# Patient Record
Sex: Female | Born: 1935 | Race: White | Hispanic: No | Smoking: Former smoker
Health system: Southern US, Community
[De-identification: ages and names within clinical notes are randomized; demographics above are authoritative.]

## PROBLEM LIST (undated history)

## (undated) DIAGNOSIS — T7840XA Allergy, unspecified, initial encounter: Secondary | ICD-10-CM

## (undated) DIAGNOSIS — M1990 Unspecified osteoarthritis, unspecified site: Secondary | ICD-10-CM

## (undated) DIAGNOSIS — H3530 Unspecified macular degeneration: Secondary | ICD-10-CM

## (undated) DIAGNOSIS — Z87440 Personal history of urinary (tract) infections: Secondary | ICD-10-CM

## (undated) DIAGNOSIS — I10 Essential (primary) hypertension: Secondary | ICD-10-CM

## (undated) DIAGNOSIS — Z8619 Personal history of other infectious and parasitic diseases: Secondary | ICD-10-CM

## (undated) DIAGNOSIS — E079 Disorder of thyroid, unspecified: Secondary | ICD-10-CM

## (undated) DIAGNOSIS — H409 Unspecified glaucoma: Secondary | ICD-10-CM

## (undated) DIAGNOSIS — M4800 Spinal stenosis, site unspecified: Secondary | ICD-10-CM

## (undated) HISTORY — DX: Unspecified osteoarthritis, unspecified site: M19.90

## (undated) HISTORY — DX: Personal history of other infectious and parasitic diseases: Z86.19

## (undated) HISTORY — DX: Personal history of urinary (tract) infections: Z87.440

## (undated) HISTORY — DX: Unspecified glaucoma: H40.9

## (undated) HISTORY — DX: Unspecified macular degeneration: H35.30

## (undated) HISTORY — DX: Disorder of thyroid, unspecified: E07.9

## (undated) HISTORY — DX: Allergy, unspecified, initial encounter: T78.40XA

## (undated) HISTORY — PX: BILATERAL CARPAL TUNNEL RELEASE: SHX6508

---

## 2011-01-12 HISTORY — PX: CATARACT EXTRACTION, BILATERAL: SHX1313

## 2014-06-24 ENCOUNTER — Encounter: Payer: Self-pay | Admitting: Podiatry

## 2014-06-24 ENCOUNTER — Ambulatory Visit (INDEPENDENT_AMBULATORY_CARE_PROVIDER_SITE_OTHER): Payer: PPO | Admitting: Podiatry

## 2014-06-24 ENCOUNTER — Ambulatory Visit (INDEPENDENT_AMBULATORY_CARE_PROVIDER_SITE_OTHER): Payer: PPO

## 2014-06-24 VITALS — BP 137/72 | HR 63 | Resp 16 | Ht 60.0 in | Wt 105.0 lb

## 2014-06-24 DIAGNOSIS — M79672 Pain in left foot: Secondary | ICD-10-CM | POA: Diagnosis not present

## 2014-06-24 DIAGNOSIS — G629 Polyneuropathy, unspecified: Secondary | ICD-10-CM | POA: Diagnosis not present

## 2014-06-24 NOTE — Progress Notes (Signed)
   Subjective:    Patient ID: Gloria Carr, female    DOB: [birth year], 81 y.o.   MRN: [id number] Pt is having discomfort that is on the bottom of her feet; pt said she feel like she is "walking in mud at times" she has insoles and they are not comfortable to wear- they came from her doc., back home.   , she has been seen by a podiatries before she moved , she is just getting settled in here , [place].    HPI    Review of Systems  HENT: Positive for sneezing and trouble swallowing.   Eyes: Positive for itching and visual disturbance.  Endocrine: Positive for cold intolerance and polyuria.  Genitourinary: Positive for urgency and frequency.  Allergic/Immunologic: Positive for environmental allergies.  Neurological: Positive for numbness.  Hematological: Bruises/bleeds easily.  All other systems reviewed and are negative.      Objective:   Physical Exam: I have reviewed her past medical history medications allergies surgery social history and review of systems. Pulses are palpable bilateral. Neurologic sensorium is slightly decreased per Semmes-Weinstein monofilament. Deep tendon reflexes and muscle strength are equal bilateral symmetrical. Orthopedic evaluation of strength on the systems of ankle range of motion without crepitation.        Assessment & Plan:  Assessment: Idiopathic neuropathy bilateral.  Plan: Discussed etiology and pathology conservative versus surgical therapies. Discussed the use of medical vitamins such as NeurRx. We'll follow-up with her as needed.

## 2014-06-25 DIAGNOSIS — M79673 Pain in unspecified foot: Secondary | ICD-10-CM

## 2014-07-16 ENCOUNTER — Encounter: Payer: Self-pay | Admitting: Internal Medicine

## 2014-07-16 ENCOUNTER — Ambulatory Visit (INDEPENDENT_AMBULATORY_CARE_PROVIDER_SITE_OTHER): Payer: PPO | Admitting: Internal Medicine

## 2014-07-16 VITALS — BP 128/61 | HR 59 | Temp 98.3°F | Ht 58.75 in | Wt 107.0 lb

## 2014-07-16 DIAGNOSIS — E034 Atrophy of thyroid (acquired): Secondary | ICD-10-CM | POA: Diagnosis not present

## 2014-07-16 DIAGNOSIS — E038 Other specified hypothyroidism: Secondary | ICD-10-CM | POA: Diagnosis not present

## 2014-07-16 DIAGNOSIS — E785 Hyperlipidemia, unspecified: Secondary | ICD-10-CM

## 2014-07-16 DIAGNOSIS — G609 Hereditary and idiopathic neuropathy, unspecified: Secondary | ICD-10-CM | POA: Insufficient documentation

## 2014-07-16 DIAGNOSIS — E039 Hypothyroidism, unspecified: Secondary | ICD-10-CM | POA: Insufficient documentation

## 2014-07-16 NOTE — Patient Instructions (Addendum)
We are checking your thyroid, B12 etc today and wil refilll your Synthroid and pravastatin once I see the results   I  recommend getting the majority of your calcium and Vitamin D  through diet rather than supplements given the recent association of calcium supplements with increased coronary artery calcium scores  Try the almond and cashew milks that most grocery stores  now carry  in the dairy  Section.    They are lactose free:  Silk brand Almond Light,  Original formula.  Delicious,  Low carb, Low cal,  Cholesterol free   I recommend trying the following protein drinks to supplement your diet :  Muscle Milk Premier Protein  Orvan Seen

## 2014-07-16 NOTE — Progress Notes (Signed)
Pre visit review using our clinic review tool, if applicable. No additional management support is needed unless otherwise documented below in the visit note. 

## 2014-07-16 NOTE — Progress Notes (Signed)
Subjective:  Patient ID: Gloria Carr, female    DOB: 1927/10/10  Age: 81 y.o. MRN: [id number]  CC: The primary encounter diagnosis was Hypothyroidism due to acquired atrophy of thyroid. Diagnoses of Hereditary and idiopathic peripheral neuropathy and Hyperlipidemia were also pertinent to this visit.  HPI Gloria Carr presents for new patient care  Cc:  insomnia now improved,   One nightttime void,  Some urgency associated with voiding but no incontinence.   She exercises regularly with line dancing, has been doing it for years,   Saw [facility] for idiopthic neuropathy which started August 2014.  Marland Kitchen  Feels like walking in mud.  Prior podiatry tried gabapentin with no improvement,   Hyatt prescirbed NeurRX vitmain    PMH:   History of several  spontaneous abortions.   Carried several to full time afterward   Hypothyroid for years  Cataract surgery with lenses bilaterally , followed by blurred vision left eye requiring laser surgery . Still blurry, now wearing prescription lenses    CTS release bilaterally, remotely  Right thumb surgery for painful arthritis , remotely.    Not sure which pneumonia shot was given,  Had zostavAX CAVVINE    hi  Relocated from [place].  2 months ago to [place],  [place] in [place]  Allergic rhinitis since relocation ,  Using clarinex and flonase  Has had eye exam with [place] at [facility]  Early macular degeneration bilaterally, glaucoma in left eye   History [name] has a past medical history of Arthritis; History of chickenpox; Glaucoma; Allergy; Thyroid disease; and History of recurrent UTIs.   She has past surgical history that includes Cataract extraction, bilateral (2013) and Bilateral carpal tunnel release (1999-2000).   Her family history is not on file.She reports that she has quit smoking. She has never used smokeless tobacco. She reports that she drinks about 3.0 oz of alcohol per week. Her drug history is not on  file.  Outpatient Prescriptions Prior to Visit  Medication Sig Dispense Refill  . Calcium Citrate (CITRACAL PO) Take by mouth.    . levothyroxine (SYNTHROID, LEVOTHROID) 75 MCG tablet Take 75 mcg by mouth daily before breakfast.    . Multiple Vitamins-Minerals (CENTRUM SILVER PO) Take by mouth.    . Multiple Vitamins-Minerals (PRESERVISION AREDS 2 PO) Take by mouth.    Vladimir Faster Glycol-Propyl Glycol (SYSTANE OP) Apply to eye.    . pravastatin (PRAVACHOL) 20 MG tablet Take 20 mg by mouth daily.     No facility-administered medications prior to visit.    Review of Systems:  Patient denies headache, fevers, malaise, unintentional weight loss, skin rash, eye pain, sinus congestion and sinus pain, sore throat, dysphagia,  hemoptysis , cough, dyspnea, wheezing, chest pain, palpitations, orthopnea, edema, abdominal pain, nausea, melena, diarrhea, constipation, flank pain, dysuria, hematuria, urinary  Frequency, nocturia, numbness, tingling, seizures,  Focal weakness, Loss of consciousness,  Tremor, insomnia, depression, anxiety, and suicidal ideation.     Objective:  BP 128/61 mmHg  Pulse 59  Temp(Src) 98.3 F (36.8 C) (Oral)  Ht 4' 10.75" (1.492 m)  Wt 107 lb (48.535 kg)  BMI 21.80 kg/m2  SpO2 95%  LMP  (LMP Unknown)  Physical Exam:  General appearance: alert, cooperative and appears stated age Ears: normal TM's and external ear canals both ears Throat: lips, mucosa, and tongue normal; teeth and gums normal Neck: no adenopathy, no carotid bruit, supple, symmetrical, trachea midline and thyroid not enlarged, symmetric, no tenderness/mass/nodules Back: symmetric, no curvature. ROM  normal. No CVA tenderness. Lungs: clear to auscultation bilaterally Heart: regular rate and rhythm, S1, S2 normal, no murmur, click, rub or gallop Abdomen: soft, non-tender; bowel sounds normal; no masses,  no organomegaly Pulses: 2+ and symmetric Skin: Skin color, texture, turgor normal. No rashes or  lesions Lymph nodes: Cervical, supraclavicular, and axillary nodes normal.  Assessment & Plan:   Problem List Items Addressed This Visit      Unprioritized   Hypothyroidism - Primary    Thyroid function is WNL on current dose.  No current changes needed.       Relevant Orders   TSH (Completed)   Hereditary and idiopathic peripheral neuropathy    No reversible causes identified by currnent labs. Continue current  management       Relevant Orders   Comprehensive metabolic panel (Completed)   Vitamin B12 (Completed)   RPR (Completed)   Hemoglobin A1c (Completed)   Hyperlipidemia    Managed with pravastatin.  Liver tests are normal.         A total of 45 minutes was spent with patient more than half of which was spent in counseling patient on the above mentioned issues , reviewing and explaining recent labs and imaging studies done, and coordination of care. I am having Ms. Nudd maintain her levothyroxine, pravastatin, Multiple Vitamins-Minerals (PRESERVISION AREDS 2 PO), Polyethyl Glycol-Propyl Glycol (SYSTANE OP), Multiple Vitamins-Minerals (CENTRUM SILVER PO), Calcium Citrate (CITRACAL PO), Misc Natural Products (OSTEO BI-FLEX JOINT SHIELD PO), loratadine, fluticasone, MISC NATURAL PRODUCTS PO, and latanoprost.  Meds ordered this encounter  Medications  . Misc Natural Products (OSTEO BI-FLEX JOINT SHIELD PO)    Sig: Take by mouth.  . loratadine (CLARITIN) 10 MG tablet    Sig: Take 10 mg by mouth daily.  . fluticasone (FLONASE) 50 MCG/ACT nasal spray    Sig: Place into both nostrils daily.  Marland Kitchen MISC NATURAL PRODUCTS PO    Sig: Take by mouth. Peripheral nerve health  . latanoprost (XALATAN) 0.005 % ophthalmic solution    Sig: INSTILL 1 DROP IN THE LEFT EYE AT NIGHT    Refill:  5    There are no discontinued medications.  Follow-up: No Follow-up on file.   Crecencio Mc, MD

## 2014-07-17 LAB — COMPREHENSIVE METABOLIC PANEL
ALT: 17 U/L (ref 0–35)
AST: 23 U/L (ref 0–37)
Albumin: 3.8 g/dL (ref 3.5–5.2)
Alkaline Phosphatase: 65 U/L (ref 39–117)
BUN: 14 mg/dL (ref 6–23)
CALCIUM: 9.5 mg/dL (ref 8.4–10.5)
CO2: 29 mEq/L (ref 19–32)
Chloride: 103 mEq/L (ref 96–112)
Creatinine, Ser: 0.8 mg/dL (ref 0.40–1.20)
GFR: 72.41 mL/min (ref >60.00)
Glucose, Bld: 82 mg/dL (ref 70–99)
Potassium: 4 mEq/L (ref 3.5–5.1)
SODIUM: 139 meq/L (ref 135–145)
Total Bilirubin: 0.5 mg/dL (ref 0.2–1.2)
Total Protein: 7.1 g/dL (ref 6.0–8.3)

## 2014-07-17 LAB — VITAMIN B12: Vitamin B-12: >1500 pg/mL — ABNORMAL HIGH (ref 211–911)

## 2014-07-17 LAB — TSH: TSH: 0.69 u[IU]/mL (ref 0.35–4.50)

## 2014-07-17 LAB — RPR

## 2014-07-17 LAB — HEMOGLOBIN A1C: Hgb A1c MFr Bld: 5.7 % (ref 4.6–6.5)

## 2014-07-18 ENCOUNTER — Encounter: Payer: Self-pay | Admitting: Internal Medicine

## 2014-07-18 NOTE — Assessment & Plan Note (Signed)
Thyroid function is WNL on current dose.  No current changes needed.  

## 2014-07-18 NOTE — Assessment & Plan Note (Signed)
No reversible causes identified by currnent labs. Continue current  management

## 2014-07-18 NOTE — Assessment & Plan Note (Signed)
Managed with pravastatin.  Liver tests are normal.

## 2014-07-19 ENCOUNTER — Telehealth: Payer: Self-pay | Admitting: *Deleted

## 2014-07-19 MED ORDER — LEVOTHYROXINE SODIUM 75 MCG PO TABS: 75.0000 ug | ORAL_TABLET | Freq: Every day | ORAL | Status: DC

## 2014-07-19 MED ORDER — PRAVASTATIN SODIUM 20 MG PO TABS: 20.0000 mg | ORAL_TABLET | Freq: Every day | ORAL | Status: DC

## 2014-07-19 NOTE — Telephone Encounter (Signed)
I spoke with the pt, she was did not know what medications were to be refilled.  She just thought she was told that two were to be sent to her pharmacy.

## 2014-07-19 NOTE — Telephone Encounter (Signed)
Pt called states she was to believe she was to have 2 Rxs sent to [facility] after her appoint on 7.5.16.  Review of chart I dont see any Rxs sent.  Please advise

## 2014-07-19 NOTE — Telephone Encounter (Signed)
In the future,  It would save me a lot of time if you would ask the patient which medications she was expecting.  I have refilled the levothyroxine and the pravastatin .  You will need to confirm that those were the ones she was expecting.   Thanks

## 2014-08-02 NOTE — Telephone Encounter (Signed)
Maid unread results to patient

## 2014-11-27 ENCOUNTER — Telehealth: Payer: Self-pay | Admitting: Internal Medicine

## 2014-11-27 NOTE — Telephone Encounter (Signed)
Left msg for pt to call office to schedule AWV.msn °

## 2014-12-10 ENCOUNTER — Ambulatory Visit (INDEPENDENT_AMBULATORY_CARE_PROVIDER_SITE_OTHER): Payer: PPO

## 2014-12-10 VITALS — BP 128/68 | HR 68 | Temp 98.0°F | Resp 14 | Ht 58.5 in | Wt 110.2 lb

## 2014-12-10 DIAGNOSIS — Z0000 Encounter for general adult medical examination without abnormal findings: Secondary | ICD-10-CM

## 2014-12-10 NOTE — Progress Notes (Signed)
  I have reviewed the above information and agree with above.   Strummer Canipe, MD 

## 2014-12-10 NOTE — Patient Instructions (Addendum)
Gloria Carr,  Thank you for taking time to come for your Medicare Wellness Visit.  I appreciate your ongoing commitment to your health goals. Please review the following plan we discussed and let me know if I can assist you in the future.  Bring a copy of Advanced Directives  Return in January for 6 month follow up Fall Prevention in the [facility] can cause injuries. They can happen to people of all ages. There are many things you can do to make your home safe and to help prevent falls.  WHAT CAN I DO ON THE OUTSIDE OF MY HOME?  Regularly fix the edges of walkways and driveways and fix any cracks.  Remove anything that might make you trip as you walk through a door, such as a raised step or threshold.  Trim any bushes or trees on the path to your home.  Use bright outdoor lighting.  Clear any walking paths of anything that might make someone trip, such as rocks or tools.  Regularly check to see if handrails are loose or broken. Make sure that both sides of any steps have handrails.  Any raised decks and porches should have guardrails on the edges.  Have any leaves, snow, or ice cleared regularly.  Use sand or salt on walking paths during winter.  Clean up any spills in your garage right away. This includes oil or grease spills. WHAT CAN I DO IN THE BATHROOM?   Use night lights.  Install grab bars by the toilet and in the tub and shower. Do not use towel bars as grab bars.  Use non-skid mats or decals in the tub or shower.  If you need to sit down in the shower, use a plastic, non-slip stool.  Keep the floor dry. Clean up any water that spills on the floor as soon as it happens.  Remove soap buildup in the tub or shower regularly.  Attach bath mats securely with double-sided non-slip rug tape.  Do not have throw rugs and other things on the floor that can make you trip. WHAT CAN I DO IN THE BEDROOM?  Use night lights.  Make sure that you have a light by your bed that  is easy to reach.  Do not use any sheets or blankets that are too big for your bed. They should not hang down onto the floor.  Have a firm chair that has side arms. You can use this for support while you get dressed.  Do not have throw rugs and other things on the floor that can make you trip. WHAT CAN I DO IN THE KITCHEN?  Clean up any spills right away.  Avoid walking on wet floors.  Keep items that you use a lot in easy-to-reach places.  If you need to reach something above you, use a strong step stool that has a grab bar.  Keep electrical cords out of the way.  Do not use floor polish or wax that makes floors slippery. If you must use wax, use non-skid floor wax.  Do not have throw rugs and other things on the floor that can make you trip. WHAT CAN I DO WITH MY STAIRS?  Do not leave any items on the stairs.  Make sure that there are handrails on both sides of the stairs and use them. Fix handrails that are broken or loose. Make sure that handrails are as long as the stairways.  Check any carpeting to make sure that it is firmly  attached to the stairs. Fix any carpet that is loose or worn.  Avoid having throw rugs at the top or bottom of the stairs. If you do have throw rugs, attach them to the floor with carpet tape.  Make sure that you have a light switch at the top of the stairs and the bottom of the stairs. If you do not have them, ask someone to add them for you. WHAT ELSE CAN I DO TO HELP PREVENT FALLS?  Wear shoes that:  Do not have high heels.  Have rubber bottoms.  Are comfortable and fit you well.  Are closed at the toe. Do not wear sandals.  If you use a stepladder:  Make sure that it is fully opened. Do not climb a closed stepladder.  Make sure that both sides of the stepladder are locked into place.  Ask someone to hold it for you, if possible.  Clearly mark and make sure that you can see:  Any grab bars or handrails.  First and last  steps.  Where the edge of each step is.  Use tools that help you move around (mobility aids) if they are needed. These include:  Canes.  Walkers.  Scooters.  Crutches.  Turn on the lights when you go into a dark area. Replace any light bulbs as soon as they burn out.  Set up your furniture so you have a clear path. Avoid moving your furniture around.  If any of your floors are uneven, fix them.  If there are any pets around you, be aware of where they are.  Review your medicines with your doctor. Some medicines can make you feel dizzy. This can increase your chance of falling. Ask your doctor what other things that you can do to help prevent falls.   This information is not intended to replace advice given to you by your health care provider. Make sure you discuss any questions you have with your health care provider.   Document Released: 10/24/2008 Document Revised: 05/14/2014 Document Reviewed: 02/01/2014 Elsevier Interactive Patient Education 2016 Elsevier Inc.  Glaucoma Glaucoma happens when the fluid pressure in the eyeball is too high. If the pressure stays high for too long, the eye may become damaged. This can cause a loss of vision. The most common type of glaucoma causes pressure in the eye to go up slowly. There may be no symptoms at first. Testing for this condition can help to find the condition before damage occurs. Early treatment can often stop vision loss. HOME CARE  Take medicines only as told by your doctor.  Use your eye drops exactly as told. You will probably need to use these for the rest of your life.  Exercise often. Talk with your doctor about which types of exercise are safe for you. Avoid standing on your head.  Keep all follow-up visits as told by your doctor. This is important. GET HELP IF:  Your symptoms get worse. GET HELP RIGHT AWAY IF:  You have bad pain in your eye.  You have vision problems.  You have a bad headache in the area  around your eye.  You feel sick to your stomach (nauseous) or you throw up (vomit).  You start to have problems with your other eye.   This information is not intended to replace advice given to you by your health care provider. Make sure you discuss any questions you have with your health care provider.   Document Released: 10/07/2007 Document Revised: 01/18/2014 Document  Reviewed: 10/09/2013 Elsevier Interactive Patient Education [employer].

## 2014-12-10 NOTE — Progress Notes (Signed)
Subjective:   Gloria Carr is a 81 y.o. female who presents for an Initial Medicare Annual Wellness Visit.  Review of Systems    No ROS.  Medicare Wellness Visit.   Cardiac Risk Factors include: advanced age (>22men, >43 women)     Objective:    Today's Vitals   12/10/14 1332  BP: 128/68  Pulse: 68  Temp: 98 F (36.7 C)  TempSrc: Oral  Resp: 14  Height: 4' 10.5" (1.486 m)  Weight: 110 lb 3.2 oz (49.986 kg)  SpO2: 97%    Current Medications (verified) Outpatient Encounter Prescriptions as of 12/10/2014  Medication Sig  . latanoprost (XALATAN) 0.005 % ophthalmic solution INSTILL 1 DROP IN THE LEFT EYE AT NIGHT  . levothyroxine (SYNTHROID, LEVOTHROID) 75 MCG tablet Take 1 tablet (75 mcg total) by mouth daily before breakfast.  . Misc Natural Products (OSTEO BI-FLEX JOINT SHIELD PO) Take by mouth.  . Multiple Vitamins-Minerals (CENTRUM SILVER PO) Take by mouth.  . Multiple Vitamins-Minerals (PRESERVISION AREDS 2 PO) Take by mouth.  Gloria Carr Glycol-Propyl Glycol (SYSTANE OP) Apply to eye.  . pravastatin (PRAVACHOL) 20 MG tablet Take 1 tablet (20 mg total) by mouth daily.  . Calcium Citrate (CITRACAL PO) Take by mouth.  . fluticasone (FLONASE) 50 MCG/ACT nasal spray Place into both nostrils daily.  Marland Kitchen loratadine (CLARITIN) 10 MG tablet Take 10 mg by mouth daily.  Marland Kitchen MISC NATURAL PRODUCTS PO Take by mouth. Peripheral nerve health   No facility-administered encounter medications on file as of 12/10/2014.    Allergies (verified) Sulfa antibiotics   History: Past Medical History  Diagnosis Date  . Arthritis   . History of chickenpox   . Glaucoma   . Allergy   . Thyroid disease   . History of recurrent UTIs    Past Surgical History  Procedure Laterality Date  . Cataract extraction, bilateral  2013  . Bilateral carpal tunnel release  1999-2000   Family History  Problem Relation Age of Onset  . Stroke Mother   . Cancer Mother     colon  . Cancer Father    bone  . Cancer Son     Jaw   Social History   Occupational History  . Not on file.   Social History Main Topics  . Smoking status: Former Research scientist (life sciences)  . Smokeless tobacco: Never Used  . Alcohol Use: 3.0 oz/week    5 Standard drinks or equivalent per week     Comment: with dinner  . Drug Use: No  . Sexual Activity: No    Tobacco Counseling Counseling given: Not Answered   Activities of Daily Living In your present state of health, do you have any difficulty performing the following activities: 12/10/2014  Hearing? N  Vision? N  Difficulty concentrating or making decisions? N  Walking or climbing stairs? N  Dressing or bathing? N  Doing errands, shopping? N  Preparing Food and eating ? N  Using the Toilet? N  In the past six months, have you accidently leaked urine? N  Do you have problems with loss of bowel control? N  Managing your Medications? N  Managing your Finances? N  Housekeeping or managing your Housekeeping? N    Immunizations and Health Maintenance Immunization History  Administered Date(s) Administered  . Influenza-Unspecified 09/12/2014  . Zoster 07/15/2012   There are no preventive care reminders to display for this patient.  Patient Care Team: Crecencio Mc, MD as PCP - General (Internal Medicine)  Indicate any  recent Medical Services you may have received from other than [facility] providers in the past year (date may be approximate).     Assessment:   This is a routine wellness examination for Gloria Carr.  The goal of the wellness visit is to assist the patient how to close the gaps in care and create a preventative care plan for the patient.   Bone Density/Risk for Osteoporosis discussed/taking Calcium as appropriate.  Taking meds without issues; no barriers identified.   Safety issues reviewed; smoke detectors in the home. No firearms in the home. Wears seatbelts when driving or riding with others. No violence in the home.  The patient was  oriented x 3; appropriate in dress and manner and no objective failures at ADL's or IADL's.   DEXA Scan and Pneumococcal vaccine,  TDAP vaccine postponed until follow up, per patient request.  Patient Concerns:  Frequent bowel movements, onset 2 months ago; soft and formed with mucus at times.  Bilateral foot issues; denies pain.  She states, "It feels like balled up socks under my toes and ball of feet."  Hx of peripheral neuropathy.  Deferred to PCP for follow up.  Appointment scheduled.  Hearing/Vision screen Hearing Screening Comments: Passes the whisper test Vision Screening Comments: Followed by Dr. Mordecai Rasmussen  Last OV 07/2014 Macular Degeneration Left eye glaucoma Bilateral cataracts removed Wears glasses  Dietary issues and exercise activities discussed: Current Exercise Habits:: Home exercise routine (Line dancing. Chair exercises. ), Time (Minutes): 60, Frequency (Times/Week): 3, Weekly Exercise (Minutes/Week): 180, Intensity: Moderate  Goals    . Increase water intake     Currently drinks sips of water.  Patient centered goal is start drinking 2 cups =1 bottle water daily.      Depression Screen PHQ 2/9 Scores 12/10/2014  PHQ - 2 Score 0    Fall Risk Fall Risk  12/10/2014  Falls in the past year? Yes  Injury with Fall? No    Cognitive Function: MMSE - Mini Mental State Exam 12/10/2014  Orientation to time 5  Orientation to Place 5  Registration 3  Attention/ Calculation 5  Recall 3  Language- name 2 objects 2  Language- repeat 1  Language- follow 3 step command 3  Language- read & follow direction 1  Write a sentence 1  Copy design 1  Total score 30    Screening Tests Health Maintenance  Topic Date Due  . DEXA SCAN  01/17/2015 (Originally 04/13/1994)  . TETANUS/TDAP  01/17/2015 (Originally 04/12/1946)  . PNA vac Low Risk Adult (1 of 2 - PCV13) 01/17/2015 (Originally 04/13/1994)  . INFLUENZA VACCINE  08/12/2015  . ZOSTAVAX  Completed      Plan:    End  of life planning; Advance aging; Advanced directives discussed. Copy requested of current Living Will.  Return in January for scheduled 6 month follow up.  During the course of the visit, Gloria Carr was educated and counseled about the following appropriate screening and preventive services:   Vaccines to include Pneumoccal, Influenza, Hepatitis B, Td, Zostavax, HCV  Electrocardiogram  Cardiovascular disease screening  Colorectal cancer screening  Bone density screening  Diabetes screening  Glaucoma screening  Mammography/PAP  Nutrition counseling  Smoking cessation counseling  Patient Instructions (the written plan) were given to the patient.    Varney Biles, LPN   [phone]

## 2015-01-17 ENCOUNTER — Ambulatory Visit (INDEPENDENT_AMBULATORY_CARE_PROVIDER_SITE_OTHER): Payer: PPO | Admitting: Internal Medicine

## 2015-01-17 ENCOUNTER — Encounter: Payer: Self-pay | Admitting: Internal Medicine

## 2015-01-17 VITALS — BP 144/78 | HR 60 | Temp 97.6°F | Resp 12 | Ht 59.0 in | Wt 108.2 lb

## 2015-01-17 DIAGNOSIS — R5383 Other fatigue: Secondary | ICD-10-CM

## 2015-01-17 DIAGNOSIS — F959 Tic disorder, unspecified: Secondary | ICD-10-CM

## 2015-01-17 DIAGNOSIS — E559 Vitamin D deficiency, unspecified: Secondary | ICD-10-CM | POA: Diagnosis not present

## 2015-01-17 DIAGNOSIS — E785 Hyperlipidemia, unspecified: Secondary | ICD-10-CM | POA: Diagnosis not present

## 2015-01-17 DIAGNOSIS — Z1159 Encounter for screening for other viral diseases: Secondary | ICD-10-CM | POA: Diagnosis not present

## 2015-01-17 DIAGNOSIS — E038 Other specified hypothyroidism: Secondary | ICD-10-CM

## 2015-01-17 DIAGNOSIS — Z23 Encounter for immunization: Secondary | ICD-10-CM | POA: Diagnosis not present

## 2015-01-17 DIAGNOSIS — Z0000 Encounter for general adult medical examination without abnormal findings: Secondary | ICD-10-CM

## 2015-01-17 DIAGNOSIS — E034 Atrophy of thyroid (acquired): Secondary | ICD-10-CM

## 2015-01-17 DIAGNOSIS — Z1239 Encounter for other screening for malignant neoplasm of breast: Secondary | ICD-10-CM

## 2015-01-17 LAB — COMPREHENSIVE METABOLIC PANEL
ALT: 14 U/L (ref 0–35)
AST: 21 U/L (ref 0–37)
Albumin: 4 g/dL (ref 3.5–5.2)
Alkaline Phosphatase: 74 U/L (ref 39–117)
BUN: 12 mg/dL (ref 6–23)
CALCIUM: 10 mg/dL (ref 8.4–10.5)
CO2: 33 mEq/L — ABNORMAL HIGH (ref 19–32)
Chloride: 101 mEq/L (ref 96–112)
Creatinine, Ser: 0.73 mg/dL (ref 0.40–1.20)
GFR: 80.39 mL/min (ref >60.00)
GLUCOSE: 89 mg/dL (ref 70–99)
POTASSIUM: 4 meq/L (ref 3.5–5.1)
Sodium: 140 mEq/L (ref 135–145)
TOTAL PROTEIN: 7 g/dL (ref 6.0–8.3)
Total Bilirubin: 0.7 mg/dL (ref 0.2–1.2)

## 2015-01-17 LAB — CBC WITH DIFFERENTIAL/PLATELET
Basophils Absolute: 0.1 10*3/uL (ref 0.0–0.1)
Basophils Relative: 0.6 % (ref 0.0–3.0)
EOS PCT: 1.1 % (ref 0.0–5.0)
Eosinophils Absolute: 0.1 10*3/uL (ref 0.0–0.7)
HCT: 45.9 % (ref 36.0–46.0)
HEMOGLOBIN: 15.1 g/dL — AB (ref 12.0–15.0)
Lymphocytes Relative: 32.4 % (ref 12.0–46.0)
Lymphs Abs: 2.9 10*3/uL (ref 0.7–4.0)
MCHC: 32.9 g/dL (ref 30.0–36.0)
MCV: 90.3 fl (ref 78.0–100.0)
MONOS PCT: 8.1 % (ref 3.0–12.0)
Monocytes Absolute: 0.7 10*3/uL (ref 0.1–1.0)
NEUTROS ABS: 5.1 10*3/uL (ref 1.4–7.7)
NEUTROS PCT: 57.8 % (ref 43.0–77.0)
PLATELETS: 275 10*3/uL (ref 150.0–400.0)
RBC: 5.08 Mil/uL (ref 3.87–5.11)
RDW: 12.8 % (ref 11.5–15.5)
WBC: 8.8 10*3/uL (ref 4.0–10.5)

## 2015-01-17 LAB — LIPID PANEL
Cholesterol: 193 mg/dL (ref 0–200)
HDL: 81.4 mg/dL (ref >39.00)
LDL Cholesterol: 93 mg/dL (ref 0–99)
NonHDL: 111.1
TRIGLYCERIDES: 89 mg/dL (ref 0.0–149.0)
Total CHOL/HDL Ratio: 2
VLDL: 17.8 mg/dL (ref 0.0–40.0)

## 2015-01-17 LAB — VITAMIN D 25 HYDROXY (VIT D DEFICIENCY, FRACTURES): VITD: 32.17 ng/mL (ref 30.00–100.00)

## 2015-01-17 LAB — TSH: TSH: 1.06 u[IU]/mL (ref 0.35–4.50)

## 2015-01-17 NOTE — Progress Notes (Signed)
Pre-visit discussion using our clinic review tool. No additional management support is needed unless otherwise documented below in the visit note.  

## 2015-01-17 NOTE — Patient Instructions (Signed)
I am referring you for a mammogram  I will request records from your previous doctor (again)   recommend getting the majority of your calcium and Vitamin D  through diet rather than supplements given the recent association of calcium supplements with increased coronary artery calcium scores  Try the almond and cashew milks that most grocery stores  now carry  in the dairy  Section>   They are lactose free:  Silk brand Almond Light,  Original formula.  Delicious,  Low carb,  Low cal,  Cholesterol free   Menopause is a normal process in which your reproductive ability comes to an end. This process happens gradually over a span of months to years, usually between the ages of 62 and 87. Menopause is complete when you have missed 12 consecutive menstrual periods. It is important to talk with your health care provider about some of the most common conditions that affect postmenopausal women, such as heart disease, cancer, and bone loss (osteoporosis). Adopting a healthy lifestyle and getting preventive care can help to promote your health and wellness. Those actions can also lower your chances of developing some of these common conditions. WHAT SHOULD I KNOW ABOUT MENOPAUSE? During menopause, you may experience a number of symptoms, such as:  Moderate-to-severe hot flashes.  Night sweats.  Decrease in sex drive.  Mood swings.  Headaches.  Tiredness.  Irritability.  Memory problems.  Insomnia. Choosing to treat or not to treat menopausal changes is an individual decision that you make with your health care provider. WHAT SHOULD I KNOW ABOUT HORMONE REPLACEMENT THERAPY AND SUPPLEMENTS? Hormone therapy products are effective for treating symptoms that are associated with menopause, such as hot flashes and night sweats. Hormone replacement carries certain risks, especially as you become older. If you are thinking about using estrogen or estrogen with progestin treatments, discuss the benefits  and risks with your health care provider. WHAT SHOULD I KNOW ABOUT HEART DISEASE AND STROKE? Heart disease, heart attack, and stroke become more likely as you age. This may be due, in part, to the hormonal changes that your body experiences during menopause. These can affect how your body processes dietary fats, triglycerides, and cholesterol. Heart attack and stroke are both medical emergencies. There are many things that you can do to help prevent heart disease and stroke:  Have your blood pressure checked at least every 1-2 years. High blood pressure causes heart disease and increases the risk of stroke.  If you are 78-4 years old, ask your health care provider if you should take aspirin to prevent a heart attack or a stroke.  Do not use any tobacco products, including cigarettes, chewing tobacco, or electronic cigarettes. If you need help quitting, ask your health care provider.  It is important to eat a healthy diet and maintain a healthy weight.  Be sure to include plenty of vegetables, fruits, low-fat dairy products, and lean protein.  Avoid eating foods that are high in solid fats, added sugars, or salt (sodium).  Get regular exercise. This is one of the most important things that you can do for your health.  Try to exercise for at least 150 minutes each week. The type of exercise that you do should increase your heart rate and make you sweat. This is known as moderate-intensity exercise.  Try to do strengthening exercises at least twice each week. Do these in addition to the moderate-intensity exercise.  Know your numbers.Ask your health care provider to check your cholesterol and your blood  glucose. Continue to have your blood tested as directed by your health care provider. WHAT SHOULD I KNOW ABOUT CANCER SCREENING? There are several types of cancer. Take the following steps to reduce your risk and to catch any cancer development as early as possible. Breast Cancer  Practice  breast self-awareness.  This means understanding how your breasts normally appear and feel.  It also means doing regular breast self-exams. Let your health care provider know about any changes, no matter how small.  If you are 41 or older, have a clinician do a breast exam (clinical breast exam or CBE) every year. Depending on your age, family history, and medical history, it may be recommended that you also have a yearly breast X-ray (mammogram).  If you have a family history of breast cancer, talk with your health care provider about genetic screening.  If you are at high risk for breast cancer, talk with your health care provider about having an MRI and a mammogram every year.  Breast cancer (BRCA) gene test is recommended for women who have family members with BRCA-related cancers. Results of the assessment will determine the need for genetic counseling and BRCA1 and for BRCA2 testing. BRCA-related cancers include these types:  Breast. This occurs in males or females.  Ovarian.  Tubal. This may also be called fallopian tube cancer.  Cancer of the abdominal or pelvic lining (peritoneal cancer).  Prostate.  Pancreatic. Cervical, Uterine, and Ovarian Cancer Your health care provider may recommend that you be screened regularly for cancer of the pelvic organs. These include your ovaries, uterus, and vagina. This screening involves a pelvic exam, which includes checking for microscopic changes to the surface of your cervix (Pap test).  For women ages 21-65, health care providers may recommend a pelvic exam and a Pap test every three years. For women ages 76-65, they may recommend the Pap test and pelvic exam, combined with testing for human papilloma virus (HPV), every five years. Some types of HPV increase your risk of cervical cancer. Testing for HPV may also be done on women of any age who have unclear Pap test results.  Other health care providers may not recommend any screening for  nonpregnant women who are considered low risk for pelvic cancer and have no symptoms. Ask your health care provider if a screening pelvic exam is right for you.  If you have had past treatment for cervical cancer or a condition that could lead to cancer, you need Pap tests and screening for cancer for at least 20 years after your treatment. If Pap tests have been discontinued for you, your risk factors (such as having a new sexual partner) need to be reassessed to determine if you should start having screenings again. Some women have medical problems that increase the chance of getting cervical cancer. In these cases, your health care provider may recommend that you have screening and Pap tests more often.  If you have a family history of uterine cancer or ovarian cancer, talk with your health care provider about genetic screening.  If you have vaginal bleeding after reaching menopause, tell your health care provider.  There are currently no reliable tests available to screen for ovarian cancer. Lung Cancer Lung cancer screening is recommended for adults 72-57 years old who are at high risk for lung cancer because of a history of smoking. A yearly low-dose CT scan of the lungs is recommended if you:  Currently smoke.  Have a history of at least 30 pack-years  of smoking and you currently smoke or have quit within the past 15 years. A pack-year is smoking an average of one pack of cigarettes per day for one year. Yearly screening should:  Continue until it has been 15 years since you quit.  Stop if you develop a health problem that would prevent you from having lung cancer treatment. Colorectal Cancer  This type of cancer can be detected and can often be prevented.  Routine colorectal cancer screening usually begins at age 50 and continues through age 86.  If you have risk factors for colon cancer, your health care provider may recommend that you be screened at an earlier age.  If you have  a family history of colorectal cancer, talk with your health care provider about genetic screening.  Your health care provider may also recommend using home test kits to check for hidden blood in your stool.  A small camera at the end of a tube can be used to examine your colon directly (sigmoidoscopy or colonoscopy). This is done to check for the earliest forms of colorectal cancer.  Direct examination of the colon should be repeated every 5-10 years until age 20. However, if early forms of precancerous polyps or small growths are found or if you have a family history or genetic risk for colorectal cancer, you may need to be screened more often. Skin Cancer  Check your skin from head to toe regularly.  Monitor any moles. Be sure to tell your health care provider:  About any new moles or changes in moles, especially if there is a change in a mole's shape or color.  If you have a mole that is larger than the size of a pencil eraser.  If any of your family members has a history of skin cancer, especially at a young age, talk with your health care provider about genetic screening.  Always use sunscreen. Apply sunscreen liberally and repeatedly throughout the day.  Whenever you are outside, protect yourself by wearing long sleeves, pants, a wide-brimmed hat, and sunglasses. WHAT SHOULD I KNOW ABOUT OSTEOPOROSIS? Osteoporosis is a condition in which bone destruction happens more quickly than new bone creation. After menopause, you may be at an increased risk for osteoporosis. To help prevent osteoporosis or the bone fractures that can happen because of osteoporosis, the following is recommended:  If you are 42-8 years old, get at least 1,000 mg of calcium and at least 600 mg of vitamin D per day.  If you are older than age 27 but younger than age 22, get at least 1,200 mg of calcium and at least 600 mg of vitamin D per day.  If you are older than age 42, get at least 1,200 mg of calcium and  at least 800 mg of vitamin D per day. Smoking and excessive alcohol intake increase the risk of osteoporosis. Eat foods that are rich in calcium and vitamin D, and do weight-bearing exercises several times each week as directed by your health care provider. WHAT SHOULD I KNOW ABOUT HOW MENOPAUSE AFFECTS [place]? Depression may occur at any age, but it is more common as you become older. Common symptoms of depression include:  Low or sad mood.  Changes in sleep patterns.  Changes in appetite or eating patterns.  Feeling an overall lack of motivation or enjoyment of activities that you previously enjoyed.  Frequent crying spells. Talk with your health care provider if you think that you are experiencing depression. WHAT SHOULD I  KNOW ABOUT IMMUNIZATIONS? It is important that you get and maintain your immunizations. These include:  Tetanus, diphtheria, and pertussis (Tdap) booster vaccine.  Influenza every year before the flu season begins.  Pneumonia vaccine.  Shingles vaccine. Your health care provider may also recommend other immunizations.   This information is not intended to replace advice given to you by your health care provider. Make sure you discuss any questions you have with your health care provider.   Document Released: 02/19/2005 Document Revised: 01/18/2014 Document Reviewed: 08/30/2013 Elsevier Interactive Patient Education [employer].

## 2015-01-17 NOTE — Progress Notes (Signed)
Patient ID: Gloria Carr, female    DOB: 08-30-1927  Age: 82 y.o. MRN: [id number]  The patient is here for annual Medicare wellness examination and management of other chronic and acute problems.   The risk factors are reflected in the social history.  The roster of all physicians providing medical care to patient - is listed in the Snapshot section of the chart.  Activities of daily living:  The patient is 100% independent in all ADLs: dressing, toileting, feeding as well as independent mobility  Home safety : The patient has smoke detectors in the home. They wear seatbelts.  There are no firearms at home. There is no violence in the home.   There is no risks for hepatitis, STDs or HIV. There is no   history of blood transfusion. They have no travel history to infectious disease endemic areas of the world.  The patient has seen their dentist in the last six month. They have seen their eye doctor in the last year. They admit to slight hearing difficulty with regard to whispered voices and some television programs.  They have deferred audiologic testing in the last year.  They do not  have excessive sun exposure. Discussed the need for sun protection: hats, long sleeves and use of sunscreen if there is significant sun exposure.   Diet: the importance of a healthy diet is discussed. They do have a healthy diet.  The benefits of regular aerobic exercise were discussed. She walks 4 times per week ,  20 minutes.   Depression screen: there are no signs or vegative symptoms of depression- irritability, change in appetite, anhedonia, sadness/tearfullness.  Cognitive assessment: the patient manages all their financial and personal affairs and is actively engaged. They could relate day,date,year and events; recalled 2/3 objects at 3 minutes; performed clock-face test normally.  The following portions of the patient's history were reviewed and updated as appropriate: allergies, current medications, past  family history, past medical history,  past surgical history, past social history  and problem list.  Visual acuity was not assessed per patient preference since she has regular follow up with her ophthalmologist. Hearing and body mass index were assessed and reviewed.   During the course of the visit the patient was educated and counseled about appropriate screening and preventive services including : fall prevention , diabetes screening, nutrition counseling, colorectal cancer screening, and recommended immunizations.    CC: The primary encounter diagnosis was Breast cancer screening. Diagnoses of Need for hepatitis C screening test, Vitamin D deficiency, Hyperlipidemia, Other fatigue, Hypothyroidism due to acquired atrophy of thyroid, Need for prophylactic vaccination against Streptococcus pneumoniae (pneumococcus), Visit for preventive health examination, and Facial tic were also pertinent to this visit.   Having a recurrent involuntary lip maneuver that is making her lips sore . described as a puttering where she uses lips and blows out a bubble of air. Had a bridge on both sides ,  now wearing a partial on the bottom . Top is capped. .  Needs referral to dentist  Bowel moving normally One nocturnal void per night    Uses Biotin for dry mouth   History [place] has a past medical history of Arthritis; History of chickenpox; Glaucoma; Allergy; Thyroid disease; and History of recurrent UTIs.   She has past surgical history that includes Cataract extraction, bilateral (2013) and Bilateral carpal tunnel release (1999-2000).   Her family history includes Cancer in her father, mother, and son; Stroke in her mother.She reports that she has quit  smoking. She has never used smokeless tobacco. She reports that she drinks about 3.0 oz of alcohol per week. She reports that she does not use illicit drugs.  Outpatient Prescriptions Prior to Visit  Medication Sig Dispense Refill  . latanoprost  (XALATAN) 0.005 % ophthalmic solution INSTILL 1 DROP IN THE LEFT EYE AT NIGHT  5  . levothyroxine (SYNTHROID, LEVOTHROID) 75 MCG tablet Take 1 tablet (75 mcg total) by mouth daily before breakfast. 90 tablet 1  . Misc Natural Products (OSTEO BI-FLEX JOINT SHIELD PO) Take by mouth.    Marland Kitchen MISC NATURAL PRODUCTS PO Take by mouth. Peripheral nerve health    . Multiple Vitamins-Minerals (CENTRUM SILVER PO) Take by mouth.    . Multiple Vitamins-Minerals (PRESERVISION AREDS 2 PO) Take by mouth.    Vladimir Faster Glycol-Propyl Glycol (SYSTANE OP) Apply to eye.    . pravastatin (PRAVACHOL) 20 MG tablet Take 1 tablet (20 mg total) by mouth daily. 90 tablet 1  . Calcium Citrate (CITRACAL PO) Take by mouth. Reported on 01/17/2015    . fluticasone (FLONASE) 50 MCG/ACT nasal spray Place into both nostrils daily. Reported on 01/17/2015    . loratadine (CLARITIN) 10 MG tablet Take 10 mg by mouth daily. Reported on 01/17/2015     No facility-administered medications prior to visit.    Review of Systems   Patient denies headache, fevers, malaise, unintentional weight loss, skin rash, eye pain, sinus congestion and sinus pain, sore throat, dysphagia,  hemoptysis , cough, dyspnea, wheezing, chest pain, palpitations, orthopnea, edema, abdominal pain, nausea, melena, diarrhea, constipation, flank pain, dysuria, hematuria, urinary  Frequency, nocturia, numbness, tingling, seizures,  Focal weakness, Loss of consciousness,  Tremor, insomnia, depression, anxiety, and suicidal ideation.     Objective:  BP 144/78 mmHg  Pulse 60  Temp(Src) 97.6 F (36.4 C) (Oral)  Resp 12  Ht 4\' 11"  (1.499 m)  Wt 108 lb 4 oz (49.102 kg)  BMI 21.85 kg/m2  SpO2 97%  LMP  (LMP Unknown)  Physical Exam   General appearance: alert, cooperative and appears stated age Ears: normal TM's and external ear canals both ears Throat: lips, mucosa, and tongue normal; teeth and gums normal Neck: no adenopathy, no carotid bruit, supple, symmetrical,  trachea midline and thyroid not enlarged, symmetric, no tenderness/mass/nodules Back: symmetric, no curvature. ROM normal. No CVA tenderness. Lungs: clear to auscultation bilaterally Heart: regular rate and rhythm, S1, S2 normal, no murmur, click, rub or gallop Abdomen: soft, non-tender; bowel sounds normal; no masses,  no organomegaly Pulses: 2+ and symmetric Skin: Skin color, texture, turgor normal. No rashes or lesions Lymph nodes: Cervical, supraclavicular, and axillary nodes normal.   Assessment & Plan:   Problem List Items Addressed This Visit    Hypothyroidism    TSH is therapeutic,  Continue current levothyoxine dose,  Recheck in 6 montths   Lab Results  Component Value Date   TSH 1.06 01/17/2015         Relevant Orders   TSH (Completed)   Hyperlipidemia    LDL and triglycerides are at goal on current medications. She has no side effects and liver enzymes are normal. No changes today  Lab Results  Component Value Date   CHOL 193 01/17/2015   HDL 81.40 01/17/2015   LDLCALC 93 01/17/2015   TRIG 89.0 01/17/2015   CHOLHDL 2 01/17/2015   Lab Results  Component Value Date   ALT 14 01/17/2015   AST 21 01/17/2015   ALKPHOS 74 01/17/2015   BILITOT 0.7  01/17/2015           Relevant Orders   Lipid panel (Completed)   Visit for preventive health examination    Annual comprehensive preventive exam was done as well as an evaluation and management of chronic conditions .  During the course of the visit the patient was educated and counseled about appropriate screening and preventive services including :  diabetes screening, lipid analysis with projected  10 year  risk for CAD , nutrition counseling, colorectal cancer screening, and recommended immunizations.  Printed recommendations for health maintenance screenings was given.       Facial tic    Other Visit Diagnoses    Breast cancer screening    -  Primary    Relevant Orders    MM DIGITAL SCREENING BILATERAL     Need for hepatitis C screening test        Relevant Orders    Hepatitis C antibody (Completed)    Vitamin D deficiency        Relevant Orders    VITAMIN D 25 Hydroxy (Vit-D Deficiency, Fractures) (Completed)    Other fatigue        Relevant Orders    Comprehensive metabolic panel (Completed)    CBC with Differential/Platelet (Completed)    Need for prophylactic vaccination against Streptococcus pneumoniae (pneumococcus)           I am having Ms. Neer maintain her Multiple Vitamins-Minerals (PRESERVISION AREDS 2 PO), Polyethyl Glycol-Propyl Glycol (SYSTANE OP), Multiple Vitamins-Minerals (CENTRUM SILVER PO), Calcium Citrate (CITRACAL PO), Misc Natural Products (OSTEO BI-FLEX JOINT SHIELD PO), loratadine, fluticasone, MISC NATURAL PRODUCTS PO, latanoprost, levothyroxine, and pravastatin.  No orders of the defined types were placed in this encounter.    There are no discontinued medications.  Follow-up: No Follow-up on file.   Crecencio Mc, MD

## 2015-01-18 LAB — HEPATITIS C ANTIBODY: HCV Ab: NEGATIVE

## 2015-01-19 ENCOUNTER — Encounter: Payer: Self-pay | Admitting: Internal Medicine

## 2015-01-19 DIAGNOSIS — Z0000 Encounter for general adult medical examination without abnormal findings: Secondary | ICD-10-CM | POA: Insufficient documentation

## 2015-01-19 DIAGNOSIS — F959 Tic disorder, unspecified: Secondary | ICD-10-CM | POA: Insufficient documentation

## 2015-01-19 NOTE — Assessment & Plan Note (Signed)
Annual comprehensive preventive exam was done as well as an evaluation and management of chronic conditions .  During the course of the visit the patient was educated and counseled about appropriate screening and preventive services including :  diabetes screening, lipid analysis with projected  10 year  risk for CAD , nutrition counseling, colorectal cancer screening, and recommended immunizations.  Printed recommendations for health maintenance screenings was given.   

## 2015-01-19 NOTE — Assessment & Plan Note (Signed)
LDL and triglycerides are at goal on current medications. She has no side effects and liver enzymes are normal. No changes today  Lab Results  Component Value Date   CHOL 193 01/17/2015   HDL 81.40 01/17/2015   LDLCALC 93 01/17/2015   TRIG 89.0 01/17/2015   CHOLHDL 2 01/17/2015   Lab Results  Component Value Date   ALT 14 01/17/2015   AST 21 01/17/2015   ALKPHOS 74 01/17/2015   BILITOT 0.7 01/17/2015

## 2015-01-19 NOTE — Assessment & Plan Note (Signed)
TSH is therapeutic,  Continue current levothyoxine dose,  Recheck in 6 montths   Lab Results  Component Value Date   TSH 1.06 01/17/2015

## 2015-02-14 ENCOUNTER — Other Ambulatory Visit: Payer: Self-pay | Admitting: Internal Medicine

## 2015-02-17 DIAGNOSIS — H401121 Primary open-angle glaucoma, left eye, mild stage: Secondary | ICD-10-CM | POA: Diagnosis not present

## 2015-02-25 ENCOUNTER — Other Ambulatory Visit: Payer: Self-pay

## 2015-02-25 ENCOUNTER — Telehealth: Payer: Self-pay | Admitting: Internal Medicine

## 2015-02-25 MED ORDER — LEVOTHYROXINE SODIUM 75 MCG PO TABS: 75.0000 ug | ORAL_TABLET | Freq: Every day | ORAL | Status: DC

## 2015-02-25 NOTE — Telephone Encounter (Signed)
Synthroid 

## 2015-02-25 NOTE — Telephone Encounter (Signed)
Notified the pt that her referral was placed back in January. Do you set this up for her? Please advise, thanks

## 2015-02-25 NOTE — Telephone Encounter (Signed)
Pt called about needing a referral for a mammo. Call pt @ [phone]. Thank you!

## 2015-02-25 NOTE — Telephone Encounter (Signed)
Pt called needing a refill for levothyroxine (SYNTHROID, LEVOTHROID) 75 MCG tablet. Pharmacy is [id number] - [place], [place]. Call pt @ [phone]. Thank you!

## 2015-02-28 ENCOUNTER — Other Ambulatory Visit: Payer: Self-pay | Admitting: Internal Medicine

## 2015-02-28 ENCOUNTER — Ambulatory Visit
Admission: RE | Admit: 2015-02-28 | Discharge: 2015-02-28 | Disposition: A | Discharge status: Home / Self Care | Payer: PPO | Source: Ambulatory Visit | Attending: Internal Medicine | Admitting: Internal Medicine

## 2015-02-28 DIAGNOSIS — Z1231 Encounter for screening mammogram for malignant neoplasm of breast: Secondary | ICD-10-CM | POA: Diagnosis not present

## 2015-02-28 DIAGNOSIS — Z1239 Encounter for other screening for malignant neoplasm of breast: Secondary | ICD-10-CM

## 2015-03-12 ENCOUNTER — Encounter: Payer: Self-pay | Admitting: Internal Medicine

## 2015-03-26 NOTE — Telephone Encounter (Signed)
Mailed unread message to patient.  

## 2015-06-13 DIAGNOSIS — D485 Neoplasm of uncertain behavior of skin: Secondary | ICD-10-CM | POA: Diagnosis not present

## 2015-06-13 DIAGNOSIS — D2262 Melanocytic nevi of left upper limb, including shoulder: Secondary | ICD-10-CM | POA: Diagnosis not present

## 2015-06-13 DIAGNOSIS — D2272 Melanocytic nevi of left lower limb, including hip: Secondary | ICD-10-CM | POA: Diagnosis not present

## 2015-06-13 DIAGNOSIS — D225 Melanocytic nevi of trunk: Secondary | ICD-10-CM | POA: Diagnosis not present

## 2015-06-13 DIAGNOSIS — L821 Other seborrheic keratosis: Secondary | ICD-10-CM | POA: Diagnosis not present

## 2015-08-18 DIAGNOSIS — H401121 Primary open-angle glaucoma, left eye, mild stage: Secondary | ICD-10-CM | POA: Diagnosis not present

## 2015-08-22 DIAGNOSIS — M170 Bilateral primary osteoarthritis of knee: Secondary | ICD-10-CM | POA: Diagnosis not present

## 2015-08-22 DIAGNOSIS — S86811A Strain of other muscle(s) and tendon(s) at lower leg level, right leg, initial encounter: Secondary | ICD-10-CM | POA: Diagnosis not present

## 2015-08-25 DIAGNOSIS — H353132 Nonexudative age-related macular degeneration, bilateral, intermediate dry stage: Secondary | ICD-10-CM | POA: Diagnosis not present

## 2015-09-08 ENCOUNTER — Telehealth: Payer: Self-pay | Admitting: Internal Medicine

## 2015-09-08 NOTE — Telephone Encounter (Signed)
Ms. Balster returned Rasheedah's call and would like a return call.  Pt's ph# [phone] Thank you.

## 2015-09-08 NOTE — Telephone Encounter (Signed)
Ok. I spoke with pt. Thank you!

## 2015-10-16 ENCOUNTER — Encounter: Payer: Self-pay | Admitting: Internal Medicine

## 2015-10-16 ENCOUNTER — Ambulatory Visit (INDEPENDENT_AMBULATORY_CARE_PROVIDER_SITE_OTHER): Payer: PPO | Admitting: Internal Medicine

## 2015-10-16 VITALS — BP 130/70 | HR 64 | Temp 98.0°F | Resp 10 | Ht 59.0 in | Wt 106.5 lb

## 2015-10-16 DIAGNOSIS — Z23 Encounter for immunization: Secondary | ICD-10-CM

## 2015-10-16 DIAGNOSIS — K591 Functional diarrhea: Secondary | ICD-10-CM | POA: Diagnosis not present

## 2015-10-16 DIAGNOSIS — E782 Mixed hyperlipidemia: Secondary | ICD-10-CM | POA: Diagnosis not present

## 2015-10-16 DIAGNOSIS — E034 Atrophy of thyroid (acquired): Secondary | ICD-10-CM

## 2015-10-16 DIAGNOSIS — Z79899 Other long term (current) drug therapy: Secondary | ICD-10-CM

## 2015-10-16 DIAGNOSIS — R609 Edema, unspecified: Secondary | ICD-10-CM | POA: Diagnosis not present

## 2015-10-16 DIAGNOSIS — K117 Disturbances of salivary secretion: Secondary | ICD-10-CM

## 2015-10-16 LAB — LIPID PANEL
CHOLESTEROL: 136 mg/dL (ref 0–200)
HDL: 66.2 mg/dL (ref >39.00)
LDL CALC: 55 mg/dL (ref 0–99)
NonHDL: 69.46
Total CHOL/HDL Ratio: 2
Triglycerides: 71 mg/dL (ref 0.0–149.0)
VLDL: 14.2 mg/dL (ref 0.0–40.0)

## 2015-10-16 LAB — MICROALBUMIN / CREATININE URINE RATIO
Creatinine,U: 114.9 mg/dL
MICROALB UR: 1.7 mg/dL (ref 0.0–1.9)
Microalb Creat Ratio: 1.5 mg/g (ref 0.0–30.0)

## 2015-10-16 LAB — COMPREHENSIVE METABOLIC PANEL
ALBUMIN: 3.6 g/dL (ref 3.5–5.2)
ALT: 12 U/L (ref 0–35)
AST: 15 U/L (ref 0–37)
Alkaline Phosphatase: 81 U/L (ref 39–117)
BUN: 14 mg/dL (ref 6–23)
CHLORIDE: 101 meq/L (ref 96–112)
CO2: 32 mEq/L (ref 19–32)
Calcium: 9 mg/dL (ref 8.4–10.5)
Creatinine, Ser: 0.72 mg/dL (ref 0.40–1.20)
GFR: 81.53 mL/min (ref >60.00)
GLUCOSE: 91 mg/dL (ref 70–99)
POTASSIUM: 3.9 meq/L (ref 3.5–5.1)
SODIUM: 139 meq/L (ref 135–145)
Total Bilirubin: 0.6 mg/dL (ref 0.2–1.2)
Total Protein: 7 g/dL (ref 6.0–8.3)

## 2015-10-16 LAB — MAGNESIUM: MAGNESIUM: 1.8 mg/dL (ref 1.5–2.5)

## 2015-10-16 LAB — TSH: TSH: 0.47 u[IU]/mL (ref 0.35–4.50)

## 2015-10-16 NOTE — Progress Notes (Signed)
Subjective:  Patient ID: Gloria Carr, female    DOB: July 14, 1927  Age: 82 y.o. MRN: [id number]  CC: The primary encounter diagnosis was Edema, unspecified type. Diagnoses of Mixed hyperlipidemia, Long-term use of high-risk medication, Hypothyroidism due to acquired atrophy of thyroid, Functional diarrhea, Encounter for immunization, and Hypersalivation were also pertinent to this visit.  HPI [name] presents for follow up on multiple issues including hypothyroid and hyperlipidemia  1) Has been having bilateral ankle edema  SINCE STRAINING HER RIGHT LEG  IN EARLY AUGUST.  Saw Ortho at [facility] and has been taking Mobic daily since then.  Worried about developing Type 2 DM  Since she has neuropathy, mild. interferes with her love for line dancing .  Dances one or two days per week at [facility]. Very active with play reading.  No orthopenea or weight gaib,, .  Has a 9 am exercise class every morning . Eats breakfast early in the am before the workout class: cereal with mild or cream of wheat   2) EXCESSIVE SALIVA , finds herself nearly drooling,  Involuntarily blowing of bubbles   USING BIOTENE spray and biotene thoothpaste  3) nose runs a lot every morning,  Clear .  Stops by lunch time. .  Not a lot of sneezing  4) chronic problems with frequent BMs in the morning.  Start off solid, and get progressively softe but not liquid .  Has fecal urgency every morning around 6:16 am    No blood in stools.  This has been occurring for years   Fasting today   Outpatient Medications Prior to Visit  Medication Sig Dispense Refill  . Calcium Citrate (CITRACAL PO) Take by mouth. Reported on 01/17/2015    . latanoprost (XALATAN) 0.005 % ophthalmic solution INSTILL 1 DROP IN THE LEFT EYE AT NIGHT  5  . levothyroxine (SYNTHROID, LEVOTHROID) 75 MCG tablet TAKE 1 TABLET(75 MCG) BY MOUTH DAILY BEFORE BREAKFAST 90 tablet 0  . Misc Natural Products (OSTEO BI-FLEX JOINT SHIELD PO) Take by mouth.      Marland Kitchen MISC NATURAL PRODUCTS PO Take by mouth. Peripheral nerve health    . Multiple Vitamins-Minerals (CENTRUM SILVER PO) Take by mouth.    . Multiple Vitamins-Minerals (PRESERVISION AREDS 2 PO) Take by mouth.    Vladimir Faster Glycol-Propyl Glycol (SYSTANE OP) Apply to eye.    . pravastatin (PRAVACHOL) 20 MG tablet TAKE 1 TABLET(20 MG) BY MOUTH DAILY 90 tablet 3  . fluticasone (FLONASE) 50 MCG/ACT nasal spray Place into both nostrils daily. Reported on 01/17/2015    . loratadine (CLARITIN) 10 MG tablet Take 10 mg by mouth daily. Reported on 01/17/2015    . levothyroxine (SYNTHROID, LEVOTHROID) 75 MCG tablet Take 1 tablet (75 mcg total) by mouth daily before breakfast. 90 tablet 3   No facility-administered medications prior to visit.     Review of Systems;  Patient denies headache, fevers, malaise, unintentional weight loss, skin rash, eye pain, sinus congestion and sinus pain, sore throat, dysphagia,  hemoptysis , cough, dyspnea, wheezing, chest pain, palpitations, orthopnea, edema, abdominal pain, nausea, melena, diarrhea, constipation, flank pain, dysuria, hematuria, urinary  Frequency, nocturia, numbness, tingling, seizures,  Focal weakness, Loss of consciousness,  Tremor, insomnia, depression, anxiety, and suicidal ideation.      Objective:  BP 130/70   Pulse 64   Temp 98 F (36.7 C) (Oral)   Resp 10   Ht 4\' 11"  (1.499 m)   Wt 106 lb 8 oz (48.3 kg)  LMP  (LMP Unknown)   SpO2 96%   BMI 21.51 kg/m   BP Readings from Last 3 Encounters:  10/16/15 130/70  01/17/15 (!) 144/78  12/10/14 128/68    Wt Readings from Last 3 Encounters:  10/16/15 106 lb 8 oz (48.3 kg)  01/17/15 108 lb 4 oz (49.1 kg)  12/10/14 110 lb 3.2 oz (50 kg)    General appearance: alert, cooperative and appears stated age Ears: normal TM's and external ear canals both ears Throat: lips, mucosa, and tongue normal; teeth and gums normal Neck: no adenopathy, no carotid bruit, supple, symmetrical, trachea midline  and thyroid not enlarged, symmetric, no tenderness/mass/nodules Back: symmetric, no curvature. ROM normal. No CVA tenderness. Lungs: clear to auscultation bilaterally Heart: regular rate and rhythm, S1, S2 normal, no murmur, click, rub or gallop Abdomen: soft, non-tender; bowel sounds normal; no masses,  no organomegaly Pulses: 2+ and symmetric Skin: Skin color, texture, turgor normal. No rashes or lesions Lymph nodes: Cervical, supraclavicular, and axillary nodes normal.  Lab Results  Component Value Date   HGBA1C 5.7 07/16/2014    Lab Results  Component Value Date   CREATININE 0.72 10/16/2015   CREATININE 0.73 01/17/2015   CREATININE 0.80 07/16/2014    Lab Results  Component Value Date   WBC 8.8 01/17/2015   HGB 15.1 (H) 01/17/2015   HCT 45.9 01/17/2015   PLT 275.0 01/17/2015   GLUCOSE 91 10/16/2015   CHOL 136 10/16/2015   TRIG 71.0 10/16/2015   HDL 66.20 10/16/2015   LDLCALC 55 10/16/2015   ALT 12 10/16/2015   AST 15 10/16/2015   NA 139 10/16/2015   K 3.9 10/16/2015   CL 101 10/16/2015   CREATININE 0.72 10/16/2015   BUN 14 10/16/2015   CO2 32 10/16/2015   TSH 0.47 10/16/2015   HGBA1C 5.7 07/16/2014   MICROALBUR 1.7 10/16/2015    Mm Digital Screening Bilateral  Result Date: 03/11/2015 CLINICAL DATA:  Screening. EXAM: DIGITAL SCREENING BILATERAL MAMMOGRAM WITH CAD COMPARISON:  Previous exam(s). ACR Breast Density Category b: There are scattered areas of fibroglandular density. FINDINGS: There are no findings suspicious for malignancy. Images were processed with CAD. IMPRESSION: No mammographic evidence of malignancy. A result letter of this screening mammogram will be mailed directly to the patient. RECOMMENDATION: Screening mammogram in one year. (Code:SM-B-01Y) BI-RADS CATEGORY  1: Negative. Electronically Signed   By: Altamese Cabal M.D.   On: 03/11/2015 16:40    Assessment & Plan:   Problem List Items Addressed This Visit    Hypothyroidism    Thyroid  function is very active on current dose.  No current changes needed.   Lab Results  Component Value Date   TSH 0.47 10/16/2015         Relevant Orders   TSH (Completed)   Edema - Primary    Advised to suspend meloxicam as  A trial . Labs normal  Lab Results  Component Value Date   TSH 0.47 10/16/2015   Lab Results  Component Value Date   CREATININE 0.72 10/16/2015   Lab Results  Component Value Date   NA 139 10/16/2015   K 3.9 10/16/2015   CL 101 10/16/2015   CO2 32 10/16/2015         Relevant Orders   Microalbumin / creatinine urine ratio (Completed)   Hypersalivation    Reported by patient. Not noticed during 30 minute visit.  Advised to suspend the Biotene as she may be using excessive amounts.  Hyperlipidemia   Relevant Orders   Lipid panel (Completed)    Other Visit Diagnoses    Long-term use of high-risk medication       Relevant Orders   Comprehensive metabolic panel (Completed)   Functional diarrhea       Relevant Orders   Magnesium (Completed)   Encounter for immunization       Relevant Orders   Flu vaccine HIGH DOSE PF (Completed)      A total of 25 minutes of face to face time was spent with patient more than half of which was spent in counselling about the above mentioned conditions  and coordination of care     I am having Ms. Michetti maintain her Multiple Vitamins-Minerals (PRESERVISION AREDS 2 PO), Polyethyl Glycol-Propyl Glycol (SYSTANE OP), Multiple Vitamins-Minerals (CENTRUM SILVER PO), Calcium Citrate (CITRACAL PO), Misc Natural Products (OSTEO BI-FLEX JOINT SHIELD PO), loratadine, fluticasone, MISC NATURAL PRODUCTS PO, latanoprost, pravastatin, levothyroxine, and meloxicam.  Meds ordered this encounter  Medications  . meloxicam (MOBIC) 15 MG tablet    Sig: TK 1 T PO QD WC    Refill:  2    Medications Discontinued During This Encounter  Medication Reason  . levothyroxine (SYNTHROID, LEVOTHROID) 75 MCG tablet Duplicate     Follow-up: Return in about 6 months (around 04/15/2016) for Nov 29th wellness.   Crecencio Mc, MD

## 2015-10-16 NOTE — Patient Instructions (Addendum)
Stop the meloxicam .  It may be causing the swelling in your legs.  You can use Advil or Aleve if needed for pain  Stop using the Biotene toothpaste  for a while; it may be causing too much saliva and may be affecting your BM's .

## 2015-10-16 NOTE — Progress Notes (Signed)
Pre-visit discussion using our clinic review tool. No additional management support is needed unless otherwise documented below in the visit note.  

## 2015-10-18 ENCOUNTER — Encounter: Payer: Self-pay | Admitting: Internal Medicine

## 2015-10-18 DIAGNOSIS — R609 Edema, unspecified: Secondary | ICD-10-CM | POA: Insufficient documentation

## 2015-10-18 DIAGNOSIS — K117 Disturbances of salivary secretion: Secondary | ICD-10-CM | POA: Insufficient documentation

## 2015-10-18 NOTE — Assessment & Plan Note (Signed)
Advised to suspend meloxicam as  A trial . Labs normal  Lab Results  Component Value Date   TSH 0.47 10/16/2015   Lab Results  Component Value Date   CREATININE 0.72 10/16/2015   Lab Results  Component Value Date   NA 139 10/16/2015   K 3.9 10/16/2015   CL 101 10/16/2015   CO2 32 10/16/2015

## 2015-10-18 NOTE — Assessment & Plan Note (Signed)
Thyroid function is very active on current dose.  No current changes needed.   Lab Results  Component Value Date   TSH 0.47 10/16/2015

## 2015-10-18 NOTE — Assessment & Plan Note (Signed)
Reported by patient. Not noticed during 30 minute visit.  Advised to suspend the Biotene as she may be using excessive amounts.

## 2015-10-20 ENCOUNTER — Telehealth: Payer: Self-pay | Admitting: Internal Medicine

## 2015-10-20 DIAGNOSIS — Z780 Asymptomatic menopausal state: Secondary | ICD-10-CM

## 2015-10-20 MED ORDER — TETANUS-DIPHTH-ACELL PERTUSSIS 5-2.5-18.5 LF-MCG/0.5 IM SUSP
0.5000 mL | Freq: Once | INTRAMUSCULAR | 0 refills | Status: DC
Start: 2015-10-20 — End: 2015-11-11

## 2015-10-20 NOTE — Telephone Encounter (Signed)
Patient needs script for TDAP and order for DEXA scan last seen 10/16/15

## 2015-10-20 NOTE — Telephone Encounter (Signed)
DEXA scan ordered.  Tdap printed

## 2015-10-20 NOTE — Telephone Encounter (Signed)
Pt received a Mychart message stating she needs to get  Tetanus/Tdap  Dexa Scan   Is pt due? Pt is also going to check with her insurance if the tetanus/tdap is covered. Please advise? Thank you!  Call pt @ [phone].

## 2015-10-21 NOTE — Telephone Encounter (Signed)
Pt wanted to know if she can get the Tdap done here at the office?

## 2015-10-21 NOTE — Telephone Encounter (Signed)
Cost is much cheaper at pharmacy medicare will not pay in office.

## 2015-10-21 NOTE — Telephone Encounter (Signed)
Disregard previous called patient and I am mailing script gfor TDAP>

## 2015-10-21 NOTE — Telephone Encounter (Signed)
Ok. I called pt and left vm.

## 2015-11-11 ENCOUNTER — Telehealth: Payer: Self-pay | Admitting: *Deleted

## 2015-11-11 MED ORDER — TETANUS-DIPHTH-ACELL PERTUSSIS 5-2.5-18.5 LF-MCG/0.5 IM SUSP: 0.5000 mL | Freq: Once | INTRAMUSCULAR | 0 refills | Status: AC

## 2015-11-11 NOTE — Telephone Encounter (Signed)
Placed script in MD quick sign to resend to Patient.

## 2015-11-11 NOTE — Telephone Encounter (Signed)
Pt stated that she did not receive the script through mail to receive her TDAP, so that she could have this done at the pharmacy  Pt contact [phone]

## 2015-11-11 NOTE — Telephone Encounter (Signed)
tdap script was mailed to pt.

## 2015-11-12 NOTE — Telephone Encounter (Signed)
Patient bone density scheduled for 12/22/15@ 9.40 Am ,  And patient aware.

## 2015-11-12 NOTE — Telephone Encounter (Signed)
Mailed to patient

## 2015-11-14 DIAGNOSIS — L820 Inflamed seborrheic keratosis: Secondary | ICD-10-CM | POA: Diagnosis not present

## 2015-11-14 DIAGNOSIS — L538 Other specified erythematous conditions: Secondary | ICD-10-CM | POA: Diagnosis not present

## 2015-11-14 DIAGNOSIS — L821 Other seborrheic keratosis: Secondary | ICD-10-CM | POA: Diagnosis not present

## 2015-11-14 DIAGNOSIS — D2272 Melanocytic nevi of left lower limb, including hip: Secondary | ICD-10-CM | POA: Diagnosis not present

## 2015-11-14 DIAGNOSIS — D2261 Melanocytic nevi of right upper limb, including shoulder: Secondary | ICD-10-CM | POA: Diagnosis not present

## 2015-11-14 DIAGNOSIS — D225 Melanocytic nevi of trunk: Secondary | ICD-10-CM | POA: Diagnosis not present

## 2015-12-10 ENCOUNTER — Ambulatory Visit (INDEPENDENT_AMBULATORY_CARE_PROVIDER_SITE_OTHER): Payer: PPO

## 2015-12-10 VITALS — BP 138/62 | HR 61 | Temp 97.8°F | Resp 12 | Ht 59.0 in | Wt 107.8 lb

## 2015-12-10 DIAGNOSIS — Z0000 Encounter for general adult medical examination without abnormal findings: Secondary | ICD-10-CM

## 2015-12-10 NOTE — Patient Instructions (Addendum)
  Gloria Carr , Thank you for taking time to come for your Medicare Wellness Visit. I appreciate your ongoing commitment to your health goals. Please review the following plan we discussed and let me know if I can assist you in the future.   These are the goals we discussed: Goals    . Increase water intake          Stay hydrated and drink plenty of fluids/water.   Finish entire cup of water after taking medications.  Start drinking 2 cups =1 bottle water daily.       This is a list of the screening recommended for you and due dates:  Health Maintenance  Topic Date Due  . Tetanus Vaccine  04/12/1948  . DEXA scan (bone density measurement)  04/13/1994  . Flu Shot  Completed  . Shingles Vaccine  Completed  . Pneumonia vaccines  Completed   Bone Densitometry Introduction Bone densitometry is an imaging test that uses a special X-ray to measure the amount of calcium and other minerals in your bones (bone density). This test is also known as a bone mineral density test or dual-energy X-ray absorptiometry (DXA). The test can measure bone density at your hip and your spine. It is similar to having a regular X-ray. You may have this test to:  Diagnose a condition that causes weak or thin bones (osteoporosis).  Predict your risk of a broken bone (fracture).  Determine how well osteoporosis treatment is working. Tell a health care provider about:  Any allergies you have.  All medicines you are taking, including vitamins, herbs, eye drops, creams, and over-the-counter medicines.  Any problems you or family members have had with anesthetic medicines.  Any blood disorders you have.  Any surgeries you have had.  Any medical conditions you have.  Possibility of pregnancy.  Any other medical test you had within the previous 14 days that used contrast material. What are the risks? Generally, this is a safe procedure. However, problems can occur and may include the following:  This  test exposes you to a very small amount of radiation.  The risks of radiation exposure may be greater to unborn children. What happens before the procedure?  Do not take any calcium supplements for 24 hours before having the test. You can otherwise eat and drink what you usually do.  Take off all metal jewelry, eyeglasses, dental appliances, and any other metal objects. What happens during the procedure?  You may lie on an exam table. There will be an X-ray generator below you and an imaging device above you.  Other devices, such as boxes or braces, may be used to position your body properly for the scan.  You will need to lie still while the machine slowly scans your body.  The images will show up on a computer monitor. What happens after the procedure? You may need more testing at a later time. This information is not intended to replace advice given to you by your health care provider. Make sure you discuss any questions you have with your health care provider. Document Released: 01/20/2004 Document Revised: 06/05/2015 Document Reviewed: 06/07/2013  2017 Elsevier

## 2015-12-10 NOTE — Progress Notes (Signed)
Subjective:   Gloria Carr is a 82 y.o. female who presents for Medicare Annual (Subsequent) preventive examination.  Review of Systems:  No ROS.  Medicare Wellness Visit.  Cardiac Risk Factors include: advanced age (>77men, >87 women)     Objective:     Vitals: BP 138/62 (BP Location: Left Arm, Patient Position: Sitting, Cuff Size: Normal)   Pulse 61   Temp 97.8 F (36.6 C) (Oral)   Resp 12   Ht 4\' 11"  (1.499 m)   Wt 107 lb 12.8 oz (48.9 kg)   LMP  (LMP Unknown)   SpO2 95%   BMI 21.77 kg/m   Body mass index is 21.77 kg/m.   Tobacco History  Smoking Status  . Former Smoker  Smokeless Tobacco  . Never Used     Counseling given: Not Answered   Past Medical History:  Diagnosis Date  . Allergy   . Arthritis   . Glaucoma   . History of chickenpox   . History of recurrent UTIs   . Thyroid disease    Past Surgical History:  Procedure Laterality Date  . BILATERAL CARPAL TUNNEL RELEASE  1999-2000  . CATARACT EXTRACTION, BILATERAL  2013   Family History  Problem Relation Age of Onset  . Stroke Mother   . Cancer Mother     colon  . Cancer Father     bone  . Cancer Son     Jaw   History  Sexual Activity  . Sexual activity: No    Outpatient Encounter Prescriptions as of 12/10/2015  Medication Sig  . Calcium Citrate (CITRACAL PO) Take by mouth. Reported on 01/17/2015  . latanoprost (XALATAN) 0.005 % ophthalmic solution INSTILL 1 DROP IN THE LEFT EYE AT NIGHT  . levothyroxine (SYNTHROID, LEVOTHROID) 75 MCG tablet TAKE 1 TABLET(75 MCG) BY MOUTH DAILY BEFORE BREAKFAST  . loratadine (CLARITIN) 10 MG tablet Take 10 mg by mouth daily. Reported on 01/17/2015  . Misc Natural Products (OSTEO BI-FLEX JOINT SHIELD PO) Take by mouth.  Marland Kitchen MISC NATURAL PRODUCTS PO Take by mouth. Peripheral nerve health  . Multiple Vitamins-Minerals (CENTRUM SILVER PO) Take by mouth.  . Multiple Vitamins-Minerals (PRESERVISION AREDS 2 PO) Take by mouth.  Vladimir Faster Glycol-Propyl Glycol  (SYSTANE OP) Apply to eye.  . pravastatin (PRAVACHOL) 20 MG tablet TAKE 1 TABLET(20 MG) BY MOUTH DAILY  . fluticasone (FLONASE) 50 MCG/ACT nasal spray Place into both nostrils daily. Reported on 01/17/2015  . meloxicam (MOBIC) 15 MG tablet TK 1 T PO QD WC   No facility-administered encounter medications on file as of 12/10/2015.     Activities of Daily Living In your present state of health, do you have any difficulty performing the following activities: 12/10/2015 12/10/2014  Hearing? N N  Vision? N N  Difficulty concentrating or making decisions? Y N  Walking or climbing stairs? N N  Dressing or bathing? N N  Doing errands, shopping? N N  Preparing Food and eating ? N N  Using the Toilet? N N  In the past six months, have you accidently leaked urine? Y N  Do you have problems with loss of bowel control? N N  Managing your Medications? N N  Managing your Finances? N N  Housekeeping or managing your Housekeeping? N N  Some recent data might be hidden    Patient Care Team: Crecencio Mc, MD as PCP - General (Internal Medicine)    Assessment:    This is a routine wellness examination for [name].  The goal of the wellness visit is to assist the patient how to close the gaps in care and create a preventative care plan for the patient.   Osteoporosis reviewed.  Bone Density ordered, follow as directed.  Medications reviewed; taking without issues or barriers.  Safety issues reviewed; life alert necklace and smoke detectors in the home. No firearms in the home. Wears seatbelts when driving or riding with others. No violence in the home.  No identified risk were noted; The patient was oriented x 3; appropriate in dress and manner and no objective failures at ADL's or IADL's.   BMI; normal.  Discussed the importance of a healthy diet, water intake and exercise. Educational material provided.  Patient Concerns: None at this time. Follow up with PCP as needed.  Exercise  Activities and Dietary recommendations Current Exercise Habits: Structured exercise class (TaiChi, chair exercises), Time (Minutes): 45, Frequency (Times/Week): 5, Weekly Exercise (Minutes/Week): 225, Intensity: Moderate  Goals    . Increase water intake          Stay hydrated and drink plenty of fluids/water.   Finish entire cup of water after taking medications.  Start drinking 2 cups =1 bottle water daily.      Fall Risk Fall Risk  12/10/2015 12/10/2014  Falls in the past year? No Yes  Injury with Fall? - No   Depression Screen PHQ 2/9 Scores 12/10/2015 12/10/2014  PHQ - 2 Score 0 0     Cognitive Function MMSE - Mini Mental State Exam 12/10/2014  Orientation to time 5  Orientation to Place 5  Registration 3  Attention/ Calculation 5  Recall 3  Language- name 2 objects 2  Language- repeat 1  Language- follow 3 step command 3  Language- read & follow direction 1  Write a sentence 1  Copy design 1  Total score 30     6CIT Screen 12/10/2015  What Year? 0 points  What month? 0 points  What time? 0 points  Count back from 20 0 points  Months in reverse 0 points  Repeat phrase 2 points  Total Score 2    Immunization History  Administered Date(s) Administered  . Influenza, High Dose Seasonal PF 10/16/2015  . Influenza-Unspecified 09/12/2014  . Pneumococcal Conjugate-13 01/17/2015  . Pneumococcal Polysaccharide-23 01/17/2011  . Zoster 07/15/2012   Screening Tests Health Maintenance  Topic Date Due  . TETANUS/TDAP  04/12/1948  . DEXA SCAN  04/13/1994  . INFLUENZA VACCINE  Completed  . ZOSTAVAX  Completed  . PNA vac Low Risk Adult  Completed      Plan:    End of life planning; Advance aging; Advanced directives discussed. Copy of current HCPOA/Living Will requested.  TDAP vaccine Rx received; follow up with PCP when completed.  Medicare Attestation I have personally reviewed: The patient's medical and social history Their use of alcohol, tobacco or  illicit drugs Their current medications and supplements The patient's functional ability including ADLs,fall risks, home safety risks, cognitive, and hearing and visual impairment Diet and physical activities Evidence for depression   The patient's weight, height, BMI, and visual acuity have been recorded in the chart.  I have made referrals and provided education to the patient based on review of the above and I have provided the patient with a written personalized care plan for preventive services.    During the course of the visit the patient was educated and counseled about the following appropriate screening and preventive services:   Vaccines to include Pneumoccal,  Influenza, Hepatitis B, Td, Zostavax, HCV  Electrocardiogram  Cardiovascular Disease  Colorectal cancer screening  Bone density screening  Diabetes screening  Glaucoma screening  Mammography/PAP  Nutrition counseling   Patient Instructions (the written plan) was given to the patient.   Varney Biles, LPN  [id number]

## 2015-12-16 NOTE — Progress Notes (Signed)
  I have reviewed the above information and agree with above.   Kimberley Dastrup, MD 

## 2015-12-16 NOTE — Progress Notes (Signed)
  I have reviewed the above information and agree with above.   Teresa Tullo, MD 

## 2015-12-22 ENCOUNTER — Ambulatory Visit
Admission: RE | Admit: 2015-12-22 | Discharge: 2015-12-22 | Disposition: A | Discharge status: Home / Self Care | Payer: PPO | Source: Ambulatory Visit | Attending: Internal Medicine | Admitting: Internal Medicine

## 2015-12-22 DIAGNOSIS — M4186 Other forms of scoliosis, lumbar region: Secondary | ICD-10-CM | POA: Diagnosis not present

## 2015-12-22 DIAGNOSIS — M810 Age-related osteoporosis without current pathological fracture: Secondary | ICD-10-CM | POA: Diagnosis not present

## 2015-12-22 DIAGNOSIS — Z780 Asymptomatic menopausal state: Secondary | ICD-10-CM | POA: Diagnosis not present

## 2015-12-22 DIAGNOSIS — Z87891 Personal history of nicotine dependence: Secondary | ICD-10-CM | POA: Insufficient documentation

## 2015-12-25 ENCOUNTER — Encounter: Payer: Self-pay | Admitting: Internal Medicine

## 2015-12-25 DIAGNOSIS — M810 Age-related osteoporosis without current pathological fracture: Secondary | ICD-10-CM | POA: Insufficient documentation

## 2015-12-25 NOTE — Assessment & Plan Note (Signed)
OV needed to discuss therapy

## 2016-02-25 ENCOUNTER — Other Ambulatory Visit: Payer: Self-pay | Admitting: Internal Medicine

## 2016-02-27 DIAGNOSIS — H401131 Primary open-angle glaucoma, bilateral, mild stage: Secondary | ICD-10-CM | POA: Diagnosis not present

## 2016-04-01 ENCOUNTER — Other Ambulatory Visit: Payer: Self-pay | Admitting: Internal Medicine

## 2016-04-01 DIAGNOSIS — Z1231 Encounter for screening mammogram for malignant neoplasm of breast: Secondary | ICD-10-CM

## 2016-04-15 ENCOUNTER — Ambulatory Visit (INDEPENDENT_AMBULATORY_CARE_PROVIDER_SITE_OTHER): Payer: PPO | Admitting: Internal Medicine

## 2016-04-15 VITALS — BP 122/70 | HR 56 | Temp 98.0°F | Resp 15 | Ht 59.0 in | Wt 102.6 lb

## 2016-04-15 DIAGNOSIS — E039 Hypothyroidism, unspecified: Secondary | ICD-10-CM

## 2016-04-15 DIAGNOSIS — E7800 Pure hypercholesterolemia, unspecified: Secondary | ICD-10-CM

## 2016-04-15 DIAGNOSIS — E559 Vitamin D deficiency, unspecified: Secondary | ICD-10-CM

## 2016-04-15 DIAGNOSIS — M810 Age-related osteoporosis without current pathological fracture: Secondary | ICD-10-CM | POA: Diagnosis not present

## 2016-04-15 DIAGNOSIS — R5383 Other fatigue: Secondary | ICD-10-CM

## 2016-04-15 DIAGNOSIS — G609 Hereditary and idiopathic neuropathy, unspecified: Secondary | ICD-10-CM | POA: Diagnosis not present

## 2016-04-15 LAB — COMPREHENSIVE METABOLIC PANEL
ALT: 13 U/L (ref 0–35)
AST: 17 U/L (ref 0–37)
Albumin: 4 g/dL (ref 3.5–5.2)
Alkaline Phosphatase: 74 U/L (ref 39–117)
BUN: 13 mg/dL (ref 6–23)
CALCIUM: 9.4 mg/dL (ref 8.4–10.5)
CHLORIDE: 104 meq/L (ref 96–112)
CO2: 33 meq/L — AB (ref 19–32)
Creatinine, Ser: 0.78 mg/dL (ref 0.40–1.20)
GFR: 74.25 mL/min (ref >60.00)
Glucose, Bld: 92 mg/dL (ref 70–99)
POTASSIUM: 4.3 meq/L (ref 3.5–5.1)
Sodium: 141 mEq/L (ref 135–145)
Total Bilirubin: 0.7 mg/dL (ref 0.2–1.2)
Total Protein: 6.5 g/dL (ref 6.0–8.3)

## 2016-04-15 LAB — CBC WITH DIFFERENTIAL/PLATELET
BASOS ABS: 0 10*3/uL (ref 0.0–0.1)
Basophils Relative: 0.5 % (ref 0.0–3.0)
EOS PCT: 0.9 % (ref 0.0–5.0)
Eosinophils Absolute: 0.1 10*3/uL (ref 0.0–0.7)
HCT: 45 % (ref 36.0–46.0)
HEMOGLOBIN: 14.9 g/dL (ref 12.0–15.0)
LYMPHS PCT: 24.1 % (ref 12.0–46.0)
Lymphs Abs: 2 10*3/uL (ref 0.7–4.0)
MCHC: 33.1 g/dL (ref 30.0–36.0)
MCV: 91.5 fl (ref 78.0–100.0)
MONOS PCT: 8.9 % (ref 3.0–12.0)
Monocytes Absolute: 0.7 10*3/uL (ref 0.1–1.0)
Neutro Abs: 5.5 10*3/uL (ref 1.4–7.7)
Neutrophils Relative %: 65.6 % (ref 43.0–77.0)
Platelets: 260 10*3/uL (ref 150.0–400.0)
RBC: 4.92 Mil/uL (ref 3.87–5.11)
RDW: 13.1 % (ref 11.5–15.5)
WBC: 8.3 10*3/uL (ref 4.0–10.5)

## 2016-04-15 LAB — VITAMIN D 25 HYDROXY (VIT D DEFICIENCY, FRACTURES): VITD: 30.55 ng/mL (ref 30.00–100.00)

## 2016-04-15 LAB — LIPID PANEL
Cholesterol: 161 mg/dL (ref 0–200)
HDL: 79.5 mg/dL (ref >39.00)
LDL CALC: 71 mg/dL (ref 0–99)
NONHDL: 81.92
Total CHOL/HDL Ratio: 2
Triglycerides: 56 mg/dL (ref 0.0–149.0)
VLDL: 11.2 mg/dL (ref 0.0–40.0)

## 2016-04-15 LAB — TSH: TSH: 0.48 u[IU]/mL (ref 0.35–4.50)

## 2016-04-15 MED ORDER — PRAVASTATIN SODIUM 20 MG PO TABS: ORAL_TABLET | ORAL | 1 refills | Status: DC

## 2016-04-15 MED ORDER — DULOXETINE HCL 20 MG PO CPEP: 20.0000 mg | ORAL_CAPSULE | Freq: Every day | ORAL | 5 refills | Status: DC

## 2016-04-15 NOTE — Patient Instructions (Signed)
I recommend that we start yo on medication for osteoporosis  Because of your age,  The only safe medication is the twice a year injection called Prolia.  We will obtain prior authorization from your insurance and let you know what  the out of pocket cost willb e so you can decide if you want to have it    I am prescribing  cymbalta. For your neuropathy.  Take it daily at bedtime.

## 2016-04-15 NOTE — Progress Notes (Signed)
Pre visit review using our clinic review tool, if applicable. No additional management support is needed unless otherwise documented below in the visit note. 

## 2016-04-15 NOTE — Progress Notes (Signed)
Subjective:  Patient ID: Aramis Zobel, female    DOB: April 22, 1927  Age: 83 y.o. MRN: [id number]  CC: The primary encounter diagnosis was Acquired hypothyroidism. Diagnoses of Pure hypercholesterolemia, Vitamin D deficiency, Other fatigue, Age-related osteoporosis without current pathological fracture, and Hereditary and idiopathic peripheral neuropathy were also pertinent to this visit.  HPI Eily Louvier presents for 6 month follow upon hypothyroidism, hyperlipidemia  She has been losing weight and feeling fatigued.  Appetite is  Ok,  "I'm not a big eater" diet reviewed and is fairly indulgent. Eats chocolate and ice cream daily,  3 square meals daily.  5 lb weight loss  noted over the past month  Stays busy all the time volunteering and socializing  lives at [facility] [place] but eats meals  on her own   Still worried about her bowels.  Has 2 or 3  Formed BMs every morning, but they get progressively softer as the day progresses.   Denies blood in stools, ges occasional nausea   Bilateral neuropathy, "feels like she is walking on a  balled up sock underneath her toes . Interferes with line dancing, worried about falling.  Very active physically, does  tai chi and chair aerobics 5 days per week.    No cough, night sweats or fever  Osteoporosis:  diagnosed by DEXA.  Discussed treatment options; given her age.  Prolia recommended,    Had boostrix at [facility] nov 2017 (TDaP)   "I urinate constantly"   One nighttime void   .    Outpatient Medications Prior to Visit  Medication Sig Dispense Refill  . Calcium Citrate (CITRACAL PO) Take by mouth. Reported on 01/17/2015    . fluticasone (FLONASE) 50 MCG/ACT nasal spray Place into both nostrils daily. Reported on 01/17/2015    . latanoprost (XALATAN) 0.005 % ophthalmic solution INSTILL 1 DROP IN THE LEFT EYE AT NIGHT  5  . levothyroxine (SYNTHROID, LEVOTHROID) 75 MCG tablet TAKE 1 TABLET(75 MCG) BY MOUTH DAILY BEFORE BREAKFAST 90 tablet 0  .  Misc Natural Products (OSTEO BI-FLEX JOINT SHIELD PO) Take by mouth.    Marland Kitchen MISC NATURAL PRODUCTS PO Take by mouth. Peripheral nerve health    . Multiple Vitamins-Minerals (CENTRUM SILVER PO) Take by mouth.    . Multiple Vitamins-Minerals (PRESERVISION AREDS 2 PO) Take by mouth.    Vladimir Faster Glycol-Propyl Glycol (SYSTANE OP) Apply to eye.    . pravastatin (PRAVACHOL) 20 MG tablet TAKE 1 TABLET(20 MG) BY MOUTH DAILY 90 tablet 3  . loratadine (CLARITIN) 10 MG tablet Take 10 mg by mouth daily. Reported on 01/17/2015    . levothyroxine (SYNTHROID, LEVOTHROID) 75 MCG tablet TAKE 1 TABLET(75 MCG) BY MOUTH DAILY BEFORE BREAKFAST (Patient not taking: Reported on 04/15/2016) 90 tablet 0  . meloxicam (MOBIC) 15 MG tablet TK 1 T PO QD WC  2  . pravastatin (PRAVACHOL) 20 MG tablet TAKE 1 TABLET(20 MG) BY MOUTH DAILY (Patient not taking: Reported on 04/15/2016) 90 tablet 0   No facility-administered medications prior to visit.     Review of Systems;  Patient denies headache, fevers, malaise, , skin rash, eye pain, sinus congestion and sinus pain, sore throat, dysphagia,  hemoptysis , cough, dyspnea, wheezing, chest pain, palpitations, orthopnea, edema, abdominal pain, , melena, diarrhea, constipation, flank pain, dysuria, hematuria, urinary  Frequency, nocturia, numbness, tingling, seizures,  Focal weakness, Loss of consciousness,  Tremor, insomnia, depression, anxiety, and suicidal ideation.      Objective:  BP 122/70   Pulse Marland Kitchen)  56   Temp 98 F (36.7 C) (Oral)   Resp 15   Ht '4\' 11"'$  (1.499 m)   Wt 102 lb 9.6 oz (46.5 kg)   LMP  (LMP Unknown)   SpO2 98%   BMI 20.72 kg/m   BP Readings from Last 3 Encounters:  04/15/16 122/70  12/10/15 138/62  10/16/15 130/70    Wt Readings from Last 3 Encounters:  04/15/16 102 lb 9.6 oz (46.5 kg)  12/10/15 107 lb 12.8 oz (48.9 kg)  10/16/15 106 lb 8 oz (48.3 kg)    General appearance: alert, cooperative and appears stated age Ears: normal TM's and external  ear canals both ears Throat: lips, mucosa, and tongue normal; teeth and gums normal Neck: no adenopathy, no carotid bruit, supple, symmetrical, trachea midline and thyroid not enlarged, symmetric, no tenderness/mass/nodules Back: symmetric, no curvature. ROM normal. No CVA tenderness. Lungs: clear to auscultation bilaterally Heart: regular rate and rhythm, S1, S2 normal, no murmur, click, rub or gallop Abdomen: soft, non-tender; bowel sounds normal; no masses,  no organomegaly Pulses: 2+ and symmetric Skin: Skin color, texture, turgor normal. No rashes or lesions Lymph nodes: Cervical, supraclavicular, and axillary nodes normal.  Lab Results  Component Value Date   HGBA1C 5.7 07/16/2014    Lab Results  Component Value Date   CREATININE 0.78 04/15/2016   CREATININE 0.72 10/16/2015   CREATININE 0.73 01/17/2015    Lab Results  Component Value Date   WBC 8.3 04/15/2016   HGB 14.9 04/15/2016   HCT 45.0 04/15/2016   PLT 260.0 04/15/2016   GLUCOSE 92 04/15/2016   CHOL 161 04/15/2016   TRIG 56.0 04/15/2016   HDL 79.50 04/15/2016   LDLCALC 71 04/15/2016   ALT 13 04/15/2016   AST 17 04/15/2016   NA 141 04/15/2016   K 4.3 04/15/2016   CL 104 04/15/2016   CREATININE 0.78 04/15/2016   BUN 13 04/15/2016   CO2 33 (H) 04/15/2016   TSH 0.48 04/15/2016   HGBA1C 5.7 07/16/2014   MICROALBUR 1.7 10/16/2015    Dg Bone Density  Result Date: 12/22/2015 EXAM: DUAL X-RAY ABSORPTIOMETRY (DXA) FOR BONE MINERAL DENSITY IMPRESSION: Dear Dr. Derrel Nip, Your patient Jalisha Enneking completed a BMD test on 12/22/2015 using the [employer] (analysis version: 14.10) manufactured by [employer]. The following summarizes the results of our evaluation. PATIENT BIOGRAPHICAL: Name: Korrie, Hofbauer Patient ID: [id number] Birth Date: 06/09/1927 Height: 59.0 in. Gender: Female Exam Date: 12/22/2015 Weight: 105.8 lbs. Indications: Advanced Age, Height Loss, Hx of tobacco use, Postmenopausal Fractures:  Treatments: CALCIUM VIT D, claritin, Flonase, LEVOTHYROXINE, Multi-Vitamin with calcium, pravastatin ASSESSMENT: The BMD measured at Forearm Radius 33% is 0.567 g/cm2 with a T-score of -3.5. This patient is considered osteoporotic according to [facility]) criteria. Lumbar spine was not utilized due to advanced degenerative changes/scoliosis. Site Region Measured Measured WHO Young Adult BMD Date       Age      Classification T-score DualFemur Total Right 12/22/2015 86.6 Osteoporosis -2.8 0.656 g/cm2 Left Forearm Radius 33% 12/22/2015 86.6 Osteoporosis -3.5 0.567 g/cm2 World Health Organization [facility]) criteria for post-menopausal, Caucasian Women: Normal:       T-score at or above -1 SD Osteopenia:   T-score between -1 and -2.5 SD Osteoporosis: T-score at or below -2.5 SD RECOMMENDATIONS: [place] recommends that FDA-approved medical therapies be considered in postmenopausal women and men age 49 or older with a: 1. Hip or vertebral (clinical or morphometric) fracture. 2. T-score of < -  2.5 at the spine or hip. 3. Ten-year fracture probability by FRAX of 3% or greater for hip fracture or 20% or greater for major osteoporotic fracture. All treatment decisions require clinical judgment and consideration of individual patient factors, including patient preferences, co-morbidities, previous drug use, risk factors not captured in the FRAX model (e.g. falls, vitamin D deficiency, increased bone turnover, interval significant decline in bone density) and possible under - or over-estimation of fracture risk by FRAX. All patients should ensure an adequate intake of dietary calcium (1200 mg/d) and vitamin D (800 IU daily) unless contraindicated. FOLLOW-UP: People with diagnosed cases of osteoporosis or at high risk for fracture should have regular bone mineral density tests. For patients eligible for Medicare, routine testing is allowed once every 2 years. The testing frequency can be  increased to one year for patients who have rapidly progressing disease, those who are receiving or discontinuing medical therapy to restore bone mass, or have additional risk factors. I have reviewed this report, and agree with the above findings. [facility] Electronically Signed   By: Rolm Baptise M.D.   On: 12/22/2015 10:27    Assessment & Plan:   Problem List Items Addressed This Visit    Acquired hypothyroidism - Primary    Thyroid is overactive for age,  Which ay be contributing to her weight loss .Marland Kitchen  Reduce dose of levothyroxine to 50 mcg daily   Lab Results  Component Value Date   TSH 0.48 04/15/2016         Relevant Orders   TSH (Completed)   Hereditary and idiopathic peripheral neuropathy    No reversible causes identified by currnent labs. Trial of cymbalta       Relevant Medications   DULoxetine (CYMBALTA) 20 MG capsule   Osteoporosis    Diagnosis explained  Risks and benefits of various treatments discussed;  prolia recommended given her age       Pure hypercholesterolemia    LDL and triglycerides are at goal on current medications. She has no side effects and liver enzymes are normal. No changes today  Lab Results  Component Value Date   CHOL 161 04/15/2016   HDL 79.50 04/15/2016   LDLCALC 71 04/15/2016   TRIG 56.0 04/15/2016   CHOLHDL 2 04/15/2016   Lab Results  Component Value Date   ALT 13 04/15/2016   AST 17 04/15/2016   ALKPHOS 74 04/15/2016   BILITOT 0.7 04/15/2016           Relevant Medications   pravastatin (PRAVACHOL) 20 MG tablet   Other Relevant Orders   Lipid panel (Completed)   Vitamin D deficiency   Relevant Orders   VITAMIN D 25 Hydroxy (Vit-D Deficiency, Fractures) (Completed)    Other Visit Diagnoses    Other fatigue       Relevant Orders   Comprehensive metabolic panel (Completed)   CBC with Differential/Platelet (Completed)      I have discontinued Ms. Prochazka's meloxicam. I am also having her start on  DULoxetine. Additionally, I am having her maintain her Multiple Vitamins-Minerals (PRESERVISION AREDS 2 PO), Polyethyl Glycol-Propyl Glycol (SYSTANE OP), Multiple Vitamins-Minerals (CENTRUM SILVER PO), Calcium Citrate (CITRACAL PO), Misc Natural Products (OSTEO BI-FLEX JOINT SHIELD PO), loratadine, fluticasone, MISC NATURAL PRODUCTS PO, latanoprost, pravastatin, levothyroxine, and pravastatin.  Meds ordered this encounter  Medications  . pravastatin (PRAVACHOL) 20 MG tablet    Sig: TAKE 1 TABLET(20 MG) BY MOUTH DAILY    Dispense:  90 tablet    Refill:  1  . DULoxetine (CYMBALTA) 20 MG capsule    Sig: Take 1 capsule (20 mg total) by mouth daily.    Dispense:  30 capsule    Refill:  5    Medications Discontinued During This Encounter  Medication Reason  . levothyroxine (SYNTHROID, LEVOTHROID) 75 MCG tablet Duplicate  . meloxicam (MOBIC) 15 MG tablet Patient has not taken in last 30 days  . pravastatin (PRAVACHOL) 20 MG tablet Duplicate    Follow-up: No Follow-up on file.   Crecencio Mc, MD

## 2016-04-16 NOTE — Progress Notes (Signed)
Submitted Authorization form for Prolia, thanks

## 2016-04-17 NOTE — Assessment & Plan Note (Signed)
Thyroid is overactive for age,  Which ay be contributing to her weight loss .Marland Kitchen  Reduce dose of levothyroxine to 50 mcg daily   Lab Results  Component Value Date   TSH 0.48 04/15/2016

## 2016-04-17 NOTE — Assessment & Plan Note (Signed)
Diagnosis explained  Risks and benefits of various treatments discussed;  prolia recommended given her age

## 2016-04-17 NOTE — Assessment & Plan Note (Signed)
No reversible causes identified by currnent labs. Trial of cymbalta

## 2016-04-17 NOTE — Assessment & Plan Note (Signed)
LDL and triglycerides are at goal on current medications. She has no side effects and liver enzymes are normal. No changes today  Lab Results  Component Value Date   CHOL 161 04/15/2016   HDL 79.50 04/15/2016   LDLCALC 71 04/15/2016   TRIG 56.0 04/15/2016   CHOLHDL 2 04/15/2016   Lab Results  Component Value Date   ALT 13 04/15/2016   AST 17 04/15/2016   ALKPHOS 74 04/15/2016   BILITOT 0.7 04/15/2016

## 2016-04-18 ENCOUNTER — Encounter: Payer: Self-pay | Admitting: Internal Medicine

## 2016-04-18 ENCOUNTER — Other Ambulatory Visit: Payer: Self-pay | Admitting: Internal Medicine

## 2016-04-18 DIAGNOSIS — E039 Hypothyroidism, unspecified: Secondary | ICD-10-CM

## 2016-04-18 MED ORDER — LEVOTHYROXINE SODIUM 50 MCG PO TABS: 50.0000 ug | ORAL_TABLET | Freq: Every day | ORAL | 3 refills | Status: DC

## 2016-04-26 ENCOUNTER — Ambulatory Visit
Admission: RE | Admit: 2016-04-26 | Discharge: 2016-04-26 | Disposition: A | Discharge status: Home / Self Care | Payer: PPO | Source: Ambulatory Visit | Attending: Internal Medicine | Admitting: Internal Medicine

## 2016-04-26 DIAGNOSIS — Z1231 Encounter for screening mammogram for malignant neoplasm of breast: Secondary | ICD-10-CM | POA: Insufficient documentation

## 2016-05-28 ENCOUNTER — Other Ambulatory Visit: Payer: Self-pay | Admitting: Internal Medicine

## 2016-07-22 ENCOUNTER — Telehealth: Payer: Self-pay

## 2016-07-22 NOTE — Telephone Encounter (Signed)
Left VM to discuss prolia.

## 2016-07-27 DIAGNOSIS — H04123 Dry eye syndrome of bilateral lacrimal glands: Secondary | ICD-10-CM | POA: Diagnosis not present

## 2016-07-27 DIAGNOSIS — H401131 Primary open-angle glaucoma, bilateral, mild stage: Secondary | ICD-10-CM | POA: Diagnosis not present

## 2016-08-24 ENCOUNTER — Telehealth: Payer: Self-pay | Admitting: Radiology

## 2016-08-24 ENCOUNTER — Ambulatory Visit (INDEPENDENT_AMBULATORY_CARE_PROVIDER_SITE_OTHER): Payer: PPO

## 2016-08-24 ENCOUNTER — Encounter: Payer: Self-pay | Admitting: Family Medicine

## 2016-08-24 ENCOUNTER — Ambulatory Visit (INDEPENDENT_AMBULATORY_CARE_PROVIDER_SITE_OTHER): Payer: PPO | Admitting: Family Medicine

## 2016-08-24 VITALS — BP 160/58 | HR 60 | Temp 98.4°F | Wt 108.0 lb

## 2016-08-24 DIAGNOSIS — M791 Myalgia: Secondary | ICD-10-CM | POA: Diagnosis not present

## 2016-08-24 DIAGNOSIS — M47816 Spondylosis without myelopathy or radiculopathy, lumbar region: Secondary | ICD-10-CM | POA: Diagnosis not present

## 2016-08-24 DIAGNOSIS — M7918 Myalgia, other site: Secondary | ICD-10-CM

## 2016-08-24 DIAGNOSIS — S32512D Fracture of superior rim of left pubis, subsequent encounter for fracture with routine healing: Secondary | ICD-10-CM

## 2016-08-24 HISTORY — DX: Fracture of superior rim of left pubis, subsequent encounter for fracture with routine healing: S32.512D

## 2016-08-24 NOTE — Telephone Encounter (Signed)
Pt stated she could come in tomorrow morning around 9 for additional views of left hip

## 2016-08-24 NOTE — Progress Notes (Signed)
Subjective:  Patient ID: Gloria Carr, female    DOB: [birth year]  Age: 83 y.o. MRN: [id number]  CC: Buttock/groin pain  HPI:  83 year old female presents with the above complaints.   Patient reports that she fell while line dancing in July. Patient is a poor historian. She is unsure of the date. She is unsure of exactly how it happened and how she landed. She states that afterwards she got up and continued dancing. Later on she developed buttock and groin pain on the left side. She reports that her pain improves with activity and is worse after prolonged sitting or resting. No reports of numbness or tingling. Pain is currently moderate in severity. She has used some BenGay without significant improvement. No other associated symptoms. No other complaints or concerns at this time.  Social Hx   Social History   Social History  . Marital status: Widowed    Spouse name: N/A  . Number of children: N/A  . Years of education: N/A   Social History Main Topics  . Smoking status: Former Research scientist (life sciences)  . Smokeless tobacco: Never Used  . Alcohol use 3.0 oz/week    5 Standard drinks or equivalent per week     Comment: with dinner  . Drug use: No  . Sexual activity: No   Other Topics Concern  . None   Social History Narrative  . None    Review of Systems  Constitutional: Negative.   Musculoskeletal:       Buttock and groin pain.   Objective:  BP (!) 160/58 (BP Location: Right Arm, Patient Position: Sitting, Cuff Size: Normal)   Pulse 60   Temp 98.4 F (36.9 C) (Oral)   Wt 108 lb (49 kg)   LMP  (LMP Unknown)   SpO2 96%   BMI 21.81 kg/m   BP/Weight 08/24/2016 04/15/2016 84/13/2440  Systolic BP 102 725 366  Diastolic BP 58 70 62  Wt. (Lbs) 108 102.6 107.8  BMI 21.81 20.72 21.77    Physical Exam  Constitutional: She appears well-developed. No distress.  Cardiovascular: Normal rate and regular rhythm.   Pulmonary/Chest: Effort normal. She has no wheezes. She has no rales.    Musculoskeletal:  Low back - nontender to palpation. Negative straight leg raise. Patient does have tenderness in the middle of the buttock at the site of the piriformis muscle.  Left hip - patient has very good range of motion given her advanced age. Negative FADIR. ? Positive FABER. Mild trochanter tenderness.  Neurological: She is alert.  Psychiatric: She has a normal mood and affect.  Vitals reviewed.   Lab Results  Component Value Date   WBC 8.3 04/15/2016   HGB 14.9 04/15/2016   HCT 45.0 04/15/2016   PLT 260.0 04/15/2016   GLUCOSE 92 04/15/2016   CHOL 161 04/15/2016   TRIG 56.0 04/15/2016   HDL 79.50 04/15/2016   LDLCALC 71 04/15/2016   ALT 13 04/15/2016   AST 17 04/15/2016   NA 141 04/15/2016   K 4.3 04/15/2016   CL 104 04/15/2016   CREATININE 0.78 04/15/2016   BUN 13 04/15/2016   CO2 33 (H) 04/15/2016   TSH 0.48 04/15/2016   HGBA1C 5.7 07/16/2014   MICROALBUR 1.7 10/16/2015    Assessment & Plan:   Problem List Items Addressed This Visit    Buttock pain - Primary    Name problem. Started after a fall. Patient had a groin pain and buttock pain. I suspect that this is either  muscular in origin or is coming from the lumbar spine. She has very good range of motion of her hip. X-ray today for further evaluation. Supportive care with over-the-counter Tylenol as needed. May benefit from physical therapy pending x-ray findings.      Relevant Orders   DG Lumbar Spine Complete      Follow-up: PRN  [place]

## 2016-08-24 NOTE — Telephone Encounter (Signed)
See prev note

## 2016-08-24 NOTE — Telephone Encounter (Signed)
Called and left message for patient to return call regarding xray and to return for additional images for further evaluation. Please place xray orders. Thank you.

## 2016-08-24 NOTE — Telephone Encounter (Signed)
Entered in error  See other note

## 2016-08-24 NOTE — Patient Instructions (Signed)
This is likely muscular in origin.  We will call with the xray results.  Tylenol as needed.  You may benefit from physical therapy.  Take care  Dr. Lacinda Axon

## 2016-08-24 NOTE — Assessment & Plan Note (Signed)
Name problem. Started after a fall. Patient had a groin pain and buttock pain. I suspect that this is either muscular in origin or is coming from the lumbar spine. She has very good range of motion of her hip. X-ray today for further evaluation. Supportive care with over-the-counter Tylenol as needed. May benefit from physical therapy pending x-ray findings.

## 2016-08-25 ENCOUNTER — Other Ambulatory Visit: Payer: Self-pay | Admitting: Family Medicine

## 2016-08-25 ENCOUNTER — Ambulatory Visit (INDEPENDENT_AMBULATORY_CARE_PROVIDER_SITE_OTHER): Payer: PPO

## 2016-08-25 DIAGNOSIS — M791 Myalgia: Secondary | ICD-10-CM | POA: Diagnosis not present

## 2016-08-25 DIAGNOSIS — M7918 Myalgia, other site: Secondary | ICD-10-CM

## 2016-08-25 DIAGNOSIS — S32512A Fracture of superior rim of left pubis, initial encounter for closed fracture: Secondary | ICD-10-CM

## 2016-08-30 ENCOUNTER — Telehealth: Payer: Self-pay | Admitting: *Deleted

## 2016-08-30 DIAGNOSIS — S32592A Other specified fracture of left pubis, initial encounter for closed fracture: Secondary | ICD-10-CM | POA: Diagnosis not present

## 2016-08-30 NOTE — Telephone Encounter (Signed)
Yes, will make copy and put in folder up front.

## 2016-08-30 NOTE — Telephone Encounter (Signed)
Patient requested to have a copy of her Xray sent over to her referring appt.with the orthopedic  Pt contact [phone]

## 2016-08-30 NOTE — Telephone Encounter (Signed)
Can her xray bee put on a disc?

## 2016-08-30 NOTE — Telephone Encounter (Signed)
Left patient a vm with details that a xray disc can be made and it will be available upfront for pick up, thanks

## 2016-09-03 DIAGNOSIS — H01003 Unspecified blepharitis right eye, unspecified eyelid: Secondary | ICD-10-CM | POA: Diagnosis not present

## 2016-09-30 DIAGNOSIS — H01003 Unspecified blepharitis right eye, unspecified eyelid: Secondary | ICD-10-CM | POA: Diagnosis not present

## 2016-10-01 DIAGNOSIS — S32592A Other specified fracture of left pubis, initial encounter for closed fracture: Secondary | ICD-10-CM | POA: Diagnosis not present

## 2016-10-09 ENCOUNTER — Other Ambulatory Visit: Payer: Self-pay | Admitting: Internal Medicine

## 2016-10-12 DIAGNOSIS — L821 Other seborrheic keratosis: Secondary | ICD-10-CM | POA: Diagnosis not present

## 2016-10-12 DIAGNOSIS — D485 Neoplasm of uncertain behavior of skin: Secondary | ICD-10-CM | POA: Diagnosis not present

## 2016-10-12 DIAGNOSIS — D225 Melanocytic nevi of trunk: Secondary | ICD-10-CM | POA: Diagnosis not present

## 2016-10-12 DIAGNOSIS — L905 Scar conditions and fibrosis of skin: Secondary | ICD-10-CM | POA: Diagnosis not present

## 2016-11-05 ENCOUNTER — Telehealth: Payer: Self-pay | Admitting: Internal Medicine

## 2016-11-05 NOTE — Telephone Encounter (Signed)
LMTCB

## 2016-11-05 NOTE — Telephone Encounter (Signed)
Pt called about the medication of DULoxetine (CYMBALTA) 20 MG capsule she states it makes her sleepy and she's not sure what it's for. Please advise?  Call pt @ [phone].

## 2016-11-08 NOTE — Telephone Encounter (Signed)
I can not prescribe until I see her.  please give her 4:30 or an 11:30

## 2016-11-08 NOTE — Telephone Encounter (Signed)
Spoke with pt and she wondering if she still needs to be taking the cymbalta. I explained to the pat that it was prescribed to her for neuropathy and she stated that she is still having the pain in her buttocks that runs down her leg. Pt state that she doesn't think the medication is helping any. Does she need to continue taking or is there something else that she can try. Pt has an appt scheduled for 12/14/2016.

## 2016-11-08 NOTE — Telephone Encounter (Signed)
LMTCB

## 2016-11-09 ENCOUNTER — Other Ambulatory Visit: Payer: Self-pay | Admitting: Internal Medicine

## 2016-11-15 DIAGNOSIS — L720 Epidermal cyst: Secondary | ICD-10-CM | POA: Diagnosis not present

## 2016-11-15 DIAGNOSIS — L821 Other seborrheic keratosis: Secondary | ICD-10-CM | POA: Diagnosis not present

## 2016-11-15 DIAGNOSIS — D225 Melanocytic nevi of trunk: Secondary | ICD-10-CM | POA: Diagnosis not present

## 2016-11-15 DIAGNOSIS — D2262 Melanocytic nevi of left upper limb, including shoulder: Secondary | ICD-10-CM | POA: Diagnosis not present

## 2016-11-19 NOTE — Telephone Encounter (Signed)
Can you please schedule pt for 11:30am with Dr. Derrel Nip on 11/25/2016. Please and thank you. Its for follow up buttocks pain.

## 2016-11-22 NOTE — Telephone Encounter (Signed)
Patient scheduled.

## 2016-11-25 ENCOUNTER — Ambulatory Visit: Payer: PPO | Admitting: Internal Medicine

## 2016-11-25 ENCOUNTER — Encounter: Payer: Self-pay | Admitting: Internal Medicine

## 2016-11-25 VITALS — BP 156/78 | HR 64 | Temp 97.6°F | Resp 16 | Ht 59.0 in | Wt 107.4 lb

## 2016-11-25 DIAGNOSIS — M5416 Radiculopathy, lumbar region: Secondary | ICD-10-CM | POA: Diagnosis not present

## 2016-11-25 DIAGNOSIS — M8000XS Age-related osteoporosis with current pathological fracture, unspecified site, sequela: Secondary | ICD-10-CM | POA: Diagnosis not present

## 2016-11-25 DIAGNOSIS — E559 Vitamin D deficiency, unspecified: Secondary | ICD-10-CM

## 2016-11-25 DIAGNOSIS — E039 Hypothyroidism, unspecified: Secondary | ICD-10-CM | POA: Diagnosis not present

## 2016-11-25 DIAGNOSIS — G609 Hereditary and idiopathic neuropathy, unspecified: Secondary | ICD-10-CM

## 2016-11-25 DIAGNOSIS — Z79899 Other long term (current) drug therapy: Secondary | ICD-10-CM | POA: Diagnosis not present

## 2016-11-25 DIAGNOSIS — R030 Elevated blood-pressure reading, without diagnosis of hypertension: Secondary | ICD-10-CM

## 2016-11-25 DIAGNOSIS — S32512D Fracture of superior rim of left pubis, subsequent encounter for fracture with routine healing: Secondary | ICD-10-CM

## 2016-11-25 LAB — COMPREHENSIVE METABOLIC PANEL
ALK PHOS: 74 U/L (ref 39–117)
ALT: 15 U/L (ref 0–35)
AST: 21 U/L (ref 0–37)
Albumin: 3.9 g/dL (ref 3.5–5.2)
BILIRUBIN TOTAL: 0.6 mg/dL (ref 0.2–1.2)
BUN: 14 mg/dL (ref 6–23)
CO2: 33 mEq/L — ABNORMAL HIGH (ref 19–32)
CREATININE: 0.74 mg/dL (ref 0.40–1.20)
Calcium: 9.7 mg/dL (ref 8.4–10.5)
Chloride: 102 mEq/L (ref 96–112)
GFR: 78.79 mL/min (ref >60.00)
GLUCOSE: 82 mg/dL (ref 70–99)
Potassium: 4.3 mEq/L (ref 3.5–5.1)
SODIUM: 141 meq/L (ref 135–145)
TOTAL PROTEIN: 6.7 g/dL (ref 6.0–8.3)

## 2016-11-25 LAB — MICROALBUMIN / CREATININE URINE RATIO
CREATININE, U: 28.7 mg/dL
Microalb Creat Ratio: 2.4 mg/g (ref 0.0–30.0)

## 2016-11-25 MED ORDER — TRAMADOL HCL 50 MG PO TABS: 50.0000 mg | ORAL_TABLET | Freq: Two times a day (BID) | ORAL | 2 refills | Status: DC

## 2016-11-25 MED ORDER — DENOSUMAB 60 MG/ML ~~LOC~~ SOLN: 60.0000 mg | SUBCUTANEOUS | 0 refills | Status: DC

## 2016-11-25 NOTE — Progress Notes (Signed)
Subjective:  Patient ID: Gloria Carr, female    DOB: 1927/03/23  Age: 83 y.o. MRN: [id number]  CC: The primary encounter diagnosis was Acquired hypothyroidism. Diagnoses of Long-term use of high-risk medication, Elevated blood-pressure reading without diagnosis of hypertension, Vitamin D deficiency, Lumbar radiculopathy, Closed fracture of left superior pubic ramus, with routine healing, subsequent encounter, Age-related osteoporosis with current pathological fracture, sequela, and Hereditary and idiopathic peripheral neuropathy were also pertinent to this visit.  HPI Gloria Carr presents for follow up on chronic  Issues.  Last seen in August by Dr Lacinda Axon for several week history of  buttock pain after falling while participating in a  line dancing.  X rays were ordered and  A Superior pubic ramus pelvic fracture was noted . Patient was referred to [facility] , was treated conservatively.   Returned to Ortho in Sept  Reporting  Persistent right buttock pain that was treated as compensatory and prn follow up was planned.  Patient  states that she is still having right  Buttock pain which she describes as sciatic pain because it starts in the lower back, concentrates in the right buttock and radiates to the knee . It is aggravated by rest and  Inactivity and improves with walking , but is never completely resolved.  She has not  resumed line dancing, aerobics or tai chi .  She is participating in a program  called "better balance" which does cause the pain to get worse.  Stopped taking tramadol after she was told the pelvic fracture had healed, and using just tylenol with incomplete/inadequate relief.   Also c/o left thumb pain at the mcp joint,  fingers getting stiff as well  Chronic neuropathy affecting both feet .  Has not noted any improvement with cymbalta.  She denies pain in her feet, states that they just feel numb.    Outpatient Medications Prior to Visit  Medication Sig Dispense Refill  .  fluticasone (FLONASE) 50 MCG/ACT nasal spray Place into both nostrils daily. Reported on 01/17/2015    . latanoprost (XALATAN) 0.005 % ophthalmic solution INSTILL 1 DROP IN THE LEFT EYE AT NIGHT  5  . levothyroxine (SYNTHROID, LEVOTHROID) 50 MCG tablet Take 1 tablet (50 mcg total) by mouth daily. 90 tablet 3  . loratadine (CLARITIN) 10 MG tablet Take 10 mg by mouth daily. Reported on 01/17/2015    . Misc Natural Products (OSTEO BI-FLEX JOINT SHIELD PO) Take by mouth.    Marland Kitchen MISC NATURAL PRODUCTS PO Take by mouth. Peripheral nerve health    . Multiple Vitamins-Minerals (CENTRUM SILVER PO) Take by mouth.    . Multiple Vitamins-Minerals (PRESERVISION AREDS 2 PO) Take by mouth.    Vladimir Faster Glycol-Propyl Glycol (SYSTANE OP) Apply to eye.    . pravastatin (PRAVACHOL) 20 MG tablet TAKE 1 TABLET(20 MG) BY MOUTH DAILY 90 tablet 0  . DULoxetine (CYMBALTA) 20 MG capsule TAKE 1 CAPSULE(20 MG) BY MOUTH DAILY 30 capsule 0  . pravastatin (PRAVACHOL) 20 MG tablet TAKE 1 TABLET(20 MG) BY MOUTH DAILY 90 tablet 3   No facility-administered medications prior to visit.     Review of Systems;  Patient denies headache, fevers, malaise, unintentional weight loss, skin rash, eye pain, sinus congestion and sinus pain, sore throat, dysphagia,  hemoptysis , cough, dyspnea, wheezing, chest pain, palpitations, orthopnea, edema, abdominal pain, nausea, melena, diarrhea, constipation, flank pain, dysuria, hematuria, urinary  Frequency, nocturia, numbness, tingling, seizures,  Focal weakness, Loss of consciousness,  Tremor, insomnia, depression, anxiety, and  suicidal ideation.      Objective:  BP (!) 156/78 (BP Location: Left Arm, Patient Position: Sitting, Cuff Size: Normal)   Pulse 64   Temp 97.6 F (36.4 C) (Oral)   Resp 16   Ht _0  (1.499 m)   Wt 107 lb 6.4 oz (48.7 kg)   LMP  (LMP Unknown)   SpO2 96%   BMI 21.69 kg/m   BP Readings from Last 3 Encounters:  11/25/16 (!) 156/78  08/24/16 (!) 160/58  04/15/16  122/70    Wt Readings from Last 3 Encounters:  11/25/16 107 lb 6.4 oz (48.7 kg)  08/24/16 108 lb (49 kg)  04/15/16 102 lb 9.6 oz (46.5 kg)    General appearance: alert, cooperative and appears stated age Ears: normal TM's and external ear canals both ears Throat: lips, mucosa, and tongue normal; teeth and gums normal Neck: no adenopathy, no carotid bruit, supple, symmetrical, trachea midline and thyroid not enlarged, symmetric, no tenderness/mass/nodules Back: symmetric, no curvature. ROM normal. No CVA tenderness. Lungs: clear to auscultation bilaterally Heart: regular rate and rhythm, S1, S2 normal, no murmur, click, rub or gallop Abdomen: soft, non-tender; bowel sounds normal; no masses,  no organomegaly Pulses: 2+ and symmetric Skin: Skin color, texture, turgor normal. No rashes or lesions Lymph nodes: Cervical, supraclavicular, and axillary nodes normal. Neuro: CNs 2-12 intact. DTRs 2+/4 in biceps, brachioradialis, 1+ in patellars and achilles. Muscle strength 5/5 in upper and lower exremities. Positive straight leg lift on the right. Fine resting tremor bilaterally both hands cerebellar function normal. Romberg negative.  No pronator drift.   Gait normal.   Lab Results  Component Value Date   HGBA1C 5.7 07/16/2014    Lab Results  Component Value Date   CREATININE 0.74 11/25/2016   CREATININE 0.78 04/15/2016   CREATININE 0.72 10/16/2015    Lab Results  Component Value Date   WBC 8.3 04/15/2016   HGB 14.9 04/15/2016   HCT 45.0 04/15/2016   PLT 260.0 04/15/2016   GLUCOSE 82 11/25/2016   CHOL 161 04/15/2016   TRIG 56.0 04/15/2016   HDL 79.50 04/15/2016   LDLCALC 71 04/15/2016   ALT 15 11/25/2016   AST 21 11/25/2016   NA 141 11/25/2016   K 4.3 11/25/2016   CL 102 11/25/2016   CREATININE 0.74 11/25/2016   BUN 14 11/25/2016   CO2 33 (H) 11/25/2016   TSH 2.10 11/25/2016   HGBA1C 5.7 07/16/2014   MICROALBUR <0.7 11/25/2016    Mm Digital Screening  Bilateral  Result Date: 04/26/2016 CLINICAL DATA:  Screening. EXAM: DIGITAL SCREENING BILATERAL MAMMOGRAM WITH CAD COMPARISON:  Previous exam(s). ACR Breast Density Category b: There are scattered areas of fibroglandular density. FINDINGS: There are no findings suspicious for malignancy. Images were processed with CAD. IMPRESSION: No mammographic evidence of malignancy. A result letter of this screening mammogram will be mailed directly to the patient. RECOMMENDATION: Screening mammogram in one year. (Code:SM-B-01Y) BI-RADS CATEGORY  1: Negative. Electronically Signed   By: Fidela Salisbury M.D.   On: 04/26/2016 13:24    Assessment & Plan:   Problem List Items Addressed This Visit    Acquired hypothyroidism - Primary   Relevant Orders   TSH (Completed)   Closed fracture of left superior pubic ramus, with routine healing, subsequent encounter    Occurred in August after falling during a line dance activity.  Her fracture has healed per Orthopedics follow up but she continues to have daily persistent pain that radiates down right leg.  MRI ordered .  Refill tramadol to use prn       Elevated blood-pressure reading without diagnosis of hypertension    She has no history of hypertension but has had several elevated readings.  She has been asked to check her pressures at home and submit readings for evaluation. Renal function and UA are normal today  Lab Results  Component Value Date   CREATININE 0.74 11/25/2016   Lab Results  Component Value Date   MICROALBUR <0.7 11/25/2016         Relevant Orders   Microalbumin / creatinine urine ratio (Completed)   Hereditary and idiopathic peripheral neuropathy    She has had no appreciable improvement in symptoms with cymbalta  Denies pain.  cymbalta wean outlined.       Osteoporosis    Diagnosis reviewed again.  Risks and benefits of various treatments discussed;  prolia recommended given her age and recent superior pubic ramus fracture.       Relevant Medications   denosumab (PROLIA) 60 MG/ML SOLN injection   Vitamin D deficiency   Relevant Orders   VITAMIN D 25 Hydroxy (Vit-D Deficiency, Fractures) (Completed)    Other Visit Diagnoses    Long-term use of high-risk medication       Relevant Orders   Comprehensive metabolic panel (Completed)   Lumbar radiculopathy       Relevant Orders   MR Lumbar Spine Wo Contrast      I have discontinued Keilyn Buchner's DULoxetine. I am also having her start on traMADol and denosumab. Additionally, I am having her maintain her Multiple Vitamins-Minerals (PRESERVISION AREDS 2 PO), Polyethyl Glycol-Propyl Glycol (SYSTANE OP), Multiple Vitamins-Minerals (CENTRUM SILVER PO), Misc Natural Products (OSTEO BI-FLEX JOINT SHIELD PO), loratadine, fluticasone, MISC NATURAL PRODUCTS PO, latanoprost, levothyroxine, and pravastatin.  Meds ordered this encounter  Medications  . traMADol (ULTRAM) 50 MG tablet    Sig: Take 1 tablet (50 mg total) 2 (two) times daily by mouth. As needed for moderate pain due to sciatica    Dispense:  60 tablet    Refill:  2  . denosumab (PROLIA) 60 MG/ML SOLN injection    Sig: Inject 60 mg every 6 (six) months into the skin. Administer in upper arm, thigh, or abdomen    Dispense:  1 mL    Refill:  0   A total of 25 minutes of face to face time was spent with patient more than half of which was spent in counselling about the above mentioned conditions  and coordination of care   Medications Discontinued During This Encounter  Medication Reason  . DULoxetine (CYMBALTA) 20 MG capsule   . pravastatin (PRAVACHOL) 20 MG tablet     Follow-up: Return in about 6 months (around 05/25/2017).   Crecencio Mc, MD

## 2016-11-25 NOTE — Patient Instructions (Addendum)
Since you don't think the cymbalta (duloxetine ) has helped your nerve problem,  We are stopping the duloxetine after  a gradual taper:    Take one duloxetine  every other day for one week,  Then stop   We are resuming the tramadol for your pain , you can use it up to twice daily for your sciatica You can take aleve once daily and tylenol up to 4 daily as well. (these are OTC)   I am ordering an MRI of your lumbar spine to make sure you do not have a pinched nerve.   You have osteoporosis!  I am going  To try get your insurance to pay for Prolia as treatment  Please get your blood pressure  Checked at your pharmacy 3 times over the next 2 weeks and send me the readings

## 2016-11-26 LAB — TSH: TSH: 2.1 u[IU]/mL (ref 0.35–4.50)

## 2016-11-26 LAB — VITAMIN D 25 HYDROXY (VIT D DEFICIENCY, FRACTURES): VITD: 28.16 ng/mL — ABNORMAL LOW (ref 30.00–100.00)

## 2016-11-27 ENCOUNTER — Encounter: Payer: Self-pay | Admitting: Internal Medicine

## 2016-11-27 DIAGNOSIS — I10 Essential (primary) hypertension: Secondary | ICD-10-CM | POA: Insufficient documentation

## 2016-11-27 NOTE — Assessment & Plan Note (Signed)
She has no history of hypertension but has had several elevated readings.  She has been asked to check her pressures at home and submit readings for evaluation. Renal function and UA are normal today  Lab Results  Component Value Date   CREATININE 0.74 11/25/2016   Lab Results  Component Value Date   MICROALBUR <0.7 11/25/2016

## 2016-11-27 NOTE — Assessment & Plan Note (Signed)
She has had no appreciable improvement in symptoms with cymbalta  Denies pain.  cymbalta wean outlined.

## 2016-11-27 NOTE — Assessment & Plan Note (Addendum)
Occurred in August after falling during a line dance activity.  Her fracture has healed per Orthopedics follow up but she continues to have daily persistent pain that radiates down right leg.  MRI ordered .  Refill tramadol to use prn

## 2016-11-27 NOTE — Assessment & Plan Note (Signed)
Diagnosis reviewed again.  Risks and benefits of various treatments discussed;  prolia recommended given her age and recent superior pubic ramus fracture.

## 2016-12-06 ENCOUNTER — Encounter: Payer: Self-pay | Admitting: Internal Medicine

## 2016-12-06 ENCOUNTER — Ambulatory Visit
Admission: RE | Admit: 2016-12-06 | Discharge: 2016-12-06 | Disposition: A | Discharge status: Home / Self Care | Payer: PPO | Source: Ambulatory Visit | Attending: Internal Medicine | Admitting: Internal Medicine

## 2016-12-06 DIAGNOSIS — M5416 Radiculopathy, lumbar region: Secondary | ICD-10-CM

## 2016-12-06 DIAGNOSIS — M5116 Intervertebral disc disorders with radiculopathy, lumbar region: Secondary | ICD-10-CM | POA: Insufficient documentation

## 2016-12-06 DIAGNOSIS — M48061 Spinal stenosis, lumbar region without neurogenic claudication: Secondary | ICD-10-CM | POA: Diagnosis not present

## 2016-12-09 ENCOUNTER — Ambulatory Visit (INDEPENDENT_AMBULATORY_CARE_PROVIDER_SITE_OTHER): Payer: PPO

## 2016-12-09 VITALS — BP 152/70 | HR 65 | Temp 98.1°F | Resp 16 | Ht 58.75 in | Wt 107.0 lb

## 2016-12-09 DIAGNOSIS — Z0000 Encounter for general adult medical examination without abnormal findings: Secondary | ICD-10-CM

## 2016-12-09 DIAGNOSIS — Z1331 Encounter for screening for depression: Secondary | ICD-10-CM

## 2016-12-09 NOTE — Patient Instructions (Addendum)
  Gloria Carr , Thank you for taking time to come for your Medicare Wellness Visit. I appreciate your ongoing commitment to your health goals. Please review the following plan we discussed and let me know if I can assist you in the future.   Follow up with Dr. Derrel Nip if you would like a referral to Neurosurgery for consultation regarding options for treating pain due to degenerative changes.  Bring a copy of your [place] and/or Living Will to be scanned into chart.  Have a great day!  These are the goals we discussed: Goals    . Increase physical activity     Tai-Chi Dance for exercise as tolerated       This is a list of the screening recommended for you and due dates:  Health Maintenance  Topic Date Due  . Tetanus Vaccine  12/10/2025  . Flu Shot  Completed  . DEXA scan (bone density measurement)  Completed  . Pneumonia vaccines  Completed

## 2016-12-09 NOTE — Progress Notes (Signed)
Subjective:   Danashia Landers is a 83 y.o. female who presents for Medicare Annual (Subsequent) preventive examination.  Review of Systems:  No ROS.  Medicare Wellness Visit. Additional risk factors are reflected in the social history.  Cardiac Risk Factors include: advanced age (>68mn, >>19women)     Objective:     Vitals: BP (!) 152/70 (BP Location: Left Arm, Patient Position: Sitting, Cuff Size: Normal)   Pulse 65   Temp 98.1 F (36.7 C)   Resp 16   Ht 4' 10.75" (1.492 m)   Wt 107 lb (48.5 kg)   LMP  (LMP Unknown)   SpO2 96%   BMI 21.80 kg/m   Body mass index is 21.8 kg/m.   Tobacco Social History   Tobacco Use  Smoking Status Former Smoker  Smokeless Tobacco Never Used     Counseling given: Not Answered   Past Medical History:  Diagnosis Date  . Allergy   . Arthritis   . Glaucoma   . History of chickenpox   . History of recurrent UTIs   . Thyroid disease    Past Surgical History:  Procedure Laterality Date  . BILATERAL CARPAL TUNNEL RELEASE  1999-2000  . CATARACT EXTRACTION, BILATERAL  2013   Family History  Problem Relation Age of Onset  . Stroke Mother   . Cancer Mother        colon  . Cancer Father        bone  . Cancer Son        Jaw   Social History   Substance and Sexual Activity  Sexual Activity No    Outpatient Encounter Medications as of 12/09/2016  Medication Sig  . fluticasone (FLONASE) 50 MCG/ACT nasal spray Place into both nostrils daily. Reported on 01/17/2015  . latanoprost (XALATAN) 0.005 % ophthalmic solution INSTILL 1 DROP IN THE LEFT EYE AT NIGHT  . levothyroxine (SYNTHROID, LEVOTHROID) 50 MCG tablet Take 1 tablet (50 mcg total) by mouth daily.  . Misc Natural Products (OSTEO BI-FLEX JOINT SHIELD PO) Take by mouth.  .Marland KitchenMISC NATURAL PRODUCTS PO Take by mouth. Peripheral nerve health  . Multiple Vitamins-Minerals (CENTRUM SILVER PO) Take by mouth.  . Multiple Vitamins-Minerals (PRESERVISION AREDS 2 PO) Take by mouth.  .Vladimir FasterGlycol-Propyl Glycol (SYSTANE OP) Apply to eye.  . pravastatin (PRAVACHOL) 20 MG tablet TAKE 1 TABLET(20 MG) BY MOUTH DAILY  . denosumab (PROLIA) 60 MG/ML SOLN injection Inject 60 mg every 6 (six) months into the skin. Administer in upper arm, thigh, or abdomen (Patient not taking: Reported on 12/09/2016)  . loratadine (CLARITIN) 10 MG tablet Take 10 mg by mouth daily. Reported on 01/17/2015  . traMADol (ULTRAM) 50 MG tablet Take 1 tablet (50 mg total) 2 (two) times daily by mouth. As needed for moderate pain due to sciatica (Patient not taking: Reported on 12/09/2016)   No facility-administered encounter medications on file as of 12/09/2016.     Activities of Daily Living In your present state of health, do you have any difficulty performing the following activities: 12/09/2016 12/10/2015  Hearing? N N  Vision? Y N  Comment C/O glasses not working as well and plans to see her ophthalmologist. -  Difficulty concentrating or making decisions? Y Y  Comment Memory delay Age appropriate memory changes; memory delay  Walking or climbing stairs? Y N  Comment Unsteady gait.  Pace self. -  Dressing or bathing? N N  Doing errands, shopping? N N  Preparing Food and eating ?  N N  Using the Toilet? N N  In the past six months, have you accidently leaked urine? Y Y  Comment Managed with a daily pad Wears a pad for leaks as needed  Do you have problems with loss of bowel control? N N  Managing your Medications? N N  Managing your Finances? N N  Housekeeping or managing your Housekeeping? N N  Some recent data might be hidden    Patient Care Team: Crecencio Mc, MD as PCP - General (Internal Medicine)    Assessment:    This is a routine wellness examination for [place]. The goal of the wellness visit is to assist the patient how to close the gaps in care and create a preventative care plan for the patient.   The roster of all physicians providing medical care to patient is listed in  the Snapshot section of the chart.  Osteoporosis reviewed.  Awaiting feedback regarding insurance coverage for Prolia injections.  Safety issues reviewed; Smoke and carbon monoxide detectors in the home. No firearms in the home.  Wears seatbelts when driving or riding with others. Patient does wear sunscreen or protective clothing when in direct sunlight. No violence in the home.  Depression- PHQ 2 &9 complete.  No signs/symptoms or verbal communication regarding little pleasure in doing things, feeling down, depressed or hopeless. No changes in sleeping, energy, eating, concentrating.  No thoughts of self harm or harm towards others.  Time spent on this topic is 10 minutes.   Patient is alert, normal appearance, oriented to person/place/and time.  Correctly identified the president of the [place], recall of 1/3 words, and performing simple calculations. Displays appropriate judgement and can read correct time from watch face.   No new identified risk were noted.  No failures at ADL's or IADL's.    BMI- discussed the importance of a healthy diet, water intake and the benefits of aerobic exercise. Educational material provided.   24 hour diet recall: Breakfast: cereal, fruit Lunch: deli sandwich on rye bread, cottage cheese Dinner: egg plant, spaghetti  Snack: apples Daily fluid intake: 1 cups of caffeine, 2 cups of water, encouraged increase of water intake  Dental- every 6 months.  Eye- Visual acuity not assessed per patient preference since they have regular follow up with the ophthalmologist.  Glaucoma; drops in use. Wears corrective lenses.  Sleep patterns- Sleeps 8 hours at night.  Wakes feeling rested.  Health maintenance gaps- closed.  Patient Concerns: None at this time. Follow up with PCP as needed.  Exercise Activities and Dietary recommendations Current Exercise Habits: The patient does not participate in regular exercise at present  Goals    . Increase physical  activity     Tai-Chi Dance for exercise as tolerated      Fall Risk Fall Risk  12/09/2016 12/10/2015 12/10/2014  Falls in the past year? Yes No Yes  Comment - - Tripped  Number falls in past yr: 1 - -  Injury with Fall? Yes - No  Follow up Falls prevention discussed;Education provided - -   Depression Screen PHQ 2/9 Scores 12/09/2016 12/10/2015 12/10/2014  PHQ - 2 Score 0 0 0     Cognitive Function MMSE - Mini Mental State Exam 12/09/2016 12/10/2014  Orientation to time 5 5  Orientation to Place 5 5  Registration 3 3  Attention/ Calculation 5 5  Recall 2 3  Language- name 2 objects 2 2  Language- repeat 1 1  Language- follow 3 step command 3  3  Language- read & follow direction 1 1  Write a sentence 1 1  Copy design 1 1  Total score 29 30     6CIT Screen 12/10/2015  What Year? 0 points  What month? 0 points  What time? 0 points  Count back from 20 0 points  Months in reverse 0 points  Repeat phrase 2 points  Total Score 2    Immunization History  Administered Date(s) Administered  . Influenza, High Dose Seasonal PF 10/16/2015  . Influenza-Unspecified 09/12/2014, 09/27/2016  . Pneumococcal Conjugate-13 01/17/2015  . Pneumococcal Polysaccharide-23 01/17/2011  . Td 12/11/2015  . Tdap 12/11/2015, 12/11/2015  . Zoster 07/15/2012   Screening Tests Health Maintenance  Topic Date Due  . TETANUS/TDAP  12/10/2025  . INFLUENZA VACCINE  Completed  . DEXA SCAN  Completed  . PNA vac Low Risk Adult  Completed      Plan:    End of life planning; Advance aging; Advanced directives discussed. Copy of current HCPOA/Living Will requested.    Follow up with Dr. Derrel Nip if you would like a referral to Neurosurgery for consultation regarding options for treating pain due to degenerative changes.  I have personally reviewed and noted the following in the patient's chart:   . Medical and social history . Use of alcohol, tobacco or illicit drugs  . Current medications  and supplements . Functional ability and status . Nutritional status . Physical activity . Advanced directives . List of other physicians . Hospitalizations, surgeries, and ER visits in previous 12 months . Vitals . Screenings to include cognitive, depression, and falls . Referrals and appointments  In addition, I have reviewed and discussed with patient certain preventive protocols, quality metrics, and best practice recommendations. A written personalized care plan for preventive services as well as general preventive health recommendations were provided to patient.     Varney Biles, LPN  [id number]

## 2016-12-14 ENCOUNTER — Encounter: Payer: Self-pay | Admitting: Internal Medicine

## 2016-12-14 ENCOUNTER — Ambulatory Visit (INDEPENDENT_AMBULATORY_CARE_PROVIDER_SITE_OTHER): Payer: PPO | Admitting: Internal Medicine

## 2016-12-14 DIAGNOSIS — M48061 Spinal stenosis, lumbar region without neurogenic claudication: Secondary | ICD-10-CM | POA: Diagnosis not present

## 2016-12-14 DIAGNOSIS — R030 Elevated blood-pressure reading, without diagnosis of hypertension: Secondary | ICD-10-CM

## 2016-12-14 DIAGNOSIS — S32512D Fracture of superior rim of left pubis, subsequent encounter for fracture with routine healing: Secondary | ICD-10-CM

## 2016-12-14 NOTE — Progress Notes (Signed)
Subjective:  Patient ID: Gloria Carr, female    DOB: 11/04/1927  Age: 83 y.o. MRN: [id number]  CC: Diagnoses of Spinal stenosis of lumbar region without neurogenic claudication, Closed fracture of left superior pubic ramus, with routine healing, subsequent encounter, and Elevated blood-pressure reading without diagnosis of hypertension were pertinent to this visit.  HPI Gloria Carr presents for follow up on recent MRI lumbar spine ordered at last visit for persistent buttock pain following a fall and fracture  Multilevel degenerative changes resulting in Severe spinal stenosis was noted at the 4-5 level  And mild stenosis at other levels was noted.  She states today that her pain has improved .  She is no longer taking tramadol .  Took Aleve 2 nights ago.  Gets "knocked out" by all pain meds, including aleve and tylenol. Pain improves with walking .   Her chief complaint today is that she is experiencing  a "clunking " sound/feeling  in her head.  Cannot describe it any other way,  The sound/feeling occurs intermittently thoughout the day. .  NOt associated with pain, vision changes, dizziness, shortness of breath, or neck pain. . Does not occur while asleep. Described as "annoying"'  But not associated with visual changes,  No dizziness n neck pain    Participates in a balance class 2 /week .  No longer doing Tai Chi . Family wants her to stop driving but she has not had any accidents.    Mild cognitive impairment limited to finding the doctor's office and remembering names of new additions to [place].  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      Outpatient Medications Prior to Visit  Medication Sig Dispense Refill  . latanoprost (XALATAN) 0.005 % ophthalmic solution INSTILL 1 DROP IN THE LEFT EYE AT NIGHT  5  . levothyroxine (SYNTHROID, LEVOTHROID) 50 MCG tablet Take 1 tablet (50 mcg  total) by mouth daily. 90 tablet 3  . Misc Natural Products (OSTEO BI-FLEX JOINT SHIELD PO) Take by mouth.    Marland Kitchen MISC NATURAL PRODUCTS PO Take by mouth. Peripheral nerve health    . Multiple Vitamins-Minerals (CENTRUM SILVER PO) Take by mouth.    . Multiple Vitamins-Minerals (PRESERVISION AREDS 2 PO) Take by mouth.    Vladimir Faster Glycol-Propyl Glycol (SYSTANE OP) Apply to eye.    . pravastatin (PRAVACHOL) 20 MG tablet TAKE 1 TABLET(20 MG) BY MOUTH DAILY 90 tablet 0  . traMADol (ULTRAM) 50 MG tablet Take 1 tablet (50 mg total) 2 (two) times daily by mouth. As needed for moderate pain due to sciatica 60 tablet 2  . denosumab (PROLIA) 60 MG/ML SOLN injection Inject 60 mg every 6 (six) months into the skin. Administer in upper arm, thigh, or abdomen (Patient not taking: Reported on 12/09/2016) 1 mL 0  . fluticasone (FLONASE) 50 MCG/ACT nasal spray Place into both nostrils daily. Reported on 01/17/2015    . loratadine (CLARITIN) 10 MG tablet Take 10 mg by mouth daily. Reported on 01/17/2015     No facility-administered medications prior to visit.     Review of Systems;  Patient denies headache, fevers, malaise, unintentional weight loss, skin rash, eye pain, sinus congestion and sinus pain, sore throat, dysphagia,  hemoptysis , cough, dyspnea, wheezing, chest pain, palpitations, orthopnea, edema, abdominal pain, nausea, melena, diarrhea, constipation, flank pain, dysuria, hematuria, urinary  Frequency,  nocturia, numbness, tingling, seizures,  Focal weakness, Loss of consciousness,  Tremor, insomnia, depression, anxiety, and suicidal ideation.      Objective:  BP 130/78 (BP Location: Left Arm, Patient Position: Sitting, Cuff Size: Normal)   Pulse 67   Temp 98 F (36.7 C) (Oral)   Resp 15   Ht 4' 10.75" (1.492 m)   Wt 107 lb 9.6 oz (48.8 kg)   LMP  (LMP Unknown)   SpO2 96%   BMI 21.92 kg/m   BP Readings from Last 3 Encounters:  12/14/16 130/78  12/09/16 (!) 152/70  11/25/16 (!) 156/78     Wt Readings from Last 3 Encounters:  12/14/16 107 lb 9.6 oz (48.8 kg)  12/09/16 107 lb (48.5 kg)  11/25/16 107 lb 6.4 oz (48.7 kg)    General appearance: alert, cooperative and appears stated age Ears: normal TM's and external ear canals both ears Throat: lips, mucosa, and tongue normal; teeth and gums normal Neck: no adenopathy, no carotid bruit, supple, symmetrical, trachea midline and thyroid not enlarged, symmetric, no tenderness/mass/nodules Back: symmetric, no curvature. ROM normal. No CVA tenderness. Lungs: clear to auscultation bilaterally Heart: regular rate and rhythm, S1, S2 normal, no murmur, click, rub or gallop Abdomen: soft, non-tender; bowel sounds normal; no masses,  no organomegaly Pulses: 2+ and symmetric Skin: Skin color, texture, turgor normal. No rashes or lesions Lymph nodes: Cervical, supraclavicular, and axillary nodes normal. Neuro: CNs 2-12 intact. DTRs 2+/4 in biceps, brachioradialis, patellars and achilles. Muscle strength 5/5 in upper and lower exremities. Fine resting tremor bilaterally both hands cerebellar function normal. Romberg negative.  No pronator drift.   Gait normal.   Lab Results  Component Value Date   HGBA1C 5.7 07/16/2014    Lab Results  Component Value Date   CREATININE 0.74 11/25/2016   CREATININE 0.78 04/15/2016   CREATININE 0.72 10/16/2015    Lab Results  Component Value Date   WBC 8.3 04/15/2016   HGB 14.9 04/15/2016   HCT 45.0 04/15/2016   PLT 260.0 04/15/2016   GLUCOSE 82 11/25/2016   CHOL 161 04/15/2016   TRIG 56.0 04/15/2016   HDL 79.50 04/15/2016   LDLCALC 71 04/15/2016   ALT 15 11/25/2016   AST 21 11/25/2016   NA 141 11/25/2016   K 4.3 11/25/2016   CL 102 11/25/2016   CREATININE 0.74 11/25/2016   BUN 14 11/25/2016   CO2 33 (H) 11/25/2016   TSH 2.10 11/25/2016   HGBA1C 5.7 07/16/2014   MICROALBUR <0.7 11/25/2016    Mr Lumbar Spine Wo Contrast  Result Date: 12/06/2016 CLINICAL DATA:  Lumbar  radiculopathy right leg pain EXAM: MRI LUMBAR SPINE WITHOUT CONTRAST TECHNIQUE: Multiplanar, multisequence MR imaging of the lumbar spine was performed. No intravenous contrast was administered. COMPARISON:  Lumbar spine radiographs 08/24/2016 FINDINGS: Segmentation:  Partially developed disc space at S1-2. Alignment: Dextroscoliosis. 6 mm anterolisthesis L4-5. Remaining alignment normal Vertebrae:  Negative for fracture or mass Conus medullaris and cauda equina: Conus extends to the L2-3 level. Conus and cauda equina appear normal. Paraspinal and other soft tissues: Negative Disc levels: L1-2: Moderate disc and facet degeneration and spurring. Mild spinal stenosis. Moderate foraminal narrowing bilaterally L2-3: Moderate disc and facet degeneration with spurring. Mild spinal stenosis. Mild foraminal narrowing bilaterally L3-4: Moderate disc degeneration and facet degeneration. Mild spinal stenosis. Left foraminal narrowing due to spurring. Bilateral facet degeneration. L4-5: 6 mm anterolisthesis. Severe facet degeneration. Severe spinal stenosis with severe subarticular stenosis bilaterally. Neural foramina patent L5-S1: Advanced disc degeneration with  disc space narrowing and diffuse endplate spurring. Subarticular stenosis bilaterally. IMPRESSION: Disc and facet degeneration throughout the lumbar spine. Mild spinal stenosis L1-2, L2-3, L3-4 Severe spinal stenosis L4-5 with severe subarticular stenosis bilaterally Subarticular stenosis bilaterally L5-S1 due to spurring. Electronically Signed   By: Franchot Gallo M.D.   On: 12/06/2016 13:05    Assessment & Plan:   Problem List Items Addressed This Visit    Closed fracture of left superior pubic ramus, with routine healing, subsequent encounter    Her pain has resolved. She has resumed her normal activities       Elevated blood-pressure reading without diagnosis of hypertension    Repeat reading today is normal       Lumbar spinal stenosis    Secondary  to degenerative changes.  Patient states that her pain has resolved . No further workup at this time.         A total of 25 minutes of face to face time was spent with patient more than half of which was spent in counselling about the above mentioned conditions  and coordination of care  I am having Malee Luhmann maintain her Multiple Vitamins-Minerals (PRESERVISION AREDS 2 PO), Polyethyl Glycol-Propyl Glycol (SYSTANE OP), Multiple Vitamins-Minerals (CENTRUM SILVER PO), Misc Natural Products (OSTEO BI-FLEX JOINT SHIELD PO), loratadine, fluticasone, MISC NATURAL PRODUCTS PO, latanoprost, levothyroxine, pravastatin, traMADol, and denosumab.  No orders of the defined types were placed in this encounter.   There are no discontinued medications.  Follow-up: Return in about 6 months (around 06/14/2017).   Crecencio Mc, MD

## 2016-12-14 NOTE — Patient Instructions (Addendum)
Your MRI showed that you have lumbar spinal stenosis,  But you do not need to see  A specialist like Dr Loistine Chance unless your back pain becomes persistent or your leg becomes weak. .    I do not think that  the "clunking ' feeling/sound in your head is due to a brain tumor.   However, if you start having headaches,  Dizzy spells or vision changes,  Please let me know and we will obtain an MRI of your brain    Spinal Stenosis Spinal stenosis happens when the open space (spinal canal) between the bones of your spine (vertebrae) gets smaller. It is caused by bone pushing into the open spaces of your backbone (spine). This puts pressure on your backbone and the nerves in your backbone. Treatment often focuses on managing any pain and symptoms. In some cases, surgery may be needed. Follow these instructions at home: Managing pain, stiffness, and swelling  Do all exercises and stretches as told by your doctor.  Stand and sit up straight (use good posture). If you were given a brace or a corset, wear it as told by your doctor.  Do not do any activities that cause pain. Ask your doctor what activities are safe for you.  Do not lift anything that is heavier than 10 lb (4.5 kg) or heavier than your doctor tells you.  Try to stay at a healthy weight. Talk with your doctor if you need help losing weight.  If directed, put heat on the affected area as often as told by your doctor. Use the heat source that your doctor recommends, such as a moist heat pack or a heating pad. ? Put a towel between your skin and the heat source. ? Leave the heat on for 20-30 minutes. ? Remove the heat if your skin turns bright red. This is especially important if you are not able to feel pain, heat, or cold. You may have a greater risk of getting burned. General instructions  Take over-the-counter and prescription medicines only as told by your doctor.  Do not use any products that contain nicotine or tobacco, such as  cigarettes and e-cigarettes. If you need help quitting, ask your doctor.  Eat a healthy diet. This includes plenty of fruits and vegetables, whole grains, and low-fat (lean) protein.  Keep all follow-up visits as told by your doctor. This is important. Contact a doctor if:  Your symptoms do not get better.  Your symptoms get worse.  You have a fever. Get help right away if:  You have new or worse pain in your neck or upper back.  You have very bad pain that medicine does not control.  You are dizzy.  You have vision problems, blurred vision, or double vision.  You have a very bad headache that is worse when you stand.  You feel sick to your stomach (nauseous).  You throw up (vomit).  You have new or worse numbness or tingling in your back or legs.  You have pain, redness, swelling, or warmth in your arm or leg. Summary  Spinal stenosis happens when the open space (spinal canal) between the bones of your spine gets smaller (narrow).  Contact a doctor if your symptoms get worse.  In some cases, surgery may be needed. This information is not intended to replace advice given to you by your health care provider. Make sure you discuss any questions you have with your health care provider. Document Released: 04/23/2010 Document Revised: 12/03/2015 Document Reviewed:  12/03/2015 Elsevier Interactive Patient Education  2017 [employer].

## 2016-12-15 DIAGNOSIS — M48061 Spinal stenosis, lumbar region without neurogenic claudication: Secondary | ICD-10-CM | POA: Insufficient documentation

## 2016-12-15 NOTE — Assessment & Plan Note (Signed)
Repeat reading today is normal

## 2016-12-15 NOTE — Assessment & Plan Note (Signed)
Her pain has resolved. She has resumed her normal activities

## 2016-12-15 NOTE — Assessment & Plan Note (Signed)
Secondary to degenerative changes.  Patient states that her pain has resolved . No further workup at this time.

## 2017-01-14 NOTE — Progress Notes (Signed)
[employer] Pharmacist, community for [employer] today through [place] portal.

## 2017-01-27 ENCOUNTER — Ambulatory Visit (INDEPENDENT_AMBULATORY_CARE_PROVIDER_SITE_OTHER): Payer: PPO | Admitting: Family Medicine

## 2017-01-27 ENCOUNTER — Encounter: Payer: Self-pay | Admitting: Family Medicine

## 2017-01-27 ENCOUNTER — Telehealth: Payer: Self-pay | Admitting: Internal Medicine

## 2017-01-27 ENCOUNTER — Ambulatory Visit: Payer: Self-pay | Admitting: *Deleted

## 2017-01-27 VITALS — BP 128/72 | HR 63 | Temp 98.4°F | Ht <= 58 in | Wt 108.0 lb

## 2017-01-27 DIAGNOSIS — G44209 Tension-type headache, unspecified, not intractable: Secondary | ICD-10-CM | POA: Insufficient documentation

## 2017-01-27 NOTE — Progress Notes (Signed)
Gloria Carr - 84 y.o. female MRN [id number]  Date of birth: 1927-08-01  SUBJECTIVE:  Including CC & ROS.  Chief Complaint  Patient presents with  . Headache    Gloria Carr is a 84 y.o. female that is presenting with headache. This has been ongoing intermittently for three weeks. Headache is primarily located near the back of her head. Denies light sensitivity. Denies head injuries. She has taken aleve with some improvement. Denies certain movements that trigger her headache. She has been sewing with her head bent forward. Denies any changes in vision.      Review of Systems  Constitutional: Negative for fever.  Respiratory: Negative for cough.   Cardiovascular: Negative for chest pain.  Gastrointestinal: Negative for abdominal pain.  Musculoskeletal: Positive for neck pain. Negative for back pain.  Skin: Negative for color change.  Neurological: Positive for headaches.  Hematological: Negative for adenopathy.  Psychiatric/Behavioral: Negative for agitation.    HISTORY: Past Medical, Surgical, Social, and Family History Reviewed & Updated per EMR.   Pertinent Historical Findings include:  Past Medical History:  Diagnosis Date  . Allergy   . Arthritis   . Glaucoma   . History of chickenpox   . History of recurrent UTIs   . Thyroid disease     Past Surgical History:  Procedure Laterality Date  . BILATERAL CARPAL TUNNEL RELEASE  1999-2000  . CATARACT EXTRACTION, BILATERAL  2013    Allergies  Allergen Reactions  . Sulfa Antibiotics Other (See Comments)    Childhood reaction     Family History  Problem Relation Age of Onset  . Stroke Mother   . Cancer Mother        colon  . Cancer Father        bone  . Cancer Son        Jaw     Social History   Socioeconomic History  . Marital status: Widowed    Spouse name: Not on file  . Number of children: Not on file  . Years of education: Not on file  . Highest education level: Not on file  Social Needs  .  Financial resource strain: Not on file  . Food insecurity - worry: Not on file  . Food insecurity - inability: Not on file  . Transportation needs - medical: No  . Transportation needs - non-medical: No  Occupational History  . Not on file  Tobacco Use  . Smoking status: Former Research scientist (life sciences)  . Smokeless tobacco: Never Used  Substance and Sexual Activity  . Alcohol use: Yes    Alcohol/week: 3.0 oz    Types: 5 Standard drinks or equivalent per week    Comment: with dinner  . Drug use: No  . Sexual activity: No  Other Topics Concern  . Not on file  Social History Narrative  . Not on file     PHYSICAL EXAM:  VS: BP 128/72 (BP Location: Left Arm, Patient Position: Sitting, Cuff Size: Normal)   Pulse 63   Temp 98.4 F (36.9 C) (Oral)   Ht 4\' 10"  (1.473 m)   Wt 108 lb (49 kg)   LMP  (LMP Unknown)   SpO2 97%   BMI 22.57 kg/m  Physical Exam Gen: NAD, alert, cooperative with exam, well-appearing ENT: normal lips, normal nasal mucosa,  Eye: normal EOM, normal conjunctiva and lids CV:  no edema, +2 pedal pulses   Resp: no accessory muscle use, non-labored,  Skin: no rashes, no areas of induration  Neuro:  normal tone, normal sensation to touch, cranial nerves II through XII intact Psych:  normal insight, alert and oriented MSK:  Neck: No tenderness to palpation over the cervical midline spine. No tenderness to palpation over the thoracic midline spine. No significant tenderness to the paraspinal cervical muscles. No tenderness palpation of the trapezius muscles. Limited lateral rotation with right worse than left. Limited side bending with right worse than left. Normal structure strength resistance. Normal shoulder range of motion. No tenderness to palpation over the temporal regions. Normal grip strength. Normal strength in extremities bilaterally. Neurovascularly intact     ASSESSMENT & PLAN:   Acute non intractable tension-type headache Appears to be related to her  head being bent forward when she is sewing. Tension type in nature. She looks well on exam with on pain today.  - counseled on HEP  - Can try Aspercreme or capsaicin cream - if no improvement could try PT

## 2017-01-27 NOTE — Assessment & Plan Note (Addendum)
Appears to be related to her head being bent forward when she is sewing. Tension type in nature. She looks well on exam with on pain today.  - counseled on HEP  - Can try Aspercreme or capsaicin cream - if no improvement could try PT

## 2017-01-27 NOTE — Telephone Encounter (Signed)
Left message for patient to return call back.  With pain being that elevated she would need an appointment. Dr. Derrel Nip schedule does not have anything availble at the moment, but she can see other providers.  [name] may obtain further information and give information.

## 2017-01-27 NOTE — Telephone Encounter (Signed)
Pt called complaining of headaches; "Patient is letting doctor know she is having headaches (pain is 7/10) at night. 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, and she also feels  knots in back of head."  see CRM # [id number]; she says that she had a MRI on 12/06/16 and was diagnosed with spinal stenosis but she and Dr Derrel Nip decided not to treat this; the pt further states that Dr Derrel Nip instructed her to call if she started having headaches; the pt also states that she takes aleve but not everyday; per nurse triage recommendation made for pt to see physician within 3 days; pt states that she would like to see Dr Derrel Nip but no appointments available in [facility] within the pt's time constraints and she states that she can not drive to another [facility] location because she has glaucoma and maculardegeneration; conference call initiated with pt's son Tawnie Ehresman and he agreed to bring pt to another location; pt is offered appointment with Clearance Coots at [facility] at 1600 today; they pt and her son verbalize understanding.  Reason for Disposition . [1] MILD-MODERATE headache AND [2] present > 72 hours  Answer Assessment - Initial Assessment Questions 1. LOCATION: "Where does it hurt?"      The left side back of her head 2. ONSET: "When did the headache start?" (Minutes, hours or days)     Around 12/06/16 3. PATTERN: "Does the pain come and go, or has it been constant since it started?"     Comes and goes; mostly in evening wen doing needle work 4. SEVERITY: "How bad is the pain?" and "What does it keep you from doing?"  (e.g., Scale 1-10; mild, moderate, or severe)   - MILD (1-3): doesn't interfere with normal activities    - MODERATE (4-7): interferes with normal activities or awakens from sleep    - SEVERE (8-10): excruciating pain, unable to do any normal activities        moderate 5. RECURRENT SYMPTOM: "Have you ever had headaches before?" If so, ask: "When was the last time?" and "What  happened that time?"      no 6. CAUSE: "What do you think is causing the headache?"     unsure 7. MIGRAINE: "Have you been diagnosed with migraine headaches?" If so, ask: "Is this headache similar?"      no 8. HEAD INJURY: "Has there been any recent injury to the head?"      no 9. OTHER SYMPTOMS: "Do you have any other symptoms?" (fever, stiff neck, eye pain, sore throat, cold symptoms)     Knots in the head and hearing "clunking sound" 10. PREGNANCY: "Is there any chance you are pregnant?" "When was your last menstrual period?"       no  Protocols used: HEADACHE-A-AH

## 2017-01-27 NOTE — Telephone Encounter (Signed)
Copied from [place]. Topic: Quick Communication - See Telephone Encounter >> Jan 27, 2017 10:03 AM Boyd Kerbs wrote: CRM for notification. See Telephone encounter for:   Patient is letting doctor know she is having headaches (pain is 7/10) at night. 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   She is asking if doctor would like to see her?   Please call patient and let her know.   01/27/17.

## 2017-01-27 NOTE — Patient Instructions (Signed)
  Please try Aspercreme with lidocaine Take tylenol 650 mg three times a day is the best evidence based medicine we have for arthritis.   Glucosamine sulfate 750mg  twice a day is a supplement that has been shown to help moderate to severe arthritis.  Vitamin D 2000 IU daily  Fish oil 2 grams daily.   Tumeric 500mg  twice daily.   Capsaicin topically up to four times a day may also help with pain.  It's important that you continue to stay active.

## 2017-01-28 DIAGNOSIS — H353132 Nonexudative age-related macular degeneration, bilateral, intermediate dry stage: Secondary | ICD-10-CM | POA: Diagnosis not present

## 2017-02-04 ENCOUNTER — Telehealth: Payer: Self-pay

## 2017-02-04 NOTE — Telephone Encounter (Signed)
Copied from [place]. Topic: General - Call Back - No Documentation >> Feb 04, 2017 10:13 AM Gloria Carr wrote: Reason for CRM: Patient missed a call possibly about Prolia injection and would like to know if her insurance would cover? Please advise.

## 2017-02-05 ENCOUNTER — Other Ambulatory Visit: Payer: Self-pay | Admitting: Internal Medicine

## 2017-02-11 NOTE — Telephone Encounter (Signed)
Tried to call patient to set up Prolia injection no answer left message to call office need nurse visit please notify if appointment made.

## 2017-02-16 ENCOUNTER — Ambulatory Visit (INDEPENDENT_AMBULATORY_CARE_PROVIDER_SITE_OTHER): Payer: PPO | Admitting: *Deleted

## 2017-02-16 DIAGNOSIS — M810 Age-related osteoporosis without current pathological fracture: Secondary | ICD-10-CM | POA: Diagnosis not present

## 2017-02-16 MED ORDER — DENOSUMAB 60 MG/ML ~~LOC~~ SOLN
60.0000 mg | Freq: Once | SUBCUTANEOUS | Status: AC
  Administered 2017-02-16: 60 mg via SUBCUTANEOUS

## 2017-02-16 NOTE — Progress Notes (Signed)
Patient presented for Prolia injection to left arm , patient voiced no concerns or complaints during or after injection. 

## 2017-02-17 NOTE — Telephone Encounter (Signed)
appointment made

## 2017-02-27 NOTE — Progress Notes (Signed)
  I have reviewed the above information and agree with above.   Saisha Hogue, MD 

## 2017-04-05 ENCOUNTER — Other Ambulatory Visit: Payer: Self-pay | Admitting: Internal Medicine

## 2017-04-05 DIAGNOSIS — Z1231 Encounter for screening mammogram for malignant neoplasm of breast: Secondary | ICD-10-CM

## 2017-04-09 ENCOUNTER — Other Ambulatory Visit: Payer: Self-pay | Admitting: Internal Medicine

## 2017-04-13 ENCOUNTER — Encounter: Payer: Self-pay | Admitting: Internal Medicine

## 2017-04-13 ENCOUNTER — Ambulatory Visit (INDEPENDENT_AMBULATORY_CARE_PROVIDER_SITE_OTHER): Payer: PPO

## 2017-04-13 ENCOUNTER — Ambulatory Visit (INDEPENDENT_AMBULATORY_CARE_PROVIDER_SITE_OTHER): Payer: PPO | Admitting: Internal Medicine

## 2017-04-13 VITALS — BP 160/82 | HR 68 | Temp 97.9°F | Resp 18 | Wt 106.0 lb

## 2017-04-13 DIAGNOSIS — R350 Frequency of micturition: Secondary | ICD-10-CM | POA: Diagnosis not present

## 2017-04-13 DIAGNOSIS — M7918 Myalgia, other site: Secondary | ICD-10-CM

## 2017-04-13 DIAGNOSIS — R197 Diarrhea, unspecified: Secondary | ICD-10-CM

## 2017-04-13 DIAGNOSIS — I10 Essential (primary) hypertension: Secondary | ICD-10-CM | POA: Diagnosis not present

## 2017-04-13 DIAGNOSIS — S79911A Unspecified injury of right hip, initial encounter: Secondary | ICD-10-CM | POA: Diagnosis not present

## 2017-04-13 DIAGNOSIS — M25551 Pain in right hip: Secondary | ICD-10-CM

## 2017-04-13 DIAGNOSIS — E039 Hypothyroidism, unspecified: Secondary | ICD-10-CM | POA: Diagnosis not present

## 2017-04-13 DIAGNOSIS — E559 Vitamin D deficiency, unspecified: Secondary | ICD-10-CM | POA: Diagnosis not present

## 2017-04-13 DIAGNOSIS — E7800 Pure hypercholesterolemia, unspecified: Secondary | ICD-10-CM | POA: Diagnosis not present

## 2017-04-13 LAB — POCT URINALYSIS DIPSTICK
BILIRUBIN UA: NEGATIVE
GLUCOSE UA: NEGATIVE
Ketones, UA: NEGATIVE
Nitrite, UA: NEGATIVE
PH UA: 6 (ref 5.0–8.0)
Protein, UA: NEGATIVE
RBC UA: NEGATIVE
Spec Grav, UA: 1.025 (ref 1.010–1.025)
UROBILINOGEN UA: 0.2 U/dL

## 2017-04-13 LAB — CBC WITH DIFFERENTIAL/PLATELET
BASOS ABS: 0 10*3/uL (ref 0.0–0.1)
Basophils Relative: 0.7 % (ref 0.0–3.0)
EOS ABS: 0.1 10*3/uL (ref 0.0–0.7)
Eosinophils Relative: 1.4 % (ref 0.0–5.0)
HEMATOCRIT: 45.1 % (ref 36.0–46.0)
HEMOGLOBIN: 15 g/dL (ref 12.0–15.0)
Lymphocytes Relative: 30.7 % (ref 12.0–46.0)
Lymphs Abs: 1.9 10*3/uL (ref 0.7–4.0)
MCHC: 33.3 g/dL (ref 30.0–36.0)
MCV: 91.9 fl (ref 78.0–100.0)
MONOS PCT: 9.9 % (ref 3.0–12.0)
Monocytes Absolute: 0.6 10*3/uL (ref 0.1–1.0)
NEUTROS ABS: 3.6 10*3/uL (ref 1.4–7.7)
Neutrophils Relative %: 57.3 % (ref 43.0–77.0)
Platelets: 269 10*3/uL (ref 150.0–400.0)
RBC: 4.9 Mil/uL (ref 3.87–5.11)
RDW: 13 % (ref 11.5–15.5)
WBC: 6.3 10*3/uL (ref 4.0–10.5)

## 2017-04-13 LAB — SEDIMENTATION RATE: Sed Rate: 17 mm/hr (ref 0–30)

## 2017-04-13 LAB — VITAMIN D 25 HYDROXY (VIT D DEFICIENCY, FRACTURES): VITD: 24.05 ng/mL — AB (ref 30.00–100.00)

## 2017-04-13 MED ORDER — AMLODIPINE BESYLATE 2.5 MG PO TABS: 2.5000 mg | ORAL_TABLET | Freq: Every day | ORAL | 3 refills | Status: DC

## 2017-04-13 NOTE — Progress Notes (Signed)
poct

## 2017-04-13 NOTE — Progress Notes (Signed)
Subjective:  Patient ID: Gloria Carr, female    DOB: 1927-02-22  Age: 84 y.o. MRN: [id number]  CC: The primary encounter diagnosis was Frequent urination. Diagnoses of Right buttock pain, Diarrhea, unspecified type, Vitamin D deficiency, Diarrhea in adult patient, Essential hypertension, Pure hypercholesterolemia, Acquired hypothyroidism, and Pain of right hip joint pain were also pertinent to this visit.  HPI Gloria Carr presents for multiple complaints  Loose stools  Several daily  For the past several  Weeks . Thinks it may have started  around the time she started drinking whole cow's milk to gain weight.  Her first stool of the day is formed and normal. She has several stools that follow throughout the morning that become progressively softer and are accompanied by urgency and mild cramping.    Has improved since she switched from whole mile to 2% but not resolved..   She had another fall 3 days ago,  Occurred while in kitchen;   she tripped on her shoe and fell onto her bottom. Taking balance class twice per week,  Chair aerobics  And line dancing.  (previous fall was in June and she had a pubic ramus fractre  Bottom is sore and she feels pain in left iliac with walking .  Had 2 sessions with PT after last fall , was told by [facility] to walk frequently .  Osteo[porosisi : first Prolia injection was given in February 2019.   Chronic Urinary frequency,.    Previously noted Sciatica has resolved.   bp elevated .Marland Kitchen Last dose of motirn or aleve was 3 days ago   Outpatient Medications Prior to Visit  Medication Sig Dispense Refill  . fluticasone (FLONASE) 50 MCG/ACT nasal spray Place into both nostrils daily. Reported on 01/17/2015    . latanoprost (XALATAN) 0.005 % ophthalmic solution INSTILL 1 DROP IN THE LEFT EYE AT NIGHT  5  . levothyroxine (SYNTHROID, LEVOTHROID) 50 MCG tablet TAKE 1 TABLET BY MOUTH DAILY 90 tablet 2  . Misc Natural Products (OSTEO BI-FLEX JOINT SHIELD PO) Take by  mouth.    Marland Kitchen MISC NATURAL PRODUCTS PO Take by mouth. Peripheral nerve health    . Multiple Vitamins-Minerals (CENTRUM SILVER PO) Take by mouth.    . Multiple Vitamins-Minerals (PRESERVISION AREDS 2 PO) Take by mouth.    Vladimir Faster Glycol-Propyl Glycol (SYSTANE OP) Apply to eye.    . pravastatin (PRAVACHOL) 20 MG tablet TAKE 1 TABLET(20 MG) BY MOUTH DAILY 90 tablet 1  . loratadine (CLARITIN) 10 MG tablet Take 10 mg by mouth daily. Reported on 01/17/2015    . traMADol (ULTRAM) 50 MG tablet Take 1 tablet (50 mg total) 2 (two) times daily by mouth. As needed for moderate pain due to sciatica (Patient not taking: Reported on 04/13/2017) 60 tablet 2   No facility-administered medications prior to visit.     Review of Systems;  Patient denies headache, fevers, malaise, unintentional weight loss, skin rash, eye pain, sinus congestion and sinus pain, sore throat, dysphagia,  hemoptysis , cough, dyspnea, wheezing, chest pain, palpitations, orthopnea, edema, abdominal pain, nausea, melena, diarrhea, constipation, flank pain, dysuria, hematuria, urinary  Frequency, nocturia, numbness, tingling, seizures,  Focal weakness, Loss of consciousness,  Tremor, insomnia, depression, anxiety, and suicidal ideation.      Objective:  BP (!) 160/82 (BP Location: Left Arm, Patient Position: Sitting, Cuff Size: Normal)   Pulse 68   Temp 97.9 F (36.6 C) (Oral)   Resp 18   Wt 106 lb (48.1 kg)  LMP  (LMP Unknown)   SpO2 97%   BMI 22.15 kg/m   BP Readings from Last 3 Encounters:  04/13/17 (!) 160/82  01/27/17 128/72  12/14/16 130/78    Wt Readings from Last 3 Encounters:  04/13/17 106 lb (48.1 kg)  01/27/17 108 lb (49 kg)  12/14/16 107 lb 9.6 oz (48.8 kg)    General appearance: alert, cooperative and appears stated age Ears: normal TM's and external ear canals both ears Throat: lips, mucosa, and tongue normal; teeth and gums normal Neck: no adenopathy, no carotid bruit, supple, symmetrical, trachea  midline and thyroid not enlarged, symmetric, no tenderness/mass/nodules Back: symmetric, no curvature. ROM normal. No CVA tenderness. Lungs: clear to auscultation bilaterally Heart: regular rate and rhythm, S1, S2 normal, no murmur, click, rub or gallop Abdomen: soft, non-tender; bowel sounds normal; no masses,  no organomegaly Pulses: 2+ and symmetric Skin: Skin color, texture, turgor normal. No rashes or lesions Lymph nodes: Cervical, supraclavicular, and axillary nodes normal.  Lab Results  Component Value Date   HGBA1C 5.7 07/16/2014    Lab Results  Component Value Date   CREATININE 0.74 11/25/2016   CREATININE 0.78 04/15/2016   CREATININE 0.72 10/16/2015    Lab Results  Component Value Date   WBC 6.3 04/13/2017   HGB 15.0 04/13/2017   HCT 45.1 04/13/2017   PLT 269.0 04/13/2017   GLUCOSE 82 11/25/2016   CHOL 161 04/15/2016   TRIG 56.0 04/15/2016   HDL 79.50 04/15/2016   LDLCALC 71 04/15/2016   ALT 15 11/25/2016   AST 21 11/25/2016   NA 141 11/25/2016   K 4.3 11/25/2016   CL 102 11/25/2016   CREATININE 0.74 11/25/2016   BUN 14 11/25/2016   CO2 33 (H) 11/25/2016   TSH 2.10 11/25/2016   HGBA1C 5.7 07/16/2014   MICROALBUR <0.7 11/25/2016    Mr Lumbar Spine Wo Contrast  Result Date: 12/06/2016 CLINICAL DATA:  Lumbar radiculopathy right leg pain EXAM: MRI LUMBAR SPINE WITHOUT CONTRAST TECHNIQUE: Multiplanar, multisequence MR imaging of the lumbar spine was performed. No intravenous contrast was administered. COMPARISON:  Lumbar spine radiographs 08/24/2016 FINDINGS: Segmentation:  Partially developed disc space at S1-2. Alignment: Dextroscoliosis. 6 mm anterolisthesis L4-5. Remaining alignment normal Vertebrae:  Negative for fracture or mass Conus medullaris and cauda equina: Conus extends to the L2-3 level. Conus and cauda equina appear normal. Paraspinal and other soft tissues: Negative Disc levels: L1-2: Moderate disc and facet degeneration and spurring. Mild spinal  stenosis. Moderate foraminal narrowing bilaterally L2-3: Moderate disc and facet degeneration with spurring. Mild spinal stenosis. Mild foraminal narrowing bilaterally L3-4: Moderate disc degeneration and facet degeneration. Mild spinal stenosis. Left foraminal narrowing due to spurring. Bilateral facet degeneration. L4-5: 6 mm anterolisthesis. Severe facet degeneration. Severe spinal stenosis with severe subarticular stenosis bilaterally. Neural foramina patent L5-S1: Advanced disc degeneration with disc space narrowing and diffuse endplate spurring. Subarticular stenosis bilaterally. IMPRESSION: Disc and facet degeneration throughout the lumbar spine. Mild spinal stenosis L1-2, L2-3, L3-4 Severe spinal stenosis L4-5 with severe subarticular stenosis bilaterally Subarticular stenosis bilaterally L5-S1 due to spurring. Electronically Signed   By: Franchot Gallo M.D.   On: 12/06/2016 13:05    Assessment & Plan:   Problem List Items Addressed This Visit    Vitamin D deficiency   Relevant Orders   VITAMIN D 25 Hydroxy (Vit-D Deficiency, Fractures) (Completed)   Pure hypercholesterolemia    LDL and triglycerides are at goal on current medications. She has no side effects and liver enzymes are  normal. No changes today  Lab Results  Component Value Date   CHOL 161 04/15/2016   HDL 79.50 04/15/2016   LDLCALC 71 04/15/2016   TRIG 56.0 04/15/2016   CHOLHDL 2 04/15/2016   Lab Results  Component Value Date   ALT 15 11/25/2016   AST 21 11/25/2016   ALKPHOS 74 11/25/2016   BILITOT 0.6 11/25/2016           Relevant Medications   amLODipine (NORVASC) 2.5 MG tablet   Pain of right hip joint pain    Secondary to fall.  Plain films  Done today show old fractures of pubic ramus       Essential hypertension    Advised to start amlodipine       Relevant Medications   amLODipine (NORVASC) 2.5 MG tablet   Diarrhea in adult patient    Noninfectious.  Appears to be related to food intolerance .   Trial of lactase  If no improvement,  Refer to GI for colonoscopy      Acquired hypothyroidism    Thyroid function is WNL on current dose.  No current changes needed.   Lab Results  Component Value Date   TSH 2.10 11/25/2016          Other Visit Diagnoses    Frequent urination    -  Primary   Relevant Orders   POCT Urinalysis Dipstick (Completed)   Urine Culture (Completed)   Right buttock pain       Relevant Orders   DG HIP UNILAT WITH PELVIS 2-3 VIEWS RIGHT (Completed)   Diarrhea, unspecified type       Relevant Orders   CBC with Differential/Platelet (Completed)   Sedimentation rate (Completed)   Fecal occult blood, imunochemical     A total of 25 minutes of face to face time was spent with patient more than half of which was spent in counselling about the above mentioned conditions  and coordination of care   I am having Gloria Carr start on amLODipine. I am also having her maintain her Multiple Vitamins-Minerals (PRESERVISION AREDS 2 PO), Polyethyl Glycol-Propyl Glycol (SYSTANE OP), Multiple Vitamins-Minerals (CENTRUM SILVER PO), Misc Natural Products (OSTEO BI-FLEX JOINT SHIELD PO), loratadine, fluticasone, MISC NATURAL PRODUCTS PO, latanoprost, traMADol, pravastatin, and levothyroxine.  Meds ordered this encounter  Medications  . amLODipine (NORVASC) 2.5 MG tablet    Sig: Take 1 tablet (2.5 mg total) by mouth daily. At night for blood pressure    Dispense:  90 tablet    Refill:  3    There are no discontinued medications.  Follow-up: Return in about 6 months (around 10/13/2017) for hypertension .   Crecencio Mc, MD

## 2017-04-13 NOTE — Patient Instructions (Addendum)
Regarding your loose stools:  I recommend that you try using Lactase before any meal containing dairy.   Your Lactaid milk does not require use of Lactase,  But you should try it  before cheese,  Ice cream,  And cottage cheese/yogurt .  If this does not help,  I will refer you to a GI doctor     your blood pressure  Is elevated  Again today .  I am recommending that you start a mild blood pressure medication called amlodipine  At bedtime.    Please make a nurse visit in 1-2 weeks for a repeat bp check.  You can bring your home monitor for comparison so that we can use your home readings to guide therapy   I have ordered  x rays today to rule out a fracture on your right side

## 2017-04-14 DIAGNOSIS — M25551 Pain in right hip: Secondary | ICD-10-CM | POA: Insufficient documentation

## 2017-04-14 DIAGNOSIS — R197 Diarrhea, unspecified: Secondary | ICD-10-CM | POA: Insufficient documentation

## 2017-04-14 DIAGNOSIS — M545 Low back pain, unspecified: Secondary | ICD-10-CM | POA: Insufficient documentation

## 2017-04-14 LAB — URINE CULTURE
MICRO NUMBER:: 90412136
SPECIMEN QUALITY: ADEQUATE

## 2017-04-14 NOTE — Assessment & Plan Note (Addendum)
Secondary to fall.  Plain films  Done today show old fractures of pubic ramus

## 2017-04-14 NOTE — Assessment & Plan Note (Signed)
LDL and triglycerides are at goal on current medications. She has no side effects and liver enzymes are normal. No changes today  Lab Results  Component Value Date   CHOL 161 04/15/2016   HDL 79.50 04/15/2016   LDLCALC 71 04/15/2016   TRIG 56.0 04/15/2016   CHOLHDL 2 04/15/2016   Lab Results  Component Value Date   ALT 15 11/25/2016   AST 21 11/25/2016   ALKPHOS 74 11/25/2016   BILITOT 0.6 11/25/2016

## 2017-04-14 NOTE — Assessment & Plan Note (Signed)
Thyroid function is WNL on current dose.  No current changes needed.   Lab Results  Component Value Date   TSH 2.10 11/25/2016

## 2017-04-14 NOTE — Assessment & Plan Note (Signed)
Advised to start amlodipine

## 2017-04-14 NOTE — Assessment & Plan Note (Signed)
Noninfectious.  Appears to be related to food intolerance .  Trial of lactase  If no improvement,  Refer to GI for colonoscopy

## 2017-04-27 ENCOUNTER — Ambulatory Visit
Admission: RE | Admit: 2017-04-27 | Discharge: 2017-04-27 | Disposition: A | Discharge status: Home / Self Care | Payer: PPO | Source: Ambulatory Visit | Attending: Internal Medicine | Admitting: Internal Medicine

## 2017-04-27 DIAGNOSIS — Z1231 Encounter for screening mammogram for malignant neoplasm of breast: Secondary | ICD-10-CM | POA: Insufficient documentation

## 2017-05-10 ENCOUNTER — Telehealth: Payer: Self-pay | Admitting: Internal Medicine

## 2017-05-10 NOTE — Telephone Encounter (Signed)
Please advise 

## 2017-05-10 NOTE — Telephone Encounter (Signed)
Copied from [place]. Topic: Quick Communication - See Telephone Encounter >> May 10, 2017  1:25 PM Robina Ade, Helene Kelp D wrote: CRM for notification. See Telephone encounter for: 05/10/17. Patient called and would like to talk to Dr. Derrel Nip or her CMA about getting a bursitis injection for pain. Please call patient back, thanks.

## 2017-05-11 NOTE — Telephone Encounter (Signed)
She will need a referral,  And I will need an office  note from the chiropractor before I can make the referral

## 2017-05-11 NOTE — Telephone Encounter (Signed)
Pt stated that she fell recently and she was xrayed. 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 She was advised that she would benefit from a cortisone injection. Should we schedule pt with you or does she need a referral?

## 2017-05-13 ENCOUNTER — Telehealth: Payer: Self-pay

## 2017-05-13 DIAGNOSIS — M4716 Other spondylosis with myelopathy, lumbar region: Secondary | ICD-10-CM | POA: Diagnosis not present

## 2017-05-13 NOTE — Telephone Encounter (Signed)
Spoke with pt and she stated that she is just going to [facility] as a walk in.

## 2017-05-13 NOTE — Telephone Encounter (Signed)
Copied from [place]. Topic: Quick Communication - See Telephone Encounter >> May 10, 2017  1:25 PM Robina Ade, Helene Kelp D wrote: CRM for notification. See Telephone encounter for: 05/10/17. Patient called and would like to talk to Dr. Derrel Nip or her CMA about getting a bursitis injection for pain. Please call patient back, thanks. >> May 13, 2017 10:20 AM Neva Seat wrote: Pt called back and still waiting on her call back from 05-10-17. Pt is in pain and still needing an injection for her pain. Please give pt a call back asap.

## 2017-05-13 NOTE — Telephone Encounter (Signed)
See previous message

## 2017-06-01 DIAGNOSIS — M545 Low back pain: Secondary | ICD-10-CM | POA: Diagnosis not present

## 2017-06-01 DIAGNOSIS — M4716 Other spondylosis with myelopathy, lumbar region: Secondary | ICD-10-CM | POA: Diagnosis not present

## 2017-06-01 DIAGNOSIS — M6281 Muscle weakness (generalized): Secondary | ICD-10-CM | POA: Diagnosis not present

## 2017-06-03 ENCOUNTER — Encounter: Payer: Self-pay | Admitting: Internal Medicine

## 2017-06-03 ENCOUNTER — Ambulatory Visit (INDEPENDENT_AMBULATORY_CARE_PROVIDER_SITE_OTHER): Payer: PPO | Admitting: Internal Medicine

## 2017-06-03 ENCOUNTER — Ambulatory Visit: Payer: PPO | Admitting: Internal Medicine

## 2017-06-03 VITALS — BP 146/58 | HR 78 | Temp 97.8°F | Resp 16 | Ht <= 58 in | Wt 105.8 lb

## 2017-06-03 DIAGNOSIS — E559 Vitamin D deficiency, unspecified: Secondary | ICD-10-CM | POA: Diagnosis not present

## 2017-06-03 DIAGNOSIS — I10 Essential (primary) hypertension: Secondary | ICD-10-CM | POA: Diagnosis not present

## 2017-06-03 DIAGNOSIS — E039 Hypothyroidism, unspecified: Secondary | ICD-10-CM | POA: Diagnosis not present

## 2017-06-03 DIAGNOSIS — M48061 Spinal stenosis, lumbar region without neurogenic claudication: Secondary | ICD-10-CM

## 2017-06-03 DIAGNOSIS — M25551 Pain in right hip: Secondary | ICD-10-CM

## 2017-06-03 DIAGNOSIS — Z79899 Other long term (current) drug therapy: Secondary | ICD-10-CM

## 2017-06-03 DIAGNOSIS — R197 Diarrhea, unspecified: Secondary | ICD-10-CM | POA: Diagnosis not present

## 2017-06-03 LAB — COMPREHENSIVE METABOLIC PANEL
ALBUMIN: 3.8 g/dL (ref 3.5–5.2)
ALT: 16 U/L (ref 0–35)
AST: 17 U/L (ref 0–37)
Alkaline Phosphatase: 43 U/L (ref 39–117)
BILIRUBIN TOTAL: 0.7 mg/dL (ref 0.2–1.2)
BUN: 14 mg/dL (ref 6–23)
CO2: 30 mEq/L (ref 19–32)
CREATININE: 0.74 mg/dL (ref 0.40–1.20)
Calcium: 9.4 mg/dL (ref 8.4–10.5)
Chloride: 104 mEq/L (ref 96–112)
GFR: 78.7 mL/min (ref >60.00)
Glucose, Bld: 115 mg/dL — ABNORMAL HIGH (ref 70–99)
Potassium: 4.2 mEq/L (ref 3.5–5.1)
SODIUM: 140 meq/L (ref 135–145)
TOTAL PROTEIN: 6.4 g/dL (ref 6.0–8.3)

## 2017-06-03 LAB — VITAMIN D 25 HYDROXY (VIT D DEFICIENCY, FRACTURES): VITD: 27.07 ng/mL — AB (ref 30.00–100.00)

## 2017-06-03 LAB — TSH: TSH: 1.41 u[IU]/mL (ref 0.35–4.50)

## 2017-06-03 MED ORDER — TRAMADOL HCL 50 MG PO TABS: 50.0000 mg | ORAL_TABLET | Freq: Two times a day (BID) | ORAL | 5 refills | Status: DC | PRN

## 2017-06-03 NOTE — Patient Instructions (Addendum)
Your home blood pressure readings are fine.   Continue your current medications  I recommend a trial of Miralax or citrucel  powder in 8 ounces of water daily  To see if we can correct your frequent stools   Try using a lanolin or aloe type flushable wipe instead of the paper toilet paper.     I will refill the tramadol to use if you have moderate to severe pain .  Your orthopedist may be correct : since you have had pelvic fractures in the past ,  You may be dealing with pain from arthritis

## 2017-06-03 NOTE — Progress Notes (Signed)
Subjective:  Patient ID: Gloria Carr, female    DOB: May 15, 1927  Age: 84 y.o. MRN: [id number]  CC: The primary encounter diagnosis was Vitamin D deficiency. Diagnoses of Acquired hypothyroidism, Long-term use of high-risk medication, Diarrhea in adult patient, Essential hypertension, Spinal stenosis of lumbar region without neurogenic claudication, and Pain of right hip joint pain were also pertinent to this visit.  HPI Gloria Carr presents for follow up on multiple issues  1) loose stools:  Has been following a lactose free diet.  No significant change and the dit is more expensive. Her stool pattern is unchanged: the first stool daily is hard, small caliber stools, followed by several more stools that become progressively more loose. She develops perirectal irritation from wiping multiple  times     2) sciatic pain: secondary to spondylosis,  referred by chiropractor to [facility] .  X rays showed severe arthritic changes.  Received prednisone taper,.  Did not change.   is now getting PT  First session was yesterday.   Still having pain in the right buttock  And radiates to right knee. Not bothering her at night unless she wakes up to void. aggravated by sitting for long periods of time.  Tylenol did not help so she has resumed prn use of tramadol.  She has osteoporosis: prior fractures seen in the pubic rami   3) Hypertension:  Patient is taking her medications as prescribed and notes no adverse effects.  Home BP readings have been done about once per week and are  generally < 130/80 .  She is avoiding added salt in her diet and walking regularly about 3 times per week for exercise  .  Outpatient Medications Prior to Visit  Medication Sig Dispense Refill  . amLODipine (NORVASC) 2.5 MG tablet Take 1 tablet (2.5 mg total) by mouth daily. At night for blood pressure 90 tablet 3  . fluticasone (FLONASE) 50 MCG/ACT nasal spray Place into both nostrils daily. Reported on 01/17/2015    .  latanoprost (XALATAN) 0.005 % ophthalmic solution INSTILL 1 DROP IN THE LEFT EYE AT NIGHT  5  . levothyroxine (SYNTHROID, LEVOTHROID) 50 MCG tablet TAKE 1 TABLET BY MOUTH DAILY 90 tablet 2  . Misc Natural Products (OSTEO BI-FLEX JOINT SHIELD PO) Take by mouth.    . Multiple Vitamins-Minerals (CENTRUM SILVER PO) Take by mouth.    . Multiple Vitamins-Minerals (PRESERVISION AREDS 2 PO) Take by mouth.    Vladimir Faster Glycol-Propyl Glycol (SYSTANE OP) Apply to eye.    . pravastatin (PRAVACHOL) 20 MG tablet TAKE 1 TABLET(20 MG) BY MOUTH DAILY 90 tablet 1  . traMADol (ULTRAM) 50 MG tablet Take 1 tablet (50 mg total) 2 (two) times daily by mouth. As needed for moderate pain due to sciatica 60 tablet 2  . loratadine (CLARITIN) 10 MG tablet Take 10 mg by mouth daily. Reported on 01/17/2015    . MISC NATURAL PRODUCTS PO Take by mouth. Peripheral nerve health     No facility-administered medications prior to visit.     Review of Systems;  Patient denies headache, fevers, malaise, unintentional weight loss, skin rash, eye pain, sinus congestion and sinus pain, sore throat, dysphagia,  hemoptysis , cough, dyspnea, wheezing, chest pain, palpitations, orthopnea, edema, abdominal pain, nausea, melena, diarrhea, constipation, flank pain, dysuria, hematuria, urinary  Frequency, nocturia, numbness, tingling, seizures,  Focal weakness, Loss of consciousness,  Tremor, insomnia, depression, anxiety, and suicidal ideation.      Objective:  BP (!) 146/58 (BP Location:  Left Arm, Patient Position: Sitting, Cuff Size: Normal)   Pulse 78   Temp 97.8 F (36.6 C) (Oral)   Resp 16   Ht 4\' 10"  (1.473 m)   Wt 105 lb 12.8 oz (48 kg)   LMP  (LMP Unknown)   SpO2 96%   BMI 22.11 kg/m   BP Readings from Last 3 Encounters:  06/03/17 (!) 146/58  04/13/17 (!) 160/82  01/27/17 128/72    Wt Readings from Last 3 Encounters:  06/03/17 105 lb 12.8 oz (48 kg)  04/13/17 106 lb (48.1 kg)  01/27/17 108 lb (49 kg)    General  appearance: alert, cooperative and appears stated age Ears: normal TM's and external ear canals both ears Throat: lips, mucosa, and tongue normal; teeth and gums normal Neck: no adenopathy, no carotid bruit, supple, symmetrical, trachea midline and thyroid not enlarged, symmetric, no tenderness/mass/nodules Back: symmetric, no curvature. ROM normal. No CVA tenderness. Lungs: clear to auscultation bilaterally Heart: regular rate and rhythm, S1, S2 normal, no murmur, click, rub or gallop Abdomen: soft, non-tender; bowel sounds normal; no masses,  no organomegaly Pulses: 2+ and symmetric Skin: Skin color, texture, turgor normal. No rashes or lesions Lymph nodes: Cervical, supraclavicular, and axillary nodes normal.  Lab Results  Component Value Date   HGBA1C 5.7 07/16/2014    Lab Results  Component Value Date   CREATININE 0.74 06/03/2017   CREATININE 0.74 11/25/2016   CREATININE 0.78 04/15/2016    Lab Results  Component Value Date   WBC 6.3 04/13/2017   HGB 15.0 04/13/2017   HCT 45.1 04/13/2017   PLT 269.0 04/13/2017   GLUCOSE 115 (H) 06/03/2017   CHOL 161 04/15/2016   TRIG 56.0 04/15/2016   HDL 79.50 04/15/2016   LDLCALC 71 04/15/2016   ALT 16 06/03/2017   AST 17 06/03/2017   NA 140 06/03/2017   K 4.2 06/03/2017   CL 104 06/03/2017   CREATININE 0.74 06/03/2017   BUN 14 06/03/2017   CO2 30 06/03/2017   TSH 1.41 06/03/2017   HGBA1C 5.7 07/16/2014   MICROALBUR <0.7 11/25/2016    Mm Digital Screening Bilateral  Result Date: 04/27/2017 CLINICAL DATA:  Screening. EXAM: DIGITAL SCREENING BILATERAL MAMMOGRAM WITH CAD COMPARISON:  Previous exam(s). ACR Breast Density Category c: The breast tissue is heterogeneously dense, which may obscure small masses. FINDINGS: There are no findings suspicious for malignancy. Images were processed with CAD. IMPRESSION: No mammographic evidence of malignancy. A result letter of this screening mammogram will be mailed directly to the patient.  RECOMMENDATION: Screening mammogram in one year. (Code:SM-B-01Y) BI-RADS CATEGORY  1: Negative. Electronically Signed   By: Ammie Ferrier M.D.   On: 04/27/2017 12:44    Assessment & Plan:   Problem List Items Addressed This Visit    Vitamin D deficiency - Primary   Relevant Orders   VITAMIN D 25 Hydroxy (Vit-D Deficiency, Fractures) (Completed)   Acquired hypothyroidism   Relevant Orders   TSH (Completed)   Pain of right hip joint pain    With prior Pelvic ramus fractures , likely cuasing pain due osteoarthritis changes       Lumbar spinal stenosis    Secondary to degenerative changes.  Patient states that her pain has rpersisted and is interfering with he social activities . She as started PT      Essential hypertension    Home readings are well controlled.  No changes today       Diarrhea in adult patient    Mixed  with constipation,  Trial of miralaX DAILY        Other Visit Diagnoses    Long-term use of high-risk medication       Relevant Orders   Comprehensive metabolic panel (Completed)    A total of 25 minutes of face to face time was spent with patient more than half of which was spent in counselling about the above mentioned conditions  and coordination of care   I have discontinued Ilean Jago's loratadine and MISC NATURAL PRODUCTS PO. I have also changed her traMADol. Additionally, I am having her maintain her Multiple Vitamins-Minerals (PRESERVISION AREDS 2 PO), Polyethyl Glycol-Propyl Glycol (SYSTANE OP), Multiple Vitamins-Minerals (CENTRUM SILVER PO), Misc Natural Products (OSTEO BI-FLEX JOINT SHIELD PO), fluticasone, latanoprost, pravastatin, levothyroxine, and amLODipine.  Meds ordered this encounter  Medications  . traMADol (ULTRAM) 50 MG tablet    Sig: Take 1 tablet (50 mg total) by mouth every 12 (twelve) hours as needed. As needed for moderate pain due to sciatica and osteoporosis    Dispense:  60 tablet    Refill:  5    Medications Discontinued  During This Encounter  Medication Reason  . loratadine (CLARITIN) 10 MG tablet Patient has not taken in last 30 days  . MISC NATURAL PRODUCTS PO Patient has not taken in last 30 days  . traMADol (ULTRAM) 50 MG tablet Reorder    Follow-up: Return in about 6 months (around 12/04/2017).   Crecencio Mc, MD

## 2017-06-06 NOTE — Assessment & Plan Note (Signed)
Mixed with constipation,  Trial of miralaX DAILY

## 2017-06-06 NOTE — Assessment & Plan Note (Signed)
Home readings are well controlled.  No changes today

## 2017-06-06 NOTE — Assessment & Plan Note (Signed)
With prior Pelvic ramus fractures , likely cuasing pain due osteoarthritis changes

## 2017-06-06 NOTE — Assessment & Plan Note (Signed)
Secondary to degenerative changes.  Patient states that her pain has rpersisted and is interfering with he social activities . She as started PT

## 2017-06-15 DIAGNOSIS — M545 Low back pain: Secondary | ICD-10-CM | POA: Diagnosis not present

## 2017-06-15 DIAGNOSIS — M4716 Other spondylosis with myelopathy, lumbar region: Secondary | ICD-10-CM | POA: Diagnosis not present

## 2017-06-15 DIAGNOSIS — M6281 Muscle weakness (generalized): Secondary | ICD-10-CM | POA: Diagnosis not present

## 2017-06-23 ENCOUNTER — Ambulatory Visit: Payer: Self-pay | Admitting: *Deleted

## 2017-06-23 ENCOUNTER — Telehealth: Payer: Self-pay | Admitting: Internal Medicine

## 2017-06-23 DIAGNOSIS — G44209 Tension-type headache, unspecified, not intractable: Secondary | ICD-10-CM | POA: Diagnosis not present

## 2017-06-23 NOTE — Telephone Encounter (Signed)
Please see triage note. Patient was advised to go to urgent care. She would like someone to give her a call in reference to getting and MRI

## 2017-06-23 NOTE — Telephone Encounter (Signed)
Patient triaged by [facility] nurse and is going to Urgent care

## 2017-06-23 NOTE — Telephone Encounter (Signed)
Pt called with a c/o pain on the left side of her head that goes to the top of her head. She denies fever, headache (she states this is not a headache), nausea or vomiting or any vision problems.  Per protocol, advised to go to urgent care (unable to get an appointment for today or tomorrow at her pcp). We were disconnected during the phone call. Call patient back and she is in agreement to go to [facility].  Flow (Patina) at [facility] at [facility]. Will call back if symptoms increase.  Reason for Disposition . Nursing judgment or information in reference  Answer Assessment - Initial Assessment Questions 1. REASON FOR CALL: "What is your main concern right now?"     Pain on the left side of head 2. ONSET: "When did the ___ start?"     A couple of days ago 3. SEVERITY: "How bad is the ___?"     Pain # 7 or 8 when it comes and now #6 and there all the time 4. FEVER: "Do you have a fever?"     no 5. OTHER SYMPTOMS: "Do you have any other new symptoms?"     none 6. INTERVENTIONS AND RESPONSE: "What have you done so far to try to make this better? What medications have you used?"   Took Tylenol yesterday morning and took Aleve last night and neither one helped.  Protocols used: NO GUIDELINE AVAILABLE-A-AH

## 2017-06-23 NOTE — Telephone Encounter (Addendum)
Left voicemail to call on cell [phone], please transfer to office. THanks

## 2017-06-23 NOTE — Telephone Encounter (Signed)
FYI patient was triaged advised to go to urgent care for evaluation for head pain. Spoke to patient  she states she was having left sided head pain radiating to top head.   She was evaluated at the urgent care they stated that the pain was coming from neck due to bending over from crafting advised her to take Aleve OTC .

## 2017-06-23 NOTE — Telephone Encounter (Signed)
Pt was triaged and advised to go to urgent care. She thinks she needs a MRI. Pt called back and said she had spoken someone at the urgent care and they told her that it could not be done there. Pt was advised to go to urgent care to be evaluated for her problem.  No one can tell her that she needs a MRI until she has been examined by a provider.  She would like a call back from her provider to discuss her situation.  Her cell # is [phone].  But she did state that she would go to urgent care today.

## 2017-07-21 ENCOUNTER — Encounter: Payer: Self-pay | Admitting: Family Medicine

## 2017-07-21 ENCOUNTER — Ambulatory Visit (INDEPENDENT_AMBULATORY_CARE_PROVIDER_SITE_OTHER): Payer: PPO | Admitting: Family Medicine

## 2017-07-21 VITALS — BP 118/62 | HR 64 | Temp 98.6°F | Wt 105.1 lb

## 2017-07-21 DIAGNOSIS — G44209 Tension-type headache, unspecified, not intractable: Secondary | ICD-10-CM | POA: Diagnosis not present

## 2017-07-21 NOTE — Patient Instructions (Signed)
Please use either acetaminophen for discomfort. You can try ibuprofen 400 mg with food every 6 hours as a trial for discomfort.  You can consider physical therapy and if symptoms do not improve, imaging of your head can be considered.    Tension Headache A tension headache is pain, pressure, or aching that is felt over the front and sides of your head. These headaches can last from 30 minutes to several days. Follow these instructions at home: Managing pain  Take over-the-counter and prescription medicines only as told by your doctor.  Lie down in a dark, quiet room when you have a headache.  If directed, apply ice to your head and neck area: ? Put ice in a plastic bag. ? Place a towel between your skin and the bag. ? Leave the ice on for 20 minutes, 2-3 times per day.  Use a heating pad or a hot shower to apply heat to your head and neck area as told by your doctor. Eating and drinking  Eat meals on a regular schedule.  Do not drink a lot of alcohol.  Do not use a lot of caffeine, or stop using caffeine. General instructions  Keep all follow-up visits as told by your doctor. This is important.  Keep a journal to find out if certain things bring on headaches. For example, write down: ? What you eat and drink. ? How much sleep you get. ? Any change to your diet or medicines.  Try getting a massage, or doing other things that help you to relax.  Lessen stress.  Sit up straight. Do not tighten (tense) your muscles.  Do not use tobacco products. This includes cigarettes, chewing tobacco, or e-cigarettes. If you need help quitting, ask your doctor.  Exercise regularly as told by your doctor.  Get enough sleep. This may mean 7-9 hours of sleep. Contact a doctor if:  Your symptoms are not helped by medicine.  You have a headache that feels different from your usual headache.  You feel sick to your stomach (nauseous) or you throw up (vomit).  You have a fever. Get help  right away if:  Your headache becomes very bad.  You keep throwing up.  You have a stiff neck.  You have trouble seeing.  You have trouble speaking.  You have pain in your eye or ear.  Your muscles are weak or you lose muscle control.  You lose your balance or you have trouble walking.  You feel like you will pass out (faint) or you pass out.  You have confusion. This information is not intended to replace advice given to you by your health care provider. Make sure you discuss any questions you have with your health care provider. Document Released: 03/24/2009 Document Revised: 08/28/2015 Document Reviewed: 04/22/2014 Elsevier Interactive Patient Education  [employer].

## 2017-07-21 NOTE — Progress Notes (Signed)
Patient ID: Gloria Carr, female   DOB: 08-Sep-1927, 84 y.o.   MRN: [id number]  PCP: Crecencio Mc, MD  Subjective:  Gloria Carr is a 84 y.o. year old very pleasant female patient who presents with a Headache  -started: 3 weeks ago, symptoms are noted daily but not worsening  -Pain: throbbing when present   Site: near the back of her head/left side  Radiation: No -Duration: for 3 weeks  -Trigger: with crafting for extended periods of time -Trauma: No -Stressors: No  -Environmental Factors: No -Relation to Food/Alcohol: No -Nausea/Vomiting: No -Fever: No -Weight Loss: No -History of CA/immunosuppresion: No -Visual Changes: No Dizziness: No Neck Pain: No -Seizures: No Denies light sensitivity  -previous treatments: One dose of acetaminophen. She has not tried further treatment as she reports that she does not want to be "addicted" to medication -sick contacts/travel/risks: No recent sick contact exposure or travel -Family Hx of headache: No   She was evaluated 06/23/17 for a headache in urgent care where she was noted to have a tension headache. She was advised to take acetaminophen and complete stretching as symptoms may have been related to bending her head down as she crafts on a regular basis. She has had one dose of acetaminophen that provided benefit for patient. On 01/27/17 she was evaluated for similar symptoms and noted to be experiencing a tension type headache. She was advised to try aspercreme or capsaicin cream and offered PT. She has not tried any of these therapies.     ROS-denies fever, chills, thunderclap HA, sinus pressure/pain, ear pain,   Pertinent Past Medical History- HTN  Medications- reviewed  Current Outpatient Medications  Medication Sig Dispense Refill  . acetaminophen (TYLENOL) 500 MG tablet Take 500 mg by mouth every 6 (six) hours as needed.    Marland Kitchen amLODipine (NORVASC) 2.5 MG tablet Take 1 tablet (2.5 mg total) by mouth daily. At night for blood  pressure 90 tablet 3  . fluticasone (FLONASE) 50 MCG/ACT nasal spray Place into both nostrils daily. Reported on 01/17/2015    . latanoprost (XALATAN) 0.005 % ophthalmic solution INSTILL 1 DROP IN THE LEFT EYE AT NIGHT  5  . levothyroxine (SYNTHROID, LEVOTHROID) 50 MCG tablet TAKE 1 TABLET BY MOUTH DAILY 90 tablet 2  . Misc Natural Products (OSTEO BI-FLEX JOINT SHIELD PO) Take by mouth.    . Multiple Vitamins-Minerals (CENTRUM SILVER PO) Take by mouth.    . Multiple Vitamins-Minerals (PRESERVISION AREDS 2 PO) Take by mouth.    Vladimir Faster Glycol-Propyl Glycol (SYSTANE OP) Apply to eye.    . pravastatin (PRAVACHOL) 20 MG tablet TAKE 1 TABLET(20 MG) BY MOUTH DAILY 90 tablet 1   No current facility-administered medications for this visit.     Objective: BP 118/62 (BP Location: Left Arm, Patient Position: Sitting, Cuff Size: Normal)   Pulse 64   Temp 98.6 F (37 C) (Oral)   Wt 105 lb 2 oz (47.7 kg)   LMP  (LMP Unknown)   SpO2 97%   BMI 21.97 kg/m  Gen: NAD, resting comfortably HEENT: Oropharynx clear and moist CV: RRR no murmurs rubs or gallops Lungs: CTAB no crackles, wheeze, rhonchi Abdomen: soft/nontender/nondistended/normal bowel sounds. No rebound or guarding.  Ext: no edema Skin: warm, dry, no rash Neuro: II-Visual fields grossly intact. III/IV/VI-Extraocular movements intact. Pupils reactive bilaterally. V/VII-Smile symmetric, equal eyebrow raise, facial sensation intact VIII- Hearing grossly intact XI-bilateral shoulder shrug XII-midline tongue extension Motor: 5/5 bilaterally with normal tone and bulk Cerebellar: Normal finger-to-nose and  normal heel-to-shin test.  Romberg negative Ambulates with a coordinated gait  Assessment/Plan: 1. Tension headache Exam is reassuring today. Neuro exam is WNL and patient appears well today. Prior evaluation on 01/27/17 revealed tension headache symptoms. Patient reports that she has not tried to treat her headache as recommended  but will occasionally take one Aleve or acetaminophen that does help with discomfort. She has continued to complete crafts and has tried to make adjustments with head position and will continue to do so. With reassuring exam, we discussed a trial of treating headache with a short course of ibuprofen 400 mg every 6 hours with food or acetaminophen if she chooses Tylenol instead and remaining hydrated. Further advised her that PT can be considered. Close return precautions advised and we discussed imaging if no improvement with conservative measures.  History and exam today are suggestive of tension headache that are likely triggered with bending head forward with crafting for extended periods of time.   We discussed that exam and history are reassuring.  Close follow up if treatment does not improve symptoms.  Also, reviewed the importance of assessing for rebound HAs that can occur with 10 treatments/month.  Finally, we reviewed reasons to return to care including if symptoms worsen or persist or new concerns arise- once again particularly worsening HA, vomiting, stiff neck, or visual changes.   Laurita Quint, FNP

## 2017-07-29 DIAGNOSIS — H401131 Primary open-angle glaucoma, bilateral, mild stage: Secondary | ICD-10-CM | POA: Diagnosis not present

## 2017-08-05 DIAGNOSIS — H353114 Nonexudative age-related macular degeneration, right eye, advanced atrophic with subfoveal involvement: Secondary | ICD-10-CM | POA: Diagnosis not present

## 2017-08-08 ENCOUNTER — Telehealth: Payer: Self-pay | Admitting: Internal Medicine

## 2017-08-08 NOTE — Telephone Encounter (Signed)
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

## 2017-08-11 ENCOUNTER — Ambulatory Visit: Payer: PPO

## 2017-08-12 ENCOUNTER — Other Ambulatory Visit: Payer: Self-pay | Admitting: Internal Medicine

## 2017-08-15 ENCOUNTER — Other Ambulatory Visit: Payer: Self-pay | Admitting: Internal Medicine

## 2017-08-31 ENCOUNTER — Ambulatory Visit: Payer: Self-pay

## 2017-08-31 NOTE — Telephone Encounter (Signed)
Returned call to pt.  Reported she continues to have daily headaches; stated it hurts on left side of head from behind the ear to top of the head.  Rated pain at 6/10; reported it varies in intensity.  Denied dizziness, unilateral extremity weakness, speech problems, fever/ chills.  Stated she has some imbalance issues due to neuropathy.  Reported her Ophthalmologist is evaluating her blurred vision due to "film over cataracts."  Voiced concern that she feels a lump on left side of head.  Stated she is concerned that the headaches are not resolving.  Reported she has been using Advil twice a day, and stretching exercises as recommended by PT.  Also applying cold compress alternating with warm compress for the pain.  Questioned about any injury to her head; related a fall in late June; "I fell backwards on my bottom, and bumped my head on the dishwasher."  Stated "I don't remember that my head hurt at that time."   Appt. Scheduled for 09/01/17 with PA, for further evaluation.  Care advice given per protocol.  Verb. Understanding and agreed with plan.               Reason for Disposition . [1] MILD-MODERATE headache AND [2] present > 72 hours  Answer Assessment - Initial Assessment Questions 1. LOCATION: "Where does it hurt?"      Left side of head, behind the ear and to top of head 2. ONSET: "When did the headache start?" (Minutes, hours or days)      Approx. 8 weeks 3. PATTERN: "Does the pain come and go, or has it been constant since it started?"     Intermittent 4. SEVERITY: "How bad is the pain?" and "What does it keep you from doing?"  (e.g., Scale 1-10; mild, moderate, or severe)   - MILD (1-3): doesn't interfere with normal activities    - MODERATE (4-7): interferes with normal activities or awakens from sleep    - SEVERE (8-10): excruciating pain, unable to do any normal activities        6/10; but it can vary in intensity  5. RECURRENT SYMPTOM: "Have you ever had headaches before?" If so,  ask: "When was the last time?" and "What happened that time?"      For about 2 mos.  6. CAUSE: "What do you think is causing the headache?"     unknown 7. MIGRAINE: "Have you been diagnosed with migraine headaches?" If so, ask: "Is this headache similar?"      denied 8. HEAD INJURY: "Has there been any recent injury to the head?"      Fell backwards, landed on buttocks and bumped head on dishwasher in late June  9. OTHER SYMPTOMS: "Do you have any other symptoms?" (fever, stiff neck, eye pain, sore throat, cold symptoms)     Seeing eye doctor currently for blurring vision; informed film over cataract. Denied dizziness, unilateral weakness, change in speech, fever/ chills.  Stated some imbalance with neuropathy  10. PREGNANCY: "Is there any chance you are pregnant?" "When was your last menstrual period?"       n/a  Protocols used: HEADACHE-A-AH  Message from Lennox Solders sent at 08/31/2017 10:41 AM EDT   Summary: please advice   Pt saw julia on 07/21/17 for ongoing headache and pt is still having headaches over a month later. Please advise.

## 2017-09-01 ENCOUNTER — Encounter: Payer: Self-pay | Admitting: Family Medicine

## 2017-09-01 ENCOUNTER — Ambulatory Visit (INDEPENDENT_AMBULATORY_CARE_PROVIDER_SITE_OTHER): Payer: PPO | Admitting: Family Medicine

## 2017-09-01 ENCOUNTER — Ambulatory Visit (INDEPENDENT_AMBULATORY_CARE_PROVIDER_SITE_OTHER): Payer: PPO

## 2017-09-01 DIAGNOSIS — G44229 Chronic tension-type headache, not intractable: Secondary | ICD-10-CM

## 2017-09-01 DIAGNOSIS — M47812 Spondylosis without myelopathy or radiculopathy, cervical region: Secondary | ICD-10-CM | POA: Diagnosis not present

## 2017-09-01 MED ORDER — SUMATRIPTAN SUCCINATE 25 MG PO TABS: 25.0000 mg | ORAL_TABLET | ORAL | 1 refills | Status: DC | PRN

## 2017-09-01 NOTE — Patient Instructions (Signed)
Great to meet you! 

## 2017-09-01 NOTE — Progress Notes (Signed)
Subjective:    Patient ID: Gloria Carr, female    DOB: Sep 22, 1927, 84 y.o.   MRN: [id number]  HPI   Patient presents to clinic due to continued headache on the left lower part of head.  She has been experiencing similar headache symptoms off and on since January 2019.    She was seen in June 2019 for a headache at the urgent care, was diagnosed with tension type headache and advised to try Tylenol or Motrin and do stretching exercises.  Tylenol or Motrin do help for a short time but headaches still return. patient has also tried different muscle rub type creams on back of neck & ice packs on back of head and will get minimal relief for short time but pain always returns.  Patient states her headache has been attributed to her doing crafts and always having head bent forward for long periods of time.  Patient states she went on vacation for the last 10 days, and has done no crafting but still has headache in left lower back of head.  Denies any vision changes other than her already known cataract issues.  Denies any dizziness or speech difficulty.  Denies any fainting.  Denies any numbness or tingling in extremities, denies weakness of extremities.  Patient Active Problem List   Diagnosis Date Noted  . Diarrhea in adult patient 04/14/2017  . Pain of right hip joint pain 04/14/2017  . Acute non intractable tension-type headache 01/27/2017  . Lumbar spinal stenosis 12/15/2016  . Essential hypertension 11/27/2016  . Closed fracture of left superior pubic ramus, with routine healing, subsequent encounter 08/24/2016  . Pure hypercholesterolemia 04/15/2016  . Vitamin D deficiency 04/15/2016  . Osteoporosis 12/25/2015  . Edema 10/18/2015  . Visit for preventive health examination 01/19/2015  . Acquired hypothyroidism 07/16/2014  . Hereditary and idiopathic peripheral neuropathy 07/16/2014   Social History   Tobacco Use  . Smoking status: Former Research scientist (life sciences)  . Smokeless tobacco: Never Used    Substance Use Topics  . Alcohol use: Yes    Alcohol/week: 5.0 standard drinks    Types: 5 Standard drinks or equivalent per week    Comment: with dinner   Review of Systems  Constitutional: Negative for chills, fatigue and fever.  HENT: Negative for congestion, ear pain, sinus pain and sore throat.   Eyes: Negative.   Respiratory: Negative for cough, shortness of breath and wheezing.   Cardiovascular: Negative for chest pain, palpitations and leg swelling.  Gastrointestinal: Negative for abdominal pain, diarrhea, nausea and vomiting.  Genitourinary: Negative for dysuria, frequency and urgency.  Musculoskeletal: Negative for arthralgias and myalgias.  Skin: Negative for color change, pallor and rash.  Neurological: Negative for syncope, light-headedness. Positive for Headaches.  Psychiatric/Behavioral: The patient is not nervous/anxious.       Objective:   Physical Exam  Constitutional: She is oriented to person, place, and time. She appears well-developed and well-nourished.  Non-toxic appearance. She does not appear ill. No distress.  HENT:  Head: Normocephalic and atraumatic.    Red diagram on circle indicates area of head where pain occurs with headache.  Eyes: Pupils are equal, round, and reactive to light. EOM are normal. No scleral icterus. Right eye exhibits no nystagmus. Left eye exhibits no nystagmus. Right pupil is round. Left pupil is round. Pupils are equal.  Neck: Normal range of motion. Neck supple. No neck rigidity. No tracheal deviation present.  Cardiovascular: Normal rate and regular rhythm.  No murmur heard. Pulmonary/Chest: Effort normal and  breath sounds normal. No respiratory distress.  Neurological: She is alert and oriented to person, place, and time. No cranial nerve deficit or sensory deficit.  Smile symmetrical.  Speech clear.  Able to raise eyebrows, puff out cheeks, clenched teeth without issue.  Gait normal.  Skin: Skin is warm and dry. No pallor.   Nursing note and vitals reviewed.   Vitals:   09/01/17 1304  BP: 114/64  Pulse: 64  Resp: 14  Temp: 98.2 F (36.8 C)  SpO2: 96%      Assessment & Plan:    Chronic headache - patient's neuro exam is normal, she is a healthy and active 84 year old. Due to length of time patient has been having these headaches I think imaging is warranted.  We will get x-ray of cervical spine to rule out any arthritis in the neck we will also get MRI brain. patient advised she may continue to use Tylenol as needed for pain and can also use ice pack on head.  She will trial low-dose sumatriptan as needed any difference with pain --  Patient aware that the maximum amount of medication for 24 hours with this medicine is 200 mg, but she needs to take more than 2 tablets in a day she has been advised to contact this office.   Keep appt as scheduled in November 2019

## 2017-09-05 ENCOUNTER — Telehealth: Payer: Self-pay | Admitting: *Deleted

## 2017-09-05 NOTE — Telephone Encounter (Signed)
Copied from [place] [id number]. Topic: General - Other >> Sep 05, 2017  1:56 PM Mcneil, Ja-Kwan wrote: Reason for CRM: Pt states the Rx for SUMAtriptan (IMITREX) 25 MG tablet really is not working. Pt states she tried to get a refill and she was told that the Rx could not be refilled and covered by her insurance as the Rx was a 30 day supply. Pt states she only received 10 pills and she would like a call back. Cb# [phone]

## 2017-09-07 ENCOUNTER — Other Ambulatory Visit: Payer: Self-pay | Admitting: Internal Medicine

## 2017-09-07 ENCOUNTER — Encounter: Payer: Self-pay | Admitting: *Deleted

## 2017-09-07 MED ORDER — MELOXICAM 15 MG PO TABS: 15.0000 mg | ORAL_TABLET | Freq: Every day | ORAL | 0 refills | Status: DC

## 2017-09-07 NOTE — Telephone Encounter (Signed)
Patient saw Lauren for  Headaches and was given the sumatriptan as a TRIAL. If it did not help,  She should not refill it.   Continue tylenol 500 mg every 8 hours and I will call in meloxicam to take instead of motrin/alevel.  ONCE DAILY   PROCEED WITH THE IMAGING STUDIES LAUREN ORDERED

## 2017-09-08 NOTE — Telephone Encounter (Signed)
Patient notified and voiced understanding.

## 2017-09-21 ENCOUNTER — Ambulatory Visit
Admission: RE | Admit: 2017-09-21 | Discharge: 2017-09-21 | Disposition: A | Discharge status: Home / Self Care | Payer: PPO | Source: Ambulatory Visit | Attending: Family Medicine | Admitting: Family Medicine

## 2017-09-21 DIAGNOSIS — G44229 Chronic tension-type headache, not intractable: Secondary | ICD-10-CM

## 2017-09-21 DIAGNOSIS — R9082 White matter disease, unspecified: Secondary | ICD-10-CM | POA: Diagnosis not present

## 2017-09-21 DIAGNOSIS — G319 Degenerative disease of nervous system, unspecified: Secondary | ICD-10-CM | POA: Diagnosis not present

## 2017-09-21 NOTE — Telephone Encounter (Signed)
Patient scheduled for 09/28/17

## 2017-09-23 NOTE — Addendum Note (Signed)
Addended by: Philis Nettle on: 09/23/2017 07:26 AM   Modules accepted: Orders

## 2017-09-28 ENCOUNTER — Ambulatory Visit (INDEPENDENT_AMBULATORY_CARE_PROVIDER_SITE_OTHER): Payer: PPO

## 2017-09-28 DIAGNOSIS — M8000XS Age-related osteoporosis with current pathological fracture, unspecified site, sequela: Secondary | ICD-10-CM | POA: Diagnosis not present

## 2017-09-28 MED ORDER — DENOSUMAB 60 MG/ML ~~LOC~~ SOSY
60.0000 mg | PREFILLED_SYRINGE | Freq: Once | SUBCUTANEOUS | Status: AC
  Administered 2017-09-28: 60 mg via SUBCUTANEOUS

## 2017-09-28 NOTE — Progress Notes (Signed)
Patient presented for Prolia injection to left arm [place], patient voiced no concerns or complaints during or after injection. 

## 2017-10-12 ENCOUNTER — Ambulatory Visit
Admission: RE | Admit: 2017-10-12 | Discharge: 2017-10-12 | Disposition: A | Discharge status: Home / Self Care | Payer: PPO | Source: Ambulatory Visit | Attending: Family Medicine | Admitting: Family Medicine

## 2017-10-12 DIAGNOSIS — M47812 Spondylosis without myelopathy or radiculopathy, cervical region: Secondary | ICD-10-CM | POA: Insufficient documentation

## 2017-10-12 DIAGNOSIS — R9389 Abnormal findings on diagnostic imaging of other specified body structures: Secondary | ICD-10-CM | POA: Diagnosis not present

## 2017-10-12 DIAGNOSIS — G44229 Chronic tension-type headache, not intractable: Secondary | ICD-10-CM | POA: Diagnosis not present

## 2017-10-12 DIAGNOSIS — M50221 Other cervical disc displacement at C4-C5 level: Secondary | ICD-10-CM | POA: Diagnosis not present

## 2017-10-12 DIAGNOSIS — M542 Cervicalgia: Secondary | ICD-10-CM | POA: Diagnosis not present

## 2017-10-13 ENCOUNTER — Ambulatory Visit: Payer: PPO | Admitting: Internal Medicine

## 2017-10-14 ENCOUNTER — Telehealth: Payer: Self-pay

## 2017-10-14 NOTE — Telephone Encounter (Signed)
Copied from [place] [id number]. Topic: General - Call Back - No Documentation >> Oct 14, 2017 10:58 AM Conception Chancy, NT wrote: Reason for CRM: patient is calling and states she missed a call and believes it is about her MRI she would like a call back, she will be home until 130pm.

## 2017-10-25 DIAGNOSIS — H26491 Other secondary cataract, right eye: Secondary | ICD-10-CM | POA: Diagnosis not present

## 2017-11-14 DIAGNOSIS — D2262 Melanocytic nevi of left upper limb, including shoulder: Secondary | ICD-10-CM | POA: Diagnosis not present

## 2017-11-14 DIAGNOSIS — D2261 Melanocytic nevi of right upper limb, including shoulder: Secondary | ICD-10-CM | POA: Diagnosis not present

## 2017-11-14 DIAGNOSIS — D225 Melanocytic nevi of trunk: Secondary | ICD-10-CM | POA: Diagnosis not present

## 2017-11-14 DIAGNOSIS — L821 Other seborrheic keratosis: Secondary | ICD-10-CM | POA: Diagnosis not present

## 2017-11-14 DIAGNOSIS — D2272 Melanocytic nevi of left lower limb, including hip: Secondary | ICD-10-CM | POA: Diagnosis not present

## 2017-11-14 DIAGNOSIS — D2271 Melanocytic nevi of right lower limb, including hip: Secondary | ICD-10-CM | POA: Diagnosis not present

## 2017-11-18 DIAGNOSIS — H353122 Nonexudative age-related macular degeneration, left eye, intermediate dry stage: Secondary | ICD-10-CM | POA: Diagnosis not present

## 2017-12-07 ENCOUNTER — Encounter: Payer: Self-pay | Admitting: Internal Medicine

## 2017-12-07 ENCOUNTER — Ambulatory Visit (INDEPENDENT_AMBULATORY_CARE_PROVIDER_SITE_OTHER): Payer: PPO | Admitting: Internal Medicine

## 2017-12-07 VITALS — BP 130/68 | HR 74 | Temp 97.6°F | Resp 16 | Ht <= 58 in | Wt 108.6 lb

## 2017-12-07 DIAGNOSIS — E039 Hypothyroidism, unspecified: Secondary | ICD-10-CM

## 2017-12-07 DIAGNOSIS — I10 Essential (primary) hypertension: Secondary | ICD-10-CM | POA: Diagnosis not present

## 2017-12-07 DIAGNOSIS — M4802 Spinal stenosis, cervical region: Secondary | ICD-10-CM

## 2017-12-07 MED ORDER — MELOXICAM 7.5 MG PO TABS: 7.5000 mg | ORAL_TABLET | Freq: Every day | ORAL | 5 refills | Status: DC

## 2017-12-07 NOTE — Progress Notes (Signed)
Subjective:  Patient ID: Gloria Carr, female    DOB: 17-Feb-1927  Age: 84 y.o. MRN: [id number]  CC: The primary encounter diagnosis was Essential hypertension. Diagnoses of Degenerative cervical spinal stenosis and Acquired hypothyroidism were also pertinent to this visit.  HPI Gloria Carr presents for 6 month follow up  [place]  Had cervical spine MRI done one month ago due to persistent headaches, now resolved after 10 days of meloxicam. She has multi level degenerative disk disease resulting in severe Spinal stenosis.   Neurosurgical referral discussed, to [facility].  She denies numbenss and weakess of UE's hands,  Some chronic neuropathy of feet   Lives at [place].  Has DNR order tha needs discussion   Cc:  Mild to moderate joint pain from OA.   Not taking meloxicam since she stopped it when her neck pain esolved     Still having several stools daily , small volume.  become Increasingly softer as the morning progresses.   Not liquid .  Reviewed old MRI's and x rays,  Pelvic fractures,  Old noted in April   Discussed DNR she brought in .  NOT SIGNED AS SHE DID NOT UNDERSTAND the difference between living will and DNR     Outpatient Medications Prior to Visit  Medication Sig Dispense Refill  . amLODipine (NORVASC) 2.5 MG tablet Take 1 tablet (2.5 mg total) by mouth daily. At night for blood pressure 90 tablet 3  . fluticasone (FLONASE) 50 MCG/ACT nasal spray Place into both nostrils daily. Reported on 01/17/2015    . ibuprofen (ADVIL,MOTRIN) 200 MG tablet Take 200 mg by mouth every 6 (six) hours as needed.    . latanoprost (XALATAN) 0.005 % ophthalmic solution INSTILL 1 DROP IN THE LEFT EYE AT NIGHT  5  . levothyroxine (SYNTHROID, LEVOTHROID) 50 MCG tablet TAKE 1 TABLET BY MOUTH DAILY 90 tablet 2  . Multiple Vitamins-Minerals (CENTRUM SILVER PO) Take by mouth.    . Multiple Vitamins-Minerals (PRESERVISION AREDS 2 PO) Take by mouth.    Vladimir Faster Glycol-Propyl  Glycol (SYSTANE OP) Apply to eye.    . pravastatin (PRAVACHOL) 20 MG tablet TAKE 1 TABLET(20 MG) BY MOUTH DAILY 90 tablet 1  . pravastatin (PRAVACHOL) 20 MG tablet TAKE 1 TABLET(20 MG) BY MOUTH DAILY 90 tablet 1  . Misc Natural Products (OSTEO BI-FLEX JOINT SHIELD PO) Take by mouth.    . meloxicam (MOBIC) 15 MG tablet Take 1 tablet (15 mg total) by mouth daily. (Patient not taking: Reported on 12/07/2017) 30 tablet 0   No facility-administered medications prior to visit.     Review of Systems;  Patient denies headache, fevers, malaise, unintentional weight loss, skin rash, eye pain, sinus congestion and sinus pain, sore throat, dysphagia,  hemoptysis , cough, dyspnea, wheezing, chest pain, palpitations, orthopnea, edema, abdominal pain, nausea, melena, diarrhea, constipation, flank pain, dysuria, hematuria, urinary  Frequency, nocturia, numbness, tingling, seizures,  Focal weakness, Loss of consciousness,  Tremor, insomnia, depression, anxiety, and suicidal ideation.      Objective:  BP 130/68 (BP Location: Left Arm, Patient Position: Sitting, Cuff Size: Normal)   Pulse 74   Temp 97.6 F (36.4 C) (Oral)   Resp 16   Ht _0  (1.473 m)   Wt 108 lb 9.6 oz (49.3 kg)   LMP  (LMP Unknown)   SpO2 95%   BMI 22.70 kg/m   BP Readings from Last 3 Encounters:  12/07/17 130/68  09/01/17 114/64  07/21/17 118/62  Wt Readings from Last 3 Encounters:  12/07/17 108 lb 9.6 oz (49.3 kg)  09/01/17 103 lb 8 oz (46.9 kg)  07/21/17 105 lb 2 oz (47.7 kg)    General appearance: alert, cooperative and appears stated age Ears: normal TM's and external ear canals both ears Throat: lips, mucosa, and tongue normal; teeth and gums normal Neck: no adenopathy, no carotid bruit, supple, symmetrical, trachea midline and thyroid not enlarged, symmetric, no tenderness/mass/nodules Back: symmetric, no curvature. ROM normal. No CVA tenderness. Lungs: clear to auscultation bilaterally Heart: regular rate and  rhythm, S1, S2 normal, no murmur, click, rub or gallop Abdomen: soft, non-tender; bowel sounds normal; no masses,  no organomegaly Pulses: 2+ and symmetric Skin: Skin color, texture, turgor normal. No rashes or lesions Lymph nodes: Cervical, supraclavicular, and axillary nodes normal.  Lab Results  Component Value Date   HGBA1C 5.7 07/16/2014    Lab Results  Component Value Date   CREATININE 0.74 06/03/2017   CREATININE 0.74 11/25/2016   CREATININE 0.78 04/15/2016    Lab Results  Component Value Date   WBC 6.3 04/13/2017   HGB 15.0 04/13/2017   HCT 45.1 04/13/2017   PLT 269.0 04/13/2017   GLUCOSE 115 (H) 06/03/2017   CHOL 161 04/15/2016   TRIG 56.0 04/15/2016   HDL 79.50 04/15/2016   LDLCALC 71 04/15/2016   ALT 16 06/03/2017   AST 17 06/03/2017   NA 140 06/03/2017   K 4.2 06/03/2017   CL 104 06/03/2017   CREATININE 0.74 06/03/2017   BUN 14 06/03/2017   CO2 30 06/03/2017   TSH 1.41 06/03/2017   HGBA1C 5.7 07/16/2014   MICROALBUR <0.7 11/25/2016    Mr Cervical Spine Wo Contrast  Result Date: 10/12/2017 CLINICAL DATA:  Left-sided headache and left neck pain since a fall in April, 2019. EXAM: MRI CERVICAL SPINE WITHOUT CONTRAST TECHNIQUE: Multiplanar, multisequence MR imaging of the cervical spine was performed. No intravenous contrast was administered. COMPARISON:  Plain film cervical spine 09/01/2017. FINDINGS: Alignment: Trace facet mediated anterolisthesis C7 on T1 is identified. Vertebrae: No fracture or worrisome lesion. Multilevel degenerative endplate signal change is present. Cord: Demonstrates normal signal. Posterior Fossa, vertebral arteries, paraspinal tissues: Negative. Disc levels: C1-2: There is marked thickening of the transverse ligament of the atlas eccentric toward the left where the ligament measures 1.6 cm transverse x 1.9 cm AP x 2.8 cm craniocaudal. The ventral and left lateral aspects of the upper cord are deformed and the cord is slightly deflected to  the right. No frank cord compression is identified. Erosive change is seen in the dense subjacent to the thickened ligament. C2-3: Shallow left paracentral protrusion indents the ventral thecal sac. Mild-to-moderate right foraminal narrowing is present. The left foramen is open. C3-4: Disc bulge and uncovertebral spurring are identified. The ventral cord is slightly deformed. Moderately severe to severe foraminal narrowing is worse on the right. C4-5: Large central and left paracentral protrusion markedly flattens the cord. Uncovertebral disease causes severe bilateral foraminal narrowing. C5-6: There is some facet degenerative disease. Shallow disc bulge is present but the central canal and right foramen are open. Mild left foraminal narrowing is noted. C6-7: Shallow disc bulge and uncovertebral spurring are identified. The central canal is mildly narrowed. The right foramen is open. Severe left foraminal narrowing is present. C7-T1: Facet degenerative disease. There is slight disc uncovering. No stenosis. IMPRESSION: Negative for posttraumatic change. 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 The pseudotumor deforms the cord and deflects it to the right. Multilevel spondylosis is worst at C4-5 where there is marked flattening of the cord by a left paracentral disc protrusion and severe bilateral foraminal narrowing. Please see above for descriptions of individual levels. Electronically Signed   By: Inge Rise M.D.   On: 10/12/2017 14:16    Assessment & Plan:   Problem List Items Addressed This Visit    Acquired hypothyroidism    Thyroid function is WNL on current dose.  No current changes needed.   Lab Results  Component Value Date   TSH 1.41 06/03/2017         Degenerative cervical spinal stenosis    Severe by recent MRI  .  Asymptomatic currently, but her risk of paralysis if she falls is increased.  Referral to Neurosurgery       Relevant Orders   Ambulatory referral to Neurosurgery   Essential hypertension - Primary   Relevant Orders   Comp Met (CMET)   Ambulatory referral to Neurosurgery    A total of 25 minutes of face to face time was spent with patient more than half of which was spent in counselling about the above mentioned conditions  and coordination of care   I have discontinued Anavictoria Langham's meloxicam. I am also having her start on meloxicam. Additionally, I am having her maintain her Multiple Vitamins-Minerals (PRESERVISION AREDS 2 PO), Polyethyl Glycol-Propyl Glycol (SYSTANE OP), Multiple Vitamins-Minerals (CENTRUM SILVER PO), Misc Natural Products (OSTEO BI-FLEX JOINT SHIELD PO), fluticasone, latanoprost, levothyroxine, amLODipine, pravastatin, pravastatin, and ibuprofen.  Meds ordered this encounter  Medications  . meloxicam (MOBIC) 7.5 MG tablet    Sig: Take 1 tablet (7.5 mg total) by mouth daily. As needed for joint pain    Dispense:  30 tablet    Refill:  5    Medications Discontinued During This Encounter  Medication Reason  . meloxicam (MOBIC) 15 MG tablet Error    Follow-up: No follow-ups on file.   Crecencio Mc, MD

## 2017-12-07 NOTE — Patient Instructions (Addendum)
You can use 7.5 mg of meloxicam daily for your joint pain  I sent a new prescription to your pharmacy for the lower  Dose  Cut the old tablets in half and use them up first    You can ADD up to 2000 mg of acetominophen (tylenol) every day safely  In divided doses (500 mg every 6 hours  Or 1000 mg every 12 hours.)    You have severe spinal stenosis affecting the spinal cord in the neck level.  I am referring you to Neurosurgeon to discuss the risks vs the benefits of 1) doing nothing  Or 2) having surgery

## 2017-12-10 DIAGNOSIS — M4802 Spinal stenosis, cervical region: Secondary | ICD-10-CM | POA: Insufficient documentation

## 2017-12-10 NOTE — Assessment & Plan Note (Signed)
Thyroid function is WNL on current dose.  No current changes needed.   Lab Results  Component Value Date   TSH 1.41 06/03/2017

## 2017-12-10 NOTE — Assessment & Plan Note (Signed)
Severe by recent MRI .  Asymptomatic currently, but her risk of paralysis if she falls is increased.  Referral to Neurosurgery

## 2017-12-13 ENCOUNTER — Ambulatory Visit: Payer: PPO

## 2017-12-16 ENCOUNTER — Ambulatory Visit: Payer: PPO

## 2017-12-21 ENCOUNTER — Ambulatory Visit (INDEPENDENT_AMBULATORY_CARE_PROVIDER_SITE_OTHER): Payer: PPO

## 2017-12-21 VITALS — BP 118/72 | HR 77 | Temp 97.8°F | Resp 14 | Ht 58.75 in | Wt 108.8 lb

## 2017-12-21 DIAGNOSIS — I10 Essential (primary) hypertension: Secondary | ICD-10-CM

## 2017-12-21 DIAGNOSIS — R7301 Impaired fasting glucose: Secondary | ICD-10-CM

## 2017-12-21 DIAGNOSIS — Z0000 Encounter for general adult medical examination without abnormal findings: Secondary | ICD-10-CM

## 2017-12-21 DIAGNOSIS — E039 Hypothyroidism, unspecified: Secondary | ICD-10-CM

## 2017-12-21 NOTE — Progress Notes (Addendum)
Subjective:   Gloria Carr is a 84 y.o. female who presents for Medicare Annual (Subsequent) preventive examination.  Review of Systems:  No ROS.  Medicare Wellness Visit. Additional risk factors are reflected in the social history. Cardiac Risk Factors include: advanced age (>47mn, >>74women);hypertension     Objective:     Vitals: BP 118/72 (BP Location: Left Arm, Patient Position: Sitting, Cuff Size: Normal)   Pulse 77   Temp 97.8 F (36.6 C) (Oral)   Resp 14   Ht 4' 10.75" (1.492 m)   Wt 108 lb 12.8 oz (49.4 kg)   LMP  (LMP Unknown)   SpO2 98%   BMI 22.16 kg/m   Body mass index is 22.16 kg/m.  Advanced Directives 12/21/2017 12/09/2016 12/10/2015 12/10/2014  Does Patient Have a Medical Advance Directive? Yes Yes Yes Yes  Type of AParamed[place]of [place]all PondLiving will [place]Port Jefferson StationLiving will Living will;Healthcare Power of [place]Living will  Does patient want to make changes to medical advance directive? No - Patient declined No - Patient declined - -  Copy of [place]in Chart? Yes - validated most recent copy scanned in chart (See row information) No - copy requested No - copy requested No - copy requested    Tobacco Social History   Tobacco Use  Smoking Status Former Smoker  Smokeless Tobacco Never Used     Counseling given: Not Answered   Clinical Intake:  Pre-visit preparation completed: Yes  Pain : No/denies pain     Nutritional Status: BMI of 19-24  Normal Diabetes: No  How often do you need to have someone help you when you read instructions, pamphlets, or other written materials from your doctor or pharmacy?: 1 - Never  Interpreter Needed?: No     Past Medical History:  Diagnosis Date  . Allergy   . Arthritis   . Glaucoma   . History of chickenpox   . History of recurrent UTIs   . Thyroid disease    Past Surgical History:  Procedure Laterality  Date  . BILATERAL CARPAL TUNNEL RELEASE  1999-2000  . CATARACT EXTRACTION, BILATERAL  2013   Family History  Problem Relation Age of Onset  . Stroke Mother   . Cancer Mother [id number]      colon  . Cancer Father        bone  . Cancer Son        Jaw  . Stroke Sister    Social History   Socioeconomic History  . Marital status: Widowed    Spouse name: Not on file  . Number of children: Not on file  . Years of education: Not on file  . Highest education level: Not on file  Occupational History  . Not on file  Social Needs  . Financial resource strain: Not hard at all  . Food insecurity:    Worry: Never true    Inability: Never true  . Transportation needs:    Medical: No    Non-medical: No  Tobacco Use  . Smoking status: Former SResearch scientist (life sciences) . Smokeless tobacco: Never Used  Substance and Sexual Activity  . Alcohol use: Yes    Alcohol/week: 5.0 standard drinks    Types: 5 Standard drinks or equivalent per week    Comment: with dinner  . Drug use: No  . Sexual activity: Never  Lifestyle  . Physical activity:    Days per week: 0 days  Minutes per session: 0 min  . Stress: Not on file  Relationships  . Social connections:    Talks on phone: Not on file    Gets together: Not on file    Attends religious service: Not on file    Active member of club or organization: Not on file    Attends meetings of clubs or organizations: Not on file    Relationship status: Not on file  Other Topics Concern  . Not on file  Social History Narrative  . Not on file    Outpatient Encounter Medications as of 12/21/2017  Medication Sig  . amLODipine (NORVASC) 2.5 MG tablet Take 1 tablet (2.5 mg total) by mouth daily. At night for blood pressure  . fluticasone (FLONASE) 50 MCG/ACT nasal spray Place into both nostrils daily. Reported on 01/17/2015  . ibuprofen (ADVIL,MOTRIN) 200 MG tablet Take 200 mg by mouth every 6 (six) hours as needed.  . latanoprost (XALATAN) 0.005 % ophthalmic solution  INSTILL 1 DROP IN THE LEFT EYE AT NIGHT  . levothyroxine (SYNTHROID, LEVOTHROID) 50 MCG tablet TAKE 1 TABLET BY MOUTH DAILY  . meloxicam (MOBIC) 7.5 MG tablet Take 1 tablet (7.5 mg total) by mouth daily. As needed for joint pain  . Misc Natural Products (OSTEO BI-FLEX JOINT SHIELD PO) Take by mouth.  . Multiple Vitamins-Minerals (CENTRUM SILVER PO) Take by mouth.  . Multiple Vitamins-Minerals (PRESERVISION AREDS 2 PO) Take by mouth.  Vladimir Faster Glycol-Propyl Glycol (SYSTANE OP) Apply to eye.  . pravastatin (PRAVACHOL) 20 MG tablet TAKE 1 TABLET(20 MG) BY MOUTH DAILY  . pravastatin (PRAVACHOL) 20 MG tablet TAKE 1 TABLET(20 MG) BY MOUTH DAILY   No facility-administered encounter medications on file as of 12/21/2017.     Activities of Daily Living In your present state of health, do you have any difficulty performing the following activities: 12/21/2017  Hearing? N  Vision? N  Difficulty concentrating or making decisions? N  Walking or climbing stairs? N  Dressing or bathing? N  Doing errands, shopping? N  Preparing Food and eating ? N  Using the Toilet? N  In the past six months, have you accidently leaked urine? Y  Comment Managed with daily brief  Do you have problems with loss of bowel control? Y  Comment Managed with daily brief  Managing your Medications? N  Managing your Finances? N  Housekeeping or managing your Housekeeping? N  Some recent data might be hidden    Patient Care Team: Crecencio Mc, MD as PCP - General (Internal Medicine)    Assessment:   This is a routine wellness examination for [place].  Requests new Neurosurgeon for consult.  Notes Dr. Aris Lot has relocated to [facility], per [facility].  New referral placed via coordinator to Dr. Deetta Perla. States pain in neck has improved.  No recent headaches.  TSH, CMET complete today.  Health Screenings  Mammogram-04/27/17 Colonoscopy-aged out Bone Density-12/22/15 Glaucoma-none reported Hearing-passes the  whisper test Hemoglobin-04/13/17. Results 15.0 Cholesterol -04/15/16. Results 161 Glucose-06/03/17. Results 115 Vision exam due 12/2018 Dental-every 6 months  Social  Alcohol intake-yes. 5 per week with meals Smoking history- none Smokers in home?none Illicit drug use?none Exercise-Tai Chi, dancing Diet-regular Sexually Active-never Multiple Partners-none  Safety  Patient feesl safe at home-yes Patient does have smoke detectors at home-yes Patient does wear sunscreen or protective clothing when in direct sunlight-yes Patient does wear seat belt when driving or riding with others-yes  Activities of Daily Living Patient can do their own household  chores. Denies needing assistance with: driving, feeding themselves, getting from bed to chair, getting to the toilet, bathing/showering, dressing, managing money, climbing flight of stairs, or preparing meals.   Depression Screen Patient denies losing interest in daily life, feeling hopeless, or crying easily over simple problems.   Fall Screen Patient denies being afraid of falling or falling in the last year.   Memory Screen Patient denies problems with memory, misplacing items, and is able to balance checkbook/bank accounts.  Patient is alert, normal appearance, oriented to person/place/and time. Correctly identified the president of the [place], recall of 3 objects, and performing simple calculations.  Patient displays appropriate judgement and can read correct time from watch face.   Immunizations The following Immunizations are up to date: Influenza, shingles, pneumonia, and tetanus.   Other Providers Patient Care Team: Crecencio Mc, MD as PCP - General (Internal Medicine)  Exercise Activities and Dietary recommendations Current Exercise Habits: Home exercise routine, Type of exercise: walking(Line dancing), Time (Minutes): 30, Frequency (Times/Week): 2, Weekly Exercise (Minutes/Week): 60, Intensity: Mild  Goals    . Increase  physical activity     Add aerobics as tolerated       Fall Risk Fall Risk  12/09/2016 12/10/2015 12/10/2014  Falls in the past year? Yes No Yes  Comment - - Tripped  Number falls in past yr: 1 - -  Injury with Fall? Yes - No  Follow up Falls prevention discussed;Education provided - -   Depression Screen PHQ 2/9 Scores 12/21/2017 12/09/2016 12/10/2015 12/10/2014  PHQ - 2 Score 0 0 0 0     Cognitive Function MMSE - Mini Mental State Exam 12/09/2016 12/10/2014  Orientation to time 5 5  Orientation to Place 5 5  Registration 3 3  Attention/ Calculation 5 5  Recall 2 3  Language- name 2 objects 2 2  Language- repeat 1 1  Language- follow 3 step command 3 3  Language- read & follow direction 1 1  Write a sentence 1 1  Copy design 1 1  Total score 29 30     6CIT Screen 12/21/2017 12/10/2015  What Year? 0 points 0 points  What month? 0 points 0 points  What time? 0 points 0 points  Count back from 20 0 points 0 points  Months in reverse 0 points 0 points  Repeat phrase 0 points 2 points  Total Score 0 2    Immunization History  Administered Date(s) Administered  . Influenza, High Dose Seasonal PF 10/16/2015, 10/31/2017  . Influenza-Unspecified 09/12/2014, 09/27/2016  . Pneumococcal Conjugate-13 01/17/2015  . Pneumococcal Polysaccharide-23 01/17/2011  . Td 12/11/2015  . Tdap 12/11/2015, 12/11/2015  . Zoster 07/15/2012   Screening Tests Health Maintenance  Topic Date Due  . TETANUS/TDAP  12/10/2025  . INFLUENZA VACCINE  Completed  . DEXA SCAN  Completed  . PNA vac Low Risk Adult  Completed      Plan:    End of life planning; Advance aging; Advanced directives discussed. Copy of current HCPOA/Living Will on file.    I have personally reviewed and noted the following in the patient's chart:   . Medical and social history . Use of alcohol, tobacco or illicit drugs  . Current medications and supplements . Functional ability and status . Nutritional  status . Physical activity . Advanced directives . List of other physicians . Hospitalizations, surgeries, and ER visits in previous 12 months . Vitals . Screenings to include cognitive, depression, and falls . Referrals and appointments  In addition, I have reviewed and discussed with patient certain preventive protocols, quality metrics, and best practice recommendations. A written personalized care plan for preventive services as well as general preventive health recommendations were provided to patient.     OBrien-Blaney, Daine Gunther L, LPN  49/20/1007    I have reviewed the above information and agree with above.   Deborra Medina, MD

## 2017-12-21 NOTE — Patient Instructions (Addendum)
  Gloria Carr , Thank you for taking time to come for your Medicare Wellness Visit. I appreciate your ongoing commitment to your health goals. Please review the following plan we discussed and let me know if I can assist you in the future.   Neurosurgeon referral; follow as directed.   Keep all routine schedule appointments.   These are the goals we discussed: Goals    . Increase physical activity     Add aerobics as tolerated       This is a list of the screening recommended for you and due dates:  Health Maintenance  Topic Date Due  . Tetanus Vaccine  12/10/2025  . Flu Shot  Completed  . DEXA scan (bone density measurement)  Completed  . Pneumonia vaccines  Completed

## 2017-12-22 LAB — COMPREHENSIVE METABOLIC PANEL
ALT: 16 U/L (ref 0–35)
AST: 19 U/L (ref 0–37)
Albumin: 4 g/dL (ref 3.5–5.2)
Alkaline Phosphatase: 50 U/L (ref 39–117)
BUN: 13 mg/dL (ref 6–23)
CALCIUM: 9.4 mg/dL (ref 8.4–10.5)
CHLORIDE: 104 meq/L (ref 96–112)
CO2: 32 meq/L (ref 19–32)
CREATININE: 0.82 mg/dL (ref 0.40–1.20)
GFR: 69.82 mL/min (ref >60.00)
Glucose, Bld: 167 mg/dL — ABNORMAL HIGH (ref 70–99)
POTASSIUM: 4 meq/L (ref 3.5–5.1)
SODIUM: 140 meq/L (ref 135–145)
Total Bilirubin: 0.5 mg/dL (ref 0.2–1.2)
Total Protein: 6.7 g/dL (ref 6.0–8.3)

## 2017-12-22 LAB — TSH: TSH: 1.44 u[IU]/mL (ref 0.35–4.50)

## 2017-12-23 NOTE — Addendum Note (Signed)
Addended by: Crecencio Mc on: 12/23/2017 01:14 PM   Modules accepted: Orders

## 2017-12-26 ENCOUNTER — Encounter: Payer: Self-pay | Admitting: *Deleted

## 2018-01-06 ENCOUNTER — Other Ambulatory Visit: Payer: Self-pay | Admitting: Internal Medicine

## 2018-01-12 ENCOUNTER — Encounter: Payer: Self-pay | Admitting: *Deleted

## 2018-01-17 DIAGNOSIS — M47812 Spondylosis without myelopathy or radiculopathy, cervical region: Secondary | ICD-10-CM | POA: Diagnosis not present

## 2018-01-17 DIAGNOSIS — M4802 Spinal stenosis, cervical region: Secondary | ICD-10-CM | POA: Diagnosis not present

## 2018-01-20 NOTE — Telephone Encounter (Signed)
Mailed letter to patient

## 2018-01-26 ENCOUNTER — Other Ambulatory Visit (INDEPENDENT_AMBULATORY_CARE_PROVIDER_SITE_OTHER): Payer: PPO

## 2018-01-26 DIAGNOSIS — R7301 Impaired fasting glucose: Secondary | ICD-10-CM | POA: Diagnosis not present

## 2018-01-26 LAB — HEMOGLOBIN A1C: Hgb A1c MFr Bld: 5.9 % (ref 4.6–6.5)

## 2018-01-29 ENCOUNTER — Encounter: Payer: Self-pay | Admitting: Internal Medicine

## 2018-01-29 DIAGNOSIS — R7303 Prediabetes: Secondary | ICD-10-CM | POA: Insufficient documentation

## 2018-02-19 ENCOUNTER — Other Ambulatory Visit: Payer: Self-pay | Admitting: Internal Medicine

## 2018-02-21 ENCOUNTER — Emergency Department: Payer: PPO

## 2018-02-21 ENCOUNTER — Emergency Department
Admission: EM | Admit: 2018-02-21 | Discharge: 2018-02-21 | Disposition: A | Discharge status: Home / Self Care | Payer: PPO | Attending: Emergency Medicine | Admitting: Emergency Medicine

## 2018-02-21 ENCOUNTER — Other Ambulatory Visit: Payer: Self-pay

## 2018-02-21 ENCOUNTER — Encounter: Payer: Self-pay | Admitting: Emergency Medicine

## 2018-02-21 DIAGNOSIS — Z87891 Personal history of nicotine dependence: Secondary | ICD-10-CM | POA: Diagnosis not present

## 2018-02-21 DIAGNOSIS — R079 Chest pain, unspecified: Secondary | ICD-10-CM | POA: Diagnosis not present

## 2018-02-21 DIAGNOSIS — R1013 Epigastric pain: Secondary | ICD-10-CM | POA: Diagnosis not present

## 2018-02-21 DIAGNOSIS — Z79899 Other long term (current) drug therapy: Secondary | ICD-10-CM | POA: Diagnosis not present

## 2018-02-21 DIAGNOSIS — K449 Diaphragmatic hernia without obstruction or gangrene: Secondary | ICD-10-CM | POA: Diagnosis not present

## 2018-02-21 DIAGNOSIS — I10 Essential (primary) hypertension: Secondary | ICD-10-CM | POA: Diagnosis not present

## 2018-02-21 DIAGNOSIS — K5730 Diverticulosis of large intestine without perforation or abscess without bleeding: Secondary | ICD-10-CM | POA: Diagnosis not present

## 2018-02-21 HISTORY — DX: Essential (primary) hypertension: I10

## 2018-02-21 LAB — CBC
HEMATOCRIT: 45 % (ref 36.0–46.0)
Hemoglobin: 14.6 g/dL (ref 12.0–15.0)
MCH: 30.3 pg (ref 26.0–34.0)
MCHC: 32.4 g/dL (ref 30.0–36.0)
MCV: 93.4 fL (ref 80.0–100.0)
PLATELETS: 279 10*3/uL (ref 150–400)
RBC: 4.82 MIL/uL (ref 3.87–5.11)
RDW: 12.4 % (ref 11.5–15.5)
WBC: 7.3 10*3/uL (ref 4.0–10.5)
nRBC: 0 % (ref 0.0–0.2)

## 2018-02-21 LAB — BASIC METABOLIC PANEL
Anion gap: 6 (ref 5–15)
BUN: 15 mg/dL (ref 8–23)
CHLORIDE: 107 mmol/L (ref 98–111)
CO2: 26 mmol/L (ref 22–32)
Calcium: 9.1 mg/dL (ref 8.9–10.3)
Creatinine, Ser: 0.59 mg/dL (ref 0.44–1.00)
GFR calc Af Amer: >60 mL/min (ref >60)
Glucose, Bld: 123 mg/dL — ABNORMAL HIGH (ref 70–99)
Potassium: 4 mmol/L (ref 3.5–5.1)
SODIUM: 139 mmol/L (ref 135–145)

## 2018-02-21 LAB — HEPATIC FUNCTION PANEL
ALBUMIN: 3.9 g/dL (ref 3.5–5.0)
ALT: 15 U/L (ref 0–44)
AST: 24 U/L (ref 15–41)
Alkaline Phosphatase: 48 U/L (ref 38–126)
BILIRUBIN DIRECT: 0.1 mg/dL (ref 0.0–0.2)
BILIRUBIN TOTAL: 0.6 mg/dL (ref 0.3–1.2)
Indirect Bilirubin: 0.5 mg/dL (ref 0.3–0.9)
Total Protein: 6.5 g/dL (ref 6.5–8.1)

## 2018-02-21 LAB — TROPONIN I: Troponin I: <0.03 ng/mL (ref <0.03)

## 2018-02-21 LAB — LIPASE, BLOOD: Lipase: 34 U/L (ref 11–51)

## 2018-02-21 MED ORDER — ALUM & MAG HYDROXIDE-SIMETH 200-200-20 MG/5ML PO SUSP
30.0000 mL | Freq: Once | ORAL | Status: AC
  Administered 2018-02-21: 30 mL via ORAL
  Filled 2018-02-21: qty 30

## 2018-02-21 MED ORDER — IOHEXOL 300 MG/ML  SOLN
75.0000 mL | Freq: Once | INTRAMUSCULAR | Status: AC | PRN
  Administered 2018-02-21: 75 mL via INTRAVENOUS

## 2018-02-21 MED ORDER — IOPAMIDOL (ISOVUE-300) INJECTION 61%
15.0000 mL | INTRAVENOUS | Status: AC
  Administered 2018-02-21: 15 mL via ORAL

## 2018-02-21 MED ORDER — SODIUM CHLORIDE 0.9% FLUSH: 3.0000 mL | Freq: Once | INTRAVENOUS | Status: DC

## 2018-02-21 NOTE — Discharge Instructions (Addendum)
Be sure to take your pills with food, if you do not feel that you need to take the meloxicam, do not take it.  Follow close with primary care doctor tomorrow.  If you have chest pain shortness of breath or you feel worse in any way return to the emergency department.

## 2018-02-21 NOTE — ED Notes (Signed)
Patient back from CT.

## 2018-02-21 NOTE — ED Triage Notes (Signed)
PT c/o CP described as indigestion xfew days. Denies any other symptoms. VSS, NAD noted

## 2018-02-21 NOTE — ED Notes (Signed)
Patient unhooked to use restroom and helped back to bed. Pt independent ambulating

## 2018-02-21 NOTE — ED Notes (Signed)
Patient transported to X-ray 

## 2018-02-21 NOTE — ED Provider Notes (Addendum)
[facility] Emergency Department Provider Note  ____________________________________________   I have reviewed the triage vital signs and the nursing notes. Where available I have reviewed prior notes and, if possible and indicated, outside hospital notes.    HISTORY  Chief Complaint Chest Pain    HPI Gloria Carr is a 85 y.o. female with a history of glaucoma thyroid disease, presents today with epigastric abdominal discomfort which is worse when she swallows her pills at night.  That is when it really bothers her.  When she lies down after swallowing her pills.  Is not exertional she is not more short of breath than normal, she denies any fever or chills, she actually does not have chest pain, when asked about chest pain, she states she does but then she points to her epigastric region.  Is clearly infradiaphragmatic as she declines describes it.  She is not had any vomiting.  She has had some loose stools.  She states she has taken some Imodium.  She does not have any lower abdominal pain.  She not have any fever or chills.  Has not had any hematemesis or emesis.  Dates is a little uncomfortable to swallow food when he gets to her stomach however.  No history of gallbladder disease.  She actually has no chest pain, and no shortness of breath no pleuritic chest pain no personal family history of PE or DVT no recent travel no lower extremity swelling, she is having slight discomfort at this time which would be where she states that she took her medication    Past Medical History:  Diagnosis Date  . Allergy   . Arthritis   . Glaucoma   . History of chickenpox   . History of recurrent UTIs   . Thyroid disease     Patient Active Problem List   Diagnosis Date Noted  . Prediabetes 01/29/2018  . Degenerative cervical spinal stenosis 12/10/2017  . Diarrhea in adult patient 04/14/2017  . Pain of right hip joint pain 04/14/2017  . Lumbar spinal stenosis  12/15/2016  . Essential hypertension 11/27/2016  . Closed fracture of left superior pubic ramus, with routine healing, subsequent encounter 08/24/2016  . Pure hypercholesterolemia 04/15/2016  . Vitamin D deficiency 04/15/2016  . Osteoporosis 12/25/2015  . Edema 10/18/2015  . Visit for preventive health examination 01/19/2015  . Acquired hypothyroidism 07/16/2014  . Hereditary and idiopathic peripheral neuropathy 07/16/2014    Past Surgical History:  Procedure Laterality Date  . BILATERAL CARPAL TUNNEL RELEASE  1999-2000  . CATARACT EXTRACTION, BILATERAL  2013    Prior to Admission medications   Medication Sig Start Date End Date Taking? Authorizing Provider  amLODipine (NORVASC) 2.5 MG tablet Take 1 tablet (2.5 mg total) by mouth daily. At night for blood pressure 04/13/17   Crecencio Mc, MD  fluticasone (FLONASE) 50 MCG/ACT nasal spray Place into both nostrils daily. Reported on 01/17/2015    [provider]  ibuprofen (ADVIL,MOTRIN) 200 MG tablet Take 200 mg by mouth every 6 (six) hours as needed.    [provider]  latanoprost (XALATAN) 0.005 % ophthalmic solution INSTILL 1 DROP IN THE LEFT EYE AT NIGHT 07/08/14   [provider]  levothyroxine (SYNTHROID, LEVOTHROID) 50 MCG tablet TAKE 1 TABLET BY MOUTH DAILY 01/09/18   Crecencio Mc, MD  meloxicam (MOBIC) 7.5 MG tablet Take 1 tablet (7.5 mg total) by mouth daily. As needed for joint pain 12/07/17   Crecencio Mc, MD  Misc  Natural Products (OSTEO BI-FLEX JOINT SHIELD PO) Take by mouth.    [provider]  Multiple Vitamins-Minerals (CENTRUM SILVER PO) Take by mouth.    [provider]  Multiple Vitamins-Minerals (PRESERVISION AREDS 2 PO) Take by mouth.    [provider]  Polyethyl Glycol-Propyl Glycol (SYSTANE OP) Apply to eye.    [provider]  pravastatin (PRAVACHOL) 20 MG tablet TAKE 1 TABLET(20 MG) BY MOUTH DAILY 08/15/17   Crecencio Mc, MD  pravastatin  (PRAVACHOL) 20 MG tablet TAKE 1 TABLET(20 MG) BY MOUTH DAILY 02/20/18   Crecencio Mc, MD    Allergies Sulfa antibiotics  Family History  Problem Relation Age of Onset  . Stroke Mother   . Cancer Mother 10       colon  . Cancer Father        bone  . Cancer Son        Jaw  . Stroke Sister     Social History Social History   Tobacco Use  . Smoking status: Former Research scientist (life sciences)  . Smokeless tobacco: Never Used  Substance Use Topics  . Alcohol use: Yes    Alcohol/week: 5.0 standard drinks    Types: 5 Standard drinks or equivalent per week    Comment: with dinner  . Drug use: No    Review of Systems Constitutional: No fever/chills Eyes: No visual changes. ENT: No sore throat. No stiff neck no neck pain Cardiovascular: Denies chest pain. Respiratory: Denies shortness of breath. Gastrointestinal:   no vomiting.  No diarrhea.  No constipation. Genitourinary: Negative for dysuria. Musculoskeletal: Negative lower extremity swelling Skin: Negative for rash. Neurological: Negative for severe headaches, focal weakness or numbness.   ____________________________________________   PHYSICAL EXAM:  VITAL SIGNS: ED Triage Vitals [02/21/18 1111]  Enc Vitals Group     BP (!) 157/82     Pulse Rate 75     Resp 16     Temp 98.3 F (36.8 C)     Temp Source Oral     SpO2 99 %     Weight 105 lb (47.6 kg)     Height 4\' 11"  (1.499 m)     Head Circumference      Peak Flow      Pain Score 2     Pain Loc      Pain Edu?      Excl. in [place]?     Constitutional: Alert and oriented. Well appearing and in no acute distress. Eyes: Conjunctivae are normal Head: Atraumatic HEENT: No congestion/rhinnorhea. Mucous membranes are moist.  Oropharynx non-erythematous Neck:   Nontender with no meningismus, no masses, no stridor Cardiovascular: Normal rate, regular rhythm. Grossly normal heart sounds.  Good peripheral circulation. Respiratory: Normal respiratory effort.  No retractions. Lungs  CTAB. Abdominal: Soft and epigastric discomfort which reproduces her pain, no evidence of acute abdomen. No distention. No guarding no rebound Back:  There is no focal tenderness or step off.  there is no midline tenderness there are no lesions noted. there is no CVA tenderness Musculoskeletal: No lower extremity tenderness, no upper extremity tenderness. No joint effusions, no DVT signs strong distal pulses no edema Neurologic:  Normal speech and language. No gross focal neurologic deficits are appreciated.  Skin:  Skin is warm, dry and intact. No rash noted. Psychiatric: Mood and affect are normal. Speech and behavior are normal.  ____________________________________________   LABS (all labs ordered are listed, but only abnormal results are displayed)  Labs Reviewed  BASIC  METABOLIC PANEL - Abnormal; Notable for the following components:      Result Value   Glucose, Bld 123 (*)    All other components within normal limits  CBC  TROPONIN I  HEPATIC FUNCTION PANEL  LIPASE, BLOOD    Pertinent labs  results that were available during my care of the patient were reviewed by me and considered in my medical decision making (see chart for details). ____________________________________________  EKG  I personally interpreted any EKGs ordered by me or triage Sinus rhythm rate 70 bpm, normal axis, no acute ST elevation depression nonspecific ST changes ____________________________________________  RADIOLOGY  Pertinent labs & imaging results that were available during my care of the patient were reviewed by me and considered in my medical decision making (see chart for details). If possible, patient and/or family made aware of any abnormal findings.  Dg Chest 2 View  Result Date: 02/21/2018 CLINICAL DATA:  Chest pain. EXAM: CHEST - 2 VIEW COMPARISON:  None. FINDINGS: The heart size and pulmonary vascularity are normal. Aortic atherosclerosis. The lungs appear somewhat hyperinflated but  there are no infiltrates or effusions. No acute bone abnormality. Multiple calcified loose bodies in the right glenohumeral joint. Arthritic changes of the right AC joint. IMPRESSION: No acute abnormalities. Aortic Atherosclerosis (ICD10-I70.0). Electronically Signed   By: Lorriane Shire M.D.   On: 02/21/2018 12:16   ____________________________________________    PROCEDURES  Procedure(s) performed: None  Procedures  Critical Care performed: None  ____________________________________________   INITIAL IMPRESSION / ASSESSMENT AND PLAN / ED COURSE  Pertinent labs & imaging results that were available during my care of the patient were reviewed by me and considered in my medical decision making (see chart for details).  Patient with epigastralgia in the context of taking her pills and eating.  Abdomen is benign.  She is quite slender I do palpate her aorta but does not appear to large.  I will obtain CT scan to further evaluate abdominal pain in a patient this age.  Low suspicion for ACS PE dissection myocarditis endocarditis pneumonia pneumothorax or anterior intrathoracic pathology, her chest x-ray and initial troponin are reassuring despite symptoms for 1 week  ----------------------------------------- 5:54 PM on 02/21/2018 -----------------------------------------  At this time, there does not appear to be clinical evidence to support the diagnosis of pulmonary embolus, dissection, myocarditis, endocarditis, pericarditis, pericardial tamponade, acute coronary syndrome, pneumothorax, pneumonia, or any other acute intrathoracic pathology that will require admission or acute intervention. Nor is there evidence of any significant intra-abdominal pathology causing this discomfort.  Considering the patient's symptoms, medical history, and physical examination today, I have low suspicion for cholecystitis or biliary pathology, pancreatitis, perforation or bowel obstruction, hernia,  intra-abdominal abscess, AAA or dissection, volvulus or intussusception, mesenteric ischemia, ischemic gut, pyelonephritis or appendicitis.  No evidence of acute pathology today she is eating and drinking well and would like to go home.  We discussed admission but she refuses.    ____________________________________________   FINAL CLINICAL IMPRESSION(S) / ED DIAGNOSES  Final diagnoses:  None      This chart was dictated using voice recognition software.  Despite best efforts to proofread,  errors can occur which can change meaning.      Schuyler Amor, MD 02/21/18 1240    Schuyler Amor, MD 02/21/18 1754

## 2018-02-21 NOTE — ED Notes (Signed)
Pt given first oral contrast to drink at this time. Second contrast at bedside.

## 2018-02-21 NOTE — ED Notes (Signed)
Ct made aware pt is done with contrast

## 2018-02-24 ENCOUNTER — Encounter: Payer: Self-pay | Admitting: Internal Medicine

## 2018-02-24 ENCOUNTER — Ambulatory Visit (INDEPENDENT_AMBULATORY_CARE_PROVIDER_SITE_OTHER): Payer: PPO | Admitting: Internal Medicine

## 2018-02-24 VITALS — BP 112/64 | HR 83 | Temp 98.1°F | Ht 59.0 in | Wt 106.1 lb

## 2018-02-24 DIAGNOSIS — K219 Gastro-esophageal reflux disease without esophagitis: Secondary | ICD-10-CM | POA: Diagnosis not present

## 2018-02-24 DIAGNOSIS — R079 Chest pain, unspecified: Secondary | ICD-10-CM | POA: Diagnosis not present

## 2018-02-24 DIAGNOSIS — J309 Allergic rhinitis, unspecified: Secondary | ICD-10-CM

## 2018-02-24 MED ORDER — FAMOTIDINE 20 MG PO TABS: 20.0000 mg | ORAL_TABLET | Freq: Every day | ORAL | 3 refills | Status: DC

## 2018-02-24 NOTE — Patient Instructions (Addendum)
Vitamin D3 2000 to 4000 daily  Calcium 600 mg 2x per day   Try to take supplement 4 hrs after thyroid medication   Try Claritin, zyrtec or allegra at night for allergies 1/2 pill daily   Tylenol is safer for pain 500 mg up to 6 x per day   pepcid [facility]  Cardiology address  [address]      [address]   [place] [address]             Gastroesophageal Reflux Disease, Adult Gastroesophageal reflux (GER) happens when acid from the stomach flows up into the tube that connects the mouth and the stomach (esophagus). Normally, food travels down the esophagus and stays in the stomach to be digested. However, when a person has GER, food and stomach acid sometimes move back up into the esophagus. If this becomes a more serious problem, the person may be diagnosed with a disease called gastroesophageal reflux disease (GERD). GERD occurs when the reflux:  Happens often.  Causes frequent or severe symptoms.  Causes problems such as damage to the esophagus. When stomach acid comes in contact with the esophagus, the acid may cause soreness (inflammation) in the esophagus. Over time, GERD may create small holes (ulcers) in the lining of the esophagus. What are the causes? This condition is caused by a problem with the muscle between the esophagus and the stomach (lower esophageal sphincter, or LES). Normally, the LES muscle closes after food passes through the esophagus to the stomach. When the LES is weakened or abnormal, it does not close properly, and that allows food and stomach acid to go back up into the esophagus. The LES can be weakened by certain dietary substances, medicines, and medical conditions, including:  Tobacco use.  Pregnancy.  Having a hiatal hernia.  Alcohol use.  Certain foods and beverages, such as coffee, chocolate, onions, and peppermint. What increases the risk? You are more likely to develop this condition if you:  Have an increased body weight.  Have a  connective tissue disorder.  Use NSAID medicines. What are the signs or symptoms? Symptoms of this condition include:  Heartburn.  Difficult or painful swallowing.  The feeling of having a lump in the throat.  Abitter taste in the mouth.  Bad breath.  Having a large amount of saliva.  Having an upset or bloated stomach.  Belching.  Chest pain. Different conditions can cause chest pain. Make sure you see your health care provider if you experience chest pain.  Shortness of breath or wheezing.  Ongoing (chronic) cough or a night-time cough.  Wearing away of tooth enamel.  Weight loss. How is this diagnosed? Your health care provider will take a medical history and perform a physical exam. To determine if you have mild or severe GERD, your health care provider may also monitor how you respond to treatment. You may also have tests, including:  A test to examine your stomach and esophagus with a small camera (endoscopy).  A test thatmeasures the acidity level in your esophagus.  A test thatmeasures how much pressure is on your esophagus.  A barium swallow or modified barium swallow test to show the shape, size, and functioning of your esophagus. How is this treated? The goal of treatment is to help relieve your symptoms and to prevent complications. Treatment for this condition may vary depending on how severe your symptoms are. Your health care provider may recommend:  Changes to your diet.  Medicine.  Surgery. Follow  these instructions at home: Eating and drinking   Follow a diet as recommended by your health care provider. This may involve avoiding foods and drinks such as: ? Coffee and tea (with or without caffeine). ? Drinks that containalcohol. ? Energy drinks and sports drinks. ? Carbonated drinks or sodas. ? Chocolate and cocoa. ? Peppermint and mint flavorings. ? Garlic and onions. ? Horseradish. ? Spicy and acidic foods, including peppers, chili  powder, curry powder, vinegar, hot sauces, and barbecue sauce. ? Citrus fruit juices and citrus fruits, such as oranges, lemons, and limes. ? Tomato-based foods, such as red sauce, chili, salsa, and pizza with red sauce. ? Fried and fatty foods, such as donuts, french fries, potato chips, and high-fat dressings. ? High-fat meats, such as hot dogs and fatty cuts of red and white meats, such as rib eye steak, sausage, ham, and bacon. ? High-fat dairy items, such as whole milk, butter, and cream cheese.  Eat small, frequent meals instead of large meals.  Avoid drinking large amounts of liquid with your meals.  Avoid eating meals during the 2-3 hours before bedtime.  Avoid lying down right after you eat.  Do not exercise right after you eat. Lifestyle   Do not use any products that contain nicotine or tobacco, such as cigarettes, e-cigarettes, and chewing tobacco. If you need help quitting, ask your health care provider.  Try to reduce your stress by using methods such as yoga or meditation. If you need help reducing stress, ask your health care provider.  If you are overweight, reduce your weight to an amount that is healthy for you. Ask your health care provider for guidance about a safe weight loss goal. General instructions  Pay attention to any changes in your symptoms.  Take over-the-counter and prescription medicines only as told by your health care provider. Do not take aspirin, ibuprofen, or other NSAIDs unless your health care provider told you to do so.  Wear loose-fitting clothing. Do not wear anything tight around your waist that causes pressure on your abdomen.  Raise (elevate) the head of your bed about 6 inches (15 cm).  Avoid bending over if this makes your symptoms worse.  Keep all follow-up visits as told by your health care provider. This is important. Contact a health care provider if:  You have: ? New symptoms. ? Unexplained weight loss. ? Difficulty  swallowing or it hurts to swallow. ? Wheezing or a persistent cough. ? A hoarse voice.  Your symptoms do not improve with treatment. Get help right away if you:  Have pain in your arms, neck, jaw, teeth, or back.  Feel sweaty, dizzy, or light-headed.  Have chest pain or shortness of breath.  Vomit and your vomit looks like blood or coffee grounds.  Faint.  Have stool that is bloody or black.  Cannot swallow, drink, or eat. Summary  Gastroesophageal reflux happens when acid from the stomach flows up into the esophagus. GERD is a disease in which the reflux happens often, causes frequent or severe symptoms, or causes problems such as damage to the esophagus.  Treatment for this condition may vary depending on how severe your symptoms are. Your health care provider may recommend diet and lifestyle changes, medicine, or surgery.  Contact a health care provider if you have new or worsening symptoms.  Take over-the-counter and prescription medicines only as told by your health care provider. Do not take aspirin, ibuprofen, or other NSAIDs unless your health care provider told you to  do so.  Keep all follow-up visits as told by your health care provider. This is important. This information is not intended to replace advice given to you by your health care provider. Make sure you discuss any questions you have with your health care provider. Document Released: 10/07/2004 Document Revised: 07/06/2017 Document Reviewed: 07/06/2017 Elsevier Interactive Patient Education  2019 [employer].    Allergic Rhinitis, Adult Allergic rhinitis is an allergic reaction that affects the mucous membrane inside the nose. It causes sneezing, a runny or stuffy nose, and the feeling of mucus going down the back of the throat (postnasal drip). Allergic rhinitis can be mild to severe. There are two types of allergic rhinitis:  Seasonal. This type is also called hay fever. It happens only during certain  seasons.  Perennial. This type can happen at any time of the year. What are the causes? This condition happens when the body's defense system (immune system) responds to certain harmless substances called allergens as though they were germs.  Seasonal allergic rhinitis is triggered by pollen, which can come from grasses, trees, and weeds. Perennial allergic rhinitis may be caused by:  House dust mites.  Pet dander.  Mold spores. What are the signs or symptoms? Symptoms of this condition include:  Sneezing.  Runny or stuffy nose (nasal congestion).  Postnasal drip.  Itchy nose.  Tearing of the eyes.  Trouble sleeping.  Daytime sleepiness. How is this diagnosed? This condition may be diagnosed based on:  Your medical history.  A physical exam.  Tests to check for related conditions, such as: ? Asthma. ? Pink eye. ? Ear infection. ? Upper respiratory infection.  Tests to find out which allergens trigger your symptoms. These may include skin or blood tests. How is this treated? There is no cure for this condition, but treatment can help control symptoms. Treatment may include:  Taking medicines that block allergy symptoms, such as antihistamines. Medicine may be given as a shot, nasal spray, or pill.  Avoiding the allergen.  Desensitization. This treatment involves getting ongoing shots until your body becomes less sensitive to the allergen. This treatment may be done if other treatments do not help.  If taking medicine and avoiding the allergen does not work, new, stronger medicines may be prescribed. Follow these instructions at home:  Find out what you are allergic to. Common allergens include smoke, dust, and pollen.  Avoid the things you are allergic to. These are some things you can do to help avoid allergens: ? Replace carpet with wood, tile, or vinyl flooring. Carpet can trap dander and dust. ? Do not smoke. Do not allow smoking in your home. ? Change  your heating and air conditioning filter at least once a month. ? During allergy season:  Keep windows closed as much as possible.  Plan outdoor activities when pollen counts are lowest. This is usually during the evening hours.  When coming indoors, change clothing and shower before sitting on furniture or bedding.  Take over-the-counter and prescription medicines only as told by your health care provider.  Keep all follow-up visits as told by your health care provider. This is important. Contact a health care provider if:  You have a fever.  You develop a persistent cough.  You make whistling sounds when you breathe (you wheeze).  Your symptoms interfere with your normal daily activities. Get help right away if:  You have shortness of breath. Summary  This condition can be managed by taking medicines as directed and  avoiding allergens.  Contact your health care provider if you develop a persistent cough or fever.  During allergy season, keep windows closed as much as possible. This information is not intended to replace advice given to you by your health care provider. Make sure you discuss any questions you have with your health care provider. Document Released: 09/22/2000 Document Revised: 02/05/2016 Document Reviewed: 02/05/2016 Elsevier Interactive Patient Education  2019 [employer] for Gastroesophageal Reflux Disease, Adult When you have gastroesophageal reflux disease (GERD), the foods you eat and your eating habits are very important. Choosing the right foods can help ease the discomfort of GERD. Consider working with a diet and nutrition specialist (dietitian) to help you make healthy food choices. What general guidelines should I follow?  Eating plan  Choose healthy foods low in fat, such as fruits, vegetables, whole grains, low-fat dairy products, and lean meat, fish, and poultry.  Eat frequent, small meals instead of three large meals each  day. Eat your meals slowly, in a relaxed setting. Avoid bending over or lying down until 2-3 hours after eating.  Limit high-fat foods such as fatty meats or fried foods.  Limit your intake of oils, butter, and shortening to less than 8 teaspoons each day.  Avoid the following: ? Foods that cause symptoms. These may be different for different people. Keep a food diary to keep track of foods that cause symptoms. ? Alcohol. ? Drinking large amounts of liquid with meals. ? Eating meals during the 2-3 hours before bed.  Cook foods using methods other than frying. This may include baking, grilling, or broiling. Lifestyle  Maintain a healthy weight. Ask your health care provider what weight is healthy for you. If you need to lose weight, work with your health care provider to do so safely.  Exercise for at least 30 minutes on 5 or more days each week, or as told by your health care provider.  Avoid wearing clothes that fit tightly around your waist and chest.  Do not use any products that contain nicotine or tobacco, such as cigarettes and e-cigarettes. If you need help quitting, ask your health care provider.  Sleep with the head of your bed raised. Use a wedge under the mattress or blocks under the bed frame to raise the head of the bed. What foods are not recommended? The items listed may not be a complete list. Talk with your dietitian about what dietary choices are best for you. Grains Pastries or quick breads with added fat. [place] toast. Vegetables Deep fried vegetables. [place] fries. Any vegetables prepared with added fat. Any vegetables that cause symptoms. For some people this may include tomatoes and tomato products, chili peppers, onions and garlic, and horseradish. Fruits Any fruits prepared with added fat. Any fruits that cause symptoms. For some people this may include citrus fruits, such as oranges, grapefruit, pineapple, and lemons. Meats and other protein foods High-fat  meats, such as fatty beef or pork, hot dogs, ribs, ham, sausage, salami and bacon. Fried meat or protein, including fried fish and fried chicken. Nuts and nut butters. Dairy Whole milk and chocolate milk. Sour cream. Cream. Ice cream. Cream cheese. Milk shakes. Beverages Coffee and tea, with or without caffeine. Carbonated beverages. Sodas. Energy drinks. Fruit juice made with acidic fruits (such as orange or grapefruit). Tomato juice. Alcoholic drinks. Fats and oils Butter. Margarine. Shortening. Ghee. Sweets and desserts Chocolate and cocoa. Donuts. Seasoning and other foods Pepper. Peppermint and spearmint. Any  condiments, herbs, or seasonings that cause symptoms. For some people, this may include curry, hot sauce, or vinegar-based salad dressings. Summary  When you have gastroesophageal reflux disease (GERD), food and lifestyle choices are very important to help ease the discomfort of GERD.  Eat frequent, small meals instead of three large meals each day. Eat your meals slowly, in a relaxed setting. Avoid bending over or lying down until 2-3 hours after eating.  Limit high-fat foods such as fatty meat or fried foods. This information is not intended to replace advice given to you by your health care provider. Make sure you discuss any questions you have with your health care provider. Document Released: 12/28/2004 Document Revised: 12/30/2015 Document Reviewed: 12/30/2015 Elsevier Interactive Patient Education  2019 [employer].

## 2018-02-24 NOTE — Progress Notes (Signed)
Chief Complaint  Patient presents with  . Follow-up   F/u  1. Chest pain went to ED 02/21/2018 CT ab done +fatty liver, small hiatal hernia, atherosclerosis of aorta trop negative x 2 CXR negative. She has been eating a lot of acidic fruit grapefruit and oranges and thinks this could trigger  2. C/o runny nose and PND on flonase clear drainage   Review of Systems  Constitutional: Negative for weight loss.  HENT: Negative for hearing loss.   Eyes: Negative for blurred vision.  Respiratory: Negative for shortness of breath.   Cardiovascular: Negative for chest pain.  Gastrointestinal: Positive for heartburn. Negative for abdominal pain.  Skin: Negative for rash.  Neurological: Negative for headaches.  Psychiatric/Behavioral: Negative for depression.   Past Medical History:  Diagnosis Date  . Allergy   . Arthritis   . Glaucoma   . History of chickenpox   . History of recurrent UTIs   . Hypertension   . Thyroid disease    Past Surgical History:  Procedure Laterality Date  . BILATERAL CARPAL TUNNEL RELEASE  1999-2000  . CATARACT EXTRACTION, BILATERAL  2013   Family History  Problem Relation Age of Onset  . Stroke Mother   . Cancer Mother 70       colon  . Cancer Father        bone  . Cancer Son        Jaw  . Stroke Sister    Social History   Socioeconomic History  . Marital status: Widowed    Spouse name: Not on file  . Number of children: Not on file  . Years of education: Not on file  . Highest education level: Not on file  Occupational History  . Not on file  Social Needs  . Financial resource strain: Not hard at all  . Food insecurity:    Worry: Never true    Inability: Never true  . Transportation needs:    Medical: No    Non-medical: No  Tobacco Use  . Smoking status: Former Research scientist (life sciences)  . Smokeless tobacco: Never Used  Substance and Sexual Activity  . Alcohol use: Yes    Alcohol/week: 5.0 standard drinks    Types: 5 Standard drinks or equivalent per  week    Comment: with dinner  . Drug use: No  . Sexual activity: Never  Lifestyle  . Physical activity:    Days per week: 0 days    Minutes per session: 0 min  . Stress: Not on file  Relationships  . Social connections:    Talks on phone: Not on file    Gets together: Not on file    Attends religious service: Not on file    Active member of club or organization: Not on file    Attends meetings of clubs or organizations: Not on file    Relationship status: Not on file  . Intimate partner violence:    Fear of current or ex partner: Not on file    Emotionally abused: Not on file    Physically abused: Not on file    Forced sexual activity: Not on file  Other Topics Concern  . Not on file  Social History Narrative  . Not on file   Current Meds  Medication Sig  . amLODipine (NORVASC) 2.5 MG tablet Take 1 tablet (2.5 mg total) by mouth daily. At night for blood pressure  . denosumab (PROLIA) 60 MG/ML SOSY injection Inject 60 mg into the skin every 6 (  six) months.  . fluticasone (FLONASE) 50 MCG/ACT nasal spray Place into both nostrils daily. Reported on 01/17/2015  . ibuprofen (ADVIL,MOTRIN) 200 MG tablet Take 200 mg by mouth every 6 (six) hours as needed.  . latanoprost (XALATAN) 0.005 % ophthalmic solution INSTILL 1 DROP IN THE LEFT EYE AT NIGHT  . levothyroxine (SYNTHROID, LEVOTHROID) 50 MCG tablet TAKE 1 TABLET BY MOUTH DAILY  . meloxicam (MOBIC) 7.5 MG tablet Take 1 tablet (7.5 mg total) by mouth daily. As needed for joint pain  . Misc Natural Products (OSTEO BI-FLEX JOINT SHIELD PO) Take by mouth.  . Multiple Vitamins-Minerals (CENTRUM SILVER PO) Take by mouth.  . Multiple Vitamins-Minerals (PRESERVISION AREDS 2 PO) Take by mouth.  Vladimir Faster Glycol-Propyl Glycol (SYSTANE OP) Apply to eye.  . pravastatin (PRAVACHOL) 20 MG tablet TAKE 1 TABLET(20 MG) BY MOUTH DAILY   Allergies  Allergen Reactions  . Sulfa Antibiotics Other (See Comments)    Childhood reaction    Recent  Results (from the past 2160 hour(s))  TSH     Status: None   Collection Time: 12/21/17  2:08 PM  Result Value Ref Range   TSH 1.44 0.35 - 4.50 uIU/mL  Comp Met (CMET)     Status: Abnormal   Collection Time: 12/21/17  2:08 PM  Result Value Ref Range   Sodium 140 135 - 145 mEq/L   Potassium 4.0 3.5 - 5.1 mEq/L   Chloride 104 96 - 112 mEq/L   CO2 32 19 - 32 mEq/L   Glucose, Bld 167 (H) 70 - 99 mg/dL   BUN 13 6 - 23 mg/dL   Creatinine, Ser 0.82 0.40 - 1.20 mg/dL   Total Bilirubin 0.5 0.2 - 1.2 mg/dL   Alkaline Phosphatase 50 39 - 117 U/L   AST 19 0 - 37 U/L   ALT 16 0 - 35 U/L   Total Protein 6.7 6.0 - 8.3 g/dL   Albumin 4.0 3.5 - 5.2 g/dL   Calcium 9.4 8.4 - 10.5 mg/dL   GFR 69.82 >60.00 mL/min  Hemoglobin A1c     Status: None   Collection Time: 01/26/18  9:56 AM  Result Value Ref Range   Hgb A1c MFr Bld 5.9 4.6 - 6.5 %    Comment: Glycemic Control Guidelines for People with Diabetes:Non Diabetic:  <6%Goal of Therapy: <7%Additional Action Suggested:  >1%   Basic metabolic panel     Status: Abnormal   Collection Time: 02/21/18 11:15 AM  Result Value Ref Range   Sodium 139 135 - 145 mmol/L   Potassium 4.0 3.5 - 5.1 mmol/L   Chloride 107 98 - 111 mmol/L   CO2 26 22 - 32 mmol/L   Glucose, Bld 123 (H) 70 - 99 mg/dL   BUN 15 8 - 23 mg/dL   Creatinine, Ser 0.59 0.44 - 1.00 mg/dL   Calcium 9.1 8.9 - 10.3 mg/dL   GFR calc non Af Amer >60 >60 mL/min   GFR calc Af Amer >60 >60 mL/min   Anion gap 6 5 - 15    Comment: Performed at [facility], [address] [place], [place] [address]  CBC     Status: None   Collection Time: 02/21/18 11:15 AM  Result Value Ref Range   WBC 7.3 4.0 - 10.5 K/uL   RBC 4.82 3.87 - 5.11 MIL/uL   Hemoglobin 14.6 12.0 - 15.0 g/dL   HCT 45.0 36.0 - 46.0 %   MCV 93.4 80.0 - 100.0 fL  MCH 30.3 26.0 - 34.0 pg   MCHC 32.4 30.0 - 36.0 g/dL   RDW 12.4 11.5 - 15.5 %   Platelets 279 150 - 400 K/uL   nRBC 0.0 0.0 - 0.2 %    Comment: Performed at  [facility], [address] [place], [place] [address]  Troponin I - ONCE - STAT     Status: None   Collection Time: 02/21/18 11:15 AM  Result Value Ref Range   Troponin I <0.03 <0.03 ng/mL    Comment: Performed at [facility], [address] [place], [place] [address]  Hepatic function panel     Status: None   Collection Time: 02/21/18 11:15 AM  Result Value Ref Range   Total Protein 6.5 6.5 - 8.1 g/dL   Albumin 3.9 3.5 - 5.0 g/dL   AST 24 15 - 41 U/L   ALT 15 0 - 44 U/L   Alkaline Phosphatase 48 38 - 126 U/L   Total Bilirubin 0.6 0.3 - 1.2 mg/dL   Bilirubin, Direct 0.1 0.0 - 0.2 mg/dL   Indirect Bilirubin 0.5 0.3 - 0.9 mg/dL    Comment: Performed at [facility], [address] [place], [place] [address]  Lipase, blood     Status: None   Collection Time: 02/21/18 11:15 AM  Result Value Ref Range   Lipase 34 11 - 51 U/L    Comment: Performed at [facility], [address] [place], [place] [address]  Troponin I - Once     Status: None   Collection Time: 02/21/18  2:20 PM  Result Value Ref Range   Troponin I <0.03 <0.03 ng/mL    Comment: Performed at [facility], [address] [place], [place] [address]   Objective  Body mass index is 21.43 kg/m. Wt Readings from Last 3 Encounters:  02/24/18 106 lb 1.9 oz (48.1 kg)  02/21/18 105 lb (47.6 kg)  12/21/17 108 lb 12.8 oz (49.4 kg)   Temp Readings from Last 3 Encounters:  02/24/18 98.1 F (36.7 C) (Oral)  02/21/18 98.3 F (36.8 C) (Oral)  12/21/17 97.8 F (36.6 C) (Oral)   BP Readings from Last 3 Encounters:  02/24/18 112/64  02/21/18 (!) 166/90  12/21/17 118/72   Pulse Readings from Last 3 Encounters:  02/24/18 83  02/21/18 70  12/21/17 77    Physical Exam Vitals signs and nursing note reviewed.  Constitutional:      Appearance: Normal appearance. She is well-developed.  HENT:     Head: Normocephalic and atraumatic.     Nose: Nose normal.      Mouth/Throat:     Mouth: Mucous membranes are moist.     Pharynx: Oropharynx is clear.  Eyes:     Conjunctiva/sclera: Conjunctivae normal.     Pupils: Pupils are equal, round, and reactive to light.  Cardiovascular:     Rate and Rhythm: Normal rate and regular rhythm.     Pulses: Normal pulses.     Heart sounds: Normal heart sounds.  Abdominal:     Tenderness: There is no abdominal tenderness.  Skin:    General: Skin is warm and dry.  Neurological:     General: No focal deficit present.     Mental Status: She is alert. Mental status is at baseline.     Gait: Gait normal.  Psychiatric:        Attention and Perception: Attention and perception normal.        Mood and  Affect: Mood and affect normal.        Speech: Speech normal.        Behavior: Behavior normal. Behavior is cooperative.        Thought Content: Thought content normal.        Cognition and Memory: Cognition and memory normal.        Judgment: Judgment normal.     Assessment   1. GERD likely cause of CP 2/2 hiatal hernia though will r/o cardiac etiology as well  2. Allergic rhinitis  Plan   1. Trial of pepcid  Stop Advil with mobic and rec use mobic prn for neck pain  Given diet list  F/u PCP  Referred cards r/o cardiac etiology with stress test and echo  2. rec prn allergy pill otc zyrtec, allegra, clartin 1/2 dose qhs with flonase   Provider: Dr. Olivia Mackie McLean-Scocuzza-Internal Medicine  Pre visit review using our clinic review tool, if applicable. No additional management support is needed unless otherwise documented below in the visit note.

## 2018-04-06 ENCOUNTER — Other Ambulatory Visit: Payer: Self-pay | Admitting: Internal Medicine

## 2018-04-20 ENCOUNTER — Telehealth: Payer: Self-pay | Admitting: Internal Medicine

## 2018-04-20 NOTE — Telephone Encounter (Signed)
Insurance verification for Prolia filed on [employer] Portal. 

## 2018-04-26 NOTE — Telephone Encounter (Signed)
Prolia injection scheduled. °

## 2018-04-27 ENCOUNTER — Ambulatory Visit (INDEPENDENT_AMBULATORY_CARE_PROVIDER_SITE_OTHER): Payer: PPO

## 2018-04-27 ENCOUNTER — Other Ambulatory Visit: Payer: Self-pay

## 2018-04-27 DIAGNOSIS — M8000XS Age-related osteoporosis with current pathological fracture, unspecified site, sequela: Secondary | ICD-10-CM | POA: Diagnosis not present

## 2018-04-27 MED ORDER — DENOSUMAB 60 MG/ML ~~LOC~~ SOSY
60.0000 mg | PREFILLED_SYRINGE | Freq: Once | SUBCUTANEOUS | Status: AC
  Administered 2018-04-27: 60 mg via SUBCUTANEOUS

## 2018-04-27 NOTE — Progress Notes (Signed)
Patient presented today for Prolia injection.  Administered SubC in right upper arm.  Patient tolerated well.

## 2018-06-23 ENCOUNTER — Other Ambulatory Visit: Payer: Self-pay | Admitting: Internal Medicine

## 2018-06-23 DIAGNOSIS — Z1231 Encounter for screening mammogram for malignant neoplasm of breast: Secondary | ICD-10-CM

## 2018-07-06 DIAGNOSIS — H353122 Nonexudative age-related macular degeneration, left eye, intermediate dry stage: Secondary | ICD-10-CM | POA: Diagnosis not present

## 2018-07-10 ENCOUNTER — Other Ambulatory Visit: Payer: Self-pay

## 2018-07-10 MED ORDER — LEVOTHYROXINE SODIUM 50 MCG PO TABS: 50.0000 ug | ORAL_TABLET | Freq: Every day | ORAL | 0 refills | Status: DC

## 2018-07-11 ENCOUNTER — Other Ambulatory Visit: Payer: Self-pay

## 2018-07-11 ENCOUNTER — Ambulatory Visit
Admission: RE | Admit: 2018-07-11 | Discharge: 2018-07-11 | Disposition: A | Discharge status: Home / Self Care | Payer: PPO | Source: Ambulatory Visit | Attending: Internal Medicine | Admitting: Internal Medicine

## 2018-07-11 DIAGNOSIS — Z1231 Encounter for screening mammogram for malignant neoplasm of breast: Secondary | ICD-10-CM | POA: Insufficient documentation

## 2018-07-24 ENCOUNTER — Encounter: Payer: Self-pay | Admitting: Family Medicine

## 2018-07-24 ENCOUNTER — Other Ambulatory Visit: Payer: Self-pay

## 2018-07-24 ENCOUNTER — Ambulatory Visit (INDEPENDENT_AMBULATORY_CARE_PROVIDER_SITE_OTHER): Payer: PPO | Admitting: Family Medicine

## 2018-07-24 DIAGNOSIS — N390 Urinary tract infection, site not specified: Secondary | ICD-10-CM

## 2018-07-24 MED ORDER — CEPHALEXIN 500 MG PO CAPS: 500.0000 mg | ORAL_CAPSULE | Freq: Two times a day (BID) | ORAL | 0 refills | Status: DC

## 2018-07-24 NOTE — Progress Notes (Signed)
Patient ID: Gloria Carr, female   DOB: January 11, 1928, 85 y.o.   MRN: [id number]    Virtual Visit via phone Note  This visit type was conducted due to national recommendations for restrictions regarding the COVID-19 pandemic (e.g. social distancing).  This format is felt to be most appropriate for this patient at this time.  All issues noted in this document were discussed and addressed.  No physical exam was performed (except for noted visual exam findings with Video Visits).   I connected with [name] today at 10:00 AM EDT by  telephone and verified that I am speaking with the correct person using two identifiers. Location patient: home Location provider: work or home office Persons participating in the virtual visit: patient, provider  I discussed the limitations, risks, security and privacy concerns of performing an evaluation and management service by telephone and the availability of in person appointments. I also discussed with the patient that there may be a patient responsible charge related to this service. The patient expressed understanding and agreed to proceed.  HPI:  Patient and I connected via telephone to discuss possible UTI.  Patient has had increased urinary frequency, pressure and burning with urination for 2 to 3 days.  Has not had a UTI in many years, but states this feels similar to UTIs she had when she was pregnant.  Denies fever or chills.  No vomiting, nausea or diarrhea.  No cough, shortness of breath or wheezing.  No chest pain. Denies visible blood or mucus in urine.   She did buy cranberry juice in hopes of this helping combat UTI symptoms, but it has not helped.  Patient lives at the [facility] and they have been on a strict lockdown to help prevent spread or exposure to coronavirus.   ROS: See pertinent positives and negatives per HPI.  Past Medical History:  Diagnosis Date  . Allergy   . Arthritis   . Glaucoma   . History of chickenpox   .  History of recurrent UTIs   . Hypertension   . Macular degeneration   . Thyroid disease     Past Surgical History:  Procedure Laterality Date  . BILATERAL CARPAL TUNNEL RELEASE  1999-2000  . CATARACT EXTRACTION, BILATERAL  2013    Family History  Problem Relation Age of Onset  . Stroke Mother   . Cancer Mother 24       colon  . Cancer Father        bone  . Cancer Son        Jaw  . Stroke Sister   . Breast cancer Neg Hx    Social History   Tobacco Use  . Smoking status: Former Research scientist (life sciences)  . Smokeless tobacco: Never Used  Substance Use Topics  . Alcohol use: Yes    Alcohol/week: 5.0 standard drinks    Types: 5 Standard drinks or equivalent per week    Comment: with dinner    Current Outpatient Medications:  .  amLODipine (NORVASC) 2.5 MG tablet, TAKE 1 TABLET(2.5 MG) BY MOUTH DAILY AT NIGHT FOR BLOOD PRESSURE, Disp: 90 tablet, Rfl: 1 .  denosumab (PROLIA) 60 MG/ML SOSY injection, Inject 60 mg into the skin every 6 (six) months., Disp: , Rfl:  .  famotidine (PEPCID) 20 MG tablet, Take 1 tablet (20 mg total) by mouth daily., Disp: 90 tablet, Rfl: 3 .  fluticasone (FLONASE) 50 MCG/ACT nasal spray, Place into both nostrils daily. Reported on 01/17/2015, Disp: , Rfl:  .  latanoprost (XALATAN) 0.005 % ophthalmic solution, INSTILL 1 DROP IN THE LEFT EYE AT NIGHT, Disp: , Rfl: 5 .  levothyroxine (SYNTHROID) 50 MCG tablet, Take 1 tablet (50 mcg total) by mouth daily., Disp: 90 tablet, Rfl: 0 .  meloxicam (MOBIC) 7.5 MG tablet, Take 1 tablet (7.5 mg total) by mouth daily. As needed for joint pain, Disp: 30 tablet, Rfl: 5 .  Misc Natural Products (OSTEO BI-FLEX JOINT SHIELD PO), Take by mouth., Disp: , Rfl:  .  Multiple Vitamins-Minerals (CENTRUM SILVER PO), Take by mouth., Disp: , Rfl:  .  Multiple Vitamins-Minerals (PRESERVISION AREDS 2 PO), Take by mouth., Disp: , Rfl:  .  Polyethyl Glycol-Propyl Glycol (SYSTANE OP), Apply to eye., Disp: , Rfl:  .  pravastatin (PRAVACHOL) 20 MG  tablet, TAKE 1 TABLET(20 MG) BY MOUTH DAILY, Disp: 90 tablet, Rfl: 1  EXAM:  GENERAL: alert, oriented, sounds well and in no acute distress  LUNGS: Speaking in full sentences, no signs of respiratory distress, breathing rate appears normal, no obvious gross SOB, gasping, coughing or wheezing  PSYCH/NEURO: pleasant and cooperative, no obvious depression or anxiety, speech and thought processing grossly intact  ASSESSMENT AND PLAN:  Discussed the following assessment and plan:  UTI -- patient symptoms do sound suspicious for UTI.  Due to the coronavirus pandemic, we are attempting to keep you go home is much as possible so we will treat as UTI with Keflex twice daily for 5 days.  Advised that if her symptoms persist even after this treatment, then at that point we will ask her to come into office to bring a urine sample.  Advised to increase water intake, avoid excess sugary caffeinated beverages, wear cotton underwear, be sure to wipe front to back after using restroom and be diligent with handwashing.  Advised that the cranberry juice sold in most grocery stores is often for low sugar, so if she is to drink this I would recommend doing half cranberry juice half water to help cut the sugar content.   I discussed the assessment and treatment plan with the patient. The patient was provided an opportunity to ask questions and all were answered. The patient agreed with the plan and demonstrated an understanding of the instructions.   The patient was advised to call back or seek an in-person evaluation if the symptoms worsen or if the condition fails to improve as anticipated.  I provided 12 minutes of non-face-to-face time over phone during this encounter.   Jodelle Green, FNP

## 2018-07-28 ENCOUNTER — Other Ambulatory Visit: Payer: Self-pay

## 2018-07-28 ENCOUNTER — Other Ambulatory Visit (INDEPENDENT_AMBULATORY_CARE_PROVIDER_SITE_OTHER): Payer: PPO

## 2018-07-28 ENCOUNTER — Telehealth: Payer: Self-pay | Admitting: Internal Medicine

## 2018-07-28 DIAGNOSIS — N390 Urinary tract infection, site not specified: Secondary | ICD-10-CM

## 2018-07-28 LAB — URINALYSIS, MICROSCOPIC ONLY

## 2018-07-28 NOTE — Telephone Encounter (Signed)
Yes,  Obtain urine specimen today.  Will hold off on any more antibiotics unless patient is having nausea,  Chills,  Flank pain., or if she is not able to be contacted over the weekend (going out of town, Social research officer, government)

## 2018-07-28 NOTE — Telephone Encounter (Signed)
Patient has no nausea , fever or chills will come into office for urine, can she take AZO over the counter for symptoms of frequency and burning?

## 2018-07-28 NOTE — Telephone Encounter (Signed)
Pt saw lauren guse on 07-24-2018 for uti. Pt still has one day of abx left which is today. Pt does not feel any relief. Please advise

## 2018-07-28 NOTE — Telephone Encounter (Signed)
Patient had Telephone visit with Lauren on 07/24/18 has one day of keflex left and says symptoms no better, asking what should she do?  NP out of office today. NO Ua was attained was conducted as telephone encounter. I am having patient come in for UA culture sensitivity? I have patient coming in today for labs Ua, culture, please advise plan ok?

## 2018-07-28 NOTE — Telephone Encounter (Signed)
Patient notified

## 2018-07-28 NOTE — Telephone Encounter (Signed)
Yes  axook to use

## 2018-07-30 LAB — URINE CULTURE
MICRO NUMBER:: 678765
SPECIMEN QUALITY:: ADEQUATE

## 2018-07-31 ENCOUNTER — Ambulatory Visit (INDEPENDENT_AMBULATORY_CARE_PROVIDER_SITE_OTHER): Payer: PPO | Admitting: Family Medicine

## 2018-07-31 ENCOUNTER — Encounter: Payer: Self-pay | Admitting: Family Medicine

## 2018-07-31 ENCOUNTER — Other Ambulatory Visit: Payer: Self-pay

## 2018-07-31 DIAGNOSIS — N3281 Overactive bladder: Secondary | ICD-10-CM

## 2018-07-31 DIAGNOSIS — R300 Dysuria: Secondary | ICD-10-CM

## 2018-07-31 MED ORDER — CRANBERRY PLUS PROBIOTIC PO TABS: 1.0000 | ORAL_TABLET | Freq: Every day | ORAL | 2 refills | Status: DC

## 2018-07-31 MED ORDER — OXYBUTYNIN CHLORIDE ER 10 MG PO TB24: 10.0000 mg | ORAL_TABLET | Freq: Every day | ORAL | 2 refills | Status: DC

## 2018-07-31 NOTE — Progress Notes (Addendum)
Patient ID: Gloria Carr, female   DOB: 21-Sep-1927, 85 y.o.   MRN: [id number]    Virtual Visit via phone Note  This visit type was conducted due to national recommendations for restrictions regarding the COVID-19 pandemic (e.g. social distancing).  This format is felt to be most appropriate for this patient at this time.  All issues noted in this document were discussed and addressed.  No physical exam was performed (except for noted visual exam findings with Video Visits).   I connected with [name] today at  9:20 AM EDT by telephone and verified that I am speaking with the correct person using two identifiers. Location patient: home Location provider: work or home office Persons participating in the virtual visit: patient, provider  I discussed the limitations, risks, security and privacy concerns of performing an evaluation and management service by telephone and the availability of in person appointments. I also discussed with the patient that there may be a patient responsible charge related to this service. The patient expressed understanding and agreed to proceed.  HPI:  Patient and I connected via phone due to continued complaints of UTI type symptoms.  She was seen on 07/24/2018 for suspected UTI.  At that time we did not have her bring urine sample in the office due to the COVID-19 pandemic and there being a strict lockdown at her senior living apartments.  Patient was treated with antibiotics and twice daily for 5 days.  After antibiotic course, symptoms did not seem to go away.  Continued to have urinary frequency and urgency and some burning.  She brought in a urine sample to be tested at that time.  Urine culture grew negative for any bacteria.  After urine culture results via her back, patient was advised to use Azo to see if this would help calm symptoms.  Patient states she has used Azo, did help calm symptoms somewhat but they still are present.  States she feels she has to use  restroom frequently, but will only go small amounts.  Has been trying to keep up good water intake, but at times it is hard.  She does like ginger ale but is trying not to drink as much sugar. Denies vaginal itching or vaginal discharge.  Denies fever or chills.  Denies vomiting or diarrhea.  Has had a good appetite.  Denies body aches.  No cough, shortness of breath or wheezing.  No chest pain.   ROS: See pertinent positives and negatives per HPI.  Past Medical History:  Diagnosis Date  . Allergy   . Arthritis   . Glaucoma   . History of chickenpox   . History of recurrent UTIs   . Hypertension   . Macular degeneration   . Thyroid disease     Past Surgical History:  Procedure Laterality Date  . BILATERAL CARPAL TUNNEL RELEASE  1999-2000  . CATARACT EXTRACTION, BILATERAL  2013    Family History  Problem Relation Age of Onset  . Stroke Mother   . Cancer Mother 88       colon  . Cancer Father        bone  . Cancer Son        Jaw  . Stroke Sister   . Breast cancer Neg Hx    Social History   Tobacco Use  . Smoking status: Former Research scientist (life sciences)  . Smokeless tobacco: Never Used  Substance Use Topics  . Alcohol use: Yes    Alcohol/week: 5.0 standard drinks  Types: 5 Standard drinks or equivalent per week    Comment: with dinner    Current Outpatient Medications:  .  amLODipine (NORVASC) 2.5 MG tablet, TAKE 1 TABLET(2.5 MG) BY MOUTH DAILY AT NIGHT FOR BLOOD PRESSURE, Disp: 90 tablet, Rfl: 1 .  cephALEXin (KEFLEX) 500 MG capsule, Take 1 capsule (500 mg total) by mouth 2 (two) times daily., Disp: 10 capsule, Rfl: 0 .  denosumab (PROLIA) 60 MG/ML SOSY injection, Inject 60 mg into the skin every 6 (six) months., Disp: , Rfl:  .  famotidine (PEPCID) 20 MG tablet, Take 1 tablet (20 mg total) by mouth daily., Disp: 90 tablet, Rfl: 3 .  fluticasone (FLONASE) 50 MCG/ACT nasal spray, Place into both nostrils daily. Reported on 01/17/2015, Disp: , Rfl:  .  latanoprost (XALATAN) 0.005 %  ophthalmic solution, INSTILL 1 DROP IN THE LEFT EYE AT NIGHT, Disp: , Rfl: 5 .  levothyroxine (SYNTHROID) 50 MCG tablet, Take 1 tablet (50 mcg total) by mouth daily., Disp: 90 tablet, Rfl: 0 .  meloxicam (MOBIC) 7.5 MG tablet, Take 1 tablet (7.5 mg total) by mouth daily. As needed for joint pain, Disp: 30 tablet, Rfl: 5 .  Misc Natural Products (OSTEO BI-FLEX JOINT SHIELD PO), Take by mouth., Disp: , Rfl:  .  Multiple Vitamins-Minerals (CENTRUM SILVER PO), Take by mouth., Disp: , Rfl:  .  Multiple Vitamins-Minerals (PRESERVISION AREDS 2 PO), Take by mouth., Disp: , Rfl:  .  Polyethyl Glycol-Propyl Glycol (SYSTANE OP), Apply to eye., Disp: , Rfl:  .  pravastatin (PRAVACHOL) 20 MG tablet, TAKE 1 TABLET(20 MG) BY MOUTH DAILY, Disp: 90 tablet, Rfl: 1  EXAM:  GENERAL: alert, oriented, sounds well and in no acute distress  LUNGS: Speaking in full sentences, no signs of respiratory distress, breathing rate appears normal, no obvious gross SOB, gasping or wheezing  PSYCH/NEURO: pleasant and cooperative, no obvious depression or anxiety, speech and thought processing grossly intact  ASSESSMENT AND PLAN:  Discussed the following assessment and plan:  Overactive bladder/dysuria-Long discussion with patient regards to her symptoms not being from infection as indicated by her urine culture results.  Patient is getting very frustrated with her symptoms and does not know why they did not go away with antibiotic treatment.  Explained to patient that she could have overactive bladder as well as vaginal dryness/atrophy that is causing the symptoms she is having.  Discussed different options with patient.  She is agreeable to try an overactive bladder medication called Ditropan to see if this will help calm symptoms.  Also she will begin taking cranberry probiotic daily and is agreeable to urogynecology referral for evaluation.  Also recommended good water intake, continue to avoid excess sugar in beverages.   Reassured patient that it is good her urine is negative for UTI as UTIs can often make someone extremely ill.  Also reassured patient we are doing a good work-up to investigate her symptoms and treat them as best as possible.   I discussed the assessment and treatment plan with the patient. The patient was provided an opportunity to ask questions and all were answered. The patient agreed with the plan and demonstrated an understanding of the instructions.   The patient was advised to call back or seek an in-person evaluation if the symptoms worsen or if the condition fails to improve as anticipated.  I provided 15 minutes of non-face-to-face time during this encounter.   Jodelle Green, FNP

## 2018-07-31 NOTE — Telephone Encounter (Signed)
Patient having fatigue says, and SOB with mask on scheduled virtual with NP.

## 2018-07-31 NOTE — Telephone Encounter (Signed)
Pt called this morning and states she is still having frequency, up all night going to the bathroom. Pt took the AZO, and that only short term relief, and still having burning. Pt would like to know should she come in? Please advise.

## 2018-08-04 ENCOUNTER — Telehealth: Payer: Self-pay | Admitting: Internal Medicine

## 2018-08-04 NOTE — Telephone Encounter (Signed)
Called and spoke pt.  Pt had a virtual with Gloria Nettle, FNP on 07/31/18.  Pt said she started with an antibiotic for 5 days.  Completed and not working.  Pt said that AZO did not help either.  Pt said she was then prescribed oxybutynin which has not helped.  Pt is still having a burning sensation and frequency of using the bathroom about every 10 minutes.   Pt said that she was given 90 tablets of the oxybutynin at the pharmacy.  Although Rx was written for 30 tabs with 2 refills.  Confirmed that pt was using the Cranberry Plus Probiotic.  Pt said she wasn't aware of Rx and was not given that Rx at her pharmacy.  Advised pt to call pharmacy to find out if they have filled the Rx.  Pt wants to check on referral to urology.

## 2018-08-04 NOTE — Telephone Encounter (Signed)
Awesome, thanks.

## 2018-08-04 NOTE — Telephone Encounter (Signed)
Called Pt No answer left a VM will try back later.

## 2018-08-04 NOTE — Telephone Encounter (Signed)
Pt called [name] Called and spoke pt.  Pt had a virtual with Philis Nettle, FNP on 07/31/18.  Pt said she started with an antibiotic for 5 days.  Completed and not working.  Pt said that AZO did not help either.  Pt said she was then prescribed oxybutynin which has not helped.  Pt is still having a burning sensation and frequency of using the bathroom about every 10 minutes.   Pt said that she was given 90 tablets of the oxybutynin at the pharmacy.  Although Rx was written for 30 tabs with 2 refills.  Confirmed that pt was using the Cranberry Plus Probiotic.  Pt said she wasn't aware of Rx and was not given that Rx at her pharmacy.  Advised pt to call pharmacy to find out if they have filled the Rx.  Pt wants to check on referral to urology.

## 2018-08-04 NOTE — Telephone Encounter (Signed)
Pt called [name] Gave pt advisement from PCP's notes. Pt. picked up Cranberry Probiotic AZO from pharmacy this morning.  Pt said that she just took a tablet about 10-15 minutes ago and said that it is already working.    Pt wants to wait until next Monday to see how it works.  Pt will call back on Monday to scheduled an appt if symptoms don't improve.  Pt aware that message has been sent to [facility] for a referral update.

## 2018-08-04 NOTE — Telephone Encounter (Signed)
Urology referral was placed on 07/31/2018, I will forward to [facility] for update  Her Urine Culture from 7/17 was negative for UTI so I do not think another antibiotic is required.  She can come into clinic today for visit with pelvic exam and we can repeat urine testing. Or if she declines pelvic exam, we can just re-test urine  I would advise beginning the probiotic to see if this helps.

## 2018-08-04 NOTE — Telephone Encounter (Signed)
Pt reports she is still having burning with urination, frequency, up all night going to the bathroom. Pt states she is not getting any sleep.  Pt states the med she was given has not helped as of yet.  Pt wants to know if referral has been done? Please advise.  Pt states this is another week and she is not getting any rest.

## 2018-08-04 NOTE — Telephone Encounter (Signed)
Gave pt advisement from PCP's notes. Pt. picked up Cranberry Probiotic AZO from pharmacy this morning.  Pt said that she just took a tablet about 10-15 minutes ago and said that it is already working.    Pt wants to wait until next Monday to see how it works.  Pt will call back on Monday to scheduled an appt if symptoms don't improve.  Pt aware that message has been sent to [facility] for a referral update.

## 2018-08-08 ENCOUNTER — Ambulatory Visit (INDEPENDENT_AMBULATORY_CARE_PROVIDER_SITE_OTHER): Payer: PPO | Admitting: Internal Medicine

## 2018-08-08 ENCOUNTER — Encounter: Payer: Self-pay | Admitting: Internal Medicine

## 2018-08-08 ENCOUNTER — Other Ambulatory Visit: Payer: Self-pay

## 2018-08-08 DIAGNOSIS — N952 Postmenopausal atrophic vaginitis: Secondary | ICD-10-CM | POA: Diagnosis not present

## 2018-08-08 MED ORDER — ESTROGENS, CONJUGATED 0.625 MG/GM VA CREA: TOPICAL_CREAM | VAGINAL | 12 refills | Status: DC

## 2018-08-08 NOTE — Progress Notes (Signed)
Telephone Note  This visit type was conducted due to national recommendations for restrictions regarding the COVID-19 pandemic (e.g. social distancing).  This format is felt to be most appropriate for this patient at this time.  All issues noted in this document were discussed and addressed.  No physical exam was performed (except for noted visual exam findings with Video Visits).   I connected with@ on 08/09/18 at  2:00 PM EDT by a video enabled telemedicine application or telephone and verified that I am speaking with the correct person using two identifiers. Location patient: home Location provider: work or home office Persons participating in the virtual visit: patient, provider  I discussed the limitations, risks, security and privacy concerns of performing an evaluation and management service by telephone and the availability of in person appointments. I also discussed with the patient that there may be a patient responsible charge related to this service. The patient expressed understanding and agreed to proceed.  Reason for visit: persistent dysuria   HPI: 85 yr old female presents for follow up on recent workup for dysuria.   UTI presumed July 17  But culture negative although pyuria was noted.  Pain is located around the urethra.  Started on oxybutynin  for OAB and cranberry/probiotic by Lauren with no significant change.  Has urology referral in progress but her appt is not until September  Discussed trial of topical estrogen to use around the urethra    ROS: See pertinent positives and negatives per HPI.  Past Medical History:  Diagnosis Date  . Allergy   . Arthritis   . Glaucoma   . History of chickenpox   . History of recurrent UTIs   . Hypertension   . Macular degeneration   . Thyroid disease     Past Surgical History:  Procedure Laterality Date  . BILATERAL CARPAL TUNNEL RELEASE  1999-2000  . CATARACT EXTRACTION, BILATERAL  2013    Family History  Problem  Relation Age of Onset  . Stroke Mother   . Cancer Mother 39       colon  . Cancer Father        bone  . Cancer Son        Jaw  . Stroke Sister   . Breast cancer Neg Hx     SOCIAL HX:  reports that she has quit smoking. She has never used smokeless tobacco. She reports current alcohol use of about 5.0 standard drinks of alcohol per week. She reports that she does not use drugs.   Current Outpatient Medications:  .  amLODipine (NORVASC) 2.5 MG tablet, TAKE 1 TABLET(2.5 MG) BY MOUTH DAILY AT NIGHT FOR BLOOD PRESSURE, Disp: 90 tablet, Rfl: 1 .  cephALEXin (KEFLEX) 500 MG capsule, Take 1 capsule (500 mg total) by mouth 2 (two) times daily., Disp: 10 capsule, Rfl: 0 .  Cranberry-Vit C-Probiotic-Ca (CRANBERRY PLUS PROBIOTIC) TABS, Take 1 tablet by mouth daily., Disp: 30 tablet, Rfl: 2 .  denosumab (PROLIA) 60 MG/ML SOSY injection, Inject 60 mg into the skin every 6 (six) months., Disp: , Rfl:  .  famotidine (PEPCID) 20 MG tablet, Take 1 tablet (20 mg total) by mouth daily., Disp: 90 tablet, Rfl: 3 .  fluticasone (FLONASE) 50 MCG/ACT nasal spray, Place into both nostrils daily. Reported on 01/17/2015, Disp: , Rfl:  .  latanoprost (XALATAN) 0.005 % ophthalmic solution, INSTILL 1 DROP IN THE LEFT EYE AT NIGHT, Disp: , Rfl: 5 .  levothyroxine (SYNTHROID) 50 MCG tablet, Take 1 tablet (  50 mcg total) by mouth daily., Disp: 90 tablet, Rfl: 0 .  meloxicam (MOBIC) 7.5 MG tablet, Take 1 tablet (7.5 mg total) by mouth daily. As needed for joint pain, Disp: 30 tablet, Rfl: 5 .  Misc Natural Products (OSTEO BI-FLEX JOINT SHIELD PO), Take by mouth., Disp: , Rfl:  .  Multiple Vitamins-Minerals (CENTRUM SILVER PO), Take by mouth., Disp: , Rfl:  .  Multiple Vitamins-Minerals (PRESERVISION AREDS 2 PO), Take by mouth., Disp: , Rfl:  .  oxybutynin (DITROPAN-XL) 10 MG 24 hr tablet, Take 1 tablet (10 mg total) by mouth at bedtime., Disp: 30 tablet, Rfl: 2 .  Polyethyl Glycol-Propyl Glycol (SYSTANE OP), Apply to eye.,  Disp: , Rfl:  .  pravastatin (PRAVACHOL) 20 MG tablet, TAKE 1 TABLET(20 MG) BY MOUTH DAILY, Disp: 90 tablet, Rfl: 1 .  conjugated estrogens (PREMARIN) vaginal cream, Place a small amount around the urethra every night for two weeks,  THEN 3 TIMES WEEKLY, Disp: 42.5 g, Rfl: 12  EXAM:  ASSESSMENT AND PLAN:   General impression: alert, cooperative and articulate.  No signs of being in distress  Lungs: speech is fluent sentence length suggests that patient is not short of breath and not punctuated by cough, sneezing or sniffing. Marland Kitchen   Psych: affect normal.  speech is articulate and non pressured .  Denies suicidal thoughts   Discussed the following assessment and plan:  Post-menopausal atrophic vaginitis  Post-menopausal atrophic vaginitis Trial of vaginal estrogen to use around the urethra given the absence of urinary tract infection    I discussed the assessment and treatment plan with the patient. The patient was provided an opportunity to ask questions and all were answered. The patient agreed with the plan and demonstrated an understanding of the instructions.   The patient was advised to call back or seek an in-person evaluation if the symptoms worsen or if the condition fails to improve as anticipated.  I provided 22 minutes of non-face-to-face time during this encounter.   Crecencio Mc, MD

## 2018-08-09 DIAGNOSIS — N952 Postmenopausal atrophic vaginitis: Secondary | ICD-10-CM | POA: Insufficient documentation

## 2018-08-09 NOTE — Assessment & Plan Note (Signed)
Trial of vaginal estrogen to use around the urethra given the absence of urinary tract infection

## 2018-08-14 ENCOUNTER — Telehealth: Payer: Self-pay | Admitting: Internal Medicine

## 2018-08-14 MED ORDER — MIRABEGRON ER 25 MG PO TB24: 25.0000 mg | ORAL_TABLET | Freq: Every day | ORAL | 5 refills | Status: DC

## 2018-08-14 NOTE — Telephone Encounter (Signed)
Spoke with the pt and she stated that she will try increasing the dose of the oxybutynin.

## 2018-08-14 NOTE — Telephone Encounter (Signed)
Pt is wanting to know if she is supposed to be taking the oxybutin and the Myrbetric? Pt stated that she is already taking the oxybutin.

## 2018-08-14 NOTE — Telephone Encounter (Signed)
If it not helping with the frequency,  Then she can try the myrbetriq . If she wants to double  the oxybutynin dose as  A trial BEFORE switching,  She can try that

## 2018-08-14 NOTE — Telephone Encounter (Signed)
Pt called stating she is still having lots of vaginal pain. Pt states PCP told her to get in contact if the cream did not work. Pt asking to move up her urologist appt. Please advise.

## 2018-08-14 NOTE — Telephone Encounter (Signed)
Please forward to Melissa to see if appt can be moved up  Tell patient to keep using the estrogen cream because she has only had the prescription it for 3 -4 days!!  She should be applying it to her urethra (where her urine comes out )and go ahead and insert some using the application tube.  I will call   in somemedication for overactive bladder ,  But the bladder pain pills are OTC and her insurance won't pay.  (AZO)

## 2018-08-14 NOTE — Telephone Encounter (Signed)
Pt is calling - she got a call from [facility] and does not know what it is about. Please call back

## 2018-08-14 NOTE — Telephone Encounter (Signed)
Pt c/o being exhausted due to constantly getting up to use the bathroom.  Pt c/o vaginal pain, frequent urination and burning sensation w/ urination.  Pt said that the premarin vaginal cream has not helped.  Pt rated her vaginal pain a 9 out 10 today.  Pt said that her stomach looks distended and seems bloated.  No fever.  No noticeable blood in urine.  Pt said that Dr. Derrel Nip advised her to call back if cream did not work.  Pt requested for her appt w/ urology to be moved up sooner and said she was told to contact this office about getting appt moved up.  Pt was given number to [facility] where referral was made and advised that she could call as well to see if she could get an appt sooner or get on the cancellation list.

## 2018-08-15 NOTE — Telephone Encounter (Signed)
lvm @ [place] to rtc to see if they can get her in sooner than Sept with any provider.

## 2018-08-15 NOTE — Telephone Encounter (Signed)
THANK YOU!!  THANK YOU! [place]!

## 2018-08-15 NOTE — Telephone Encounter (Signed)
Her appointment has been rescheduled for Aug. 12 at 11:30 with Dr. Junious Silk. She has been notified. Thanks [employer]

## 2018-08-18 ENCOUNTER — Other Ambulatory Visit: Payer: Self-pay | Admitting: Internal Medicine

## 2018-08-23 ENCOUNTER — Encounter: Payer: Self-pay | Admitting: Urology

## 2018-08-23 ENCOUNTER — Other Ambulatory Visit: Payer: Self-pay

## 2018-08-23 ENCOUNTER — Ambulatory Visit (INDEPENDENT_AMBULATORY_CARE_PROVIDER_SITE_OTHER): Payer: PPO | Admitting: Urology

## 2018-08-23 VITALS — BP 151/77 | HR 84 | Ht 59.0 in | Wt 102.9 lb

## 2018-08-23 DIAGNOSIS — R8271 Bacteriuria: Secondary | ICD-10-CM | POA: Diagnosis not present

## 2018-08-23 DIAGNOSIS — N3281 Overactive bladder: Secondary | ICD-10-CM | POA: Diagnosis not present

## 2018-08-23 DIAGNOSIS — R3129 Other microscopic hematuria: Secondary | ICD-10-CM | POA: Diagnosis not present

## 2018-08-23 DIAGNOSIS — R300 Dysuria: Secondary | ICD-10-CM

## 2018-08-23 LAB — BLADDER SCAN AMB NON-IMAGING: Scan Result: 77

## 2018-08-23 NOTE — Progress Notes (Signed)
08/23/2018 11:54 AM   Gloria Carr 1927-11-12 [id number]  Referring provider: Crecencio Mc, MD [address] [place],  [place] [address]  Chief Complaint  Patient presents with  . Dysuria  . Over Active Bladder    HPI:  Gloria Carr was referred for dysuria and frequency going since June 2020. She drinks water and two cups of coffee. Also red wine at night. She has hesitancy. She had MH on UA 07/28/2018 with TNTC wbc. Urine culture was < 10k cfu. She tried antibiotics and Azo without relief.  She then was referred to urology and started on Ditropan XL along with cranberry and probiotic tablets. She is on HRT/TV estrogen. She tried Myrbetriq. No prolapse symptoms. Some constipation.   She underwent a CT scan of the abdomen and pelvis 02/21/2018 which showed a normal urinary tract. Her bladder was moderately distended.  Her creatinine was 0.59. Her PVR is 77 ml.   No GU history or surgery. NSVD x 7.   Modifying factors: There are no other modifying factors  Associated signs and symptoms: There are no other associated signs and symptoms Aggravating and relieving factors: There are no other aggravating or relieving factors Severity: Moderate Duration: Persistent   PMH: Past Medical History:  Diagnosis Date  . Allergy   . Arthritis   . Glaucoma   . History of chickenpox   . History of recurrent UTIs   . Hypertension   . Macular degeneration   . Thyroid disease     Surgical History: Past Surgical History:  Procedure Laterality Date  . BILATERAL CARPAL TUNNEL RELEASE  1999-2000  . CATARACT EXTRACTION, BILATERAL  2013    Home Medications:  Allergies as of 08/23/2018      Reactions   Sulfa Antibiotics Other (See Comments)   Childhood reaction       Medication List       Accurate as of August 23, 2018 11:54 AM. If you have any questions, ask your nurse or doctor.        STOP taking these medications   cephALEXin 500 MG capsule Commonly known as: KEFLEX  Stopped by: Festus Aloe, MD   SYSTANE OP Stopped by: Festus Aloe, MD     TAKE these medications   amLODipine 2.5 MG tablet Commonly known as: NORVASC TAKE 1 TABLET(2.5 MG) BY MOUTH DAILY AT NIGHT FOR BLOOD PRESSURE   conjugated estrogens vaginal cream Commonly known as: PREMARIN Place a small amount around the urethra every night for two weeks,  THEN 3 TIMES WEEKLY   Cranberry Plus Probiotic Tabs Take 1 tablet by mouth daily.   denosumab 60 MG/ML Sosy injection Commonly known as: PROLIA Inject 60 mg into the skin every 6 (six) months.   famotidine 20 MG tablet Commonly known as: Pepcid Take 1 tablet (20 mg total) by mouth daily.   Flonase 50 MCG/ACT nasal spray Generic drug: fluticasone Place into both nostrils daily. Reported on 01/17/2015   latanoprost 0.005 % ophthalmic solution Commonly known as: XALATAN INSTILL 1 DROP IN THE LEFT EYE AT NIGHT   levothyroxine 50 MCG tablet Commonly known as: SYNTHROID Take 1 tablet (50 mcg total) by mouth daily.   meloxicam 7.5 MG tablet Commonly known as: MOBIC Take 1 tablet (7.5 mg total) by mouth daily. As needed for joint pain   mirabegron ER 25 MG Tb24 tablet Commonly known as: MYRBETRIQ Take 1 tablet (25 mg total) by mouth daily.   OSTEO BI-FLEX JOINT SHIELD PO Take by mouth.  oxybutynin 10 MG 24 hr tablet Commonly known as: DITROPAN-XL Take 1 tablet (10 mg total) by mouth at bedtime.   pravastatin 20 MG tablet Commonly known as: PRAVACHOL TAKE 1 TABLET(20 MG) BY MOUTH DAILY   PRESERVISION AREDS 2 PO Take by mouth.   CENTRUM SILVER PO Take by mouth.       Allergies:  Allergies  Allergen Reactions  . Sulfa Antibiotics Other (See Comments)    Childhood reaction     Family History: Family History  Problem Relation Age of Onset  . Stroke Mother   . Cancer Mother 80       colon  . Cancer Father        bone  . Cancer Son        Jaw  . Stroke Sister   . Breast cancer Neg Hx     Social  History:  reports that she has quit smoking. She has never used smokeless tobacco. She reports current alcohol use of about 5.0 standard drinks of alcohol per week. She reports that she does not use drugs.  ROS: UROLOGY Frequent Urination?: Yes Hard to postpone urination?: Yes Burning/pain with urination?: Yes Get up at night to urinate?: Yes Leakage of urine?: Yes Urine stream starts and stops?: No Trouble starting stream?: No Do you have to strain to urinate?: No Blood in urine?: No Urinary tract infection?: No Sexually transmitted disease?: No Injury to kidneys or bladder?: No Painful intercourse?: No Weak stream?: No Currently pregnant?: No Vaginal bleeding?: No Last menstrual period?: n  Gastrointestinal Nausea?: No Vomiting?: No Indigestion/heartburn?: No Diarrhea?: No Constipation?: Yes  Constitutional Fever: No Night sweats?: No Weight loss?: Yes Fatigue?: Yes  Skin Skin rash/lesions?: No Itching?: No  Eyes Blurred vision?: No Double vision?: No  Ears/Nose/Throat Sore throat?: No Sinus problems?: No  Hematologic/Lymphatic Swollen glands?: No Easy bruising?: No  Cardiovascular Leg swelling?: No Chest pain?: No  Respiratory Cough?: No Shortness of breath?: Yes  Endocrine Excessive thirst?: Yes  Musculoskeletal Back pain?: No Joint pain?: No  Neurological Headaches?: No Dizziness?: No  Psychologic Depression?: No Anxiety?: No  Physical Exam: BP (!) 151/77 (BP Location: Left Arm, Patient Position: Sitting, Cuff Size: Normal)   Pulse 84   Ht 4\' 11"  (1.499 m)   Wt 46.7 kg   LMP  (LMP Unknown)   BMI 20.78 kg/m   Constitutional:  Alert and oriented, No acute distress. HEENT: [place] AT, moist mucus membranes.  Trachea midline, no masses. Cardiovascular: No clubbing, cyanosis, or edema. Respiratory: Normal respiratory effort, no increased work of breathing. GI: Abdomen is soft, nontender, nondistended, no abdominal masses GU: No CVA  tenderness Lymph: No cervical lymphadenopathy. Skin: No rashes, bruises or suspicious lesions. Neurologic: Grossly intact, no focal deficits, moving all 4 extremities. Psychiatric: Normal mood and affect.  Laboratory Data: Lab Results  Component Value Date   WBC 7.3 02/21/2018   HGB 14.6 02/21/2018   HCT 45.0 02/21/2018   MCV 93.4 02/21/2018   PLT 279 02/21/2018    Lab Results  Component Value Date   CREATININE 0.59 02/21/2018    No results found for: PSA  No results found for: TESTOSTERONE  Lab Results  Component Value Date   HGBA1C 5.9 01/26/2018    Urinalysis    Component Value Date/Time   BILIRUBINUR negative 04/13/2017 1357   PROTEINUR negative 04/13/2017 1357   UROBILINOGEN 0.2 04/13/2017 1357   NITRITE negative 04/13/2017 1357   LEUKOCYTESUR Large (3+) (A) 04/13/2017 1357    Lab Results  Component Value Date   BACTERIA 1+ (A) 07/28/2018    Pertinent Imaging: CT images  No results found for this or any previous visit. No results found for this or any previous visit. No results found for this or any previous visit. No results found for this or any previous visit. No results found for this or any previous visit. No results found for this or any previous visit. No results found for this or any previous visit. No results found for this or any previous visit.  Assessment & Plan:    1. OAB (overactive bladder) Discussed trial of myrbetriq. I gave her 2 weeks of samples. She was rx'd myrbetriq from PCP and thinks it is at her pharmacy. She can stop Ditropan for now. She has bothersome dry mouth. Also try Azo pain relief.   - Bladder Scan (Post Void Residual) in office  2. Dysuria Urine for culture. F/u for renal US, cystoscopy and exam.   3. Bacteriuria - discussed bacteria in the bladder can actually be protective of a UTI so we need to minimize abx (resistance and side effects)   - Urinalysis, Complete  4. MH - as above   No follow-ups on file.   Festus Aloe, MD  [facility] [address], [place], [place] [address] [phone]

## 2018-08-23 NOTE — Patient Instructions (Addendum)
Overactive Bladder, Adult -OK to stop Ditropan/oxybutynin (causes dry mouth) -try Azo urinary pain relief (blue box over the counter) -can try Myrbetriq (mirabegron) for urinary frequency - no dry mouth, possibly raise blood pressure    Overactive bladder refers to a condition in which a person has a sudden need to pass urine. The person may leak urine if he or she cannot get to the bathroom fast enough (urinary incontinence). A person with this condition may also wake up several times in the night to go to the bathroom. Overactive bladder is associated with poor nerve signals between your bladder and your brain. Your bladder may get the signal to empty before it is full. You may also have very sensitive muscles that make your bladder squeeze too soon. These symptoms might interfere with daily work or social activities. What are the causes? This condition may be associated with or caused by:  Urinary tract infection.  Infection of nearby tissues, such as the prostate.  Prostate enlargement.  Surgery on the uterus or urethra.  Bladder stones, inflammation, or tumors.  Drinking too much caffeine or alcohol.  Certain medicines, especially medicines that get rid of extra fluid in the body (diuretics).  Muscle or nerve weakness, especially from: ? A spinal cord injury. ? Stroke. ? Multiple sclerosis. ? Parkinson's disease.  Diabetes.  Constipation. What increases the risk? You may be at greater risk for overactive bladder if you:  Are an older adult.  Smoke.  Are going through menopause.  Have prostate problems.  Have a neurological disease, such as stroke, dementia, Parkinson's disease, or multiple sclerosis (MS).  Eat or drink things that irritate the bladder. These include alcohol, spicy food, and caffeine.  Are overweight or obese. What are the signs or symptoms? Symptoms of this condition include:  Sudden, strong urge to urinate.  Leaking urine.  Urinating 8  or more times a day.  Waking up to urinate 2 or more times a night. How is this diagnosed? Your health care provider may suspect overactive bladder based on your symptoms. He or she will diagnose this condition by:  A physical exam and medical history.  Blood or urine tests. You might need bladder or urine tests to help determine what is causing your overactive bladder. You might also need to see a health care provider who specializes in urinary tract problems (urologist). How is this treated? Treatment for overactive bladder depends on the cause of your condition and whether it is mild or severe. You can also make lifestyle changes at home. Options include:  Bladder training. This may include: ? Learning to control the urge to urinate by following a schedule that directs you to urinate at regular intervals (timed voiding). ? Doing Kegel exercises to strengthen your pelvic floor muscles, which support your bladder. Toning these muscles can help you control urination, even if your bladder muscles are overactive.  Special devices. This may include: ? Biofeedback, which uses sensors to help you become aware of your body's signals. ? Electrical stimulation, which uses electrodes placed inside the body (implanted) or outside the body. These electrodes send gentle pulses of electricity to strengthen the nerves or muscles that control the bladder. ? Women may use a plastic device that fits into the vagina and supports the bladder (pessary).  Medicines. ? Antibiotics to treat bladder infection. ? Antispasmodics to stop the bladder from releasing urine at the wrong time. ? Tricyclic antidepressants to relax bladder muscles. ? Injections of botulinum toxin type A directly  into the bladder tissue to relax bladder muscles.  Lifestyle changes. This may include: ? Weight loss. Talk to your health care provider about weight loss methods that would work best for you. ? Diet changes. This may include  reducing how much alcohol and caffeine you consume, or drinking fluids at different times of the day. ? Not smoking. Do not use any products that contain nicotine or tobacco, such as cigarettes and e-cigarettes. If you need help quitting, ask your health care provider.  Surgery. ? A device may be implanted to help manage the nerve signals that control urination. ? An electrode may be implanted to stimulate electrical signals in the bladder. ? A procedure may be done to change the shape of the bladder. This is done only in very severe cases. Follow these instructions at home: Lifestyle  Make any diet or lifestyle changes that are recommended by your health care provider. These may include: ? Drinking less fluid or drinking fluids at different times of the day. ? Cutting down on caffeine or alcohol. ? Doing Kegel exercises. ? Losing weight if needed. ? Eating a healthy and balanced diet to prevent constipation. This may include:  Eating foods that are high in fiber, such as fresh fruits and vegetables, whole grains, and beans.  Limiting foods that are high in fat and processed sugars, such as fried and sweet foods. General instructions  Take over-the-counter and prescription medicines only as told by your health care provider.  If you were prescribed an antibiotic medicine, take it as told by your health care provider. Do not stop taking the antibiotic even if you start to feel better.  Use any implants or pessary as told by your health care provider.  If needed, wear pads to absorb urine leakage.  Keep a journal or log to track how much and when you drink and when you feel the need to urinate. This will help your health care provider monitor your condition.  Keep all follow-up visits as told by your health care provider. This is important. Contact a health care provider if:  You have a fever.  Your symptoms do not get better with treatment.  Your pain and discomfort get worse.   You have more frequent urges to urinate. Get help right away if:  You are not able to control your bladder. Summary  Overactive bladder refers to a condition in which a person has a sudden need to pass urine.  Several conditions may lead to an overactive bladder.  Treatment for overactive bladder depends on the cause and severity of your condition.  Follow your health care provider's instructions about lifestyle changes, doing Kegel exercises, keeping a journal, and taking medicines. This information is not intended to replace advice given to you by your health care provider. Make sure you discuss any questions you have with your health care provider. Document Released: 10/24/2008 Document Revised: 04/20/2018 Document Reviewed: 01/13/2017 Elsevier Patient Education  2020 [employer].

## 2018-08-24 LAB — URINALYSIS, COMPLETE
Bilirubin, UA: NEGATIVE
Glucose, UA: NEGATIVE
Nitrite, UA: POSITIVE — AB
Specific Gravity, UA: 1.025 (ref 1.005–1.030)
Urobilinogen, Ur: 0.2 mg/dL (ref 0.2–1.0)
pH, UA: 6.5 (ref 5.0–7.5)

## 2018-08-24 LAB — MICROSCOPIC EXAMINATION
RBC: NONE SEEN /hpf (ref 0–2)
WBC, UA: >30 /hpf — AB (ref 0–5)

## 2018-08-26 LAB — URINE CULTURE

## 2018-08-28 ENCOUNTER — Telehealth: Payer: Self-pay | Admitting: Urology

## 2018-08-28 NOTE — Telephone Encounter (Signed)
Pt. States she took her entire box of AZO and 6 days of Myrbetriq  And is having no relief of pain,burning and frequency. Pt. Has appointment on 09/20/18 with Dr. Junious Silk but thinks that is to long to wait. Pt states she has another box of Myrbetriq samples which include a 6 day supply. Please advise pt.

## 2018-08-28 NOTE — Telephone Encounter (Signed)
I see at her last visit there is a urine culture. Please review the culture and advise if she should add or change medication.

## 2018-08-30 ENCOUNTER — Telehealth: Payer: Self-pay | Admitting: Family Medicine

## 2018-08-30 MED ORDER — CIPROFLOXACIN HCL 250 MG PO TABS: 250.0000 mg | ORAL_TABLET | Freq: Two times a day (BID) | ORAL | 0 refills | Status: DC

## 2018-08-30 NOTE — Telephone Encounter (Signed)
Patient notified and ABX sent to pharmacy.  

## 2018-08-30 NOTE — Telephone Encounter (Signed)
-----   Message from Festus Aloe, MD sent at 08/29/2018  4:46 PM EDT ----- Morey Hummingbird - her culture is back - can you please send in Cipro 250 mg, one po BID, # 10, no refills -- thanks!  ----- Message ----- From: Kyra Manges, CMA Sent: 08/28/2018   8:10 AM EDT To: Festus Aloe, MD   ----- Message ----- From: Interface, [facility] Lab Results In Sent: 08/24/2018  11:37 AM EDT To: [facility]

## 2018-09-04 ENCOUNTER — Telehealth: Payer: Self-pay | Admitting: Urology

## 2018-09-04 NOTE — Telephone Encounter (Signed)
Pt had LMOM and Sam reviewed her message from the after hours nurse. She wants her to continue taking the Myrbetriq 25 mg and also she wants her to come drop off a urine if symptoms persist. Pt also states that she is about to rum out of Myrbetriq. Please advise.

## 2018-09-04 NOTE — Telephone Encounter (Signed)
Spoke to patient and a RX for Myrbetriq has been sent to pharmacy by Dr. Derrel Nip on 8/3. A appointment was scheduled for Wednesday.

## 2018-09-14 ENCOUNTER — Other Ambulatory Visit: Payer: Self-pay

## 2018-09-14 ENCOUNTER — Ambulatory Visit: Payer: PPO | Admitting: Urology

## 2018-09-14 ENCOUNTER — Ambulatory Visit
Admission: RE | Admit: 2018-09-14 | Discharge: 2018-09-14 | Disposition: A | Discharge status: Home / Self Care | Payer: PPO | Source: Ambulatory Visit | Attending: Urology | Admitting: Urology

## 2018-09-14 DIAGNOSIS — R3129 Other microscopic hematuria: Secondary | ICD-10-CM | POA: Insufficient documentation

## 2018-09-20 ENCOUNTER — Ambulatory Visit: Payer: PPO | Admitting: Urology

## 2018-09-20 ENCOUNTER — Other Ambulatory Visit: Payer: Self-pay

## 2018-09-20 ENCOUNTER — Other Ambulatory Visit: Payer: Self-pay | Admitting: Family Medicine

## 2018-09-20 ENCOUNTER — Encounter: Payer: Self-pay | Admitting: Urology

## 2018-09-20 VITALS — BP 129/66 | HR 76 | Ht 59.0 in | Wt 104.0 lb

## 2018-09-20 DIAGNOSIS — R3129 Other microscopic hematuria: Secondary | ICD-10-CM

## 2018-09-20 MED ORDER — MIRABEGRON ER 50 MG PO TB24: 50.0000 mg | ORAL_TABLET | Freq: Every day | ORAL | 11 refills | Status: DC

## 2018-09-20 NOTE — Progress Notes (Addendum)
   09/20/18  CC:  Chief Complaint  Patient presents with  . Cysto    HPI:  F/u - for cystoscopy and medication management. Urine cx + pseudomonas and she took Cipro.  She also started Myrbetriq 25 mg.  She did not feel like that was working so increase the dose to 50 mg and noticed a big improvement in the frequency, urgency and bladder pain. She also underwent renal/bladder US 09/14/2018 where a small mucosal nodule may have been seen in the bladder and a hypoechoic area adjacent to bladder possible ureterocele.   H/o dysuria and frequency going since June 2020. She drinks water and two cups of coffee. Also red wine at night. She has hesitancy. She had MH on UA 07/28/2018 with TNTC wbc. Urine culture was < 10k cfu. She tried antibiotics and Azo without relief.  She then was referred to urology and started on Ditropan XL along with cranberry and probiotic tablets. She is on HRT/TV estrogen. She tried Myrbetriq. No prolapse symptoms. Some constipation.   She underwent a CT scan of the abdomen and pelvis 02/21/2018 which showed a normal urinary tract. Her bladder was moderately distended.  Her creatinine was 0.59. Her PVR was 77 ml.   No GU history or surgery. NSVD x 7.     There were no vitals taken for this visit. NED. A&Ox3.   No respiratory distress   Abd soft, NT, ND Normal external genitalia with patent urethral meatus - severe atrophy Normal bladder and urethra on palpation Meatus normal   Chaperone-Sarah-for exam and cystoscopy  Cystoscopy Procedure Note  Patient identification was confirmed, informed consent was obtained, and patient was prepped using Betadine solution.  Lidocaine jelly was administered per urethral meatus.    Procedure: - Flexible cystoscope introduced, without any difficulty.   - Thorough search of the bladder revealed:    normal urethral meatus    normal urothelium    no stones    no ulcers     no tumors    no urethral polyps    no trabeculation   - Ureteral orifices were normal in position and appearance.  Post-Procedure: - Patient tolerated the procedure well  Assessment/ Plan:  Urgency, bladder pain -much better on Myrbetriq 50 mg.  I sent a prescription. Normal cysto - no mucosal lesion and UO's appeared normal without sign of ureterocele.    No follow-ups on file.  Festus Aloe, MD

## 2018-09-20 NOTE — Patient Instructions (Signed)

## 2018-09-21 LAB — URINALYSIS, COMPLETE
Bilirubin, UA: NEGATIVE
Glucose, UA: NEGATIVE
Ketones, UA: NEGATIVE
Nitrite, UA: NEGATIVE
Protein,UA: NEGATIVE
Specific Gravity, UA: 1.02 (ref 1.005–1.030)
Urobilinogen, Ur: 0.2 mg/dL (ref 0.2–1.0)
pH, UA: 5.5 (ref 5.0–7.5)

## 2018-09-21 LAB — MICROSCOPIC EXAMINATION

## 2018-09-28 ENCOUNTER — Other Ambulatory Visit: Payer: Self-pay

## 2018-09-28 MED ORDER — LEVOTHYROXINE SODIUM 50 MCG PO TABS: 50.0000 ug | ORAL_TABLET | Freq: Every day | ORAL | 0 refills | Status: DC

## 2018-09-29 ENCOUNTER — Other Ambulatory Visit: Payer: Self-pay

## 2018-09-29 ENCOUNTER — Other Ambulatory Visit: Payer: Self-pay | Admitting: Internal Medicine

## 2018-09-29 MED ORDER — AMLODIPINE BESYLATE 2.5 MG PO TABS: ORAL_TABLET | ORAL | 1 refills | Status: DC

## 2018-09-29 NOTE — Telephone Encounter (Signed)
Medication Refill - Medication: amLODipine (NORVASC) 2.5 MG tablet  Has the patient contacted their pharmacy? Yes - states they haven't heard back from [place].  States that she relies on her son to take her to the pharmacy and he will be in town today and wants to know if it can be sent right now. (Agent: If no, request that the patient contact the pharmacy for the refill.) (Agent: If yes, when and what did the pharmacy advise?)  Preferred Pharmacy (with phone number or street name):  [facility] [id number], [place] [phone] (Phone) [phone] (Fax)   Agent: Please be advised that RX refills may take up to 3 business days. We ask that you follow-up with your pharmacy.

## 2018-10-02 ENCOUNTER — Other Ambulatory Visit: Payer: Self-pay

## 2018-10-02 ENCOUNTER — Ambulatory Visit: Payer: PPO | Admitting: Physician Assistant

## 2018-10-02 ENCOUNTER — Encounter: Payer: Self-pay | Admitting: Physician Assistant

## 2018-10-02 VITALS — BP 130/87 | HR 91 | Ht 59.0 in | Wt 104.0 lb

## 2018-10-02 DIAGNOSIS — N952 Postmenopausal atrophic vaginitis: Secondary | ICD-10-CM

## 2018-10-02 DIAGNOSIS — R300 Dysuria: Secondary | ICD-10-CM | POA: Diagnosis not present

## 2018-10-02 LAB — MICROSCOPIC EXAMINATION: WBC, UA: >30 /hpf — AB (ref 0–5)

## 2018-10-02 LAB — URINALYSIS, COMPLETE
Bilirubin, UA: NEGATIVE
Glucose, UA: NEGATIVE
Nitrite, UA: POSITIVE — AB
Specific Gravity, UA: 1.025 (ref 1.005–1.030)
Urobilinogen, Ur: 1 mg/dL (ref 0.2–1.0)
pH, UA: 5.5 (ref 5.0–7.5)

## 2018-10-02 MED ORDER — ESTRADIOL 0.1 MG/GM VA CREA: 1.0000 | TOPICAL_CREAM | VAGINAL | 12 refills | Status: DC

## 2018-10-02 MED ORDER — CIPROFLOXACIN HCL 500 MG PO TABS: 500.0000 mg | ORAL_TABLET | Freq: Two times a day (BID) | ORAL | 0 refills | Status: DC

## 2018-10-02 NOTE — Patient Instructions (Addendum)
Recurrent UTI Prevention Strategies  1. Stay well hydrated. 2. Get a moderate amount of exercise. 3. Eat a diet rich in fruit and vegetables. 4. Start a bowel regimen to manage your constipation. Your goal is to have consistent, formed bowel movements that are easy for you to pass. You may use either of the over-the-counter supplements Benefiber or Miralax to help with this. I recommend that you try Benefiber first and move on to Miralax if this is not helping you enough. You may adjust the recommended dose of Miralax (one capful daily) to achieve this goal. 5. Continue vaginal estrogen cream. Apply a pea-sized amount around the urethra three times weekly.

## 2018-10-02 NOTE — Progress Notes (Signed)
10/02/2018 1:01 PM   Gloria Carr 1927/08/02 [id number]  CC: Dysuria   HPI: Gloria Carr is a 85 y.o. female who presents today for evaluation of possible UTI. She is an established [facility] patient who last saw Dr. Junious Silk on 09/20/2018 for cystoscopy with PMH dysuria and OAB on Myrbetriq 50mg  daily. No abnormalities seen at that time.  She reports a 1-week history of dysuria. She denies fevers, chills, nausea, vomiting, flank pain, and gross hematuria.   She did have a Pseudomonas UTI on 08/23/2018, treated with ciprofloxacin 250mg  BID x5 days.  She does not have a history of recurrent UTI. She takes Premarin estrogen cream vaginally. She reports constipation.  In-office UA today positive for 2+ blood, 2+ protein, nitrites, and 1+ leukocyte esterase; urine microscopy with >30 WBCs/HPF and many bacteria.  PMH: Past Medical History:  Diagnosis Date  . Allergy   . Arthritis   . Glaucoma   . History of chickenpox   . History of recurrent UTIs   . Hypertension   . Macular degeneration   . Thyroid disease     Surgical History: Past Surgical History:  Procedure Laterality Date  . BILATERAL CARPAL TUNNEL RELEASE  1999-2000  . CATARACT EXTRACTION, BILATERAL  2013    Home Medications:  Allergies as of 10/02/2018      Reactions   Sulfa Antibiotics Other (See Comments)   Childhood reaction       Medication List       Accurate as of October 02, 2018 11:59 PM. If you have any questions, ask your nurse or doctor.        amLODipine 2.5 MG tablet Commonly known as: NORVASC TAKE 1 TABLET(2.5 MG) BY MOUTH DAILY AT NIGHT FOR BLOOD PRESSURE   ciprofloxacin 500 MG tablet Commonly known as: Cipro Take 1 tablet (500 mg total) by mouth 2 (two) times daily for 7 days. Started by: Debroah Loop, PA-C   conjugated estrogens vaginal cream Commonly known as: PREMARIN Place a small amount around the urethra every night for two weeks,  THEN 3 TIMES WEEKLY   denosumab 60 MG/ML  Sosy injection Commonly known as: PROLIA Inject 60 mg into the skin every 6 (six) months.   estradiol 0.1 MG/GM vaginal cream Commonly known as: ESTRACE Place 1 Applicatorful vaginally 3 (three) times a week. Apply one pea-sized amount around the urethra 3 times weekly. Started by: Debroah Loop, PA-C   Flonase 50 MCG/ACT nasal spray Generic drug: fluticasone Place into both nostrils daily. Reported on 01/17/2015   latanoprost 0.005 % ophthalmic solution Commonly known as: XALATAN INSTILL 1 DROP IN THE LEFT EYE AT NIGHT   levothyroxine 50 MCG tablet Commonly known as: SYNTHROID Take 1 tablet (50 mcg total) by mouth daily.   meloxicam 7.5 MG tablet Commonly known as: MOBIC Take 1 tablet (7.5 mg total) by mouth daily. As needed for joint pain   mirabegron ER 50 MG Tb24 tablet Commonly known as: MYRBETRIQ Take 1 tablet (50 mg total) by mouth daily.   mirabegron ER 50 MG Tb24 tablet Commonly known as: MYRBETRIQ Take 1 tablet (50 mg total) by mouth daily.   OSTEO BI-FLEX JOINT SHIELD PO Take by mouth.   pravastatin 20 MG tablet Commonly known as: PRAVACHOL TAKE 1 TABLET(20 MG) BY MOUTH DAILY   PRESERVISION AREDS 2 PO Take by mouth.   CENTRUM SILVER PO Take by mouth.       Allergies:  Allergies  Allergen Reactions  . Sulfa Antibiotics Other (See Comments)  Childhood reaction     Family History: Family History  Problem Relation Age of Onset  . Stroke Mother   . Cancer Mother 39       colon  . Cancer Father        bone  . Cancer Son        Jaw  . Stroke Sister   . Breast cancer Neg Hx     Social History:   reports that she has quit smoking. She has never used smokeless tobacco. She reports current alcohol use of about 5.0 standard drinks of alcohol per week. She reports that she does not use drugs.  ROS: UROLOGY Frequent Urination?: Yes Hard to postpone urination?: Yes Burning/pain with urination?: Yes Get up at night to urinate?: Yes  Leakage of urine?: Yes Urine stream starts and stops?: No Trouble starting stream?: No Do you have to strain to urinate?: No Blood in urine?: No Urinary tract infection?: Yes Sexually transmitted disease?: No Injury to kidneys or bladder?: No Painful intercourse?: No Weak stream?: No Currently pregnant?: No Vaginal bleeding?: No Last menstrual period?: n  Gastrointestinal Nausea?: No Vomiting?: No Indigestion/heartburn?: No Diarrhea?: No Constipation?: Yes  Constitutional Fever: No Night sweats?: No Weight loss?: No Fatigue?: Yes  Skin Skin rash/lesions?: No Itching?: No  Eyes Blurred vision?: No Double vision?: No  Ears/Nose/Throat Sore throat?: No Sinus problems?: No  Hematologic/Lymphatic Swollen glands?: No Easy bruising?: No  Cardiovascular Leg swelling?: No Chest pain?: No  Respiratory Cough?: No Shortness of breath?: Yes  Endocrine Excessive thirst?: No  Musculoskeletal Back pain?: No Joint pain?: Yes  Neurological Headaches?: Yes Dizziness?: No  Psychologic Depression?: No Anxiety?: No  Physical Exam: BP 130/87 (BP Location: Left Arm, Patient Position: Sitting, Cuff Size: Normal)   Pulse 91   Ht 4\' 11"  (1.499 m)   Wt 104 lb (47.2 kg)   LMP  (LMP Unknown)   BMI 21.01 kg/m   Constitutional:  Alert and oriented, no acute distress, nontoxic appearing HEENT: [place], AT Cardiovascular: No clubbing, cyanosis, or edema Respiratory: Normal respiratory effort, no increased work of breathing Skin: No rashes, bruises or suspicious lesions Neurologic: Grossly intact, no focal deficits, moving all 4 extremities Psychiatric: Normal mood and affect  Laboratory Data: Results for orders placed or performed in visit on 10/02/18  Microscopic Examination   URINE  Result Value Ref Range   WBC, UA >30 (A) 0 - 5 /hpf   RBC 0-2 0 - 2 /hpf   Epithelial Cells (non renal) 0-10 0 - 10 /hpf   Bacteria, UA Many (A) None seen/Few  Urinalysis, Complete   Result Value Ref Range   Specific Gravity, UA 1.025 1.005 - 1.030   pH, UA 5.5 5.0 - 7.5   Color, UA Yellow Yellow   Appearance Ur Cloudy (A) Clear   Leukocytes,UA 1+ (A) Negative   Protein,UA 2+ (A) Negative/Trace   Glucose, UA Negative Negative   Ketones, UA Trace (A) Negative   RBC, UA 2+ (A) Negative   Bilirubin, UA Negative Negative   Urobilinogen, Ur 1.0 0.2 - 1.0 mg/dL   Nitrite, UA Positive (A) Negative   Microscopic Examination See below:    Assessment & Plan:   1. Dysuria Patient with symptoms and UA consistent with acute cystitis without hematuria.  Will send for culture.  Will start treatment today.  Patient also reports history of chronic constipation.  I advised her to initiate a bowel regimen of Benefiber or MiraLAX for management of this, explaining that bowel  and bladder dysfunction commonly influenza one another.  She expressed understanding. - Urinalysis, Complete - CULTURE, URINE COMPREHENSIVE - ciprofloxacin (CIPRO) 500 MG tablet; Take 1 tablet (500 mg total) by mouth 2 (two) times daily for 7 days.  Dispense: 14 tablet; Refill: 0  2. Atrophic vaginitis Patient requests refill of her topical estrogen cream.  She notes Premarin is very expensive.  I offered to switch her to Estrace today for possible cost savings.  She is in agreement with this plan. - estradiol (ESTRACE) 0.1 MG/GM vaginal cream; Place 1 Applicatorful vaginally 3 (three) times a week. Apply one pea-sized amount around the urethra 3 times weekly.  Dispense: 42.5 g; Refill: [place], PA-C  [facility] [address] [place], [place] [address] [phone]

## 2018-10-05 LAB — CULTURE, URINE COMPREHENSIVE

## 2018-10-06 ENCOUNTER — Other Ambulatory Visit: Payer: Self-pay

## 2018-10-06 ENCOUNTER — Other Ambulatory Visit: Payer: Self-pay | Admitting: Physician Assistant

## 2018-10-06 MED ORDER — AMOXICILLIN-POT CLAVULANATE 875-125 MG PO TABS: 1.0000 | ORAL_TABLET | Freq: Two times a day (BID) | ORAL | 0 refills | Status: AC

## 2018-10-09 ENCOUNTER — Other Ambulatory Visit: Payer: Self-pay

## 2018-10-09 ENCOUNTER — Ambulatory Visit (INDEPENDENT_AMBULATORY_CARE_PROVIDER_SITE_OTHER): Payer: PPO | Admitting: Internal Medicine

## 2018-10-09 ENCOUNTER — Encounter: Payer: Self-pay | Admitting: Internal Medicine

## 2018-10-09 VITALS — BP 130/70 | HR 84 | Temp 98.0°F | Resp 16 | Ht 59.0 in | Wt 104.4 lb

## 2018-10-09 DIAGNOSIS — I10 Essential (primary) hypertension: Secondary | ICD-10-CM

## 2018-10-09 DIAGNOSIS — R51 Headache: Secondary | ICD-10-CM

## 2018-10-09 DIAGNOSIS — M4802 Spinal stenosis, cervical region: Secondary | ICD-10-CM

## 2018-10-09 DIAGNOSIS — E7800 Pure hypercholesterolemia, unspecified: Secondary | ICD-10-CM

## 2018-10-09 DIAGNOSIS — R937 Abnormal findings on diagnostic imaging of other parts of musculoskeletal system: Secondary | ICD-10-CM

## 2018-10-09 DIAGNOSIS — E039 Hypothyroidism, unspecified: Secondary | ICD-10-CM | POA: Diagnosis not present

## 2018-10-09 DIAGNOSIS — R519 Headache, unspecified: Secondary | ICD-10-CM

## 2018-10-09 LAB — LIPID PANEL
Cholesterol: 143 mg/dL (ref 0–200)
HDL: 53.1 mg/dL (ref >39.00)
LDL Cholesterol: 66 mg/dL (ref 0–99)
NonHDL: 89.46
Total CHOL/HDL Ratio: 3
Triglycerides: 116 mg/dL (ref 0.0–149.0)
VLDL: 23.2 mg/dL (ref 0.0–40.0)

## 2018-10-09 LAB — CBC WITH DIFFERENTIAL/PLATELET
Basophils Absolute: 0.1 10*3/uL (ref 0.0–0.1)
Basophils Relative: 0.7 % (ref 0.0–3.0)
Eosinophils Absolute: 0.1 10*3/uL (ref 0.0–0.7)
Eosinophils Relative: 1.9 % (ref 0.0–5.0)
HCT: 41.6 % (ref 36.0–46.0)
Hemoglobin: 13.6 g/dL (ref 12.0–15.0)
Lymphocytes Relative: 32.5 % (ref 12.0–46.0)
Lymphs Abs: 2.4 10*3/uL (ref 0.7–4.0)
MCHC: 32.7 g/dL (ref 30.0–36.0)
MCV: 92.1 fl (ref 78.0–100.0)
Monocytes Absolute: 0.7 10*3/uL (ref 0.1–1.0)
Monocytes Relative: 9.9 % (ref 3.0–12.0)
Neutro Abs: 4 10*3/uL (ref 1.4–7.7)
Neutrophils Relative %: 55 % (ref 43.0–77.0)
Platelets: 391 10*3/uL (ref 150.0–400.0)
RBC: 4.52 Mil/uL (ref 3.87–5.11)
RDW: 13.4 % (ref 11.5–15.5)
WBC: 7.4 10*3/uL (ref 4.0–10.5)

## 2018-10-09 LAB — COMPREHENSIVE METABOLIC PANEL
ALT: 47 U/L — ABNORMAL HIGH (ref 0–35)
AST: 31 U/L (ref 0–37)
Albumin: 3.6 g/dL (ref 3.5–5.2)
Alkaline Phosphatase: 274 U/L — ABNORMAL HIGH (ref 39–117)
BUN: 13 mg/dL (ref 6–23)
CO2: 29 mEq/L (ref 19–32)
Calcium: 9.4 mg/dL (ref 8.4–10.5)
Chloride: 102 mEq/L (ref 96–112)
Creatinine, Ser: 0.69 mg/dL (ref 0.40–1.20)
GFR: 80.02 mL/min (ref >60.00)
Glucose, Bld: 121 mg/dL — ABNORMAL HIGH (ref 70–99)
Potassium: 4.1 mEq/L (ref 3.5–5.1)
Sodium: 140 mEq/L (ref 135–145)
Total Bilirubin: 0.6 mg/dL (ref 0.2–1.2)
Total Protein: 6.7 g/dL (ref 6.0–8.3)

## 2018-10-09 LAB — TSH: TSH: 1.89 u[IU]/mL (ref 0.35–4.50)

## 2018-10-09 LAB — SEDIMENTATION RATE: Sed Rate: 72 mm/hr — ABNORMAL HIGH (ref 0–30)

## 2018-10-09 MED ORDER — PREDNISONE 10 MG PO TABS: ORAL_TABLET | ORAL | 0 refills | Status: DC

## 2018-10-09 NOTE — Patient Instructions (Addendum)
I believe headache is due to degenerative changes of your cervical spine that were noted in 2019 on your MRI 's of brain and cervical spine (copies attached) AND have been aggravated by your PILLOW  You need a REAL pillow that provides cervical support   Continue tylenol , and I am adding prednisone as follows  6 tablets daily in the morning for the first 3 days starting on Tuesday ,  Then reduce the amount to 5 pills on day 4, and continue to taper by 1 tablet daily until gone  You will need to see a neurosurgeon.  I will set that up for you

## 2018-10-09 NOTE — Progress Notes (Signed)
Subjective:  Patient ID: Gloria Carr, female    DOB: 13-Jan-1927  Age: 85 y.o. MRN: [id number]  CC: The primary encounter diagnosis was Abnormal MRI, cervical spine. Diagnoses of Pure hypercholesterolemia, Essential hypertension, Acquired hypothyroidism, Left-sided headache, and Degenerative cervical spinal stenosis were also pertinent to this visit.  HPI Gloria Carr presents for left sided headache that has been constantly present for the past week . The headache starts behind her left ear and radiates to the top of her head  And is not relieved with tylenol.  She denies earache, sinus problems, dizziness,  Fevers.  She has chronic joint pain of hands,  Previously managed with osteo biflex which she stopped when she started taking  Prolia for osteoporosis due to a misunderstanding of the purpose of the drug,  She continues to work with a type of needlepoint and has lost no fine motor skills    She has a history of chronic headache with prior workup done and surgical intervention deferred due to spontaneously resolution of pain.  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    Multilevel spondylosis is worse at c4-5 flattening the cord and causing severe bilateral foraminal narrowing .  She has noted some numbness in both hands but denies weakness.   She deferred referral in October 2019  to neurosurgery because her headache resolved.  She did see Dr Lacinda Axon at [facility] clinic January 2020  Who recommended no surgery (a posterior  decompression and fusion would be needed ) given her advanced age and  lack of any symptoms suggesting myelopathy but recommended that she have flexion and extension films (done at [place]) and to follow up in one year..  She recalls him telling her "I don't manage headaches."  seeing [place] for recurrent UTI currently on  augmentin     Outpatient Medications Prior to Visit  Medication Sig Dispense Refill  . amLODipine (NORVASC) 2.5 MG tablet TAKE 1 TABLET(2.5 MG) BY MOUTH DAILY AT NIGHT FOR BLOOD PRESSURE 90 tablet 1  . amoxicillin-clavulanate (AUGMENTIN) 875-125 MG tablet Take 1 tablet by mouth 2 (two) times daily for 7 days. 14 tablet 0  . conjugated estrogens (PREMARIN) vaginal cream Place a small amount around the urethra every night for two weeks,  THEN 3 TIMES WEEKLY 42.5 g 12  . denosumab (PROLIA) 60 MG/ML SOSY injection Inject 60 mg into the skin every 6 (six) months.    Marland Kitchen estradiol (ESTRACE) 0.1 MG/GM vaginal cream Place 1 Applicatorful vaginally 3 (three) times a week. Apply one pea-sized amount around the urethra 3 times weekly. 42.5 g 12  . fluticasone (FLONASE) 50 MCG/ACT nasal spray Place into both nostrils daily. Reported on 01/17/2015    . latanoprost (XALATAN) 0.005 % ophthalmic solution INSTILL 1 DROP IN THE LEFT EYE AT NIGHT  5  . levothyroxine (SYNTHROID) 50 MCG tablet Take 1 tablet (50 mcg total) by mouth daily. 90 tablet 0  . meloxicam (MOBIC) 7.5 MG tablet Take 1 tablet (7.5  mg total) by mouth daily. As needed for joint pain 30 tablet 5  . mirabegron ER (MYRBETRIQ) 50 MG TB24 tablet Take 1 tablet (50 mg total) by mouth daily. 30 tablet 11  . mirabegron ER (MYRBETRIQ) 50 MG TB24 tablet Take 1 tablet (50 mg total) by mouth daily. 30 tablet 11  . Misc Natural Products (OSTEO BI-FLEX JOINT SHIELD PO) Take by mouth.    . Multiple Vitamins-Minerals (CENTRUM SILVER PO) Take by mouth.    . Multiple Vitamins-Minerals (PRESERVISION AREDS 2 PO) Take by mouth.    . pravastatin  (PRAVACHOL) 20 MG tablet TAKE 1 TABLET(20 MG) BY MOUTH DAILY 90 tablet 1  . ciprofloxacin (CIPRO) 500 MG tablet Take 1 tablet (500 mg total) by mouth 2 (two) times daily for 7 days. (Patient not taking: Reported on 10/09/2018) 14 tablet 0   No facility-administered medications prior to visit.     Review of Systems;  Patient denies, fevers, malaise, unintentional weight loss, skin rash, eye pain, sinus congestion and sinus pain, sore throat, dysphagia,  hemoptysis , cough, dyspnea, wheezing, chest pain, palpitations, orthopnea, edema, abdominal pain, nausea, melena, diarrhea, constipation, flank pain,hematuria,, nocturia, numbness, tingling, seizures,  Focal weakness, Loss of consciousness,  Tremor, insomnia, depression, anxiety, and suicidal ideation.      Objective:  BP 130/70 (BP Location: Left Arm, Patient Position: Sitting, Cuff Size: Normal)   Pulse 84   Temp 98 F (36.7 C) (Temporal)   Resp 16   Ht 4\' 11"  (1.499 m)   Wt 104 lb 6.4 oz (47.4 kg)   LMP  (LMP Unknown)   SpO2 97%   BMI 21.09 kg/m   BP Readings from Last 3 Encounters:  10/09/18 130/70  10/02/18 130/87  09/20/18 129/66    Wt Readings from Last 3 Encounters:  10/09/18 104 lb 6.4 oz (47.4 kg)  10/02/18 104 lb (47.2 kg)  09/20/18 104 lb (47.2 kg)    General appearance: alert, cooperative and appears stated age Ears: normal TM's and external ear canals both ears Throat: lips, mucosa, and tongue normal; teeth and gums normal Neck: no adenopathy, no carotid bruit, supple, symmetrical, trachea midline and thyroid not enlarged, symmetric, no tenderness/mass/nodules Back: symmetric, no curvature. ROM normal. No CVA tenderness. Lungs: clear to auscultation bilaterally Heart: regular rate and rhythm, S1, S2 normal, no murmur, click, rub or gallop Abdomen: soft, non-tender; bowel sounds normal; no masses,  no organomegaly Pulses: 2+ and symmetric Skin: Skin color, texture, turgor normal. No rashes or lesions Lymph  nodes: Cervical, supraclavicular, and axillary nodes normal.  Lab Results  Component Value Date   HGBA1C 5.9 01/26/2018   HGBA1C 5.7 07/16/2014    Lab Results  Component Value Date   CREATININE 0.69 10/09/2018   CREATININE 0.59 02/21/2018   CREATININE 0.82 12/21/2017    Lab Results  Component Value Date   WBC 7.4 10/09/2018   HGB 13.6 10/09/2018   HCT 41.6 10/09/2018   PLT 391.0 10/09/2018   GLUCOSE 121 (H) 10/09/2018   CHOL 143 10/09/2018   TRIG 116.0 10/09/2018   HDL 53.10 10/09/2018   LDLCALC 66 10/09/2018   ALT 47 (H) 10/09/2018   AST 31 10/09/2018   NA 140 10/09/2018   K 4.1 10/09/2018   CL 102 10/09/2018   CREATININE 0.69 10/09/2018   BUN 13 10/09/2018   CO2 29 10/09/2018   TSH 1.89 10/09/2018   HGBA1C 5.9 01/26/2018   MICROALBUR <0.7 11/25/2016    US Renal  Result Date: 09/15/2018 CLINICAL DATA:  Microscopic hematuria, history hypertension, recurrent UTIs, former smoker EXAM: RENAL / URINARY TRACT ULTRASOUND COMPLETE COMPARISON:  CT abdomen and pelvis 02/21/2018 FINDINGS: Right Kidney: Renal measurements: 9.9 x 3.8 x 4.5 cm = volume: 89 mL . Normal cortical thickness and echogenicity. No mass, hydronephrosis or shadowing calcification. Left Kidney: Renal measurements: 9.7 x 3.9 x 3.7 cm = volume: 66 mL. Normal cortical thickness and echogenicity. No mass, hydronephrosis or shadowing calcification. Bladder: Well distended. Questionable nodular focus along the superior wall 7 x 4 mm, polyp/tumor not excluded. Additionally, a small intraluminal septation is identified raising question of a RIGHT ureterocele. Additional small cystic collection is seen posterior to the LEFT bladder, 11 mm diameter, question LEFT ureterocele, small bladder diverticulum, unlikely distal LEFT ureteral dilatation or ovarian cyst. Incidentally noted small amount of nonspecific endometrial fluid up to 6 mm thick. IMPRESSION: Normal sonographic appearance of the kidneys bilaterally. Suspicion of a  RIGHT ureterocele with question LEFT ureterocele versus small bladder diverticulum as discussed above. Small nodular focus 7 x 4 mm identified within the bladder along the superior wall question polyp/tumor; correlation with cystoscopy recommended to exclude neoplasm. Small amount of nonspecific endometrial fluid of uncertain etiology. Electronically Signed   By: Lavonia Dana M.D.   On: 09/15/2018 08:34    Assessment & Plan:   Problem List Items Addressed This Visit      Unprioritized   Pure hypercholesterolemia   Relevant Orders   Lipid panel (Completed)   Essential hypertension   Relevant Orders   Comprehensive metabolic panel (Completed)   Acquired hypothyroidism   Relevant Orders   TSH (Completed)   Degenerative cervical spinal stenosis    Severe by 2019 MRI .  She has been seen b Dr Lacinda Axon  Jan 2020 with no surgical intervention planned.      Left-sided headache    Likely aggravated by use of cheap pillow..  Advised to obtain a true cervical support pillow..  Prednisone 60 mg x 3 then taper.  Continue tylenol  If no resolution refer back to Dr Lac[facility]on at [facility]       Other Visit Diagnoses    Abnormal MRI, cervical spine    -  Primary   Relevant Orders   Sedimentation rate (Completed)   Rheumatoid Arthritis Profile (Completed)   CBC with Differential/Platelet (Completed)      A total of 25 minutes of face to face time was spent with patient more than half of which was spent in counselling about the above mentioned conditions  and coordination of care  I have discontinued Albertha Virden's ciprofloxacin. I am also having her start on predniSONE. Additionally, I am having her maintain her Multiple Vitamins-Minerals (PRESERVISION AREDS 2 PO), Multiple Vitamins-Minerals (CENTRUM SILVER PO), Misc Natural Products (OSTEO BI-FLEX JOINT SHIELD PO), fluticasone, latanoprost, meloxicam, denosumab, conjugated estrogens, pravastatin, mirabegron ER, mirabegron ER, levothyroxine,  amLODipine, estradiol, and amoxicillin-clavulanate.  Meds ordered this encounter  Medications  . predniSONE (DELTASONE) 10 MG tablet    Sig: 6 tablets daily with breakfast for 3 days , then reduce by 1 tablet daily until gone    Dispense:  33 tablet    Refill:  0    Medications Discontinued During This Encounter  Medication Reason  . ciprofloxacin (CIPRO) 500 MG tablet Change in therapy    Follow-up: No follow-ups on file.   Crecencio Mc, MD

## 2018-10-10 DIAGNOSIS — R519 Headache, unspecified: Secondary | ICD-10-CM | POA: Insufficient documentation

## 2018-10-10 NOTE — Assessment & Plan Note (Signed)
Likely aggravated by use of cheap pillow..  Advised to obtain a true cervical support pillow..  Prednisone 60 mg x 3 then taper.  Continue tylenol  If no resolution refer back to Dr Lacinda Axon at [facility]

## 2018-10-10 NOTE — Assessment & Plan Note (Signed)
Severe by 2019 MRI .  She has been seen b Dr Lacinda Axon  Jan 2020 with no surgical intervention planned.

## 2018-10-11 ENCOUNTER — Telehealth: Payer: Self-pay | Admitting: *Deleted

## 2018-10-11 ENCOUNTER — Other Ambulatory Visit: Payer: Self-pay | Admitting: Internal Medicine

## 2018-10-11 DIAGNOSIS — R748 Abnormal levels of other serum enzymes: Secondary | ICD-10-CM

## 2018-10-11 LAB — RHEUMATOID ARTHRITIS PROFILE
Cyclic Citrullin Peptide Ab: 3 units (ref 0–19)
Rheumatoid fact SerPl-aCnc: 10.9 IU/mL (ref 0.0–13.9)

## 2018-10-11 NOTE — Progress Notes (Signed)
ruq us  

## 2018-10-11 NOTE — Telephone Encounter (Signed)
Copied from [place] [id number]. Topic: General - Other >> Oct 11, 2018  9:30 AM Yvette Rack wrote: Reason for CRM: Pt stated she needs to know where she can get the pillow that Dr. Derrel Nip told her to purchase. Pt requests call back.

## 2018-10-11 NOTE — Telephone Encounter (Signed)
She should be able to get it at [facility],   [place]  or [employer] , or any chiropractor's office   Ask for a cervical support pillow

## 2018-10-12 NOTE — Telephone Encounter (Signed)
Spoke with pt and informed of Dr. Lupita Dawn message below.

## 2018-10-13 ENCOUNTER — Ambulatory Visit: Payer: PPO

## 2018-10-16 ENCOUNTER — Ambulatory Visit
Admission: RE | Admit: 2018-10-16 | Discharge: 2018-10-16 | Disposition: A | Discharge status: Home / Self Care | Payer: PPO | Source: Ambulatory Visit | Attending: Internal Medicine | Admitting: Internal Medicine

## 2018-10-16 ENCOUNTER — Other Ambulatory Visit: Payer: Self-pay

## 2018-10-16 ENCOUNTER — Other Ambulatory Visit: Payer: Self-pay | Admitting: Internal Medicine

## 2018-10-16 DIAGNOSIS — R748 Abnormal levels of other serum enzymes: Secondary | ICD-10-CM | POA: Insufficient documentation

## 2018-10-16 DIAGNOSIS — R7989 Other specified abnormal findings of blood chemistry: Secondary | ICD-10-CM | POA: Diagnosis not present

## 2018-10-17 ENCOUNTER — Telehealth: Payer: Self-pay

## 2018-10-17 ENCOUNTER — Other Ambulatory Visit (INDEPENDENT_AMBULATORY_CARE_PROVIDER_SITE_OTHER): Payer: PPO

## 2018-10-17 ENCOUNTER — Other Ambulatory Visit: Payer: Self-pay

## 2018-10-17 DIAGNOSIS — R748 Abnormal levels of other serum enzymes: Secondary | ICD-10-CM | POA: Diagnosis not present

## 2018-10-17 LAB — COMPREHENSIVE METABOLIC PANEL
ALT: 21 U/L (ref 0–35)
AST: 16 U/L (ref 0–37)
Albumin: 3.9 g/dL (ref 3.5–5.2)
Alkaline Phosphatase: 141 U/L — ABNORMAL HIGH (ref 39–117)
BUN: 14 mg/dL (ref 6–23)
CO2: 30 mEq/L (ref 19–32)
Calcium: 9.8 mg/dL (ref 8.4–10.5)
Chloride: 99 mEq/L (ref 96–112)
Creatinine, Ser: 0.67 mg/dL (ref 0.40–1.20)
GFR: 82.78 mL/min (ref >60.00)
Glucose, Bld: 118 mg/dL — ABNORMAL HIGH (ref 70–99)
Potassium: 4 mEq/L (ref 3.5–5.1)
Sodium: 136 mEq/L (ref 135–145)
Total Bilirubin: 0.6 mg/dL (ref 0.2–1.2)
Total Protein: 6.7 g/dL (ref 6.0–8.3)

## 2018-10-17 NOTE — Telephone Encounter (Signed)
Copied from [place] [id number]. Topic: General - Other >> Oct 17, 2018 11:58 AM Leward Quan A wrote: Reason for CRM: Patient called to request a call back from [facility] Dr Derrel Nip assistant to discuss her seeing a Neurosurgeon. Can be reached at Ph# [phone]

## 2018-10-18 NOTE — Telephone Encounter (Signed)
She does not need a neurosurgery referral;   needs to return to Dr Lacinda Axon the neurosurgeon  she saw in  January at [facility].  He wanted her to follow up  If things got worse

## 2018-10-18 NOTE — Telephone Encounter (Signed)
I do not see in the chart where a referral to neurosurgery was placed.

## 2018-10-18 NOTE — Telephone Encounter (Signed)
Patient is calling again to check on referral to neurosurgeon.  Patient said that Dr. Derrel Nip was going to refer her to a neurosurgeon in [place].  Patient said that she has seen Dr. Lacinda Axon, a [place] neurosurgeon, before and would like to see him again.  Patient said that she called Dr. Jonathon Jordan office [facility] location) and was told that they would call her back to schedule an appt but pt still has not heard from them.  Patient wants to know the status of referral and c/o of having a headache daily rating the pain as 10 daily and says pain prevents her from sleeping.  Advised patient to call Dr. Jonathon Jordan office back to try to schedule again.  Also advised patient to go to urgent care if headaches become unbearable.  Informed pt that message will be forwarded to Janett Billow, Dr. Lupita Dawn assistant and the referral coordinator to check on status of referral.

## 2018-10-18 NOTE — Telephone Encounter (Signed)
Patient is checking status of when she will be getting a call back from [place].  Patient states she has been trying to get in touch with jessica since yesterday.  Patient states her head is hurting badly and now would like something for pain.  Call back # [phone]  [place], [place] - [place] [phone] (Phone) [phone] (Fax)

## 2018-10-18 NOTE — Telephone Encounter (Signed)
Patient calling to check status of getting a call back from [place]. States that she has called multiple times to get in touch with her. Please advise.

## 2018-10-19 NOTE — Telephone Encounter (Signed)
Spoke with pt last night letting her know that Dr. Derrel Nip stated that she has seen Dr. Lacinda Axon at [facility] and that he wanted her to follow up if things got worse. Explained to pt that she did not need a referral since she had already seen him, that all she would need to do is call and schedule an appt. Pt stated that she did call them and she has an appt on October 13th. Pt wasn't sure that she could wait until then because her head was in so much pain that Tylenol was not helping with the pain. I advised pt to go to the ED if the pain was that bad. Pt stated that she did not want to go to the ED because she lives at the [facility] and she would have to quarantine when she got back home. Pt stated that she was going to give it another day or two and if did not get any better then she would just have to go to the ED. Dr. Derrel Nip was verbally made aware of this last night after I got off the phone with pt.

## 2018-10-24 DIAGNOSIS — M542 Cervicalgia: Secondary | ICD-10-CM | POA: Diagnosis not present

## 2018-10-24 DIAGNOSIS — M5481 Occipital neuralgia: Secondary | ICD-10-CM | POA: Diagnosis not present

## 2018-11-07 ENCOUNTER — Ambulatory Visit (INDEPENDENT_AMBULATORY_CARE_PROVIDER_SITE_OTHER): Payer: PPO | Admitting: *Deleted

## 2018-11-07 ENCOUNTER — Other Ambulatory Visit: Payer: Self-pay

## 2018-11-07 DIAGNOSIS — M8000XS Age-related osteoporosis with current pathological fracture, unspecified site, sequela: Secondary | ICD-10-CM

## 2018-11-07 MED ORDER — DENOSUMAB 60 MG/ML ~~LOC~~ SOSY
60.0000 mg | PREFILLED_SYRINGE | Freq: Once | SUBCUTANEOUS | Status: AC
  Administered 2018-11-07: 12:00:00 60 mg via SUBCUTANEOUS

## 2018-11-07 NOTE — Progress Notes (Addendum)
Patient presented for Prolia injection to left arm [place], patient voiced no concerns or complaints during or after injection.  Reviewed.  Dr Nicki Reaper

## 2018-11-10 ENCOUNTER — Other Ambulatory Visit: Payer: Self-pay | Admitting: Internal Medicine

## 2018-11-13 DIAGNOSIS — D2272 Melanocytic nevi of left lower limb, including hip: Secondary | ICD-10-CM | POA: Diagnosis not present

## 2018-11-13 DIAGNOSIS — L821 Other seborrheic keratosis: Secondary | ICD-10-CM | POA: Diagnosis not present

## 2018-11-13 DIAGNOSIS — D225 Melanocytic nevi of trunk: Secondary | ICD-10-CM | POA: Diagnosis not present

## 2018-11-13 DIAGNOSIS — D2261 Melanocytic nevi of right upper limb, including shoulder: Secondary | ICD-10-CM | POA: Diagnosis not present

## 2018-11-13 DIAGNOSIS — D692 Other nonthrombocytopenic purpura: Secondary | ICD-10-CM | POA: Diagnosis not present

## 2018-11-13 DIAGNOSIS — D2262 Melanocytic nevi of left upper limb, including shoulder: Secondary | ICD-10-CM | POA: Diagnosis not present

## 2018-11-13 DIAGNOSIS — D2271 Melanocytic nevi of right lower limb, including hip: Secondary | ICD-10-CM | POA: Diagnosis not present

## 2018-11-15 ENCOUNTER — Telehealth: Payer: Self-pay | Admitting: Internal Medicine

## 2018-11-15 NOTE — Telephone Encounter (Signed)
Copied from [place] [id number]. Topic: Quick Communication - Rx Refill/Question >> Nov 15, 2018 10:08 AM Yvette Rack wrote: Medication: traMADol (ULTRAM) 50 MG tablet  Has the patient contacted their pharmacy? no  Preferred Pharmacy (with phone number or street name): [place], [place] - [address] [phone] (Phone)  [phone] (Fax)  Agent: Please be advised that RX refills may take up to 3 business days. We ask that you follow-up with your pharmacy.

## 2018-11-16 MED ORDER — TRAMADOL HCL 50 MG PO TABS: 50.0000 mg | ORAL_TABLET | Freq: Two times a day (BID) | ORAL | 2 refills | Status: DC

## 2018-11-16 NOTE — Telephone Encounter (Signed)
Your tramadol has been refilled  Regards,   Deborra Medina, MD

## 2018-11-16 NOTE — Telephone Encounter (Signed)
Last OV 10/09/18 last refill 07/21/17 takes for sciatica flares.

## 2018-11-17 ENCOUNTER — Other Ambulatory Visit: Payer: Self-pay

## 2018-11-17 DIAGNOSIS — R2689 Other abnormalities of gait and mobility: Secondary | ICD-10-CM | POA: Diagnosis not present

## 2018-11-17 DIAGNOSIS — M5431 Sciatica, right side: Secondary | ICD-10-CM | POA: Diagnosis not present

## 2018-11-17 DIAGNOSIS — M6281 Muscle weakness (generalized): Secondary | ICD-10-CM | POA: Diagnosis not present

## 2018-11-17 MED ORDER — TRAMADOL HCL 50 MG PO TABS: 50.0000 mg | ORAL_TABLET | Freq: Two times a day (BID) | ORAL | 2 refills | Status: DC

## 2018-11-17 NOTE — Telephone Encounter (Signed)
[facility] stated they did not receive fax for tramadol rx. Requesting it be resent electronically or verbal as soon as possible. Deliveries are going out at 2:00pm. Please advise. No answer on line [facility] - [place], [place] - [place] [phone] (Phone) [phone] (Fax)

## 2018-11-17 NOTE — Telephone Encounter (Signed)
[facility]eled [facility] to [facility]

## 2018-11-17 NOTE — Telephone Encounter (Signed)
[place], [place] - [place] [phone] (Phone) [phone] (Fax)      Patient states this medication was sent to wrong pharmacy. She is requesting this be corrected before 2:00, today, if possible

## 2018-11-17 NOTE — Telephone Encounter (Signed)
[facility] called stating the prescription was sent to wrong pharmacy.  [facility].  [facility] would like to know if they can cancel this and send it to their pharmacy  [facility] [phone]

## 2018-11-17 NOTE — Addendum Note (Signed)
Addended by: Nanci Pina on: 11/17/2018 10:37 AM   Modules accepted: Orders

## 2018-11-17 NOTE — Telephone Encounter (Signed)
Rx faxed to [facility].  Patient is aware.

## 2018-11-17 NOTE — Telephone Encounter (Signed)
Pt following up on request.  Advised waiting on Dr Derrel Nip to sign and would fax to [facility] after that

## 2018-11-24 DIAGNOSIS — M5431 Sciatica, right side: Secondary | ICD-10-CM | POA: Diagnosis not present

## 2018-11-24 DIAGNOSIS — M6281 Muscle weakness (generalized): Secondary | ICD-10-CM | POA: Diagnosis not present

## 2018-11-24 DIAGNOSIS — R2689 Other abnormalities of gait and mobility: Secondary | ICD-10-CM | POA: Diagnosis not present

## 2018-11-27 DIAGNOSIS — R2689 Other abnormalities of gait and mobility: Secondary | ICD-10-CM | POA: Diagnosis not present

## 2018-11-27 DIAGNOSIS — M5431 Sciatica, right side: Secondary | ICD-10-CM | POA: Diagnosis not present

## 2018-11-27 DIAGNOSIS — M6281 Muscle weakness (generalized): Secondary | ICD-10-CM | POA: Diagnosis not present

## 2018-11-30 DIAGNOSIS — R2689 Other abnormalities of gait and mobility: Secondary | ICD-10-CM | POA: Diagnosis not present

## 2018-11-30 DIAGNOSIS — M5431 Sciatica, right side: Secondary | ICD-10-CM | POA: Diagnosis not present

## 2018-11-30 DIAGNOSIS — M6281 Muscle weakness (generalized): Secondary | ICD-10-CM | POA: Diagnosis not present

## 2018-12-20 ENCOUNTER — Encounter: Payer: Self-pay | Admitting: Internal Medicine

## 2018-12-20 ENCOUNTER — Other Ambulatory Visit: Payer: Self-pay

## 2018-12-20 ENCOUNTER — Ambulatory Visit (INDEPENDENT_AMBULATORY_CARE_PROVIDER_SITE_OTHER): Payer: PPO | Admitting: Internal Medicine

## 2018-12-20 DIAGNOSIS — M5430 Sciatica, unspecified side: Secondary | ICD-10-CM

## 2018-12-20 DIAGNOSIS — R519 Headache, unspecified: Secondary | ICD-10-CM

## 2018-12-20 DIAGNOSIS — R748 Abnormal levels of other serum enzymes: Secondary | ICD-10-CM

## 2018-12-20 DIAGNOSIS — M4802 Spinal stenosis, cervical region: Secondary | ICD-10-CM

## 2018-12-20 NOTE — Progress Notes (Signed)
Telephone Note  This visit type was conducted due to national recommendations for restrictions regarding the COVID-19 pandemic (e.g. social distancing).  This format is felt to be most appropriate for this patient at this time.  All issues noted in this document were discussed and addressed.  No physical exam was performed (except for noted visual exam findings with Video Visits).   I connected with@ on 12/20/18 at 11:00 AM EST by  telephone and verified that I am speaking with the correct person using two identifiers. Location patient: home Location provider: work or home office Persons participating in the virtual visit: patient, provider  I discussed the limitations, risks, security and privacy concerns of performing an evaluation and management service by telephone and the availability of in person appointments. I also discussed with the patient that there may be a patient responsible charge related to this service. The patient expressed understanding and agreed to proceed.  Reason for visit: follow up on sciatica left sided secondary to spinal stenosis   HPI:   85 yr old with cervical and lumbar  spinal stenosis with persistent left sided scalp pain  referred to Dr Lacinda Axon  In October 2020 who did not recommend surgery or gabapentin,  But referred her to Dr Manuella Ghazi for treatment of occipital neuralgia .Marland Kitchen  She has not seen Dr Manuella Ghazi yey  Cc: let sided buttock pain.  No longer radiates down leg.  No onger payig for PT , exercises at [place] to improved strength of leg muscles.  appt with shah was cancelled on day of appt and rescheduled for February,  occiptal pain resolved  Taking trmaodl bid.    Elevated alk phos noted in late sept,.  Normal [place].  Repeat level much better . Had taken cipro on sept 21 for recurrent uti      ROS: See pertinent positives and negatives per HPI.  Past Medical History:  Diagnosis Date  . Allergy   . Arthritis   . Glaucoma   . History of chickenpox   . History of  recurrent UTIs   . Hypertension   . Macular degeneration   . Thyroid disease     Past Surgical History:  Procedure Laterality Date  . BILATERAL CARPAL TUNNEL RELEASE  1999-2000  . CATARACT EXTRACTION, BILATERAL  2013    Family History  Problem Relation Age of Onset  . Stroke Mother   . Cancer Mother 66       colon  . Cancer Father        bone  . Cancer Son        Jaw  . Stroke Sister   . Breast cancer Neg Hx     SOCIAL HX:  reports that she has quit smoking. She has never used smokeless tobacco. She reports current alcohol use of about 5.0 standard drinks of alcohol per week. She reports that she does not use drugs.   Current Outpatient Medications:  .  amLODipine (NORVASC) 2.5 MG tablet, TAKE 1 TABLET(2.5 MG) BY MOUTH DAILY AT NIGHT FOR BLOOD PRESSURE, Disp: 90 tablet, Rfl: 1 .  conjugated estrogens (PREMARIN) vaginal cream, Place a small amount around the urethra every night for two weeks,  THEN 3 TIMES WEEKLY, Disp: 42.5 g, Rfl: 12 .  denosumab (PROLIA) 60 MG/ML SOSY injection, Inject 60 mg into the skin every 6 (six) months., Disp: , Rfl:  .  estradiol (ESTRACE) 0.1 MG/GM vaginal cream, Place 1 Applicatorful vaginally 3 (three) times a week. Apply one pea-sized amount around  the urethra 3 times weekly., Disp: 42.5 g, Rfl: 12 .  fluticasone (FLONASE) 50 MCG/ACT nasal spray, Place into both nostrils daily. Reported on 01/17/2015, Disp: , Rfl:  .  latanoprost (XALATAN) 0.005 % ophthalmic solution, INSTILL 1 DROP IN THE LEFT EYE AT NIGHT, Disp: , Rfl: 5 .  levothyroxine (SYNTHROID) 50 MCG tablet, Take 1 tablet (50 mcg total) by mouth daily., Disp: 90 tablet, Rfl: 0 .  meloxicam (MOBIC) 7.5 MG tablet, Take 1 tablet (7.5 mg total) by mouth daily. As needed for joint pain, Disp: 30 tablet, Rfl: 5 .  mirabegron ER (MYRBETRIQ) 50 MG TB24 tablet, Take 1 tablet (50 mg total) by mouth daily., Disp: 30 tablet, Rfl: 11 .  mirabegron ER (MYRBETRIQ) 50 MG TB24 tablet, Take 1 tablet (50 mg  total) by mouth daily., Disp: 30 tablet, Rfl: 11 .  Misc Natural Products (OSTEO BI-FLEX JOINT SHIELD PO), Take by mouth., Disp: , Rfl:  .  Multiple Vitamins-Minerals (CENTRUM SILVER PO), Take by mouth., Disp: , Rfl:  .  Multiple Vitamins-Minerals (PRESERVISION AREDS 2 PO), Take by mouth., Disp: , Rfl:  .  pravastatin (PRAVACHOL) 20 MG tablet, TAKE ONE TABLET BY MOUTH EVERY DAY, Disp: 90 tablet, Rfl: 1 .  traMADol (ULTRAM) 50 MG tablet, Take 1 tablet (50 mg total) by mouth 2 (two) times daily. As needed for moderate pain due to sciatica, Disp: 60 tablet, Rfl: 2  EXAM:    General impression: alert, cooperative and articulate.  No signs of being in distress  Lungs: speech is fluent sentence length suggests that patient is not short of breath and not punctuated by cough, sneezing or sniffing. Marland Kitchen   Psych: affect normal.  speech is articulate and non pressured .  Denies suicidal thoughts   ASSESSMENT AND PLAN:  Discussed the following assessment and plan:  Degenerative cervical spinal stenosis  Left-sided headache  Sciatica, unspecified laterality  Elevated alkaline phosphatase level  Degenerative cervical spinal stenosis Medical management recommended by Neurosurgery.  Neurology referral made for presumed occipital neuralgia (per Dr Lacinda Axon) but delayed; in the interim her occipital pain has become insignificant with use of tramadol .  Advised to suspend tramadol and if pain is present will pursue referral  Left-sided headache Seen by Neurosurgery,  Presumed occipital neuralgia.  Awaiting evaluation pending status f headache intensity  without tramadol   Sciatica S/p PT referral , which she deferred after several visits .  Back extension exercise demonstrated , advised to try the exercise for one week and update me on the improvement.   Elevated alkaline phosphatase level Improved upon repeat.  Ultrasound of gallbladder was normal.     I discussed the assessment and treatment plan  with the patient. The patient was provided an opportunity to ask questions and all were answered. The patient agreed with the plan and demonstrated an understanding of the instructions.   The patient was advised to call back or seek an in-person evaluation if the symptoms worsen or if the condition fails to improve as anticipated.   I provided  22 minutes of non-face-to-face time during this encounter reviewing patient's current problems and past procedures/imaging studies, providing counseling on the above mentioned problems , and coordination  of care .  Crecencio Mc, MD

## 2018-12-21 DIAGNOSIS — M5430 Sciatica, unspecified side: Secondary | ICD-10-CM | POA: Insufficient documentation

## 2018-12-21 DIAGNOSIS — R748 Abnormal levels of other serum enzymes: Secondary | ICD-10-CM | POA: Insufficient documentation

## 2018-12-21 NOTE — Assessment & Plan Note (Addendum)
Medical management recommended by Neurosurgery.  Neurology referral made for presumed occipital neuralgia (per Dr Lacinda Axon) but delayed; in the interim her occipital pain has become insignificant with use of tramadol .  Advised to suspend tramadol and if pain is present will pursue referral

## 2018-12-21 NOTE — Assessment & Plan Note (Signed)
S/p PT referral , which she deferred after several visits .  Back extension exercise demonstrated , advised to try the exercise for one week and update me on the improvement.

## 2018-12-21 NOTE — Assessment & Plan Note (Signed)
Improved upon repeat.  Ultrasound of gallbladder was normal.

## 2018-12-21 NOTE — Assessment & Plan Note (Signed)
Seen by Neurosurgery,  Presumed occipital neuralgia.  Awaiting evaluation pending status f headache intensity  without tramadol

## 2018-12-22 ENCOUNTER — Telehealth: Payer: Self-pay | Admitting: *Deleted

## 2018-12-22 NOTE — Telephone Encounter (Signed)
Spoke with patient and she is scheduled for AWV on 12/25/18 @ 12:00. Agrees to complete the appointment.

## 2018-12-22 NOTE — Telephone Encounter (Signed)
Copied from [place] [id number]. Topic: General - Other >> Dec 22, 2018  1:40 PM Rainey Pines A wrote: Patient called to inform Dr. Derrel Nip that she can get in to neuorlogist Dec 30 now  and no longer needs a new neurologist referral to be placed. Patient would also like a callback in regards to AWV scheduled 12/14

## 2018-12-22 NOTE — Telephone Encounter (Signed)
Is someone going to call her back regarding visit with denisa? Yes I think she should have it.

## 2018-12-25 ENCOUNTER — Other Ambulatory Visit: Payer: Self-pay

## 2018-12-25 ENCOUNTER — Ambulatory Visit: Payer: PPO | Admitting: Internal Medicine

## 2018-12-25 ENCOUNTER — Ambulatory Visit (INDEPENDENT_AMBULATORY_CARE_PROVIDER_SITE_OTHER): Payer: PPO

## 2018-12-25 DIAGNOSIS — Z0000 Encounter for general adult medical examination without abnormal findings: Secondary | ICD-10-CM | POA: Diagnosis not present

## 2018-12-25 NOTE — Patient Instructions (Addendum)
  Ms. Volk , Thank you for taking time to come for your Medicare Wellness Visit. I appreciate your ongoing commitment to your health goals. Please review the following plan we discussed and let me know if I can assist you in the future.   These are the goals we discussed: Goals    . Follow up with Provider as scheduled       This is a list of the screening recommended for you and due dates:  Health Maintenance  Topic Date Due  . Tetanus Vaccine  12/10/2025  . Flu Shot  Completed  . DEXA scan (bone density measurement)  Completed  . Pneumonia vaccines  Completed

## 2018-12-25 NOTE — Progress Notes (Signed)
Subjective:   Gloria Carr is a 85 y.o. female who presents for Medicare Annual (Subsequent) preventive examination.  Review of Systems:  No ROS.  Medicare Wellness Virtual Visit.  Visual/audio telehealth visit, UTA vital signs.   See social history for additional risk factors.   Cardiac Risk Factors include: advanced age (>95men, >36 women);hypertension     Objective:     Vitals: LMP  (LMP Unknown)   There is no height or weight on file to calculate BMI.  Advanced Directives 12/25/2018 02/21/2018 12/21/2017 12/09/2016 12/10/2015 12/10/2014  Does Patient Have a Medical Advance Directive? Yes No Yes Yes Yes Yes  Type of Paramedic of [place];Living will - [place];Living will [place];Living will Living will;Healthcare Power of [place];Living will  Does patient want to make changes to medical advance directive? No - Patient declined - No - Patient declined No - Patient declined - -  Copy of [place] in Chart? Yes - validated most recent copy scanned in chart (See row information) - Yes - validated most recent copy scanned in chart (See row information) No - copy requested No - copy requested No - copy requested  Would patient like information on creating a medical advance directive? - No - Patient declined - - - -    Tobacco Social History   Tobacco Use  Smoking Status Former Smoker  Smokeless Tobacco Never Used     Counseling given: Not Answered   Clinical Intake:  Pre-visit preparation completed: Yes        Diabetes: No  How often do you need to have someone help you when you read instructions, pamphlets, or other written materials from your doctor or pharmacy?: 1 - Never  Interpreter Needed?: No     Past Medical History:  Diagnosis Date  . Allergy   . Arthritis   . Glaucoma   . History of chickenpox   . History of recurrent UTIs   .  Hypertension   . Macular degeneration   . Thyroid disease    Past Surgical History:  Procedure Laterality Date  . BILATERAL CARPAL TUNNEL RELEASE  1999-2000  . CATARACT EXTRACTION, BILATERAL  2013   Family History  Problem Relation Age of Onset  . Stroke Mother   . Cancer Mother 77       colon  . Cancer Father        bone  . Cancer Son        Jaw  . Stroke Sister   . Breast cancer Neg Hx    Social History   Socioeconomic History  . Marital status: Widowed    Spouse name: Not on file  . Number of children: Not on file  . Years of education: Not on file  . Highest education level: Not on file  Occupational History  . Not on file  Tobacco Use  . Smoking status: Former Research scientist (life sciences)  . Smokeless tobacco: Never Used  Substance and Sexual Activity  . Alcohol use: Yes    Alcohol/week: 5.0 standard drinks    Types: 5 Standard drinks or equivalent per week    Comment: with dinner  . Drug use: No  . Sexual activity: Never  Other Topics Concern  . Not on file  Social History Narrative  . Not on file   Social Determinants of Health   Financial Resource Strain:   . Difficulty of Paying Living Expenses: Not on file  Food  Insecurity:   . Worried About Charity fundraiser in the Last Year: Not on file  . Ran Out of Food in the Last Year: Not on file  Transportation Needs:   . Lack of Transportation (Medical): Not on file  . Lack of Transportation (Non-Medical): Not on file  Physical Activity:   . Days of Exercise per Week: Not on file  . Minutes of Exercise per Session: Not on file  Stress:   . Feeling of Stress : Not on file  Social Connections:   . Frequency of Communication with Friends and Family: Not on file  . Frequency of Social Gatherings with Friends and Family: Not on file  . Attends Religious Services: Not on file  . Active Member of Clubs or Organizations: Not on file  . Attends Archivist Meetings: Not on file  . Marital Status: Not on file     Outpatient Encounter Medications as of 12/25/2018  Medication Sig  . amLODipine (NORVASC) 2.5 MG tablet TAKE 1 TABLET(2.5 MG) BY MOUTH DAILY AT NIGHT FOR BLOOD PRESSURE  . conjugated estrogens (PREMARIN) vaginal cream Place a small amount around the urethra every night for two weeks,  THEN 3 TIMES WEEKLY  . denosumab (PROLIA) 60 MG/ML SOSY injection Inject 60 mg into the skin every 6 (six) months.  Marland Kitchen estradiol (ESTRACE) 0.1 MG/GM vaginal cream Place 1 Applicatorful vaginally 3 (three) times a week. Apply one pea-sized amount around the urethra 3 times weekly.  . fluticasone (FLONASE) 50 MCG/ACT nasal spray Place into both nostrils daily. Reported on 01/17/2015  . latanoprost (XALATAN) 0.005 % ophthalmic solution INSTILL 1 DROP IN THE LEFT EYE AT NIGHT  . levothyroxine (SYNTHROID) 50 MCG tablet Take 1 tablet (50 mcg total) by mouth daily.  . meloxicam (MOBIC) 7.5 MG tablet Take 1 tablet (7.5 mg total) by mouth daily. As needed for joint pain  . mirabegron ER (MYRBETRIQ) 50 MG TB24 tablet Take 1 tablet (50 mg total) by mouth daily.  . mirabegron ER (MYRBETRIQ) 50 MG TB24 tablet Take 1 tablet (50 mg total) by mouth daily.  . Misc Natural Products (OSTEO BI-FLEX JOINT SHIELD PO) Take by mouth.  . Multiple Vitamins-Minerals (CENTRUM SILVER PO) Take by mouth.  . Multiple Vitamins-Minerals (PRESERVISION AREDS 2 PO) Take by mouth.  . pravastatin (PRAVACHOL) 20 MG tablet TAKE ONE TABLET BY MOUTH EVERY DAY  . traMADol (ULTRAM) 50 MG tablet Take 1 tablet (50 mg total) by mouth 2 (two) times daily. As needed for moderate pain due to sciatica   No facility-administered encounter medications on file as of 12/25/2018.    Activities of Daily Living In your present state of health, do you have any difficulty performing the following activities: 12/25/2018  Hearing? N  Vision? Y  Comment Macular degeneration  Difficulty concentrating or making decisions? N  Walking or climbing stairs? N  Dressing or  bathing? N  Doing errands, shopping? Y  Comment She does not Physiological scientist and eating ? N  Using the Toilet? N  In the past six months, have you accidently leaked urine? Y  Comment Followed by Urologist. Managed with daily pad.  Do you have problems with loss of bowel control? N  Managing your Medications? N  Managing your Finances? N  Housekeeping or managing your Housekeeping? N  Some recent data might be hidden    Patient Care Team: Crecencio Mc, MD as PCP - General (Internal Medicine)    Assessment:   This  is a routine wellness examination for [place].  Nurse connected with patient 12/25/18 at 12:00 PM EST by a telephone enabled telemedicine application and verified that I am speaking with the correct person using two identifiers. Patient stated full name and DOB. Patient gave permission to continue with virtual visit. Patient's location was at home and Nurse's location was at [place] office.   Patient is alert and oriented x3. Patient denies difficulty focusing or concentrating. Difficulty remembering names.  Recall 5/5 using an address. Correctly stated the months of the year in reverse.  Patient likes to needle working and participates in social activities using Covid safety precautions for brain stimulation.  Health Maintenance Due: See completed HM at the end of note.   Eye: Visual acuity not assessed. Virtual visit. Followed by her ophthalmologist. Macular degeneration.   Dental: Visits every 12 months.   Partial plate; lower  Hearing: Demonstrates normal hearing during visit.  Safety:  Patient feels safe at home- yes Patient does have smoke detectors at home- yes Patient does wear sunscreen or protective clothing when in direct sunlight - yes Patient does wear seat belt when in a moving vehicle - yes Patient drives- no. Son drives her to medical appointments.  Adequate lighting in walkways free from debris- yes Grab bars and handrails used as  appropriate- yes Ambulates with an assistive device- no Cell phone on person when ambulating outside of the home- yes  Social: Alcohol intake - yes      Smoking history- former   Smokers in home? none Illicit drug use? none  Medication: Taking as directed and without issues.  Pill box in use -yes  Self managed - yes   Covid-19: Precautions and sickness symptoms discussed. Wears mask, social distancing, hand hygiene as appropriate.   Activities of Daily Living Patient denies needing assistance with: household chores, feeding themselves, getting from bed to chair, getting to the toilet, bathing/showering, dressing, managing money, or preparing meals.   Discussed the importance of a healthy diet, water intake and the benefits of aerobic exercise.   Physical activity- balance exercises, post physical therapy floor exercises. Walking to the mail box.   Diet:  Regular Water: good intake Caffeine: 1 cup of coffee  Other Providers Patient Care Team: Crecencio Mc, MD as PCP - General (Internal Medicine)  Exercise Activities and Dietary recommendations Current Exercise Habits: Home exercise routine, Type of exercise: walking(post physical therapy exercises, balancing exercises), Time (Minutes): 10, Intensity: Mild  Goals    . Follow up with Provider as scheduled       Fall Risk Fall Risk  12/25/2018 12/20/2018 10/09/2018 07/31/2018 12/09/2016  Falls in the past year? 0 0 1 1 Yes  Comment - - - - -  Number falls in past yr: - - 0 0 1  Injury with Fall? - - 1 0 Yes  Follow up Falls prevention discussed Falls evaluation completed Falls evaluation completed Falls evaluation completed Falls prevention discussed;Education provided   Timed Get Up and Go performed: no, virtual visit  Depression Screen PHQ 2/9 Scores 12/25/2018 07/31/2018 12/21/2017 12/09/2016  PHQ - 2 Score 0 0 0 0  PHQ- 9 Score - 0 - -     Cognitive Function MMSE - Mini Mental State Exam 12/09/2016 12/10/2014    Orientation to time 5 5  Orientation to Place 5 5  Registration 3 3  Attention/ Calculation 5 5  Recall 2 3  Language- name 2 objects 2 2  Language- repeat 1 1  Language-  follow 3 step command 3 3  Language- read & follow direction 1 1  Write a sentence 1 1  Copy design 1 1  Total score 29 30     6CIT Screen 12/25/2018 12/21/2017 12/10/2015  What Year? 0 points 0 points 0 points  What month? 0 points 0 points 0 points  What time? 0 points 0 points 0 points  Count back from 20 0 points 0 points 0 points  Months in reverse 0 points 0 points 0 points  Repeat phrase 0 points 0 points 2 points  Total Score 0 0 2    Immunization History  Administered Date(s) Administered  . Influenza, High Dose Seasonal PF 10/16/2015, 10/31/2017  . Influenza-Unspecified 09/12/2014, 09/27/2016, 11/08/2018  . Pneumococcal Conjugate-13 01/17/2015  . Pneumococcal Polysaccharide-23 01/17/2011  . Td 12/11/2015  . Tdap 12/11/2015, 12/11/2015  . Zoster 07/15/2012   Screening Tests Health Maintenance  Topic Date Due  . TETANUS/TDAP  12/10/2025  . INFLUENZA VACCINE  Completed  . DEXA SCAN  Completed  . PNA vac Low Risk Adult  Completed      Plan:   Keep all routine maintenance appointments.   Follow up with your doctor 04/10/19 @ 1:00  Medicare Attestation I have personally reviewed: The patient's medical and social history Their use of alcohol, tobacco or illicit drugs Their current medications and supplements The patient's functional ability including ADLs,fall risks, home safety risks, cognitive, and hearing and visual impairment Diet and physical activities Evidence for depression   I have reviewed and discussed with patient certain preventive protocols, quality metrics, and best practice recommendations.   Varney Biles, LPN  [id number]

## 2018-12-29 ENCOUNTER — Telehealth: Payer: Self-pay | Admitting: Internal Medicine

## 2018-12-29 MED ORDER — LEVOTHYROXINE SODIUM 50 MCG PO TABS: 50.0000 ug | ORAL_TABLET | Freq: Every day | ORAL | 0 refills | Status: DC

## 2018-12-29 NOTE — Telephone Encounter (Signed)
Medication has been sent to [facility].

## 2018-12-29 NOTE — Telephone Encounter (Signed)
Pt switch pharmacy's, going to [facility]. Pharmacy called and does not have a refill on her levothyroxine (SYNTHROID) 50 MCG tablet,  Needs to have one on file, please.

## 2019-01-02 DIAGNOSIS — H353122 Nonexudative age-related macular degeneration, left eye, intermediate dry stage: Secondary | ICD-10-CM | POA: Diagnosis not present

## 2019-01-02 DIAGNOSIS — H401121 Primary open-angle glaucoma, left eye, mild stage: Secondary | ICD-10-CM | POA: Diagnosis not present

## 2019-01-10 DIAGNOSIS — E538 Deficiency of other specified B group vitamins: Secondary | ICD-10-CM | POA: Diagnosis not present

## 2019-01-10 DIAGNOSIS — G609 Hereditary and idiopathic neuropathy, unspecified: Secondary | ICD-10-CM | POA: Diagnosis not present

## 2019-01-10 DIAGNOSIS — M5481 Occipital neuralgia: Secondary | ICD-10-CM | POA: Diagnosis not present

## 2019-01-10 DIAGNOSIS — R7303 Prediabetes: Secondary | ICD-10-CM | POA: Diagnosis not present

## 2019-01-10 DIAGNOSIS — E519 Thiamine deficiency, unspecified: Secondary | ICD-10-CM | POA: Diagnosis not present

## 2019-01-10 DIAGNOSIS — E559 Vitamin D deficiency, unspecified: Secondary | ICD-10-CM | POA: Diagnosis not present

## 2019-01-10 DIAGNOSIS — M5030 Other cervical disc degeneration, unspecified cervical region: Secondary | ICD-10-CM | POA: Diagnosis not present

## 2019-01-10 DIAGNOSIS — E531 Pyridoxine deficiency: Secondary | ICD-10-CM | POA: Diagnosis not present

## 2019-01-17 ENCOUNTER — Telehealth: Payer: Self-pay | Admitting: Internal Medicine

## 2019-01-17 NOTE — Telephone Encounter (Signed)
Pt is concerned about taking Tramadol. She got the covid vaccine today and wants to know if there are any drug interactions. Please advise

## 2019-01-18 NOTE — Telephone Encounter (Signed)
Spoke with pt and informed her that there is no known interactions between tramadol and the covid vaccine. Pt gave a verbal understanding.

## 2019-01-18 NOTE — Telephone Encounter (Signed)
THERE ARE NO KNOWN  interactions.  With tramadol and covid vACCINE,

## 2019-01-26 DIAGNOSIS — M5481 Occipital neuralgia: Secondary | ICD-10-CM | POA: Insufficient documentation

## 2019-01-29 DIAGNOSIS — M62830 Muscle spasm of back: Secondary | ICD-10-CM | POA: Diagnosis not present

## 2019-01-29 DIAGNOSIS — M5136 Other intervertebral disc degeneration, lumbar region: Secondary | ICD-10-CM | POA: Diagnosis not present

## 2019-01-29 DIAGNOSIS — M5416 Radiculopathy, lumbar region: Secondary | ICD-10-CM | POA: Diagnosis not present

## 2019-02-09 DIAGNOSIS — M5481 Occipital neuralgia: Secondary | ICD-10-CM | POA: Diagnosis not present

## 2019-02-09 NOTE — Telephone Encounter (Signed)
Pt called in wants to know if you still want her to continue taking the Tramadol. She said it is not helping much and she still has pain in her butt. She doesn't want to renew prescription if its not going to help. Pt wants to know if there is anything else you can do or should she just continue with Tramadol?

## 2019-02-10 NOTE — Telephone Encounter (Signed)
Her choice.  If she would like a stronger medication she will need to make an appointment

## 2019-02-12 NOTE — Telephone Encounter (Signed)
Spoke with pt and informed her of her of Dr. Lupita Dawn message. Pt stated that she does not think that she needs anything stronger and that she will keep taking the one tramadol a day for now.

## 2019-02-14 DIAGNOSIS — M5481 Occipital neuralgia: Secondary | ICD-10-CM | POA: Diagnosis not present

## 2019-03-14 DIAGNOSIS — M5416 Radiculopathy, lumbar region: Secondary | ICD-10-CM | POA: Diagnosis not present

## 2019-03-14 DIAGNOSIS — M5136 Other intervertebral disc degeneration, lumbar region: Secondary | ICD-10-CM | POA: Diagnosis not present

## 2019-03-26 ENCOUNTER — Telehealth: Payer: Self-pay | Admitting: Internal Medicine

## 2019-03-26 NOTE — Telephone Encounter (Signed)
Prolia scheduled.  

## 2019-03-30 ENCOUNTER — Other Ambulatory Visit: Payer: Self-pay

## 2019-03-30 ENCOUNTER — Encounter: Payer: Self-pay | Admitting: Urology

## 2019-03-30 ENCOUNTER — Other Ambulatory Visit: Payer: Self-pay | Admitting: Internal Medicine

## 2019-03-30 ENCOUNTER — Ambulatory Visit: Payer: PPO | Admitting: Urology

## 2019-03-30 VITALS — BP 162/74 | HR 78 | Ht 59.0 in | Wt 104.0 lb

## 2019-03-30 DIAGNOSIS — R350 Frequency of micturition: Secondary | ICD-10-CM | POA: Diagnosis not present

## 2019-03-30 DIAGNOSIS — Z87448 Personal history of other diseases of urinary system: Secondary | ICD-10-CM | POA: Diagnosis not present

## 2019-03-30 LAB — URINALYSIS, COMPLETE
Bilirubin, UA: NEGATIVE
Glucose, UA: NEGATIVE
Ketones, UA: NEGATIVE
Nitrite, UA: NEGATIVE
Protein,UA: NEGATIVE
RBC, UA: NEGATIVE
Specific Gravity, UA: 1.025 (ref 1.005–1.030)
Urobilinogen, Ur: 0.2 mg/dL (ref 0.2–1.0)
pH, UA: 6.5 (ref 5.0–7.5)

## 2019-03-30 LAB — MICROSCOPIC EXAMINATION: RBC, Urine: NONE SEEN /hpf (ref 0–2)

## 2019-03-30 NOTE — Progress Notes (Signed)
03/30/2019 11:01 AM   Gloria Carr Jun 08, 1927 [id number]  Referring provider: Crecencio Mc, MD [address],  [place] [address]  Chief Complaint  Patient presents with  . Over Active Bladder  . Follow-up    HPI:  F/u - H/o dysuria and frequencygoing since June 2020. She drinks water and two cups of coffee. Also red wine at night. She has hesitancy. She had MH on UA 07/28/2018 with TNTC wbc as well. Urine culture was < 10k cfu.She tried antibiotics and Azo without relief. She then was referred to urology and started on Ditropan XL along with cranberry and probiotic tablets. She is on HRT/TV estrogen - now Estrace. She tried Myrbetriq. No prolapse symptoms. Some constipation.No GU history or surgery. NSVD x 7.  She underwent a CT scan of the abdomen and pelvis 02/21/2018 which showed a normal urinary tract.Her bladder was moderately distended.Her creatinine was 0.59. Her PVR was 77 ml.   Myrbetriq increased to 50 mg and she noticed a big improvement in the frequency, urgency and bladder pain. She also underwent renal/bladder US 09/14/2018 where a small mucosal nodule may have been seen in the bladder and a hypoechoic area adjacent to bladder possible ureterocele. Cystoscopy was negative and benign Sep 2020.   She continues Myrbetriq 50 mg and takes it in the mornings. UA today with a few bacteria. No hematuria. No dysuria. Noc x 1-2. She also c/o loose stools and bowel urgency. She takes immodium.   PMH: Past Medical History:  Diagnosis Date  . Allergy   . Arthritis   . Glaucoma   . History of chickenpox   . History of recurrent UTIs   . Hypertension   . Macular degeneration   . Thyroid disease     Surgical History: Past Surgical History:  Procedure Laterality Date  . BILATERAL CARPAL TUNNEL RELEASE  1999-2000  . CATARACT EXTRACTION, BILATERAL  2013    Home Medications:  Allergies as of 03/30/2019      Reactions   Sulfa Antibiotics Other (See  Comments)   Childhood reaction       Medication List       Accurate as of March 30, 2019 11:01 AM. If you have any questions, ask your nurse or doctor.        STOP taking these medications   conjugated estrogens vaginal cream Commonly known as: PREMARIN Stopped by: Festus Aloe, MD   meloxicam 7.5 MG tablet Commonly known as: MOBIC Stopped by: Festus Aloe, MD   traMADol 50 MG tablet Commonly known as: ULTRAM Stopped by: Festus Aloe, MD     TAKE these medications   amLODipine 2.5 MG tablet Commonly known as: NORVASC TAKE 1 TABLET(2.5 MG) BY MOUTH DAILY AT NIGHT FOR BLOOD PRESSURE   Cholecalciferol 25 MCG (1000 UT) capsule Take by mouth.   denosumab 60 MG/ML Sosy injection Commonly known as: PROLIA Inject 60 mg into the skin every 6 (six) months.   estradiol 0.1 MG/GM vaginal cream Commonly known as: ESTRACE Place 1 Applicatorful vaginally 3 (three) times a week. Apply one pea-sized amount around the urethra 3 times weekly.   Flonase 50 MCG/ACT nasal spray Generic drug: fluticasone Place into both nostrils daily. Reported on 01/17/2015   latanoprost 0.005 % ophthalmic solution Commonly known as: XALATAN INSTILL 1 DROP IN THE LEFT EYE AT NIGHT   levothyroxine 50 MCG tablet Commonly known as: SYNTHROID Take 1 tablet (50 mcg total) by mouth daily.   mirabegron ER 50 MG  Tb24 tablet Commonly known as: MYRBETRIQ Take 1 tablet (50 mg total) by mouth daily.   OSTEO BI-FLEX JOINT SHIELD PO Take by mouth.   pravastatin 20 MG tablet Commonly known as: PRAVACHOL TAKE ONE TABLET BY MOUTH EVERY DAY   PRESERVISION AREDS 2 PO Take by mouth.   CENTRUM SILVER PO Take by mouth.       Allergies:  Allergies  Allergen Reactions  . Sulfa Antibiotics Other (See Comments)    Childhood reaction     Family History: Family History  Problem Relation Age of Onset  . Stroke Mother   . Cancer Mother 56       colon  . Cancer Father        bone  .  Cancer Son        Jaw  . Stroke Sister   . Breast cancer Neg Hx     Social History:  reports that she has quit smoking. She has never used smokeless tobacco. She reports current alcohol use of about 5.0 standard drinks of alcohol per week. She reports that she does not use drugs.   Physical Exam: BP (!) 162/74 (BP Location: Left Arm, Patient Position: Sitting, Cuff Size: Normal)   Pulse 78   Ht 4\' 11"  (1.499 m)   Wt 104 lb (47.2 kg)   LMP  (LMP Unknown)   BMI 21.01 kg/m   Constitutional:  Alert and oriented, No acute distress. HEENT: [place] AT, moist mucus membranes.  Trachea midline, no masses. Cardiovascular: No clubbing, cyanosis, or edema. Respiratory: Normal respiratory effort, no increased work of breathing. GI: Abdomen is soft, nontender, nondistended, no abdominal masses GU: No CVA tenderness Skin: No rashes, bruises or suspicious lesions. Neurologic: Grossly intact, no focal deficits, moving all 4 extremities. Psychiatric: Normal mood and affect.  Laboratory Data: Lab Results  Component Value Date   WBC 7.4 10/09/2018   HGB 13.6 10/09/2018   HCT 41.6 10/09/2018   MCV 92.1 10/09/2018   PLT 391.0 10/09/2018    Lab Results  Component Value Date   CREATININE 0.67 10/17/2018    No results found for: PSA  No results found for: TESTOSTERONE  Lab Results  Component Value Date   HGBA1C 5.9 01/26/2018    Urinalysis    Component Value Date/Time   APPEARANCEUR Cloudy (A) 10/02/2018 1306   GLUCOSEU Negative 10/02/2018 1306   BILIRUBINUR Negative 10/02/2018 1306   PROTEINUR 2+ (A) 10/02/2018 1306   UROBILINOGEN 0.2 04/13/2017 1357   NITRITE Positive (A) 10/02/2018 1306   LEUKOCYTESUR 1+ (A) 10/02/2018 1306    Lab Results  Component Value Date   LABMICR See below: 10/02/2018   WBCUA >30 (A) 10/02/2018   LABEPIT 0-10 10/02/2018   BACTERIA Many (A) 10/02/2018    Pertinent Imaging: n/a  No results found for this or any previous visit. No results found for  this or any previous visit. No results found for this or any previous visit. No results found for this or any previous visit. Results for orders placed during the hospital encounter of 09/14/18  US RENAL   Narrative CLINICAL DATA:  Microscopic hematuria, history hypertension, recurrent UTIs, former smoker  EXAM: RENAL / URINARY TRACT ULTRASOUND COMPLETE  COMPARISON:  CT abdomen and pelvis 02/21/2018  FINDINGS: Right Kidney:  Renal measurements: 9.9 x 3.8 x 4.5 cm = volume: 89 mL . Normal cortical thickness and echogenicity. No mass, hydronephrosis or shadowing calcification.  Left Kidney:  Renal measurements: 9.7 x 3.9 x 3.7 cm = volume:  66 mL. Normal cortical thickness and echogenicity. No mass, hydronephrosis or shadowing calcification.  Bladder:  Well distended. Questionable nodular focus along the superior wall 7 x 4 mm, polyp/tumor not excluded. Additionally, a small intraluminal septation is identified raising question of a RIGHT ureterocele. Additional small cystic collection is seen posterior to the LEFT bladder, 11 mm diameter, question LEFT ureterocele, small bladder diverticulum, unlikely distal LEFT ureteral dilatation or ovarian cyst.  Incidentally noted small amount of nonspecific endometrial fluid up to 6 mm thick.  IMPRESSION: Normal sonographic appearance of the kidneys bilaterally.  Suspicion of a RIGHT ureterocele with question LEFT ureterocele versus small bladder diverticulum as discussed above.  Small nodular focus 7 x 4 mm identified within the bladder along the superior wall question polyp/tumor; correlation with cystoscopy recommended to exclude neoplasm.  Small amount of nonspecific endometrial fluid of uncertain etiology.   Electronically Signed   By: Lavonia Dana M.D.   On: 09/15/2018 08:34    No results found for this or any previous visit. No results found for this or any previous visit. No results found for this or any previous  visit.  Assessment & Plan:    1. Frequency - she will take Myrbetriq at supper and see if it gives better relief of nocturia. Could consider desmopressin or Vesicare with the bowel urgency.  - Urinalysis, Complete   No follow-ups on file.  Festus Aloe, MD  [facility] [address] [place], [place] [address] [phone]

## 2019-03-30 NOTE — Patient Instructions (Signed)

## 2019-04-10 ENCOUNTER — Ambulatory Visit (INDEPENDENT_AMBULATORY_CARE_PROVIDER_SITE_OTHER): Payer: PPO | Admitting: Internal Medicine

## 2019-04-10 ENCOUNTER — Other Ambulatory Visit: Payer: Self-pay

## 2019-04-10 ENCOUNTER — Encounter: Payer: Self-pay | Admitting: Internal Medicine

## 2019-04-10 VITALS — BP 118/62 | HR 69 | Temp 97.8°F | Resp 15 | Ht 59.0 in | Wt 97.8 lb

## 2019-04-10 DIAGNOSIS — M810 Age-related osteoporosis without current pathological fracture: Secondary | ICD-10-CM

## 2019-04-10 DIAGNOSIS — R7303 Prediabetes: Secondary | ICD-10-CM | POA: Diagnosis not present

## 2019-04-10 DIAGNOSIS — M5481 Occipital neuralgia: Secondary | ICD-10-CM

## 2019-04-10 DIAGNOSIS — I10 Essential (primary) hypertension: Secondary | ICD-10-CM | POA: Diagnosis not present

## 2019-04-10 DIAGNOSIS — E559 Vitamin D deficiency, unspecified: Secondary | ICD-10-CM | POA: Diagnosis not present

## 2019-04-10 DIAGNOSIS — R748 Abnormal levels of other serum enzymes: Secondary | ICD-10-CM

## 2019-04-10 DIAGNOSIS — M4802 Spinal stenosis, cervical region: Secondary | ICD-10-CM | POA: Diagnosis not present

## 2019-04-10 DIAGNOSIS — M48061 Spinal stenosis, lumbar region without neurogenic claudication: Secondary | ICD-10-CM | POA: Diagnosis not present

## 2019-04-10 DIAGNOSIS — R351 Nocturia: Secondary | ICD-10-CM

## 2019-04-10 DIAGNOSIS — E039 Hypothyroidism, unspecified: Secondary | ICD-10-CM | POA: Diagnosis not present

## 2019-04-10 DIAGNOSIS — G609 Hereditary and idiopathic neuropathy, unspecified: Secondary | ICD-10-CM | POA: Diagnosis not present

## 2019-04-10 LAB — COMPREHENSIVE METABOLIC PANEL
ALT: 15 U/L (ref 0–35)
AST: 19 U/L (ref 0–37)
Albumin: 4 g/dL (ref 3.5–5.2)
Alkaline Phosphatase: 49 U/L (ref 39–117)
BUN: 14 mg/dL (ref 6–23)
CO2: 32 mEq/L (ref 19–32)
Calcium: 9.3 mg/dL (ref 8.4–10.5)
Chloride: 104 mEq/L (ref 96–112)
Creatinine, Ser: 0.65 mg/dL (ref 0.40–1.20)
GFR: 85.63 mL/min (ref >60.00)
Glucose, Bld: 85 mg/dL (ref 70–99)
Potassium: 3.9 mEq/L (ref 3.5–5.1)
Sodium: 139 mEq/L (ref 135–145)
Total Bilirubin: 0.6 mg/dL (ref 0.2–1.2)
Total Protein: 6.7 g/dL (ref 6.0–8.3)

## 2019-04-10 LAB — VITAMIN D 25 HYDROXY (VIT D DEFICIENCY, FRACTURES): VITD: 42.72 ng/mL (ref 30.00–100.00)

## 2019-04-10 LAB — HEMOGLOBIN A1C: Hgb A1c MFr Bld: 5.7 % (ref 4.6–6.5)

## 2019-04-10 LAB — TSH: TSH: 2.25 u[IU]/mL (ref 0.35–4.50)

## 2019-04-10 NOTE — Patient Instructions (Addendum)
If you want to gain weight:  Stop snacking on fruit! add more protein and fat!  Try Snacking  on the Ensure (350 cal ,  20 g protein ) instead of an orange   Try spreading peanut butter  Or sliced cheese  on apple slices

## 2019-04-10 NOTE — Assessment & Plan Note (Signed)
Bilateral ESI by [facility] in March helped  But she has episodic pain  Brought on by sitting for long periods of time.

## 2019-04-10 NOTE — Progress Notes (Signed)
Subjective:  Patient ID: Gloria Carr, female    DOB: [birth year]  Age: 86 y.o. MRN: [id number]  CC: The primary encounter diagnosis was Vitamin D deficiency. Diagnoses of Age-related osteoporosis without current pathological fracture, Bilateral occipital neuralgia, Spinal stenosis of lumbar region without neurogenic claudication, Nocturia, Degenerative cervical spinal stenosis, Essential hypertension, Prediabetes, Acquired hypothyroidism, Hereditary and idiopathic peripheral neuropathy, and Elevated alkaline phosphatase level were also pertinent to this visit.  HPI [name] presents for 6 month follow up  This visit occurred during the SARS-CoV-2 public health emergency.  Safety protocols were in place, including screening questions prior to the visit, additional usage of staff PPE, and extensive cleaning of exam room while observing appropriate contact time as indicated for disinfecting solutions.    Patient has received both doses of the [place] 19 vaccine without complications.  Patient continues to mask when outside of the home except when walking in yard or at safe distances from others .  Patient denies any change in mood or development of unhealthy behaviors resuting from the pandemic's restriction of activities and socialization.    "Feeling old":  Reviewed her list of ailments:  Urinary urgency now taking myrbetriq.  Recurrent diarrhea due to food intolerances hsNot line dancing anymore due to peripheral neuropathy and COVID  Losing vision due to macular denegeration and glaucoma  Weight loss  Bothers her.  Good appetite but fills up on fruit, cereal ,  Not much protein or fat.   Seeing [facility] for sciatica , had her first ESI which helped   Osteoporosis taking 50 mcg vitamin D and getting Prolia every 6 montsh   Kids coming to celebrate for 89th birthday next week  [place], [place]   Outpatient Medications Prior to Visit  Medication Sig  Dispense Refill  . amLODipine (NORVASC) 2.5 MG tablet TAKE 1 TABLET(2.5 MG) BY MOUTH DAILY AT NIGHT FOR BLOOD PRESSURE 90 tablet 1  . Cholecalciferol 25 MCG (1000 UT) capsule Take by mouth.    . denosumab (PROLIA) 60 MG/ML SOSY injection Inject 60 mg into the skin every 6 (six) months.    . fluticasone (FLONASE) 50 MCG/ACT nasal spray Place into both nostrils daily. Reported on 01/17/2015    . latanoprost (XALATAN) 0.005 % ophthalmic solution INSTILL 1 DROP IN THE LEFT EYE AT NIGHT  5  . levothyroxine (SYNTHROID) 50 MCG tablet TAKE 1 TABLET EVERY DAY ON EMPTY STOMACHWITH A GLASS OF WATER AT LEAST 30-60 MINBEFORE BREAKFAST 90 tablet 0  . mirabegron ER (MYRBETRIQ) 50 MG TB24 tablet Take 1 tablet (50 mg total) by mouth daily. 30 tablet 11  . Misc Natural Products (OSTEO BI-FLEX JOINT SHIELD PO) Take by mouth.    . Multiple Vitamins-Minerals (CENTRUM SILVER PO) Take by mouth.    . Multiple Vitamins-Minerals (PRESERVISION AREDS 2 PO) Take by mouth.    . pravastatin (PRAVACHOL) 20 MG tablet TAKE ONE TABLET BY MOUTH EVERY DAY 90 tablet 1  . estradiol (ESTRACE) 0.1 MG/GM vaginal cream Place 1 Applicatorful vaginally 3 (three) times a week. Apply one pea-sized amount around the urethra 3 times weekly. (Patient not taking: Reported on 03/30/2019) 42.5 g 12   No facility-administered medications prior to visit.    Review of Systems;  Patient denies headache, fevers, malaise, unintentional weight loss, skin rash, eye pain, sinus congestion and sinus pain, sore throat, dysphagia,  hemoptysis , cough, dyspnea, wheezing, chest pain, palpitations, orthopnea, edema, abdominal pain, nausea, melena, diarrhea, constipation, flank pain,  dysuria, hematuria, urinary  Frequency, nocturia, numbness, tingling, seizures,  Focal weakness, Loss of consciousness,  Tremor, insomnia, depression, anxiety, and suicidal ideation.      Objective:  BP 118/62 (BP Location: Left Arm, Patient Position: Sitting, Cuff Size: Normal)    Pulse 69   Temp 97.8 F (36.6 C) (Temporal)   Resp 15   Ht 4\' 11"  (1.499 m)   Wt 97 lb 12.8 oz (44.4 kg)   LMP  (LMP Unknown)   SpO2 96%   BMI 19.75 kg/m   BP Readings from Last 3 Encounters:  04/10/19 118/62  03/30/19 (!) 162/74  10/09/18 130/70    Wt Readings from Last 3 Encounters:  04/10/19 97 lb 12.8 oz (44.4 kg)  03/30/19 104 lb (47.2 kg)  12/20/18 104 lb (47.2 kg)    General appearance: alert, cooperative and appears stated age Ears: normal TM's and external ear canals both ears Throat: lips, mucosa, and tongue normal; teeth and gums normal Neck: no adenopathy, no carotid bruit, supple, symmetrical, trachea midline and thyroid not enlarged, symmetric, no tenderness/mass/nodules Back: symmetric, no curvature. ROM normal. No CVA tenderness. Lungs: clear to auscultation bilaterally Heart: regular rate and rhythm, S1, S2 normal, no murmur, click, rub or gallop Abdomen: soft, non-tender; bowel sounds normal; no masses,  no organomegaly Pulses: 2+ and symmetric Skin: Skin color, texture, turgor normal. No rashes or lesions Lymph nodes: Cervical, supraclavicular, and axillary nodes normal.  Lab Results  Component Value Date   HGBA1C 5.7 04/10/2019   HGBA1C 5.9 01/26/2018   HGBA1C 5.7 07/16/2014    Lab Results  Component Value Date   CREATININE 0.65 04/10/2019   CREATININE 0.67 10/17/2018   CREATININE 0.69 10/09/2018    Lab Results  Component Value Date   WBC 7.4 10/09/2018   HGB 13.6 10/09/2018   HCT 41.6 10/09/2018   PLT 391.0 10/09/2018   GLUCOSE 85 04/10/2019   CHOL 143 10/09/2018   TRIG 116.0 10/09/2018   HDL 53.10 10/09/2018   LDLCALC 66 10/09/2018   ALT 15 04/10/2019   AST 19 04/10/2019   NA 139 04/10/2019   K 3.9 04/10/2019   CL 104 04/10/2019   CREATININE 0.65 04/10/2019   BUN 14 04/10/2019   CO2 32 04/10/2019   TSH 2.25 04/10/2019   HGBA1C 5.7 04/10/2019   MICROALBUR <0.7 11/25/2016     Assessment & Plan:   Problem List Items  Addressed This Visit      Unprioritized   Acquired hypothyroidism    Thyroid function is WNL on current dose.  No current changes needed.   Lab Results  Component Value Date   TSH 2.25 04/10/2019         Relevant Orders   TSH (Completed)   Hereditary and idiopathic peripheral neuropathy    She has had no appreciable improvement in symptoms with cymbalta  Denies pain.        Osteoporosis    With history of pelvic fractures. Continue Prolia,  Last prolia injection Nov 07 2018       Vitamin D deficiency - Primary   Relevant Orders   VITAMIN D 25 Hydroxy (Vit-D Deficiency, Fractures) (Completed)   Essential hypertension   Relevant Orders   Comprehensive metabolic panel (Completed)   Lumbar spinal stenosis    Bilateral ESI by [facility] in March helped  But she has episodic pain  Brought on by sitting for long periods of time.       Degenerative cervical spinal stenosis    Causing  occipital headaches, now resolved since Dr Manuella Ghazi gave her an injection       Prediabetes   Relevant Orders   Hemoglobin A1c (Completed)   Elevated alkaline phosphatase level    Remains resolved. Marland Kitchen  Ultrasound of gallbladder was normal.   Lab Results  Component Value Date   ALT 15 04/10/2019   AST 19 04/10/2019   ALKPHOS 49 04/10/2019   BILITOT 0.6 04/10/2019         Bilateral occipital neuralgia    Seeing Neurology,  Dr Manuella Ghazi.  Last visit Feb 07 2019 for procedure which resolved her headache.       Nocturia    Now taking Myrbetriq prescribed by Urology.  Still having 1 to 3 voids per night . Advised to take it at dinner instead of breakfast and to limit evening liquids.         I provided  30 minutes of  face-to-face time during this encounter reviewing patient's current problems and past surgeries, labs and imaging studies, providing counseling on the above mentioned problems , and coordination  of care .  I am having Tykira Goth maintain her Multiple Vitamins-Minerals (PRESERVISION  AREDS 2 PO), Multiple Vitamins-Minerals (CENTRUM SILVER PO), Misc Natural Products (OSTEO BI-FLEX JOINT SHIELD PO), fluticasone, latanoprost, denosumab, mirabegron ER, amLODipine, estradiol, pravastatin, levothyroxine, and Cholecalciferol.  No orders of the defined types were placed in this encounter.   There are no discontinued medications.  Follow-up: No follow-ups on file.   Crecencio Mc, MD

## 2019-04-10 NOTE — Assessment & Plan Note (Addendum)
Seeing Neurology,  Dr Manuella Ghazi.  Last visit Feb 07 2019 for procedure which resolved her headache.

## 2019-04-10 NOTE — Assessment & Plan Note (Signed)
Causing occipital headaches, now resolved since Dr Manuella Ghazi gave her an injection

## 2019-04-10 NOTE — Assessment & Plan Note (Addendum)
With history of pelvic fractures. Continue Prolia,  Last prolia injection Nov 07 2018

## 2019-04-10 NOTE — Assessment & Plan Note (Signed)
Now taking Myrbetriq prescribed by Urology.  Still having 1 to 3 voids per night . Advised to take it at dinner instead of breakfast and to limit evening liquids.

## 2019-04-11 NOTE — Assessment & Plan Note (Signed)
Thyroid function is WNL on current dose.  No current changes needed.   Lab Results  Component Value Date   TSH 2.25 04/10/2019

## 2019-04-11 NOTE — Assessment & Plan Note (Signed)
She has had no appreciable improvement in symptoms with cymbalta  Denies pain.

## 2019-04-11 NOTE — Assessment & Plan Note (Signed)
Remains resolved. Marland Kitchen  Ultrasound of gallbladder was normal.   Lab Results  Component Value Date   ALT 15 04/10/2019   AST 19 04/10/2019   ALKPHOS 49 04/10/2019   BILITOT 0.6 04/10/2019

## 2019-04-23 DIAGNOSIS — M5136 Other intervertebral disc degeneration, lumbar region: Secondary | ICD-10-CM | POA: Diagnosis not present

## 2019-04-23 DIAGNOSIS — M48062 Spinal stenosis, lumbar region with neurogenic claudication: Secondary | ICD-10-CM | POA: Diagnosis not present

## 2019-04-23 DIAGNOSIS — M5416 Radiculopathy, lumbar region: Secondary | ICD-10-CM | POA: Diagnosis not present

## 2019-04-30 DIAGNOSIS — E559 Vitamin D deficiency, unspecified: Secondary | ICD-10-CM | POA: Diagnosis not present

## 2019-04-30 DIAGNOSIS — G609 Hereditary and idiopathic neuropathy, unspecified: Secondary | ICD-10-CM | POA: Diagnosis not present

## 2019-04-30 DIAGNOSIS — E538 Deficiency of other specified B group vitamins: Secondary | ICD-10-CM | POA: Diagnosis not present

## 2019-04-30 DIAGNOSIS — M5481 Occipital neuralgia: Secondary | ICD-10-CM | POA: Diagnosis not present

## 2019-05-08 ENCOUNTER — Ambulatory Visit (INDEPENDENT_AMBULATORY_CARE_PROVIDER_SITE_OTHER): Payer: PPO | Admitting: *Deleted

## 2019-05-08 ENCOUNTER — Other Ambulatory Visit: Payer: Self-pay

## 2019-05-08 DIAGNOSIS — M810 Age-related osteoporosis without current pathological fracture: Secondary | ICD-10-CM

## 2019-05-08 MED ORDER — DENOSUMAB 60 MG/ML ~~LOC~~ SOSY
60.0000 mg | PREFILLED_SYRINGE | Freq: Once | SUBCUTANEOUS | Status: AC
  Administered 2019-05-08: 60 mg via SUBCUTANEOUS

## 2019-05-08 NOTE — Progress Notes (Addendum)
Patient presented for Prolia injection to right arm [place], patient voiced no concerns or complaints during or after injection.  Sent to Dr. Nicki Reaper to review in absence of PCP in office this morning.  Reviewed.  Dr Nicki Reaper

## 2019-05-09 ENCOUNTER — Other Ambulatory Visit: Payer: Self-pay | Admitting: Internal Medicine

## 2019-06-29 ENCOUNTER — Other Ambulatory Visit: Payer: Self-pay | Admitting: Internal Medicine

## 2019-07-02 DIAGNOSIS — H353122 Nonexudative age-related macular degeneration, left eye, intermediate dry stage: Secondary | ICD-10-CM | POA: Diagnosis not present

## 2019-07-24 DIAGNOSIS — L538 Other specified erythematous conditions: Secondary | ICD-10-CM | POA: Diagnosis not present

## 2019-07-24 DIAGNOSIS — D225 Melanocytic nevi of trunk: Secondary | ICD-10-CM | POA: Diagnosis not present

## 2019-07-24 DIAGNOSIS — D692 Other nonthrombocytopenic purpura: Secondary | ICD-10-CM | POA: Diagnosis not present

## 2019-07-24 DIAGNOSIS — L820 Inflamed seborrheic keratosis: Secondary | ICD-10-CM | POA: Diagnosis not present

## 2019-09-28 ENCOUNTER — Other Ambulatory Visit: Payer: Self-pay | Admitting: Internal Medicine

## 2019-10-12 ENCOUNTER — Encounter: Payer: Self-pay | Admitting: Urology

## 2019-10-12 ENCOUNTER — Other Ambulatory Visit: Payer: Self-pay

## 2019-10-12 ENCOUNTER — Ambulatory Visit (INDEPENDENT_AMBULATORY_CARE_PROVIDER_SITE_OTHER): Payer: PPO | Admitting: Urology

## 2019-10-12 VITALS — BP 137/70 | HR 76 | Ht 59.0 in | Wt 108.0 lb

## 2019-10-12 DIAGNOSIS — R351 Nocturia: Secondary | ICD-10-CM | POA: Diagnosis not present

## 2019-10-12 DIAGNOSIS — R350 Frequency of micturition: Secondary | ICD-10-CM

## 2019-10-12 MED ORDER — OXYBUTYNIN CHLORIDE 5 MG PO TABS: 5.0000 mg | ORAL_TABLET | Freq: Every day | ORAL | 3 refills | Status: DC

## 2019-10-12 MED ORDER — MIRABEGRON ER 50 MG PO TB24: 50.0000 mg | ORAL_TABLET | Freq: Every day | ORAL | 11 refills | Status: DC

## 2019-10-12 NOTE — Progress Notes (Signed)
10/12/2019 10:43 AM   Gloria Carr July 15, 1927 [id number]  Referring provider: Crecencio Mc, MD [place],  [place] [address]  Chief Complaint  Patient presents with   Urinary Frequency    25mo follow up    HPI:  F/u -   1) H/odysuria and frequencygoing since June 2020. She drinks water and two cups of coffee. Also red wine at night. She has hesitancy.  Urine culture was <10k cfu.She tried antibiotics and Azo without relief. She then was referred to urology and started on Ditropan XL along with cranberry and probiotic tablets. She is on HRT/TV estrogen - now Estrace. She tried Myrbetriq. No prolapse symptoms. Some constipation - loose stools.No GU history or surgery. NSVD x 7.  Myrbetriq increased to 50 mg and she noticed a big improvement in the frequency, urgency and bladder pain.  2) She had MH on UA 07/28/2018 with TNTC wbc as well. She underwent a CT scan of the abdomen and pelvis 02/21/2018 which showed a normal urinary tract.Her bladder was moderately distended.Her creatinine was 0.59. Her PVRwas 77 ml. She also underwent renal/bladder US 09/14/2018 where a small mucosal nodule may have been seen in the bladder and a hypoechoic area adjacent to bladder possible ureterocele.Cystoscopy was negative and benign Sep 2020.   She returns in management of above. She continues Myrbetriq. UA clear. Voids with a good flow. She has frequency. She has urgency. No gross hematuria. Noc 2-3 is bothersome. She did have noc x 3-5 prior to Myrbetriq.    PMH: Past Medical History:  Diagnosis Date   Allergy    Arthritis    Glaucoma    History of chickenpox    History of recurrent UTIs    Hypertension    Macular degeneration    Thyroid disease     Surgical History: Past Surgical History:  Procedure Laterality Date   BILATERAL CARPAL TUNNEL RELEASE  1999-2000   CATARACT EXTRACTION, BILATERAL  2013    Home Medications:  Allergies as of  10/12/2019      Reactions   Sulfa Antibiotics Other (See Comments)   Childhood reaction       Medication List       Accurate as of October 12, 2019 10:43 AM. If you have any questions, ask your nurse or doctor.        amLODipine 2.5 MG tablet Commonly known as: NORVASC TAKE 1 TABLET BY MOUTH DAILY AT NIGHT FOR BLOOD PRESSURE   Cholecalciferol 25 MCG (1000 UT) capsule Take by mouth.   denosumab 60 MG/ML Sosy injection Commonly known as: PROLIA Inject 60 mg into the skin every 6 (six) months.   estradiol 0.1 MG/GM vaginal cream Commonly known as: ESTRACE Place 1 Applicatorful vaginally 3 (three) times a week. Apply one pea-sized amount around the urethra 3 times weekly.   Flonase 50 MCG/ACT nasal spray Generic drug: fluticasone Place into both nostrils daily. Reported on 01/17/2015   latanoprost 0.005 % ophthalmic solution Commonly known as: XALATAN INSTILL 1 DROP IN THE LEFT EYE AT NIGHT   levothyroxine 50 MCG tablet Commonly known as: SYNTHROID TAKE 1 TABLET EVERY DAY ON EMPTY STOMACHWITH A GLASS OF WATER AT LEAST 30-60 MINBEFORE BREAKFAST   mirabegron ER 50 MG Tb24 tablet Commonly known as: MYRBETRIQ Take 1 tablet (50 mg total) by mouth daily.   OSTEO BI-FLEX JOINT SHIELD PO Take by mouth.   pravastatin 20 MG tablet Commonly known as: PRAVACHOL TAKE ONE TABLET BY MOUTH EVERY DAY  PRESERVISION AREDS 2 PO Take by mouth.   CENTRUM SILVER PO Take by mouth.       Allergies:  Allergies  Allergen Reactions   Sulfa Antibiotics Other (See Comments)    Childhood reaction     Family History: Family History  Problem Relation Age of Onset   Stroke Mother    Cancer Mother 27       colon   Cancer Father        bone   Cancer Son        Jaw   Stroke Sister    Breast cancer Neg Hx     Social History:  reports that she has quit smoking. She has never used smokeless tobacco. She reports current alcohol use of about 5.0 standard drinks of alcohol per  week. She reports that she does not use drugs.   Physical Exam: LMP  (LMP Unknown)   Constitutional:  Alert and oriented, No acute distress. HEENT: [place] AT, moist mucus membranes.  Trachea midline, no masses. Cardiovascular: No clubbing, cyanosis, or edema. Respiratory: Normal respiratory effort, no increased work of breathing. GI: Abdomen is soft, nontender, nondistended, no abdominal masses GU: No CVA tenderness Lymph: No cervical or inguinal lymphadenopathy. Skin: No rashes, bruises or suspicious lesions. Neurologic: Grossly intact, no focal deficits, moving all 4 extremities. Psychiatric: Normal mood and affect.  Laboratory Data: Lab Results  Component Value Date   WBC 7.4 10/09/2018   HGB 13.6 10/09/2018   HCT 41.6 10/09/2018   MCV 92.1 10/09/2018   PLT 391.0 10/09/2018    Lab Results  Component Value Date   CREATININE 0.65 04/10/2019    No results found for: PSA  No results found for: TESTOSTERONE  Lab Results  Component Value Date   HGBA1C 5.7 04/10/2019    Urinalysis    Component Value Date/Time   APPEARANCEUR Clear 03/30/2019 1042   GLUCOSEU Negative 03/30/2019 1042   BILIRUBINUR Negative 03/30/2019 1042   PROTEINUR Negative 03/30/2019 1042   UROBILINOGEN 0.2 04/13/2017 1357   NITRITE Negative 03/30/2019 1042   LEUKOCYTESUR 1+ (A) 03/30/2019 1042    Lab Results  Component Value Date   LABMICR See below: 03/30/2019   WBCUA 11-30 (A) 03/30/2019   LABEPIT 0-10 03/30/2019   BACTERIA Few 03/30/2019    Pertinent Imaging: n/a No results found for this or any previous visit.  No results found for this or any previous visit.  No results found for this or any previous visit.  No results found for this or any previous visit.  Results for orders placed during the hospital encounter of 09/14/18  US RENAL  Narrative CLINICAL DATA:  Microscopic hematuria, history hypertension, recurrent UTIs, former smoker  EXAM: RENAL / URINARY TRACT ULTRASOUND  COMPLETE  COMPARISON:  CT abdomen and pelvis 02/21/2018  FINDINGS: Right Kidney:  Renal measurements: 9.9 x 3.8 x 4.5 cm = volume: 89 mL . Normal cortical thickness and echogenicity. No mass, hydronephrosis or shadowing calcification.  Left Kidney:  Renal measurements: 9.7 x 3.9 x 3.7 cm = volume: 66 mL. Normal cortical thickness and echogenicity. No mass, hydronephrosis or shadowing calcification.  Bladder:  Well distended. Questionable nodular focus along the superior wall 7 x 4 mm, polyp/tumor not excluded. Additionally, a small intraluminal septation is identified raising question of a RIGHT ureterocele. Additional small cystic collection is seen posterior to the LEFT bladder, 11 mm diameter, question LEFT ureterocele, small bladder diverticulum, unlikely distal LEFT ureteral dilatation or ovarian cyst.  Incidentally noted small amount  of nonspecific endometrial fluid up to 6 mm thick.  IMPRESSION: Normal sonographic appearance of the kidneys bilaterally.  Suspicion of a RIGHT ureterocele with question LEFT ureterocele versus small bladder diverticulum as discussed above.  Small nodular focus 7 x 4 mm identified within the bladder along the superior wall question polyp/tumor; correlation with cystoscopy recommended to exclude neoplasm.  Small amount of nonspecific endometrial fluid of uncertain etiology.   Electronically Signed By: Lavonia Dana M.D. On: 09/15/2018 08:34  No results found for this or any previous visit.  No results found for this or any previous visit.  No results found for this or any previous visit.   Assessment & Plan:    1. Frequency of micturition, nocturia - symptoms better on Myrbetriq but still bothersome. Discussed the nature r/b/a to adding a low dose oxybutynin and she elects to proceed. If symptoms improve we could consider cutting back the myrbetriq.   - Urinalysis, Complete   No follow-ups on file.  Festus Aloe,  MD  [facility] [address], [place], [place] [address] [phone]

## 2019-10-13 LAB — URINALYSIS, COMPLETE
Bilirubin, UA: NEGATIVE
Glucose, UA: NEGATIVE
Ketones, UA: NEGATIVE
Nitrite, UA: NEGATIVE
Protein,UA: NEGATIVE
RBC, UA: NEGATIVE
Specific Gravity, UA: <1.005 — ABNORMAL LOW (ref 1.005–1.030)
Urobilinogen, Ur: 0.2 mg/dL (ref 0.2–1.0)
pH, UA: 6 (ref 5.0–7.5)

## 2019-10-13 LAB — MICROSCOPIC EXAMINATION: Bacteria, UA: NONE SEEN

## 2019-10-22 ENCOUNTER — Telehealth: Payer: Self-pay | Admitting: Internal Medicine

## 2019-10-22 NOTE — Telephone Encounter (Signed)
Prolia approved can schedule on or after 11/07/19, please schedule when patient returns call.

## 2019-10-22 NOTE — Telephone Encounter (Signed)
Pt scheduled for 10/28

## 2019-10-31 DIAGNOSIS — M5481 Occipital neuralgia: Secondary | ICD-10-CM | POA: Diagnosis not present

## 2019-10-31 DIAGNOSIS — G609 Hereditary and idiopathic neuropathy, unspecified: Secondary | ICD-10-CM | POA: Diagnosis not present

## 2019-11-05 ENCOUNTER — Telehealth: Payer: Self-pay | Admitting: Urology

## 2019-11-05 NOTE — Telephone Encounter (Signed)
Patient called the office today.  She reports that she is taking the Myrbetriq and it seems to be helping a little bit.  She is taking the medication at dinner time, but is getting up 1-2 times per night.  Patient reports that she started taking the oxybutynin, but it gave her terrible dry mouth so she stopped taking it.    Do you recommend that she try something different?

## 2019-11-06 NOTE — Telephone Encounter (Signed)
We could add solifenacin 5 mg instead of the oxybutyinin. Sometimes that has less dry mouth. It also depends on her formulary. She should continue myrbetriq. OK to send in solifenacin 5 mg daily if she is interested.    Patient notified she states she does not wish to try the solifenacin at this time and will just continue Myrbetriq

## 2019-11-07 ENCOUNTER — Other Ambulatory Visit: Payer: Self-pay | Admitting: Family Medicine

## 2019-11-08 ENCOUNTER — Other Ambulatory Visit: Payer: Self-pay

## 2019-11-08 ENCOUNTER — Ambulatory Visit: Payer: PPO

## 2019-11-08 ENCOUNTER — Ambulatory Visit (INDEPENDENT_AMBULATORY_CARE_PROVIDER_SITE_OTHER): Payer: PPO

## 2019-11-08 DIAGNOSIS — M810 Age-related osteoporosis without current pathological fracture: Secondary | ICD-10-CM

## 2019-11-08 MED ORDER — DENOSUMAB 60 MG/ML ~~LOC~~ SOSY
60.0000 mg | PREFILLED_SYRINGE | Freq: Once | SUBCUTANEOUS | Status: AC
  Administered 2019-11-08: 60 mg via SUBCUTANEOUS

## 2019-11-08 NOTE — Progress Notes (Signed)
Patient presented for 6-month Prolia injection SQ to right arm. Patient tolerated well. 

## 2019-12-04 ENCOUNTER — Telehealth: Payer: Self-pay | Admitting: *Deleted

## 2019-12-04 NOTE — Telephone Encounter (Signed)
Pt calling stating that she's in the donut hole and can not afford the myrbetriq and would like to know if taking the oxybutynin is good enough? Per pt she only gets up once at night to urinate. Please advise

## 2019-12-05 NOTE — Addendum Note (Signed)
Addended by: Evelina Bucy on: 12/05/2019 02:04 PM   Modules accepted: Orders

## 2019-12-05 NOTE — Telephone Encounter (Signed)
Patient called triage line again for same thing. Please advise.

## 2019-12-05 NOTE — Telephone Encounter (Signed)
Per Dr Junious Silk Let her know Oxybutynin is not ideal, as it can cause constipation, confusion, and dry moouth, but we can start at a low dose. Send in oxybutynin 5 mg po QHS, #30, 11 refills and we can titrate up from there.    Message text    Patient already has RX for oxybutynin 5 mg, discontinued Myrbetriq. Patient verbalized understanding.

## 2019-12-19 ENCOUNTER — Other Ambulatory Visit: Payer: Self-pay | Admitting: Internal Medicine

## 2019-12-26 ENCOUNTER — Ambulatory Visit (INDEPENDENT_AMBULATORY_CARE_PROVIDER_SITE_OTHER): Payer: PPO

## 2019-12-26 VITALS — Ht 59.0 in | Wt 108.0 lb

## 2019-12-26 DIAGNOSIS — Z0000 Encounter for general adult medical examination without abnormal findings: Secondary | ICD-10-CM | POA: Diagnosis not present

## 2019-12-26 NOTE — Progress Notes (Addendum)
Subjective:   Gloria Carr is a 86 y.o. female who presents for Medicare Annual (Subsequent) preventive examination.  Review of Systems    No ROS.  Medicare Wellness Virtual Visit.   Cardiac Risk Factors include: advanced age (>51men, >74 women);hypertension     Objective:    Today's Vitals   12/26/19 1241  Weight: 108 lb (49 kg)  Height: 4\' 11"  (1.499 m)   Body mass index is 21.81 kg/m.  Advanced Directives 12/26/2019 12/25/2018 02/21/2018 12/21/2017 12/09/2016 12/10/2015 12/10/2014  Does Patient Have a Medical Advance Directive? Yes Yes No Yes Yes Yes Yes  Type of Paramedic of [place];Living will [place];Living will - [place];Living will [place];Living will Living will;Healthcare Power of [place];Living will  Does patient want to make changes to medical advance directive? No - Patient declined No - Patient declined - No - Patient declined No - Patient declined - -  Copy of [place] in Chart? Yes - validated most recent copy scanned in chart (See row information) Yes - validated most recent copy scanned in chart (See row information) - Yes - validated most recent copy scanned in chart (See row information) No - copy requested No - copy requested No - copy requested  Would patient like information on creating a medical advance directive? - - No - Patient declined - - - -    Current Medications (verified) Outpatient Encounter Medications as of 12/26/2019  Medication Sig   amLODipine (NORVASC) 2.5 MG tablet TAKE 1 TABLET BY MOUTH DAILY AT NIGHT FOR BLOOD PRESSURE   Cholecalciferol 25 MCG (1000 UT) capsule Take by mouth.   denosumab (PROLIA) 60 MG/ML SOSY injection Inject 60 mg into the skin every 6 (six) months.   estradiol (ESTRACE) 0.1 MG/GM vaginal cream Place 1 Applicatorful vaginally 3 (three) times a week. Apply one pea-sized amount around  the urethra 3 times weekly. (Patient not taking: Reported on 03/30/2019)   fluticasone (FLONASE) 50 MCG/ACT nasal spray Place into both nostrils daily. Reported on 01/17/2015   latanoprost (XALATAN) 0.005 % ophthalmic solution INSTILL 1 DROP IN THE LEFT EYE AT NIGHT   levothyroxine (SYNTHROID) 50 MCG tablet TAKE 1 TABLET EVERY DAY ON EMPTY STOMACHWITH A GLASS OF WATER AT LEAST 30-60 MINBEFORE BREAKFAST   Misc Natural Products (OSTEO BI-FLEX JOINT SHIELD PO) Take by mouth.   Multiple Vitamins-Minerals (CENTRUM SILVER PO) Take by mouth.   Multiple Vitamins-Minerals (PRESERVISION AREDS 2 PO) Take by mouth.   oxybutynin (DITROPAN) 5 MG tablet Take 1 tablet (5 mg total) by mouth at bedtime.   pravastatin (PRAVACHOL) 20 MG tablet TAKE ONE TABLET BY MOUTH EVERY DAY   No facility-administered encounter medications on file as of 12/26/2019.    Allergies (verified) Sulfa antibiotics   History: Past Medical History:  Diagnosis Date   Allergy    Arthritis    Glaucoma    History of chickenpox    History of recurrent UTIs    Hypertension    Macular degeneration    Thyroid disease    Past Surgical History:  Procedure Laterality Date   BILATERAL CARPAL TUNNEL RELEASE  1999-2000   CATARACT EXTRACTION, BILATERAL  2013   Family History  Problem Relation Age of Onset   Stroke Mother    Cancer Mother 15       colon   Cancer Father        bone   Cancer Son  Jaw   Stroke Sister    Breast cancer Neg Hx    Social History   Socioeconomic History   Marital status: Widowed    Spouse name: Not on file   Number of children: Not on file   Years of education: Not on file   Highest education level: Not on file  Occupational History   Not on file  Tobacco Use   Smoking status: Former Smoker   Smokeless tobacco: Never Used  Vaping Use   Vaping Use: Never used  Substance and Sexual Activity   Alcohol use: Yes    Alcohol/week: 5.0 standard drinks    Types: 5 Standard drinks or equivalent  per week    Comment: with dinner   Drug use: No   Sexual activity: Not Currently    Birth control/protection: Post-menopausal  Other Topics Concern   Not on file  Social History Narrative   Not on file   Social Determinants of Health   Financial Resource Strain: Low Risk    Difficulty of Paying Living Expenses: Not hard at all  Food Insecurity: No Food Insecurity   Worried About Charity fundraiser in the Last Year: Never true   [place] in the Last Year: Never true  Transportation Needs: No Transportation Needs   Lack of Transportation (Medical): No   Lack of Transportation (Non-Medical): No  Physical Activity: Not on file  Stress: No Stress Concern Present   Feeling of Stress : Not at all  Social Connections: Unknown   Frequency of Communication with Friends and Family: More than three times a week   Frequency of Social Gatherings with Friends and Family: Not on file   Attends Religious Services: Not on file   Active Member of Clubs or Organizations: Not on file   Attends Archivist Meetings: Not on file   Marital Status: Not on file    Tobacco Counseling Counseling given: Not Answered   Clinical Intake:  Pre-visit preparation completed: Yes        Diabetes: No  How often do you need to have someone help you when you read instructions, pamphlets, or other written materials from your doctor or pharmacy?: 1 - Never    Interpreter Needed?: No      Activities of Daily Living In your present state of health, do you have any difficulty performing the following activities: 12/26/2019  Hearing? N  Vision? N  Difficulty concentrating or making decisions? N  Walking or climbing stairs? Y  Comment Paces self.  Dressing or bathing? N  Doing errands, shopping? Y  Preparing Food and eating ? N  Using the Toilet? N  In the past six months, have you accidently leaked urine? Y  Comment Followed by Urology. Taking medication as directed. Managed  with daily pad.  Do you have problems with loss of bowel control? N  Managing your Medications? N  Managing your Finances? N  Housekeeping or managing your Housekeeping? N  Some recent data might be hidden    Patient Care Team: Crecencio Mc, MD as PCP - General (Internal Medicine)  Indicate any recent Medical Services you may have received from other than [facility] providers in the past year (date may be approximate).     Assessment:   This is a routine wellness examination for Coyle.  I connected with Teia today by telephone and verified that I am speaking with the correct person using two identifiers. Location patient: home Location provider: work Persons  participating in the virtual visit: patient, nurse.    I discussed the limitations, risks, security and privacy concerns of performing an evaluation and management service by telephone and the availability of in person appointments. The patient expressed understanding and verbally consented to this telephonic visit.    Interactive audio and video telecommunications were attempted between this provider and patient, however failed, due to patient having technical difficulties OR patient did not have access to video capability.  We continued and completed visit with audio only.  Some vital signs may be absent or patient reported.   Hearing/Vision screen  Hearing Screening   125Hz  250Hz  500Hz  1000Hz  2000Hz  3000Hz  4000Hz  6000Hz  8000Hz   Right ear:           Left ear:           Comments: Patient is able to hear conversational tones without difficulty.  No issues reported.  Vision Screening Comments: Wears corrective lenses  Cataract extraction, bilateral  Macular degeneration  Visual acuity not assessed, virtual visit. They have seen their ophthalmologist.   Dietary issues and exercise activities discussed:    Healthy diet Good water intake  Goals      Follow up with Provider as scheduled       Depression Screen PHQ  2/9 Scores 12/26/2019 12/25/2018 07/31/2018 12/21/2017 12/09/2016 12/10/2015 12/10/2014  PHQ - 2 Score 0 0 0 0 0 0 0  PHQ- 9 Score - - 0 - - - -    Fall Risk Fall Risk  12/26/2019 04/10/2019 12/25/2018 12/20/2018 10/09/2018  Falls in the past year? 0 0 0 0 1  Comment - - - - -  Number falls in past yr: 0 - - - 0  Injury with Fall? 0 - - - 1  Follow up Falls evaluation completed Falls evaluation completed Falls prevention discussed Falls evaluation completed Falls evaluation completed    [place]: Handrails in use when climbing stairs? Yes Home free of loose throw rugs in walkways, pet beds, electrical cords, etc? Yes  Adequate lighting in your home to reduce risk of falls? Yes   ASSISTIVE DEVICES UTILIZED TO PREVENT FALLS: Life alert? Yes  Use of a cane, walker or w/c? No. Plans to get a walker.  Grab bars in the bathroom? Yes  Shower chair or bench in shower? No  Elevated toilet seat or a handicapped toilet? No   TIMED UP AND GO: Was the test performed? No . Virtual visit.   Cognitive Function: Patient is alert and oriented x3.  Enjoys reading 3x daily.   MMSE - Mini Mental State Exam 12/09/2016 12/10/2014  Orientation to time 5 5  Orientation to Place 5 5  Registration 3 3  Attention/ Calculation 5 5  Recall 2 3  Language- name 2 objects 2 2  Language- repeat 1 1  Language- follow 3 step command 3 3  Language- read & follow direction 1 1  Write a sentence 1 1  Copy design 1 1  Total score 29 30     6CIT Screen 12/26/2019 12/25/2018 12/21/2017 12/10/2015  What Year? 0 points 0 points 0 points 0 points  What month? 0 points 0 points 0 points 0 points  What time? 0 points 0 points 0 points 0 points  Count back from 20 0 points 0 points 0 points 0 points  Months in reverse 2 points 0 points 0 points 0 points  Repeat phrase - 0 points 0 points 2 points  Total  Score - 0 0 2    Immunizations Immunization History  Administered  Date(s) Administered   Fluad Quad(high Dose 65+) 10/17/2019   Influenza, High Dose Seasonal PF 10/16/2015, 10/31/2017   Influenza-Unspecified 09/12/2014, 09/27/2016, 11/08/2018   PFIZER SARS-COV-2 Vaccination 01/17/2019, 02/14/2019, 10/18/2019   Pneumococcal Conjugate-13 01/17/2015   Pneumococcal Polysaccharide-23 01/17/2011   Td 12/11/2015   Tdap 12/11/2015, 12/11/2015   Zoster 07/15/2012   Health Maintenance Health Maintenance  Topic Date Due   TETANUS/TDAP  12/10/2025   INFLUENZA VACCINE  Completed   DEXA SCAN  Completed   COVID-19 Vaccine  Completed   PNA vac Low Risk Adult  Completed   Colorectal cancer screening: No longer required.   Mammogram status: No longer required due to aged out..  Bone Density- 12/22/15. Osteoporosis. Prolia injection every 6 months. Cholecalciferol 25 MCG (1000 UT) capsule.   Lung Cancer Screening: (Low Dose CT Chest recommended if Age 97-80 years, 30 pack-year currently smoking OR have quit w/in 15years.) does not qualify.   Hepatitis C Screening: does not qualify.  Vision Screening: Recommended annual ophthalmology exams for early detection of glaucoma and other disorders of the eye. Is the patient up to date with their annual eye exam?  Yes  Who is the provider or what is the name of the office in which the patient attends annual eye exams? [facility], Dr. Wallace Going. Visits every 6 months. Next appointment scheduled 01/01/20.   Dental Screening: Recommended annual dental exams for proper oral hygiene. Visits every 6 months.   Community Resource Referral / Chronic Care Management: CRR required this visit?  No   CCM required this visit?  No      Plan:   Keep all routine maintenance appointments.   I have personally reviewed and noted the following in the patient's chart:   Medical and social history Use of alcohol, tobacco or illicit drugs  Current medications and supplements Functional ability and status Nutritional  status Physical activity Advanced directives List of other physicians Hospitalizations, surgeries, and ER visits in previous 12 months Vitals Screenings to include cognitive, depression, and falls Referrals and appointments  In addition, I have reviewed and discussed with patient certain preventive protocols, quality metrics, and best practice recommendations. A written personalized care plan for preventive services as well as general preventive health recommendations were provided to patient via mychart.     OBrien-Blaney, Lexy Meininger L, LPN   [id number]    I have reviewed the above information and agree with above.   Deborra Medina, MD

## 2019-12-26 NOTE — Patient Instructions (Addendum)
Gloria Carr , Thank you for taking time to come for your Medicare Wellness Visit. I appreciate your ongoing commitment to your health goals. Please review the following plan we discussed and let me know if I can assist you in the future.   These are the goals we discussed: Goals    . Follow up with Provider as scheduled       This is a list of the screening recommended for you and due dates:  Health Maintenance  Topic Date Due  . Tetanus Vaccine  12/10/2025  . Flu Shot  Completed  . DEXA scan (bone density measurement)  Completed  . COVID-19 Vaccine  Completed  . Pneumonia vaccines  Completed    Immunizations Immunization History  Administered Date(s) Administered  . Fluad Quad(high Dose 84+) 10/17/2019  . Influenza, High Dose Seasonal PF 10/16/2015, 10/31/2017  . Influenza-Unspecified 09/12/2014, 09/27/2016, 11/08/2018  . PFIZER SARS-COV-2 Vaccination 01/17/2019, 02/14/2019, 10/18/2019  . Pneumococcal Conjugate-13 01/17/2015  . Pneumococcal Polysaccharide-23 01/17/2011  . Td 12/11/2015  . Tdap 12/11/2015, 12/11/2015  . Zoster 07/15/2012   Keep all routine maintenance appointments.   Advanced directives: Completed, on file.   Conditions/risks identified: None new.   Follow up in one year for your annual wellness visit.   Preventive Care 32 Years and Older, Female Preventive care refers to lifestyle choices and visits with your health care provider that can promote health and wellness. What does preventive care include?  A yearly physical exam. This is also called an annual well check.  Dental exams once or twice a year.  Routine eye exams. Ask your health care provider how often you should have your eyes checked.  Personal lifestyle choices, including:  Daily care of your teeth and gums.  Regular physical activity.  Eating a healthy diet.  Avoiding tobacco and drug use.  Limiting alcohol use.  Practicing safe sex.  Taking low-dose aspirin every  day.  Taking vitamin and mineral supplements as recommended by your health care provider. What happens during an annual well check? The services and screenings done by your health care provider during your annual well check will depend on your age, overall health, lifestyle risk factors, and family history of disease. Counseling  Your health care provider may ask you questions about your:  Alcohol use.  Tobacco use.  Drug use.  Emotional well-being.  Home and relationship well-being.  Sexual activity.  Eating habits.  History of falls.  Memory and ability to understand (cognition).  Work and work Statistician.  Reproductive health. Screening  You may have the following tests or measurements:  Height, weight, and BMI.  Blood pressure.  Lipid and cholesterol levels. These may be checked every 5 years, or more frequently if you are over 21 years old.  Skin check.  Lung cancer screening. You may have this screening every year starting at age 12 if you have a 30-pack-year history of smoking and currently smoke or have quit within the past 15 years.  Fecal occult blood test (FOBT) of the stool. You may have this test every year starting at age 35.  Flexible sigmoidoscopy or colonoscopy. You may have a sigmoidoscopy every 5 years or a colonoscopy every 10 years starting at age 55.  Hepatitis C blood test.  Hepatitis B blood test.  Sexually transmitted disease (STD) testing.  Diabetes screening. This is done by checking your blood sugar (glucose) after you have not eaten for a while (fasting). You may have this done every 1-3 years.  Bone density scan. This is done to screen for osteoporosis. You may have this done starting at age 79.  Mammogram. This may be done every 1-2 years. Talk to your health care provider about how often you should have regular mammograms. Talk with your health care provider about your test results, treatment options, and if necessary, the need  for more tests. Vaccines  Your health care provider may recommend certain vaccines, such as:  Influenza vaccine. This is recommended every year.  Tetanus, diphtheria, and acellular pertussis (Tdap, Td) vaccine. You may need a Td booster every 10 years.  Zoster vaccine. You may need this after age 5.  Pneumococcal 13-valent conjugate (PCV13) vaccine. One dose is recommended after age 23.  Pneumococcal polysaccharide (PPSV23) vaccine. One dose is recommended after age 14. Talk to your health care provider about which screenings and vaccines you need and how often you need them. This information is not intended to replace advice given to you by your health care provider. Make sure you discuss any questions you have with your health care provider. Document Released: 01/24/2015 Document Revised: 09/17/2015 Document Reviewed: 10/29/2014 Elsevier Interactive Patient Education  2017 [place] Prevention in the Home Falls can cause injuries. They can happen to people of all ages. There are many things you can do to make your home safe and to help prevent falls. What can I do on the outside of my home?  Regularly fix the edges of walkways and driveways and fix any cracks.  Remove anything that might make you trip as you walk through a door, such as a raised step or threshold.  Trim any bushes or trees on the path to your home.  Use bright outdoor lighting.  Clear any walking paths of anything that might make someone trip, such as rocks or tools.  Regularly check to see if handrails are loose or broken. Make sure that both sides of any steps have handrails.  Any raised decks and porches should have guardrails on the edges.  Have any leaves, snow, or ice cleared regularly.  Use sand or salt on walking paths during winter.  Clean up any spills in your garage right away. This includes oil or grease spills. What can I do in the bathroom?  Use night lights.  Install grab bars  by the toilet and in the tub and shower. Do not use towel bars as grab bars.  Use non-skid mats or decals in the tub or shower.  If you need to sit down in the shower, use a plastic, non-slip stool.  Keep the floor dry. Clean up any water that spills on the floor as soon as it happens.  Remove soap buildup in the tub or shower regularly.  Attach bath mats securely with double-sided non-slip rug tape.  Do not have throw rugs and other things on the floor that can make you trip. What can I do in the bedroom?  Use night lights.  Make sure that you have a light by your bed that is easy to reach.  Do not use any sheets or blankets that are too big for your bed. They should not hang down onto the floor.  Have a firm chair that has side arms. You can use this for support while you get dressed.  Do not have throw rugs and other things on the floor that can make you trip. What can I do in the kitchen?  Clean up any spills right away.  Avoid walking  on wet floors.  Keep items that you use a lot in easy-to-reach places.  If you need to reach something above you, use a strong step stool that has a grab bar.  Keep electrical cords out of the way.  Do not use floor polish or wax that makes floors slippery. If you must use wax, use non-skid floor wax.  Do not have throw rugs and other things on the floor that can make you trip. What can I do with my stairs?  Do not leave any items on the stairs.  Make sure that there are handrails on both sides of the stairs and use them. Fix handrails that are broken or loose. Make sure that handrails are as long as the stairways.  Check any carpeting to make sure that it is firmly attached to the stairs. Fix any carpet that is loose or worn.  Avoid having throw rugs at the top or bottom of the stairs. If you do have throw rugs, attach them to the floor with carpet tape.  Make sure that you have a light switch at the top of the stairs and the  bottom of the stairs. If you do not have them, ask someone to add them for you. What else can I do to help prevent falls?  Wear shoes that:  Do not have high heels.  Have rubber bottoms.  Are comfortable and fit you well.  Are closed at the toe. Do not wear sandals.  If you use a stepladder:  Make sure that it is fully opened. Do not climb a closed stepladder.  Make sure that both sides of the stepladder are locked into place.  Ask someone to hold it for you, if possible.  Clearly mark and make sure that you can see:  Any grab bars or handrails.  First and last steps.  Where the edge of each step is.  Use tools that help you move around (mobility aids) if they are needed. These include:  Canes.  Walkers.  Scooters.  Crutches.  Turn on the lights when you go into a dark area. Replace any light bulbs as soon as they burn out.  Set up your furniture so you have a clear path. Avoid moving your furniture around.  If any of your floors are uneven, fix them.  If there are any pets around you, be aware of where they are.  Review your medicines with your doctor. Some medicines can make you feel dizzy. This can increase your chance of falling. Ask your doctor what other things that you can do to help prevent falls. This information is not intended to replace advice given to you by your health care provider. Make sure you discuss any questions you have with your health care provider. Document Released: 10/24/2008 Document Revised: 06/05/2015 Document Reviewed: 02/01/2014 Elsevier Interactive Patient Education  2017 [employer].

## 2019-12-30 DIAGNOSIS — S2231XA Fracture of one rib, right side, initial encounter for closed fracture: Secondary | ICD-10-CM | POA: Diagnosis not present

## 2020-01-01 DIAGNOSIS — H401121 Primary open-angle glaucoma, left eye, mild stage: Secondary | ICD-10-CM | POA: Diagnosis not present

## 2020-01-01 DIAGNOSIS — H353122 Nonexudative age-related macular degeneration, left eye, intermediate dry stage: Secondary | ICD-10-CM | POA: Diagnosis not present

## 2020-01-18 ENCOUNTER — Ambulatory Visit: Payer: PPO | Admitting: Physician Assistant

## 2020-01-18 ENCOUNTER — Other Ambulatory Visit: Payer: Self-pay

## 2020-01-18 ENCOUNTER — Ambulatory Visit: Payer: PPO | Admitting: Urology

## 2020-01-18 VITALS — BP 165/76 | HR 69 | Ht 59.0 in | Wt 108.0 lb

## 2020-01-18 DIAGNOSIS — R350 Frequency of micturition: Secondary | ICD-10-CM

## 2020-01-18 LAB — BLADDER SCAN AMB NON-IMAGING: Scan Result: 313

## 2020-01-18 MED ORDER — MIRABEGRON ER 50 MG PO TB24: 50.0000 mg | ORAL_TABLET | Freq: Every day | ORAL | 0 refills | Status: DC

## 2020-01-18 MED ORDER — MIRABEGRON ER 50 MG PO TB24: 50.0000 mg | ORAL_TABLET | Freq: Every day | ORAL | 11 refills | Status: DC

## 2020-01-18 NOTE — Progress Notes (Signed)
01/18/2020 11:17 AM   Gloria Carr 1927-11-16 [id number]  CC: Chief Complaint  Patient presents with  . Follow-up   HPI: Gloria Carr is a 87 y.o. female with PMH OAB and microscopic hematuria with negative work-up in 2020 who presents today for symptom recheck and PVR.  Patient previously reported significant improvement in her urinary frequency and nocturia on Myrbetriq 50 mg daily, however she stopped this secondary to cost and was switched to oxybutynin 5 mg daily.  She started oxybutynin within the past week and has been taking it at bedtime.  Patient reports nocturia x1, x3-5 at baseline.  She is still experiencing some daytime frequency and is bothered by dry mouth.  PVR 313 mL today.  Patient reports Myrbetriq is expensive, costing her $45 per month with her insurance.  PMH: Past Medical History:  Diagnosis Date  . Allergy   . Arthritis   . Glaucoma   . History of chickenpox   . History of recurrent UTIs   . Hypertension   . Macular degeneration   . Thyroid disease     Surgical History: Past Surgical History:  Procedure Laterality Date  . BILATERAL CARPAL TUNNEL RELEASE  1999-2000  . CATARACT EXTRACTION, BILATERAL  2013    Home Medications:  Allergies as of 01/18/2020      Reactions   Sulfa Antibiotics Other (See Comments)   Childhood reaction       Medication List       Accurate as of January 18, 2020 11:17 AM. If you have any questions, ask your nurse or doctor.        amLODipine 2.5 MG tablet Commonly known as: NORVASC TAKE 1 TABLET BY MOUTH DAILY AT NIGHT FOR BLOOD PRESSURE   Cholecalciferol 25 MCG (1000 UT) capsule Take by mouth.   denosumab 60 MG/ML Sosy injection Commonly known as: PROLIA Inject 60 mg into the skin every 6 (six) months.   estradiol 0.1 MG/GM vaginal cream Commonly known as: ESTRACE Place 1 Applicatorful vaginally 3 (three) times a week. Apply one pea-sized amount around the urethra 3 times weekly.   Flonase 50 MCG/ACT  nasal spray Generic drug: fluticasone Place into both nostrils daily. Reported on 01/17/2015   latanoprost 0.005 % ophthalmic solution Commonly known as: XALATAN INSTILL 1 DROP IN THE LEFT EYE AT NIGHT   levothyroxine 50 MCG tablet Commonly known as: SYNTHROID TAKE 1 TABLET EVERY DAY ON EMPTY STOMACHWITH A GLASS OF WATER AT LEAST 30-60 MINBEFORE BREAKFAST   OSTEO BI-FLEX JOINT SHIELD PO Take by mouth.   oxybutynin 5 MG tablet Commonly known as: DITROPAN Take 1 tablet (5 mg total) by mouth at bedtime.   pravastatin 20 MG tablet Commonly known as: PRAVACHOL TAKE ONE TABLET BY MOUTH EVERY DAY   PRESERVISION AREDS 2 PO Take by mouth.   CENTRUM SILVER PO Take by mouth.       Allergies:  Allergies  Allergen Reactions  . Sulfa Antibiotics Other (See Comments)    Childhood reaction     Family History: Family History  Problem Relation Age of Onset  . Stroke Mother   . Cancer Mother 59       colon  . Cancer Father        bone  . Cancer Son        Jaw  . Stroke Sister   . Breast cancer Neg Hx     Social History:   reports that she has quit smoking. She has never used smokeless tobacco. She  reports current alcohol use of about 5.0 standard drinks of alcohol per week. She reports that she does not use drugs.  Physical Exam: BP (!) 165/76   Pulse 69   Ht 4\' 11"  (1.499 m)   Wt 108 lb (49 kg)   LMP  (LMP Unknown)   BMI 21.81 kg/m   Constitutional:  Alert and oriented, no acute distress, nontoxic appearing HEENT: [place], AT Cardiovascular: No clubbing, cyanosis, or edema Respiratory: Normal respiratory effort, no increased work of breathing Skin: No rashes, bruises or suspicious lesions Neurologic: Grossly intact, no focal deficits, moving all 4 extremities Psychiatric: Normal mood and affect  Laboratory Data: Results for orders placed or performed in visit on 01/18/20  Bladder Scan (Post Void Residual) in office  Result Value Ref Range   Scan Result 313 ml     Assessment & Plan:   1. Frequency of micturition Incomplete bladder emptying and dry mouth on oxybutynin.  I counseled her to stop this medication immediately secondary to side effects.  I explained that $45 per month for Myrbetriq is the cheapest I have seen this medication with insurance coverage.  I encouraged her to restart this medication as an alternative to oxybutynin and offered her samples if it is cost prohibitive for her.  She requested a renewed prescription for Myrbetriq today.  I provided her with 1 month of samples, a 1 year prescription and will plan for symptom recheck and PVR in 4 weeks. - Bladder Scan (Post Void Residual) in office - mirabegron ER (MYRBETRIQ) 50 MG TB24 tablet; Take 1 tablet (50 mg total) by mouth daily.  Dispense: 30 tablet; Refill: 11 - mirabegron ER (MYRBETRIQ) 50 MG TB24 tablet; Take 1 tablet (50 mg total) by mouth daily.  Dispense: 28 tablet; Refill: 0   Return in about 4 weeks (around 02/15/2020) for Symptom recheck with PVR.  Debroah Loop, PA-C  [facility] [address], [place], [place] [address] [phone]

## 2020-02-12 IMAGING — DX DG CERVICAL SPINE COMPLETE 4+V
5 series · 5 of 5 positions shown · non-contrast
Comparison: None.

CLINICAL DATA: Head and neck pain for several months. Injury Rivero Hano
Tiger.

EXAM:
CERVICAL SPINE - COMPLETE 4+ VIEW

[cervical spine ap]
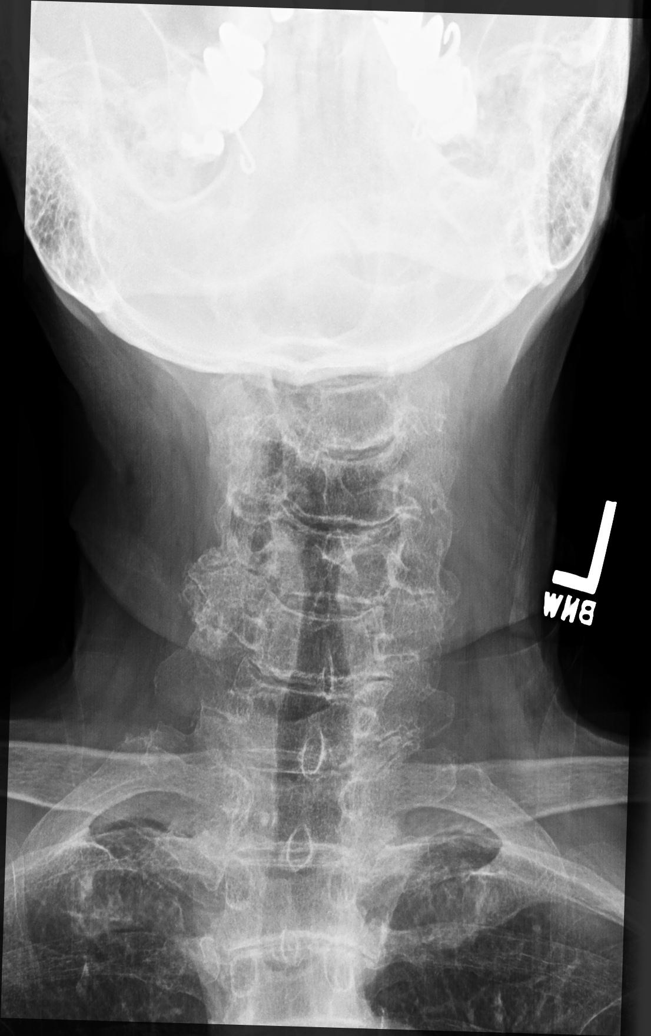

[cervical spine oblique (1 of 2)]
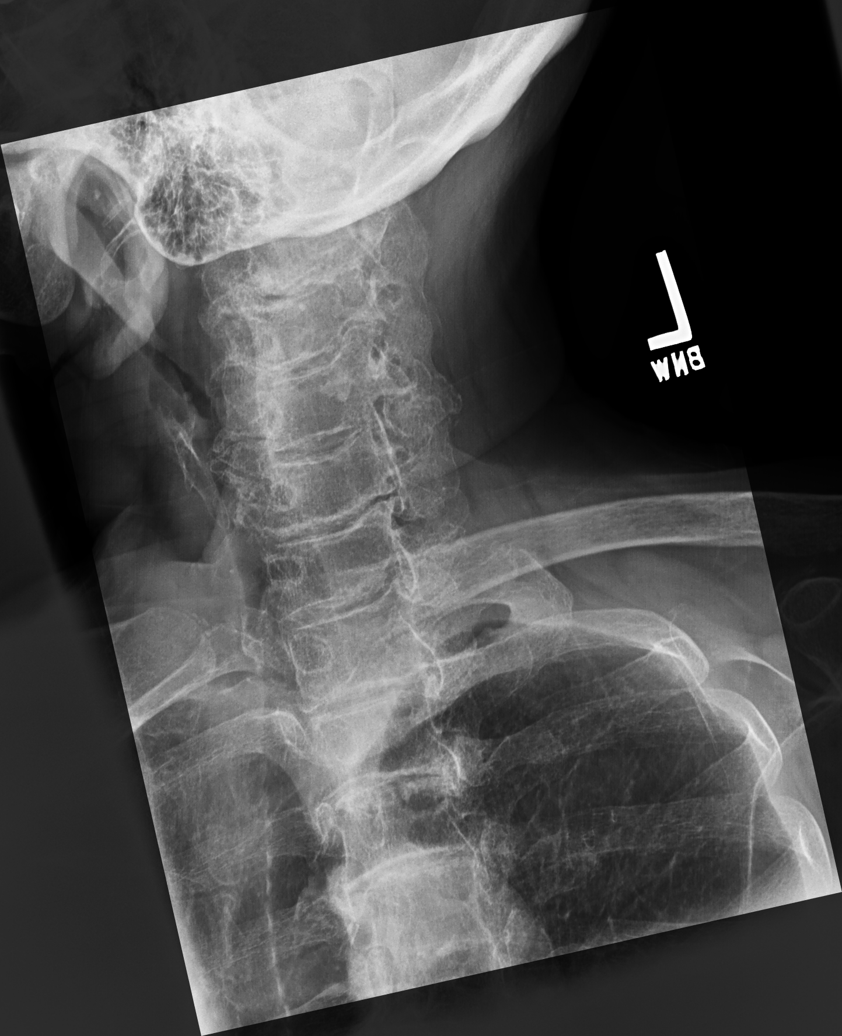

[cervical spine oblique (2 of 2)]
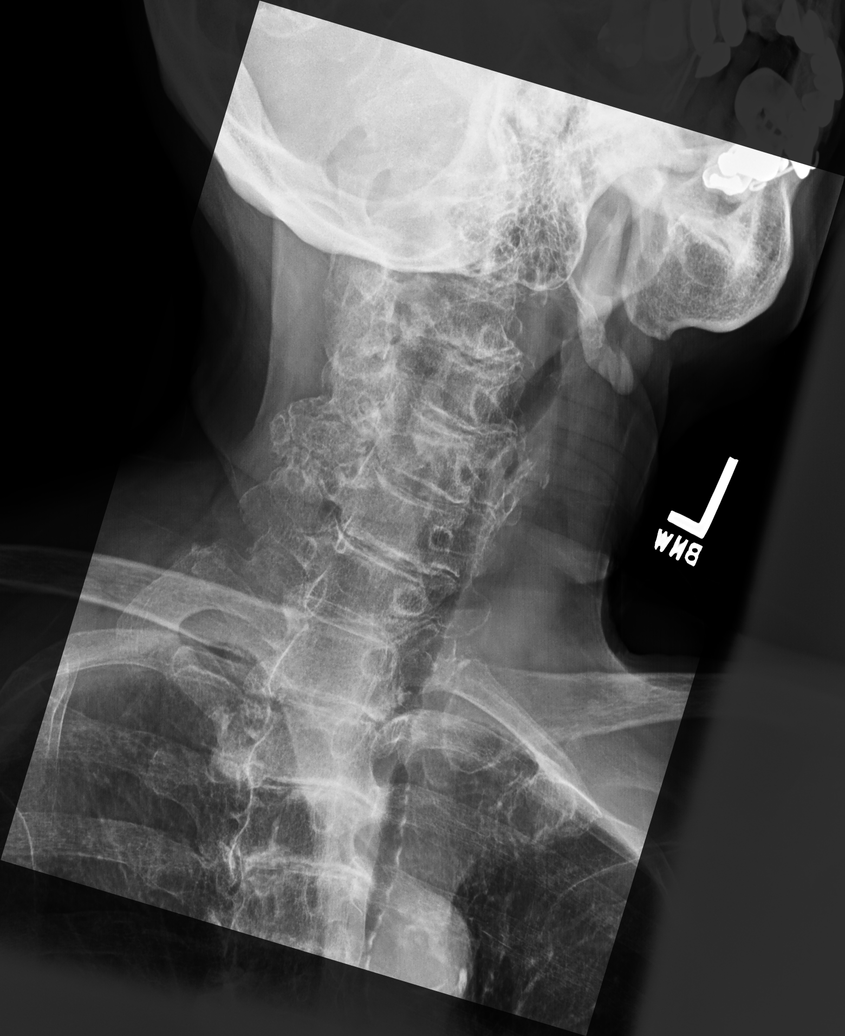

[cervical spine lat]
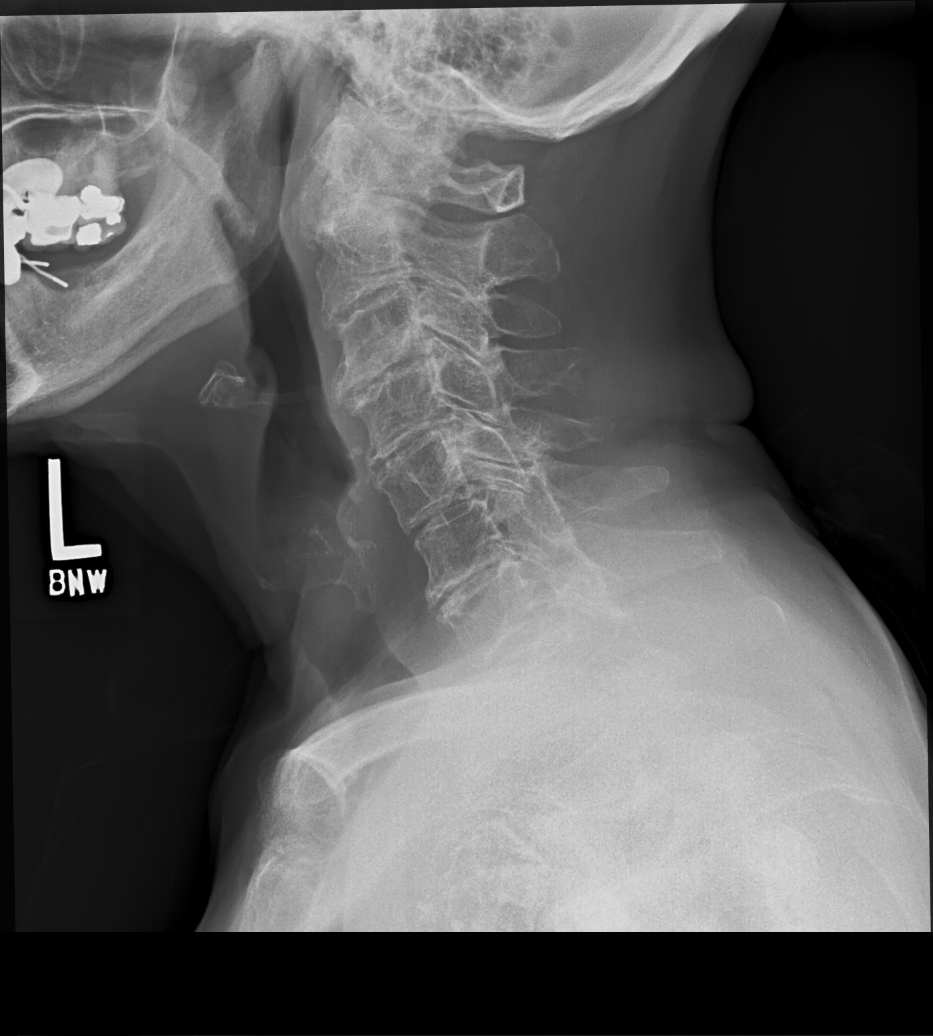

[cervical spine open mouth ap]
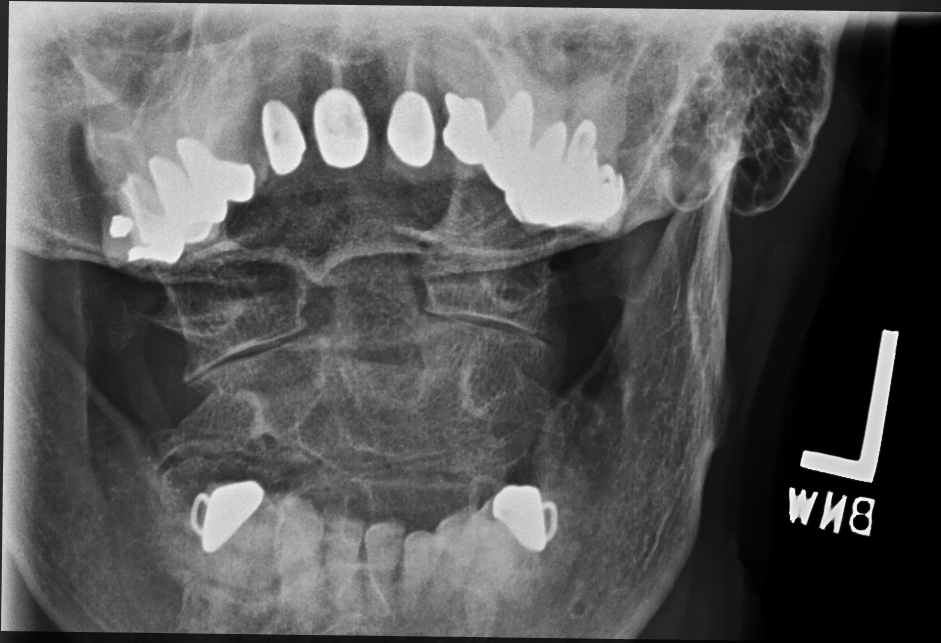

[5 of 5 positions shown; findings below may reference images not displayed]

FINDINGS: Degenerative changes throughout the cervical spine, at least
moderate in degree with associated disc space narrowings and osseous
spurring throughout. Most prominent degenerative change amongst the
posterior elements of the mid and lower cervical spine. There is
mild levoscoliosis which is likely related to patient positioning
and/or the underlying degenerative changes.

No evidence of acute vertebral body subluxation. No acute or
suspicious osseous lesion. No fracture line or displaced fracture
fragment seen. Prevertebral soft tissues are normal in thickness.
Visualized paravertebral soft tissues are unremarkable.
IMPRESSION: 1. Degenerative changes throughout the cervical spine, at least
moderate in degree, as detailed above.
2. Mild scoliosis.
3. No acute findings.

## 2020-02-15 ENCOUNTER — Other Ambulatory Visit: Payer: Self-pay

## 2020-02-15 ENCOUNTER — Ambulatory Visit: Payer: PPO | Admitting: Physician Assistant

## 2020-02-15 ENCOUNTER — Encounter: Payer: Self-pay | Admitting: Physician Assistant

## 2020-02-15 VITALS — BP 138/71 | HR 82 | Ht 60.0 in | Wt 104.0 lb

## 2020-02-15 DIAGNOSIS — N3281 Overactive bladder: Secondary | ICD-10-CM

## 2020-02-15 DIAGNOSIS — R350 Frequency of micturition: Secondary | ICD-10-CM | POA: Diagnosis not present

## 2020-02-15 LAB — BLADDER SCAN AMB NON-IMAGING

## 2020-02-15 NOTE — Progress Notes (Signed)
02/15/2020 2:15 PM   [name] 12/13/1927 [id number]  CC: Chief Complaint  Patient presents with  . Other  . Urinary Frequency    HPI: Gloria Carr is a 87 y.o. female with PMH OAB and microscopic hematuria with negative work-up in 2020 who presents today for symptom recheck and PVR on Myrbetriq 50 mg daily.  She previously switched to oxybutynin due to cost concerns and developed dry mouth and incomplete bladder emptying.  She was subsequently switched back to Myrbetriq.  Today she reports some urinary frequency but is unable to specify how often she is voiding during the daytime.  She has nocturia x2-3, baseline x3-5.  She does not believe the Myrbetriq is helping as much as it previously did.  Notably, she was previously reported to have nocturia x2-3 on Myrbetriq.  She is using 2 pads daily and they are damp.  PVR 48mL.  She reports a history of chronic constipation.  She has used Metamucil in the past with some improvement in her bowels.  She reports difficult to pass stool first thing in the morning, sometimes with progressively soft stool throughout the morning and bowel frequency.  She wonders if this could represent a malignancy and if she should have a colonoscopy.  She states her PCP previously discouraged this due to her age.  PMH: Past Medical History:  Diagnosis Date  . Allergy   . Arthritis   . Glaucoma   . History of chickenpox   . History of recurrent UTIs   . Hypertension   . Macular degeneration   . Thyroid disease     Surgical History: Past Surgical History:  Procedure Laterality Date  . BILATERAL CARPAL TUNNEL RELEASE  1999-2000  . CATARACT EXTRACTION, BILATERAL  2013    Home Medications:  Allergies as of 02/15/2020      Reactions   Sulfa Antibiotics Other (See Comments)   Childhood reaction       Medication List       Accurate as of February 15, 2020  2:15 PM. If you have any questions, ask your nurse or doctor.        amLODipine 2.5 MG  tablet Commonly known as: NORVASC TAKE 1 TABLET BY MOUTH DAILY AT NIGHT FOR BLOOD PRESSURE   denosumab 60 MG/ML Sosy injection Commonly known as: PROLIA Inject 60 mg into the skin every 6 (six) months.   fluticasone 50 MCG/ACT nasal spray Commonly known as: FLONASE Place into both nostrils daily. Reported on 01/17/2015   latanoprost 0.005 % ophthalmic solution Commonly known as: XALATAN INSTILL 1 DROP IN THE LEFT EYE AT NIGHT   levothyroxine 50 MCG tablet Commonly known as: SYNTHROID TAKE 1 TABLET EVERY DAY ON EMPTY STOMACHWITH A GLASS OF WATER AT LEAST 30-60 MINBEFORE BREAKFAST   mirabegron ER 50 MG Tb24 tablet Commonly known as: MYRBETRIQ Take 1 tablet (50 mg total) by mouth daily.   mirabegron ER 50 MG Tb24 tablet Commonly known as: MYRBETRIQ Take 1 tablet (50 mg total) by mouth daily.   OSTEO BI-FLEX JOINT SHIELD PO Take by mouth.   pravastatin 20 MG tablet Commonly known as: PRAVACHOL TAKE ONE TABLET BY MOUTH EVERY DAY   PRESERVISION AREDS 2 PO Take by mouth.   CENTRUM SILVER PO Take by mouth.       Allergies:  Allergies  Allergen Reactions  . Sulfa Antibiotics Other (See Comments)    Childhood reaction     Family History: Family History  Problem Relation Age of Onset  .  Stroke Mother   . Cancer Mother 27       colon  . Cancer Father        bone  . Cancer Son        Jaw  . Stroke Sister   . Breast cancer Neg Hx     Social History:   reports that she has quit smoking. She has never used smokeless tobacco. She reports current alcohol use of about 5.0 standard drinks of alcohol per week. She reports that she does not use drugs.  Physical Exam: BP 138/71   Pulse 82   Ht 5' (1.524 m)   Wt 104 lb (47.2 kg)   LMP  (LMP Unknown)   BMI 20.31 kg/m   Constitutional:  Alert and oriented, no acute distress, nontoxic appearing HEENT: [place], AT Cardiovascular: No clubbing, cyanosis, or edema Respiratory: Normal respiratory effort, no increased work of  breathing Skin: No rashes, bruises or suspicious lesions Neurologic: Grossly intact, no focal deficits, moving all 4 extremities Psychiatric: Normal mood and affect  Laboratory Data: Results for orders placed or performed in visit on 02/15/20  Bladder Scan (Post Void Residual) in office  Result Value Ref Range   Scan Result 85mL    Assessment & Plan:   1. OAB (overactive bladder) 87 year old female with OAB wet presents for symptom recheck on Myrbetriq 50 mg daily.  She is having less symptomatic improvement than on anticholinergics, however she is emptying with much greater success.  I explained that she may continue to see symptomatic improvement over the next 2 months if she continues to take Myrbetriq.  We discussed alternative OAB therapies today to be used with or without Myrbetriq.  I offered her intravesical Botox versus InterStim versus PTNS.  Patient is not interested in pursuing Botox or InterStim and wishes to defer PTNS.  Ultimately, she wishes to continue Myrbetriq.  I am in agreement with this plan.  I counseled her to resume her bowel regimen of Metamucil and increase to MiraLAX if this does not help for produce formed stools that are easy to pass.  I explained that I would defer to her PCP regarding management of her bowels, as this is outside the scope of urology.  I do believe she may have an element of bowel bladder dysfunction that is contributing to her urinary symptoms. - Bladder Scan (Post Void Residual) in office   Return in about 1 year (around 02/14/2021) for Symptom recheck with PVR.  Debroah Loop, PA-C  [facility] [address] [place], [place] [address] [phone]

## 2020-03-04 DIAGNOSIS — G609 Hereditary and idiopathic neuropathy, unspecified: Secondary | ICD-10-CM | POA: Diagnosis not present

## 2020-03-13 DIAGNOSIS — R2681 Unsteadiness on feet: Secondary | ICD-10-CM | POA: Diagnosis not present

## 2020-03-13 DIAGNOSIS — M6281 Muscle weakness (generalized): Secondary | ICD-10-CM | POA: Diagnosis not present

## 2020-03-13 DIAGNOSIS — R2689 Other abnormalities of gait and mobility: Secondary | ICD-10-CM | POA: Diagnosis not present

## 2020-03-13 DIAGNOSIS — G609 Hereditary and idiopathic neuropathy, unspecified: Secondary | ICD-10-CM | POA: Diagnosis not present

## 2020-03-18 DIAGNOSIS — R2681 Unsteadiness on feet: Secondary | ICD-10-CM | POA: Diagnosis not present

## 2020-03-18 DIAGNOSIS — G609 Hereditary and idiopathic neuropathy, unspecified: Secondary | ICD-10-CM | POA: Diagnosis not present

## 2020-03-18 DIAGNOSIS — M6281 Muscle weakness (generalized): Secondary | ICD-10-CM | POA: Diagnosis not present

## 2020-03-18 DIAGNOSIS — R2689 Other abnormalities of gait and mobility: Secondary | ICD-10-CM | POA: Diagnosis not present

## 2020-03-20 ENCOUNTER — Other Ambulatory Visit: Payer: Self-pay | Admitting: Internal Medicine

## 2020-03-26 ENCOUNTER — Other Ambulatory Visit: Payer: Self-pay | Admitting: Internal Medicine

## 2020-05-07 ENCOUNTER — Other Ambulatory Visit: Payer: Self-pay | Admitting: Family Medicine

## 2020-05-30 DIAGNOSIS — S92254A Nondisplaced fracture of navicular [scaphoid] of right foot, initial encounter for closed fracture: Secondary | ICD-10-CM | POA: Diagnosis not present

## 2020-06-11 DIAGNOSIS — S93621A Sprain of tarsometatarsal ligament of right foot, initial encounter: Secondary | ICD-10-CM | POA: Diagnosis not present

## 2020-06-20 ENCOUNTER — Other Ambulatory Visit: Payer: Self-pay | Admitting: Internal Medicine

## 2020-07-04 DIAGNOSIS — H401121 Primary open-angle glaucoma, left eye, mild stage: Secondary | ICD-10-CM | POA: Diagnosis not present

## 2020-07-16 ENCOUNTER — Ambulatory Visit (INDEPENDENT_AMBULATORY_CARE_PROVIDER_SITE_OTHER): Payer: PPO | Admitting: Internal Medicine

## 2020-07-16 ENCOUNTER — Encounter: Payer: Self-pay | Admitting: Internal Medicine

## 2020-07-16 ENCOUNTER — Other Ambulatory Visit: Payer: Self-pay

## 2020-07-16 VITALS — BP 130/62 | HR 63 | Temp 98.1°F | Ht 59.0 in | Wt 110.8 lb

## 2020-07-16 DIAGNOSIS — M810 Age-related osteoporosis without current pathological fracture: Secondary | ICD-10-CM

## 2020-07-16 DIAGNOSIS — E7800 Pure hypercholesterolemia, unspecified: Secondary | ICD-10-CM | POA: Diagnosis not present

## 2020-07-16 DIAGNOSIS — R7303 Prediabetes: Secondary | ICD-10-CM

## 2020-07-16 DIAGNOSIS — I10 Essential (primary) hypertension: Secondary | ICD-10-CM | POA: Diagnosis not present

## 2020-07-16 DIAGNOSIS — M4802 Spinal stenosis, cervical region: Secondary | ICD-10-CM

## 2020-07-16 DIAGNOSIS — H353230 Exudative age-related macular degeneration, bilateral, stage unspecified: Secondary | ICD-10-CM

## 2020-07-16 DIAGNOSIS — M5481 Occipital neuralgia: Secondary | ICD-10-CM | POA: Diagnosis not present

## 2020-07-16 DIAGNOSIS — E039 Hypothyroidism, unspecified: Secondary | ICD-10-CM | POA: Diagnosis not present

## 2020-07-16 LAB — LIPID PANEL
Cholesterol: 186 mg/dL (ref 0–200)
HDL: 78.5 mg/dL (ref >39.00)
LDL Cholesterol: 87 mg/dL (ref 0–99)
NonHDL: 107.88
Total CHOL/HDL Ratio: 2
Triglycerides: 105 mg/dL (ref 0.0–149.0)
VLDL: 21 mg/dL (ref 0.0–40.0)

## 2020-07-16 LAB — COMPREHENSIVE METABOLIC PANEL
ALT: 14 U/L (ref 0–35)
AST: 19 U/L (ref 0–37)
Albumin: 4.1 g/dL (ref 3.5–5.2)
Alkaline Phosphatase: 71 U/L (ref 39–117)
BUN: 16 mg/dL (ref 6–23)
CO2: 28 mEq/L (ref 19–32)
Calcium: 9.7 mg/dL (ref 8.4–10.5)
Chloride: 104 mEq/L (ref 96–112)
Creatinine, Ser: 0.68 mg/dL (ref 0.40–1.20)
GFR: 76.22 mL/min (ref >60.00)
Glucose, Bld: 91 mg/dL (ref 70–99)
Potassium: 4.1 mEq/L (ref 3.5–5.1)
Sodium: 140 mEq/L (ref 135–145)
Total Bilirubin: 0.7 mg/dL (ref 0.2–1.2)
Total Protein: 6.7 g/dL (ref 6.0–8.3)

## 2020-07-16 LAB — TSH: TSH: 1.83 u[IU]/mL (ref 0.35–5.50)

## 2020-07-16 LAB — HEMOGLOBIN A1C: Hgb A1c MFr Bld: 6 % (ref 4.6–6.5)

## 2020-07-16 MED ORDER — ZOSTER VAC RECOMB ADJUVANTED 50 MCG/0.5ML IM SUSR: 0.5000 mL | Freq: Once | INTRAMUSCULAR | 1 refills | Status: AC

## 2020-07-16 NOTE — Assessment & Plan Note (Signed)
Managed with pravastatin  Lab Results  Component Value Date   CHOL 143 10/09/2018   HDL 53.10 10/09/2018   LDLCALC 66 10/09/2018   TRIG 116.0 10/09/2018   CHOLHDL 3 10/09/2018

## 2020-07-16 NOTE — Patient Instructions (Addendum)
The medication Myrbetriq can cause constipation.  All bladder medications do this.  If it is not helping your overactive bladder,  you should stop the myrbetriq .   You can try using metamucil . Or citrucel for the constipation.  Constipation causes bloating     The gabapentin dose can be increased to 200 mg  every 8 hours for the neuropathy in your legs    Try using beano before any meal with any beans , cabbage,  Broccoli, cauliflower and brussel sprouts . This may help prevent the gas you are experiencing that makes you feel bloated.   Give the Shingrx prescription to the nurse at [facility] to see if she will give it you.  I will also send it to [facility]

## 2020-07-16 NOTE — Progress Notes (Signed)
Subjective:  Patient ID: Gloria Carr, female    DOB: 1927/02/13  Age: 87 y.o. MRN: [id number]  CC: The primary encounter diagnosis was Prediabetes. 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  HPI Analy Carr presents for FOLLOW UP ON HYPERTENSION .  SHE HAS MULTIPLE  COMPLAINTS /UPDATES  TODAY  1) recent accidentally inflicted SCALP WOUND : SCRAPED IT ON THE EDGE OF A CABINET DOOR.  BLED "LIKE CRAZY."  2) has been diagnosed with MACULAR DEGNERATION BILATERAL and GLAUCOMA LEFT EYE  by Dr Gloria Carr who is managing   Using systane, AREDS  3) history of fall after getting off a bus and stopping up onto the  curb. Using a walker now due to europathy of feet ;  feet feel "spongy" .  Treated by Gloria Carr's NP with gabapentin,  not helping,  fingers are also becoming numb.   Still able to stitch   4)urinary frequrency:  no change with Myrbetriq,  and feels that the med is making her constipated and distended .  Has gained 6 lbs.  Abdomen is enlarged.  Worried she has a tumor   5) HTN : does not check at home.  Tolerating amlodipine without side effects   Outpatient Medications Prior to Visit  Medication Sig Dispense Refill   amLODipine (NORVASC) 2.5 MG tablet TAKE 1 TABLET BY MOUTH DAILY AT NIGHT FOR BLOOD PRESSURE 90 tablet 0   Cholecalciferol (VITAMIN D3) 50 MCG (2000 UT) capsule Take 2,000 Units by mouth daily.     denosumab (PROLIA) 60 MG/ML SOSY injection Inject 60 mg into the skin every 6 (six) months.     fluticasone (FLONASE) 50 MCG/ACT nasal spray Place into both nostrils daily. Reported on 01/17/2015     gabapentin (NEURONTIN) 100 MG capsule Take 1 capsule by mouth with breakfast, with lunch, and with  evening meal.     latanoprost (XALATAN) 0.005 % ophthalmic solution INSTILL 1 DROP IN THE LEFT EYE AT NIGHT  5   levothyroxine (SYNTHROID) 50 MCG tablet TAKE 1 TABLET EVERY DAY ON EMPTY STOMACHWITH A GLASS OF WATER AT LEAST 30-60 MINBEFORE BREAKFAST 90 tablet 0   mirabegron ER (MYRBETRIQ) 50 MG TB24 tablet Take 1 tablet (50 mg total) by mouth daily. 30 tablet 11   mirabegron ER (MYRBETRIQ) 50 MG TB24 tablet Take 1 tablet (50 mg total) by mouth daily. 28 tablet 0   Misc Natural Products (OSTEO BI-FLEX JOINT SHIELD PO) Take by mouth.     Multiple Vitamins-Minerals (CENTRUM SILVER PO) Take by mouth.     Multiple Vitamins-Minerals (PRESERVISION AREDS 2 PO) Take by mouth.     pravastatin (PRAVACHOL) 20 MG tablet TAKE ONE TABLET BY MOUTH EVERY DAY 90 tablet 1   No facility-administered medications prior to visit.    Review of Systems;  Patient denies headache, fevers, malaise, unintentional weight loss, skin rash, eye pain, sinus congestion and sinus pain, sore throat, dysphagia,  hemoptysis , cough, dyspnea, wheezing, chest pain, palpitations, orthopnea, edema, abdominal pain, nausea, melena, diarrhea, constipation, flank pain, dysuria, hematuria, urinary  Frequency, nocturia, numbness, tingling, seizures,  Focal weakness, Loss of consciousness,  Tremor, insomnia, depression, anxiety, and suicidal ideation.      Objective:  BP 130/62 (BP Location: Left Arm, Patient Position: Sitting, Cuff Size: Normal)   Pulse 63   Temp 98.1 F (36.7 C) (  Oral)   Ht 4\' 11"  (1.499 m)   Wt 110 lb 12.8 oz (50.3 kg)   LMP  (LMP Unknown)   SpO2 97%   BMI 22.38 kg/m   BP Readings from Last 3 Encounters:  07/16/20 130/62  02/15/20 138/71  01/18/20 (!) 165/76    Wt Readings from Last 3 Encounters:  07/16/20 110 lb 12.8 oz (50.3 kg)  02/15/20 104 lb (47.2 kg)  01/18/20 108 lb (49 kg)    General appearance: alert, cooperative and appears stated age Ears: normal TM's and external ear canals both  ears Throat: lips, mucosa, and tongue normal; teeth and gums normal Neck: no adenopathy, no carotid bruit, supple, symmetrical, trachea midline and thyroid not enlarged, symmetric, no tenderness/mass/nodules Back: symmetric, no curvature. ROM normal. No CVA tenderness. Lungs: clear to auscultation bilaterally Heart: regular rate and rhythm, S1, S2 normal, no murmur, click, rub or gallop Abdomen: soft, non-tender; bowel sounds normal; no masses,  no organomegaly Pulses: 2+ and symmetric Skin: Skin color, texture, turgor normal. No rashes or lesions Lymph nodes: Cervical, supraclavicular, and axillary nodes normal.  Lab Results  Component Value Date   HGBA1C 6.0 07/16/2020   HGBA1C 5.7 04/10/2019   HGBA1C 5.9 01/26/2018    Lab Results  Component Value Date   CREATININE 0.68 07/16/2020   CREATININE 0.65 04/10/2019   CREATININE 0.67 10/17/2018    Lab Results  Component Value Date   WBC 7.4 10/09/2018   HGB 13.6 10/09/2018   HCT 41.6 10/09/2018   PLT 391.0 10/09/2018   GLUCOSE 91 07/16/2020   CHOL 186 07/16/2020   TRIG 105.0 07/16/2020   HDL 78.50 07/16/2020   LDLCALC 87 07/16/2020   ALT 14 07/16/2020   AST 19 07/16/2020   NA 140 07/16/2020   K 4.1 07/16/2020   CL 104 07/16/2020   CREATININE 0.68 07/16/2020   BUN 16 07/16/2020   CO2 28 07/16/2020   TSH 1.83 07/16/2020   HGBA1C 6.0 07/16/2020   MICROALBUR <0.7 11/25/2016    US Abdomen Limited RUQ  Result Date: 10/16/2018 CLINICAL DATA:  Elevated LFTs. EXAM: ULTRASOUND ABDOMEN LIMITED RIGHT UPPER QUADRANT COMPARISON:  CT dated 02/21/2018 FINDINGS: Gallbladder: No gallstones or wall thickening visualized. No sonographic Murphy sign noted by sonographer. Common bile duct: Diameter: 3 mm Liver: No focal lesion identified. Within normal limits in parenchymal echogenicity. Portal vein is patent on color Doppler imaging with normal direction of blood flow towards the liver. Other: None. IMPRESSION: Normal study. Electronically  Signed   By: Constance Holster M.D.   On: 10/16/2018 10:34    Assessment & Plan:   Problem List Items Addressed This Visit       Unprioritized   Acquired hypothyroidism    Thyroid function is WNL on current dose of 50 mcg levothyroxine daily .  No current changes needed.   Lab Results  Component Value Date   TSH 1.83 07/16/2020          Relevant Orders   TSH (Completed)   Bilateral occipital neuralgia   Degenerative cervical spinal stenosis    I suspect that her subjective complaint of intermittent bilateral hand numbness is due to  Cervical spine DDD.  Given her age,  I would not recommend pursuing further workup unless she develops objective findings        Essential hypertension    Well controlled on current regimen of amlodipine . Renal function stable, no changes today.  Lab Results  Component Value Date   CREATININE 0.68  07/16/2020   Lab Results  Component Value Date   NA 140 07/16/2020   K 4.1 07/16/2020   CL 104 07/16/2020   CO2 28 07/16/2020          Relevant Orders   Comprehensive metabolic panel (Completed)   Macular degeneration, bilateral    Managed by Dr Gloria Carr.  Records requested. She lives alone       Osteoporosis    With history of pelvic fractures. Continue Prolia every 6 months.  Last DEXA Dec 2017       Relevant Medications   Cholecalciferol (VITAMIN D3) 50 MCG (2000 UT) capsule   Other Relevant Orders   DG Bone Density   Prediabetes - Primary   Relevant Orders   Hemoglobin A1c (Completed)   Pure hypercholesterolemia    Managed with pravastatin  Lab Results  Component Value Date   CHOL 143 10/09/2018   HDL 53.10 10/09/2018   LDLCALC 66 10/09/2018   TRIG 116.0 10/09/2018   CHOLHDL 3 10/09/2018          Relevant Orders   Lipid panel (Completed)   I spent 30 mint\utes dedicated to the care of this patient on the date of this encounter to include pre-visit review of her management of hypertension, osteoporosis,   iscussing her current complaints ,  Face-to-face time with the patient , and post visit ordering of testing and therapeutics.   I am having Gloria Carr start on Zoster Vaccine Adjuvanted and Zoster Vaccine Adjuvanted. I am also having her maintain her Multiple Vitamins-Minerals (PRESERVISION AREDS 2 PO), Multiple Vitamins-Minerals (CENTRUM SILVER PO), Misc Natural Products (OSTEO BI-FLEX JOINT SHIELD PO), fluticasone, latanoprost, denosumab, mirabegron ER, mirabegron ER, pravastatin, levothyroxine, amLODipine, gabapentin, and Vitamin D3.  Meds ordered this encounter  Medications   Zoster Vaccine Adjuvanted [facility]) injection    Sig: Inject 0.5 mLs into the muscle once for 1 dose.    Dispense:  1 each    Refill:  1   Zoster Vaccine Adjuvanted [facility]) injection    Sig: Inject 0.5 mLs into the muscle once for 1 dose.    Dispense:  1 each    Refill:  1    SEND TO RN AT [facility]  FOR BOTH DOSES    There are no discontinued medications.  Follow-up: No follow-ups on file.   Crecencio Mc, MD

## 2020-07-19 DIAGNOSIS — H3530 Unspecified macular degeneration: Secondary | ICD-10-CM | POA: Insufficient documentation

## 2020-07-19 NOTE — Assessment & Plan Note (Signed)
Thyroid function is WNL on current dose of 50 mcg levothyroxine daily .  No current changes needed.   Lab Results  Component Value Date   TSH 1.83 07/16/2020

## 2020-07-19 NOTE — Assessment & Plan Note (Signed)
Well controlled on current regimen of amlodipine . Renal function stable, no changes today.  Lab Results  Component Value Date   CREATININE 0.68 07/16/2020   Lab Results  Component Value Date   NA 140 07/16/2020   K 4.1 07/16/2020   CL 104 07/16/2020   CO2 28 07/16/2020

## 2020-07-19 NOTE — Assessment & Plan Note (Signed)
I suspect that her subjective complaint of intermittent bilateral hand numbness is due to  Cervical spine DDD.  Given her age,  I would not recommend pursuing further workup unless she develops objective findings

## 2020-07-19 NOTE — Assessment & Plan Note (Signed)
With history of pelvic fractures. Continue Prolia every 6 months.  Last DEXA Dec 2017

## 2020-07-19 NOTE — Assessment & Plan Note (Signed)
Managed by Dr Mordecai Rasmussen.  Records requested. She lives alone

## 2020-07-22 DIAGNOSIS — D2271 Melanocytic nevi of right lower limb, including hip: Secondary | ICD-10-CM | POA: Diagnosis not present

## 2020-07-22 DIAGNOSIS — D2261 Melanocytic nevi of right upper limb, including shoulder: Secondary | ICD-10-CM | POA: Diagnosis not present

## 2020-07-22 DIAGNOSIS — L821 Other seborrheic keratosis: Secondary | ICD-10-CM | POA: Diagnosis not present

## 2020-07-22 DIAGNOSIS — C44319 Basal cell carcinoma of skin of other parts of face: Secondary | ICD-10-CM | POA: Diagnosis not present

## 2020-07-22 DIAGNOSIS — D485 Neoplasm of uncertain behavior of skin: Secondary | ICD-10-CM | POA: Diagnosis not present

## 2020-07-22 DIAGNOSIS — D2272 Melanocytic nevi of left lower limb, including hip: Secondary | ICD-10-CM | POA: Diagnosis not present

## 2020-07-22 DIAGNOSIS — D2262 Melanocytic nevi of left upper limb, including shoulder: Secondary | ICD-10-CM | POA: Diagnosis not present

## 2020-07-22 DIAGNOSIS — D225 Melanocytic nevi of trunk: Secondary | ICD-10-CM | POA: Diagnosis not present

## 2020-08-05 DIAGNOSIS — G608 Other hereditary and idiopathic neuropathies: Secondary | ICD-10-CM | POA: Diagnosis not present

## 2020-08-05 DIAGNOSIS — L603 Nail dystrophy: Secondary | ICD-10-CM | POA: Diagnosis not present

## 2020-08-05 DIAGNOSIS — I7091 Generalized atherosclerosis: Secondary | ICD-10-CM | POA: Diagnosis not present

## 2020-08-07 DIAGNOSIS — M25531 Pain in right wrist: Secondary | ICD-10-CM | POA: Diagnosis not present

## 2020-08-07 DIAGNOSIS — M13831 Other specified arthritis, right wrist: Secondary | ICD-10-CM | POA: Diagnosis not present

## 2020-08-26 DIAGNOSIS — G609 Hereditary and idiopathic neuropathy, unspecified: Secondary | ICD-10-CM | POA: Diagnosis not present

## 2020-08-26 DIAGNOSIS — M5481 Occipital neuralgia: Secondary | ICD-10-CM | POA: Diagnosis not present

## 2020-09-01 ENCOUNTER — Encounter: Payer: Self-pay | Admitting: Internal Medicine

## 2020-09-02 ENCOUNTER — Telehealth: Payer: Self-pay | Admitting: Internal Medicine

## 2020-09-02 NOTE — Telephone Encounter (Signed)
Patient needs appointment for Prolia injection Nurse visit . Patient has missed last injection.

## 2020-09-04 DIAGNOSIS — C44319 Basal cell carcinoma of skin of other parts of face: Secondary | ICD-10-CM | POA: Diagnosis not present

## 2020-09-04 NOTE — Telephone Encounter (Signed)
Pt called and said she will get with her son about when he can bring her. FYI

## 2020-09-04 NOTE — Telephone Encounter (Signed)
PT has been scheduled on 9/8 for prolia injection

## 2020-09-18 ENCOUNTER — Other Ambulatory Visit: Payer: Self-pay

## 2020-09-18 ENCOUNTER — Ambulatory Visit (INDEPENDENT_AMBULATORY_CARE_PROVIDER_SITE_OTHER): Payer: PPO

## 2020-09-18 DIAGNOSIS — M810 Age-related osteoporosis without current pathological fracture: Secondary | ICD-10-CM | POA: Diagnosis not present

## 2020-09-18 MED ORDER — DENOSUMAB 60 MG/ML ~~LOC~~ SOSY
60.0000 mg | PREFILLED_SYRINGE | Freq: Once | SUBCUTANEOUS | Status: AC
  Administered 2020-09-18: 60 mg via SUBCUTANEOUS

## 2020-09-18 NOTE — Progress Notes (Signed)
Patient presented for 6-month Prolia injection SQ to left arm. Patient tolerated well. 

## 2020-10-07 DIAGNOSIS — I7091 Generalized atherosclerosis: Secondary | ICD-10-CM | POA: Diagnosis not present

## 2020-10-07 DIAGNOSIS — L603 Nail dystrophy: Secondary | ICD-10-CM | POA: Diagnosis not present

## 2020-10-09 ENCOUNTER — Other Ambulatory Visit: Payer: PPO

## 2020-10-10 ENCOUNTER — Telehealth: Payer: Self-pay | Admitting: *Deleted

## 2020-10-10 DIAGNOSIS — N3281 Overactive bladder: Secondary | ICD-10-CM

## 2020-10-10 NOTE — Telephone Encounter (Signed)
Pt calling asking if she can start back on Oxybutynin? Pt can not afford Myrbetriq, she is getting up several times at night. Please advise

## 2020-10-14 MED ORDER — TROSPIUM CHLORIDE ER 60 MG PO CP24: 1.0000 | ORAL_CAPSULE | Freq: Every day | ORAL | 0 refills | Status: DC

## 2020-10-14 NOTE — Telephone Encounter (Signed)
Patient is agreeable with plans to proceed with Trospium and follow up in 1 mo. Trospium Rx sent, 1 mo appt confirmed.

## 2020-10-14 NOTE — Addendum Note (Signed)
Addended by: Evelina Bucy on: 10/14/2020 02:53 PM   Modules accepted: Orders

## 2020-10-14 NOTE — Telephone Encounter (Signed)
No, she had bothersome dry mouth and incomplete bladder emptying on oxybutynin. We can try trospium ER 60mg  as an alternative, but would want to watch her closely with plans for symptom recheck and PVR in 1 month on this medication. Otherwise recommend second opinion with Dr. Matilde Sprang.

## 2020-10-17 ENCOUNTER — Telehealth: Payer: Self-pay | Admitting: Internal Medicine

## 2020-10-17 NOTE — Telephone Encounter (Signed)
Patient calling to inquire about her shingles shot. States she had one one at [facility] recently. Wanting to know when she needs another. Did not see this shot in her chart.   Patient states she will be getting her flu shot and Covid booster through [facility] next week.   Please advise

## 2020-10-17 NOTE — Telephone Encounter (Signed)
Spoke with pt to let her know that the second shingle vaccine needs to be 2 to 6 months after the first vaccine. Also advised pt that if she was going to her covid and flu next week that she should wait at least two weeks before getting the shingles vaccine. Pt gave a verbal understanding.

## 2020-10-21 ENCOUNTER — Other Ambulatory Visit: Payer: Self-pay

## 2020-10-21 ENCOUNTER — Ambulatory Visit
Admission: RE | Admit: 2020-10-21 | Discharge: 2020-10-21 | Disposition: A | Discharge status: Home / Self Care | Payer: PPO | Source: Ambulatory Visit | Attending: Internal Medicine | Admitting: Internal Medicine

## 2020-10-21 DIAGNOSIS — M810 Age-related osteoporosis without current pathological fracture: Secondary | ICD-10-CM | POA: Diagnosis not present

## 2020-10-21 DIAGNOSIS — Z780 Asymptomatic menopausal state: Secondary | ICD-10-CM | POA: Diagnosis not present

## 2020-10-31 ENCOUNTER — Other Ambulatory Visit: Payer: Self-pay | Admitting: Family Medicine

## 2020-11-03 ENCOUNTER — Telehealth: Payer: Self-pay | Admitting: Internal Medicine

## 2020-11-03 NOTE — Telephone Encounter (Signed)
Patient would like to know if her b-12 injections are in office or if they can be a prescription so she can see how much she paid for it.She is unsure if there will be copay and her insurance will cover it for 2023.Please advise.

## 2020-11-05 NOTE — Telephone Encounter (Signed)
LMTCB

## 2020-11-05 NOTE — Telephone Encounter (Signed)
Spoke with pt and she stated that she was asking about her Prolia injection not b12 injections. Pt wanted to know how much she pays for the prolia injection because she has not gotten anything from her insurance. After speaking to [place], RN I let pt know that it is estimated that pt will pay around $320 per 6 months. Pt gave a verbal understanding.

## 2020-11-12 ENCOUNTER — Encounter: Payer: Self-pay | Admitting: Physician Assistant

## 2020-11-12 ENCOUNTER — Ambulatory Visit: Payer: PPO | Admitting: Physician Assistant

## 2020-11-12 ENCOUNTER — Other Ambulatory Visit: Payer: Self-pay

## 2020-11-12 VITALS — BP 142/92 | HR 69 | Ht 59.0 in | Wt 100.0 lb

## 2020-11-12 DIAGNOSIS — N3281 Overactive bladder: Secondary | ICD-10-CM

## 2020-11-12 LAB — BLADDER SCAN AMB NON-IMAGING: Scan Result: 99

## 2020-11-12 MED ORDER — TROSPIUM CHLORIDE ER 60 MG PO CP24: 1.0000 | ORAL_CAPSULE | Freq: Every day | ORAL | 11 refills | Status: DC

## 2020-11-12 NOTE — Progress Notes (Signed)
11/12/2020 2:39 PM   Gloria Carr August 25, 1927 [id number]  CC: Chief Complaint  Patient presents with   Other   HPI: Gloria Carr is a 87 y.o. female with PMH OAB who developed incomplete bladder emptying on oxybutynin and microscopic hematuria with negative work-up in 2020 who presents today for recheck and PVR on trospium ER 60 mg daily.  She is accompanied today by her son, who contributes to HPI.  Today she reports improvement in nocturia on trospium, now 1-2 times nightly.  She is still using 2-3 pads daily and they are sometimes saturated if she has bothersome urgency.  PVR 99 mL.  She reports she is taking numerous medications and this is bothersome for her.  PMH: Past Medical History:  Diagnosis Date   Allergy    Arthritis    Glaucoma    History of chickenpox    History of recurrent UTIs    Hypertension    Macular degeneration    Thyroid disease     Surgical History: Past Surgical History:  Procedure Laterality Date   BILATERAL CARPAL TUNNEL RELEASE  1999-2000   CATARACT EXTRACTION, BILATERAL  2013    Home Medications:  Allergies as of 11/12/2020       Reactions   Sulfa Antibiotics Other (See Comments)   Childhood reaction         Medication List        Accurate as of November 12, 2020  2:39 PM. If you have any questions, ask your nurse or doctor.          amLODipine 2.5 MG tablet Commonly known as: NORVASC TAKE 1 TABLET BY MOUTH DAILY AT NIGHT FOR BLOOD PRESSURE   denosumab 60 MG/ML Sosy injection Commonly known as: PROLIA Inject 60 mg into the skin every 6 (six) months.   fluticasone 50 MCG/ACT nasal spray Commonly known as: FLONASE Place into both nostrils daily. Reported on 01/17/2015   gabapentin 100 MG capsule Commonly known as: NEURONTIN Take 1 capsule by mouth with breakfast, with lunch, and with evening meal.   latanoprost 0.005 % ophthalmic solution Commonly known as: XALATAN INSTILL 1 DROP IN THE LEFT EYE AT NIGHT    levothyroxine 50 MCG tablet Commonly known as: SYNTHROID TAKE 1 TABLET EVERY DAY ON EMPTY STOMACHWITH A GLASS OF WATER AT LEAST 30-60 MINBEFORE BREAKFAST   OSTEO BI-FLEX JOINT SHIELD PO Take by mouth.   pravastatin 20 MG tablet Commonly known as: PRAVACHOL TAKE 1 TABLET BY MOUTH DAILY   PRESERVISION AREDS 2 PO Take by mouth.   CENTRUM SILVER PO Take by mouth.   Trospium Chloride 60 MG Cp24 Take 1 capsule (60 mg total) by mouth daily.   Vitamin D3 50 MCG (2000 UT) capsule Take 2,000 Units by mouth daily.        Allergies:  Allergies  Allergen Reactions   Sulfa Antibiotics Other (See Comments)    Childhood reaction     Family History: Family History  Problem Relation Age of Onset   Stroke Mother    Cancer Mother 44       colon   Cancer Father        bone   Cancer Son        Jaw   Stroke Sister    Breast cancer Neg Hx     Social History:   reports that she has quit smoking. She has never used smokeless tobacco. She reports current alcohol use of about 5.0 standard drinks per week. She reports that  she does not use drugs.  Physical Exam: BP (!) 142/92   Pulse 69   Ht 4\' 11"  (1.499 m)   Wt 100 lb (45.4 kg)   LMP  (LMP Unknown)   BMI 20.20 kg/m   Constitutional:  Alert and oriented, no acute distress, nontoxic appearing HEENT: [place], AT Cardiovascular: No clubbing, cyanosis, or edema Respiratory: Normal respiratory effort, no increased work of breathing Skin: No rashes, bruises or suspicious lesions Neurologic: Grossly intact, no focal deficits, moving all 4 extremities Psychiatric: Normal mood and affect  Laboratory Data: Results for orders placed or performed in visit on 11/12/20  Bladder Scan (Post Void Residual) in office  Result Value Ref Range   Scan Result 99    Assessment & Plan:   1. OAB (overactive bladder) Nocturia improved on trospium, we will plan to continue this given normal PVR.  We discussed PTNS as a nonpharmacotherapy alternative,  however patient reports she would have difficulty getting to clinic on a regular basis for this.  May consider this again in the future if she continues to be bothered by her number of medications. - Bladder Scan (Post Void Residual) in office - Trospium Chloride 60 MG CP24; Take 1 capsule (60 mg total) by mouth daily.  Dispense: 30 capsule; Refill: 11  Return in about 1 year (around 11/12/2021) for Annual OAB f/u with PVR.  Debroah Loop, PA-C  [facility] [address] [place], [place] [address] [phone]

## 2020-11-17 ENCOUNTER — Telehealth: Payer: Self-pay | Admitting: Internal Medicine

## 2020-11-17 NOTE — Telephone Encounter (Signed)
Placed in quick sign folder for signature.  

## 2020-11-17 NOTE — Telephone Encounter (Signed)
Patient dropped off a handicapp renewal form. Form is up front in Dr Lupita Dawn color folder.

## 2020-11-18 NOTE — Telephone Encounter (Signed)
Pt called in requesting update on forms that was dropped off by her son. Advise Pt of last note (Spoke with Ronalee Belts and let him know that the form is ready for pick up. He gave a verbal understanding.) Pt wasn't to happy about we not mailing her forms off. Pt stated that she would have to reach out to son to see if he can pickup the forms.

## 2020-11-18 NOTE — Telephone Encounter (Signed)
Please call Jacquette Canales [phone] to pick up form do not mail.

## 2020-11-18 NOTE — Telephone Encounter (Signed)
Spoke with Gloria Carr and let him know that the form is ready for pick up. He gave a verbal understanding.

## 2020-11-19 NOTE — Telephone Encounter (Signed)
noted 

## 2020-12-15 ENCOUNTER — Telehealth: Payer: Self-pay

## 2020-12-15 NOTE — Telephone Encounter (Signed)
Unfortunately Myrbetriq was cost prohibitive for her and she developed incomplete bladder emptying on oxybutynin, so her pharmacologic options at this point given her age are rather limited. I still think it would be worth pursuing PTNS as discussed in clinic; alternatively could have her see Dr. Matilde Sprang for a second opinion.

## 2020-12-15 NOTE — Telephone Encounter (Signed)
Pt calls triage line and states that she is due for a refill on Trospium, but does not want to refill as it is expensive and she does not feel that it has improved her symptoms any. She is still waking up several times at night. She has failed Myrbetriq in the past. She questions if there are other alternatives or if this is just something she needs to learn to manage. Please advise.

## 2020-12-16 NOTE — Telephone Encounter (Signed)
Patient has decided she has a few more days of the Trospium left and after they are gone, she is going to try to go without for a week or two. She goes on to say that she will call back if she cannot manage her nocturia to see Dr Matilde Sprang.

## 2020-12-21 ENCOUNTER — Other Ambulatory Visit: Payer: Self-pay | Admitting: Internal Medicine

## 2020-12-23 ENCOUNTER — Other Ambulatory Visit: Payer: Self-pay | Admitting: Internal Medicine

## 2020-12-26 ENCOUNTER — Ambulatory Visit (INDEPENDENT_AMBULATORY_CARE_PROVIDER_SITE_OTHER): Payer: PPO

## 2020-12-26 VITALS — Ht 59.0 in | Wt 100.0 lb

## 2020-12-26 DIAGNOSIS — Z0000 Encounter for general adult medical examination without abnormal findings: Secondary | ICD-10-CM

## 2020-12-26 NOTE — Patient Instructions (Addendum)
Gloria Carr , Thank you for taking time to come for your Medicare Wellness Visit. I appreciate your ongoing commitment to your health goals. Please review the following plan we discussed and let me know if I can assist you in the future.   These are the goals we discussed:  Goals      Follow up with Provider as scheduled        This is a list of the screening recommended for you and due dates:  Health Maintenance  Topic Date Due   Flu Shot  04/10/2021*   Tetanus Vaccine  12/10/2025   Pneumonia Vaccine  Completed   DEXA scan (bone density measurement)  Completed   COVID-19 Vaccine  Completed   Zoster (Shingles) Vaccine  Completed   HPV Vaccine  Aged Out  *Topic was postponed. The date shown is not the original due date.   Advanced directives: on file  Follow up in one year for your annual wellness visit    Preventive Care 65 Years and Older, Female Preventive care refers to lifestyle choices and visits with your health care provider that can promote health and wellness. What does preventive care include? A yearly physical exam. This is also called an annual well check. Dental exams once or twice a year. Routine eye exams. Ask your health care provider how often you should have your eyes checked. Personal lifestyle choices, including: Daily care of your teeth and gums. Regular physical activity. Eating a healthy diet. Avoiding tobacco and drug use. Limiting alcohol use. Practicing safe sex. Taking low-dose aspirin every day. Taking vitamin and mineral supplements as recommended by your health care provider. What happens during an annual well check? The services and screenings done by your health care provider during your annual well check will depend on your age, overall health, lifestyle risk factors, and family history of disease. Counseling  Your health care provider may ask you questions about your: Alcohol use. Tobacco use. Drug use. Emotional well-being. Home and  relationship well-being. Sexual activity. Eating habits. History of falls. Memory and ability to understand (cognition). Work and work Statistician. Reproductive health. Screening  You may have the following tests or measurements: Height, weight, and BMI. Blood pressure. Lipid and cholesterol levels. These may be checked every 5 years, or more frequently if you are over 62 years old. Skin check. Lung cancer screening. You may have this screening every year starting at age 41 if you have a 30-pack-year history of smoking and currently smoke or have quit within the past 15 years. Fecal occult blood test (FOBT) of the stool. You may have this test every year starting at age 85. Flexible sigmoidoscopy or colonoscopy. You may have a sigmoidoscopy every 5 years or a colonoscopy every 10 years starting at age 68. Hepatitis C blood test. Hepatitis B blood test. Sexually transmitted disease (STD) testing. Diabetes screening. This is done by checking your blood sugar (glucose) after you have not eaten for a while (fasting). You may have this done every 1-3 years. Bone density scan. This is done to screen for osteoporosis. You may have this done starting at age 103. Mammogram. This may be done every 1-2 years. Talk to your health care provider about how often you should have regular mammograms. Talk with your health care provider about your test results, treatment options, and if necessary, the need for more tests. Vaccines  Your health care provider may recommend certain vaccines, such as: Influenza vaccine. This is recommended every year. Tetanus, diphtheria,  and acellular pertussis (Tdap, Td) vaccine. You may need a Td booster every 10 years. Zoster vaccine. You may need this after age 74. Pneumococcal 13-valent conjugate (PCV13) vaccine. One dose is recommended after age 25. Pneumococcal polysaccharide (PPSV23) vaccine. One dose is recommended after age 79. Talk to your health care provider  about which screenings and vaccines you need and how often you need them. This information is not intended to replace advice given to you by your health care provider. Make sure you discuss any questions you have with your health care provider. Document Released: 01/24/2015 Document Revised: 09/17/2015 Document Reviewed: 10/29/2014 Elsevier Interactive Patient Education  2017 [place] Prevention in the Home Falls can cause injuries. They can happen to people of all ages. There are many things you can do to make your home safe and to help prevent falls. What can I do on the outside of my home? Regularly fix the edges of walkways and driveways and fix any cracks. Remove anything that might make you trip as you walk through a door, such as a raised step or threshold. Trim any bushes or trees on the path to your home. Use bright outdoor lighting. Clear any walking paths of anything that might make someone trip, such as rocks or tools. Regularly check to see if handrails are loose or broken. Make sure that both sides of any steps have handrails. Any raised decks and porches should have guardrails on the edges. Have any leaves, snow, or ice cleared regularly. Use sand or salt on walking paths during winter. Clean up any spills in your garage right away. This includes oil or grease spills. What can I do in the bathroom? Use night lights. Install grab bars by the toilet and in the tub and shower. Do not use towel bars as grab bars. Use non-skid mats or decals in the tub or shower. If you need to sit down in the shower, use a plastic, non-slip stool. Keep the floor dry. Clean up any water that spills on the floor as soon as it happens. Remove soap buildup in the tub or shower regularly. Attach bath mats securely with double-sided non-slip rug tape. Do not have throw rugs and other things on the floor that can make you trip. What can I do in the bedroom? Use night lights. Make sure  that you have a light by your bed that is easy to reach. Do not use any sheets or blankets that are too big for your bed. They should not hang down onto the floor. Have a firm chair that has side arms. You can use this for support while you get dressed. Do not have throw rugs and other things on the floor that can make you trip. What can I do in the kitchen? Clean up any spills right away. Avoid walking on wet floors. Keep items that you use a lot in easy-to-reach places. If you need to reach something above you, use a strong step stool that has a grab bar. Keep electrical cords out of the way. Do not use floor polish or wax that makes floors slippery. If you must use wax, use non-skid floor wax. Do not have throw rugs and other things on the floor that can make you trip. What can I do with my stairs? Do not leave any items on the stairs. Make sure that there are handrails on both sides of the stairs and use them. Fix handrails that are broken or loose. Make sure  that handrails are as long as the stairways. Check any carpeting to make sure that it is firmly attached to the stairs. Fix any carpet that is loose or worn. Avoid having throw rugs at the top or bottom of the stairs. If you do have throw rugs, attach them to the floor with carpet tape. Make sure that you have a light switch at the top of the stairs and the bottom of the stairs. If you do not have them, ask someone to add them for you. What else can I do to help prevent falls? Wear shoes that: Do not have high heels. Have rubber bottoms. Are comfortable and fit you well. Are closed at the toe. Do not wear sandals. If you use a stepladder: Make sure that it is fully opened. Do not climb a closed stepladder. Make sure that both sides of the stepladder are locked into place. Ask someone to hold it for you, if possible. Clearly mark and make sure that you can see: Any grab bars or handrails. First and last steps. Where the edge of  each step is. Use tools that help you move around (mobility aids) if they are needed. These include: Canes. Walkers. Scooters. Crutches. Turn on the lights when you go into a dark area. Replace any light bulbs as soon as they burn out. Set up your furniture so you have a clear path. Avoid moving your furniture around. If any of your floors are uneven, fix them. If there are any pets around you, be aware of where they are. Review your medicines with your doctor. Some medicines can make you feel dizzy. This can increase your chance of falling. Ask your doctor what other things that you can do to help prevent falls. This information is not intended to replace advice given to you by your health care provider. Make sure you discuss any questions you have with your health care provider. Document Released: 10/24/2008 Document Revised: 06/05/2015 Document Reviewed: 02/01/2014 Elsevier Interactive Patient Education  2017 [employer].

## 2020-12-26 NOTE — Progress Notes (Addendum)
Subjective:   Gloria Carr is a 87 y.o. female who presents for Medicare Annual (Subsequent) preventive examination.  Review of Systems    No ROS.  Medicare Wellness Virtual Visit.  Visual/audio telehealth visit, UTA vital signs.   See social history for additional risk factors.   Cardiac Risk Factors include: advanced age (>37men, >40 women)     Objective:    Today's Vitals   12/26/20 1232  Weight: 100 lb (45.4 kg)  Height: 4\' 11"  (1.499 m)   Body mass index is 20.2 kg/m.  Advanced Directives 12/26/2020 12/26/2019 12/25/2018 02/21/2018 12/21/2017 12/09/2016 12/10/2015  Does Patient Have a Medical Advance Directive? Yes Yes Yes No Yes Yes Yes  Type of Paramedic of [place];Living will [place];Living will [place];Living will - [place];Living will [place];Living will Living will;Healthcare Power of Attorney  Does patient want to make changes to medical advance directive? No - Patient declined No - Patient declined No - Patient declined - No - Patient declined No - Patient declined -  Copy of [place] in Chart? Yes - validated most recent copy scanned in chart (See row information) Yes - validated most recent copy scanned in chart (See row information) Yes - validated most recent copy scanned in chart (See row information) - Yes - validated most recent copy scanned in chart (See row information) No - copy requested No - copy requested  Would patient like information on creating a medical advance directive? - - - No - Patient declined - - -    Current Medications (verified) Outpatient Encounter Medications as of 12/26/2020  Medication Sig   amLODipine (NORVASC) 2.5 MG tablet TAKE 1 TABLET BY MOUTH DAILY AT NIGHT FOR BLOOD PRESSURE   Cholecalciferol (VITAMIN D3) 50 MCG (2000 UT) capsule Take 2,000 Units by mouth daily.   denosumab (PROLIA) 60 MG/ML SOSY  injection Inject 60 mg into the skin every 6 (six) months.   fluticasone (FLONASE) 50 MCG/ACT nasal spray Place into both nostrils daily. Reported on 01/17/2015   gabapentin (NEURONTIN) 100 MG capsule Take 1 capsule by mouth with breakfast, with lunch, and with evening meal.   latanoprost (XALATAN) 0.005 % ophthalmic solution INSTILL 1 DROP IN THE LEFT EYE AT NIGHT   levothyroxine (SYNTHROID) 50 MCG tablet TAKE 1 TABLET EVERY DAY ON EMPTY STOMACHWITH A GLASS OF WATER AT LEAST 30-60 MINBEFORE BREAKFAST   Misc Natural Products (OSTEO BI-FLEX JOINT SHIELD PO) Take by mouth.   Multiple Vitamins-Minerals (CENTRUM SILVER PO) Take by mouth.   Multiple Vitamins-Minerals (PRESERVISION AREDS 2 PO) Take by mouth.   pravastatin (PRAVACHOL) 20 MG tablet TAKE 1 TABLET BY MOUTH DAILY   Trospium Chloride 60 MG CP24 Take 1 capsule (60 mg total) by mouth daily.   No facility-administered encounter medications on file as of 12/26/2020.    Allergies (verified) Sulfa antibiotics   History: Past Medical History:  Diagnosis Date   Allergy    Arthritis    Glaucoma    History of chickenpox    History of recurrent UTIs    Hypertension    Macular degeneration    Thyroid disease    Past Surgical History:  Procedure Laterality Date   BILATERAL CARPAL TUNNEL RELEASE  1999-2000   CATARACT EXTRACTION, BILATERAL  2013   Family History  Problem Relation Age of Onset   Stroke Mother    Cancer Mother 10       colon  Cancer Father        bone   Cancer Son        Jaw   Stroke Sister    Breast cancer Neg Hx    Social History   Socioeconomic History   Marital status: Widowed    Spouse name: Not on file   Number of children: Not on file   Years of education: Not on file   Highest education level: Not on file  Occupational History   Not on file  Tobacco Use   Smoking status: Former   Smokeless tobacco: Never  Vaping Use   Vaping Use: Never used  Substance and Sexual Activity   Alcohol use: Yes     Alcohol/week: 5.0 standard drinks    Types: 5 Standard drinks or equivalent per week    Comment: with dinner   Drug use: No   Sexual activity: Not Currently    Birth control/protection: Post-menopausal  Other Topics Concern   Not on file  Social History Narrative   Not on file   Social Determinants of Health   Financial Resource Strain: Low Risk    Difficulty of Paying Living Expenses: Not hard at all  Food Insecurity: No Food Insecurity   Worried About Charity fundraiser in the Last Year: Never true   [place] in the Last Year: Never true  Transportation Needs: No Transportation Needs   Lack of Transportation (Medical): No   Lack of Transportation (Non-Medical): No  Physical Activity: Insufficiently Active   Days of Exercise per Week: 2 days   Minutes of Exercise per Session: 50 min  Stress: No Stress Concern Present   Feeling of Stress : Not at all  Social Connections: Unknown   Frequency of Communication with Friends and Family: Not on file   Frequency of Social Gatherings with Friends and Family: More than three times a week   Attends Religious Services: Not on Electrical engineer or Organizations: Not on file   Attends Archivist Meetings: Not on file   Marital Status: Not on file    Tobacco Counseling Counseling given: Not Answered   Clinical Intake:  Pre-visit preparation completed: Yes        Diabetes: No  How often do you need to have someone help you when you read instructions, pamphlets, or other written materials from your doctor or pharmacy?: 2 - Rarely  Interpreter Needed?: No      Activities of Daily Living In your present state of health, do you have any difficulty performing the following activities: 12/26/2020  Hearing? N  Vision? Y  Comment Macular degeneration. Glaucoma.  Difficulty concentrating or making decisions? N  Walking or climbing stairs? Y  Comment Walker in use when ambulating. Paces self.   Dressing or bathing? N  Doing errands, shopping? Y  Comment Does not drive  Preparing Food and eating ? N  Using the Toilet? N  In the past six months, have you accidently leaked urine? Y  Comment Followed by Urologist and pcp. Hx OVA bladder. Managed with daily brief.  Do you have problems with loss of bowel control? N  Managing your Medications? N  Managing your Finances? N  Housekeeping or managing your Housekeeping? N  Some recent data might be hidden    Patient Care Team: Crecencio Mc, MD as PCP - General (Internal Medicine)  Indicate any recent Medical Services you may have received from other than [facility] providers in  the past year (date may be approximate).     Assessment:   This is a routine wellness examination for [place].  Virtual Visit via Telephone Note  I connected with  Shanik Brookshire on 12/26/20 at 12:30 PM EST by telephone and verified that I am speaking with the correct person using two identifiers.  Persons participating in the virtual visit: patient/Nurse Health Advisor   I discussed the limitations, risks, security and privacy concerns of performing an evaluation and management service by telephone and the availability of in person appointments. The patient expressed understanding and agreed to proceed.  Interactive audio and video telecommunications were attempted between this nurse and patient, however failed, due to patient having technical difficulties OR patient did not have access to video capability.  We continued and completed visit with audio only.  Some vital signs may be absent or patient reported.   Hearing/Vision screen Hearing Screening - Comments:: Patient is able to hear conversational tones without difficulty. No issues reported. Vision Screening - Comments:: Wears corrective lenses Cataract extraction, bilateral Macular degeneration Glaucoma They have seen their ophthalmologist.   Dietary issues and exercise activities  discussed: Current Exercise Habits: Home exercise routine, Intensity: Mild Regular diet   Goals Addressed             This Visit's Progress    Follow up with Provider as scheduled         Depression Screen PHQ 2/9 Scores 12/26/2020 07/16/2020 12/26/2019 12/25/2018 07/31/2018 12/21/2017 12/09/2016  PHQ - 2 Score 0 0 0 0 0 0 0  PHQ- 9 Score - - - - 0 - -    Fall Risk Fall Risk  12/26/2020 07/16/2020 12/26/2019 04/10/2019 12/25/2018  Falls in the past year? 0 1 0 0 0  Comment - - - - -  Number falls in past yr: - 0 0 - -  Injury with Fall? - 0 0 - -  Risk for fall due to : - History of fall(s) - - -  Follow up Falls evaluation completed Falls evaluation completed Falls evaluation completed Falls evaluation completed Falls prevention discussed    FALL RISK PREVENTION PERTAINING TO THE HOME: Home free of loose throw rugs in walkways, pet beds, electrical cords, etc? Yes  Adequate lighting in your home to reduce risk of falls? Yes   ASSISTIVE DEVICES UTILIZED TO PREVENT FALLS: Life alert? Yes  Use of a cane, walker or w/c? Yes  Grab bars in the bathroom? Yes  Shower chair or bench in shower? No  Elevated toilet seat or a handicapped toilet? No   TIMED UP AND GO: Was the test performed? No .   Cognitive Function: MMSE - Mini Mental State Exam 12/09/2016 12/10/2014  Orientation to time 5 5  Orientation to Place 5 5  Registration 3 3  Attention/ Calculation 5 5  Recall 2 3  Language- name 2 objects 2 2  Language- repeat 1 1  Language- follow 3 step command 3 3  Language- read & follow direction 1 1  Write a sentence 1 1  Copy design 1 1  Total score 29 30     6CIT Screen 12/26/2020 12/26/2019 12/25/2018 12/21/2017 12/10/2015  What Year? 0 points 0 points 0 points 0 points 0 points  What month? 0 points 0 points 0 points 0 points 0 points  What time? 0 points 0 points 0 points 0 points 0 points  Count back from 20 - 0 points 0 points 0 points 0 points  Months  in  reverse 4 points 2 points 0 points 0 points 0 points  Repeat phrase - - 0 points 0 points 2 points  Total Score - - 0 0 2    Immunizations Immunization History  Administered Date(s) Administered   Fluad Quad(high Dose 65+) 10/17/2019   Influenza, High Dose Seasonal PF 10/16/2015, 10/31/2017   Influenza-Unspecified 09/12/2014, 09/27/2016, 11/08/2018   PFIZER Comirnaty(Gray Top)Covid-19 Tri-Sucrose Vaccine 01/17/2019, 02/14/2019, 10/18/2019   PFIZER(Purple Top)SARS-COV-2 Vaccination 01/17/2019, 02/14/2019, 10/18/2019, 05/28/2020   Pneumococcal Conjugate-13 01/17/2015   Pneumococcal Polysaccharide-23 01/17/2011   Td 12/11/2015   Tdap 12/11/2015, 12/11/2015   Zoster Recombinat (Shingrix) 07/16/2020, 11/17/2020   Zoster, Live 07/15/2012   Screening Tests Health Maintenance  Topic Date Due   INFLUENZA VACCINE  04/10/2021 (Originally 08/11/2020)   TETANUS/TDAP  12/10/2025   Pneumonia Vaccine 50+ Years old  Completed   DEXA SCAN  Completed   COVID-19 Vaccine  Completed   Zoster Vaccines- Shingrix  Completed   HPV VACCINES  Aged Out   Health Maintenance There are no preventive care reminders to display for this patient.  Lung Cancer Screening: (Low Dose CT Chest recommended if Age 39-80 years, 30 pack-year currently smoking OR have quit w/in 15years.) does not qualify.   Hepatitis C Screening: does not qualify  Vision Screening: Recommended annual ophthalmology exams for early detection of glaucoma and other disorders of the eye.  Dental Screening: Recommended annual dental exams for proper oral hygiene  Community Resource Referral / Chronic Care Management: CRR required this visit?  No   CCM required this visit?  No      Plan:   Keep all routine maintenance appointments.   I have personally reviewed and noted the following in the patients chart:   Medical and social history Use of alcohol, tobacco or illicit drugs  Current medications and supplements including opioid  prescriptions.  Functional ability and status Nutritional status Physical activity Advanced directives List of other physicians Hospitalizations, surgeries, and ER visits in previous 12 months Vitals Screenings to include cognitive, depression, and falls Referrals and appointments  In addition, I have reviewed and discussed with patient certain preventive protocols, quality metrics, and best practice recommendations. A written personalized care plan for preventive services as well as general preventive health recommendations were provided to patient.     OBrien-Blaney, Arshan Jabs L, LPN   89/37/[id number]    I have reviewed the above information and agree with above.   Deborra Medina, MD

## 2020-12-29 ENCOUNTER — Telehealth: Payer: Self-pay | Admitting: Internal Medicine

## 2020-12-29 MED ORDER — MOLNUPIRAVIR EUA 200MG CAPSULE: 4.0000 | ORAL_CAPSULE | Freq: Two times a day (BID) | ORAL | 0 refills | Status: AC

## 2020-12-29 NOTE — Addendum Note (Signed)
Addended by: Crecencio Mc on: 12/29/2020 03:59 PM   Modules accepted: Orders

## 2020-12-29 NOTE — Telephone Encounter (Signed)
Spoke with pt and she stated that she woke up this morning feeling like she "had a really bad cold". Pt stated that she has developed a cough and runny nose. Pt went down to see the nurse at [facility] and they tested her for covid and she tested positive. They advised pt to call PCP and see about getting the antiviral. Pt does not have a fever, no trouble breathing, no body aches or chills.

## 2020-12-29 NOTE — Telephone Encounter (Signed)
Patient lives at the [facility]. She has tested positive for COVID and would like the antiviral medication.

## 2020-12-29 NOTE — Telephone Encounter (Signed)
Spoke with pt and informed her that Dr. Derrel Nip did send in the antiviral medication. Pt gave a verbal understanding.

## 2020-12-29 NOTE — Telephone Encounter (Signed)
Molnupiravir  has been set to [facility]

## 2021-01-09 DIAGNOSIS — H401121 Primary open-angle glaucoma, left eye, mild stage: Secondary | ICD-10-CM | POA: Diagnosis not present

## 2021-01-16 ENCOUNTER — Ambulatory Visit (INDEPENDENT_AMBULATORY_CARE_PROVIDER_SITE_OTHER): Payer: PPO | Admitting: Internal Medicine

## 2021-01-16 ENCOUNTER — Other Ambulatory Visit: Payer: Self-pay

## 2021-01-16 ENCOUNTER — Encounter: Payer: Self-pay | Admitting: Internal Medicine

## 2021-01-16 ENCOUNTER — Telehealth: Payer: Self-pay | Admitting: Internal Medicine

## 2021-01-16 VITALS — BP 168/82 | HR 71 | Temp 98.0°F | Ht 59.0 in | Wt 111.4 lb

## 2021-01-16 DIAGNOSIS — G609 Hereditary and idiopathic neuropathy, unspecified: Secondary | ICD-10-CM

## 2021-01-16 DIAGNOSIS — E039 Hypothyroidism, unspecified: Secondary | ICD-10-CM | POA: Diagnosis not present

## 2021-01-16 DIAGNOSIS — M810 Age-related osteoporosis without current pathological fracture: Secondary | ICD-10-CM

## 2021-01-16 DIAGNOSIS — E559 Vitamin D deficiency, unspecified: Secondary | ICD-10-CM

## 2021-01-16 DIAGNOSIS — I10 Essential (primary) hypertension: Secondary | ICD-10-CM | POA: Diagnosis not present

## 2021-01-16 DIAGNOSIS — H353230 Exudative age-related macular degeneration, bilateral, stage unspecified: Secondary | ICD-10-CM

## 2021-01-16 DIAGNOSIS — R7303 Prediabetes: Secondary | ICD-10-CM | POA: Diagnosis not present

## 2021-01-16 LAB — COMPREHENSIVE METABOLIC PANEL
ALT: 12 U/L (ref 0–35)
AST: 18 U/L (ref 0–37)
Albumin: 4 g/dL (ref 3.5–5.2)
Alkaline Phosphatase: 58 U/L (ref 39–117)
BUN: 13 mg/dL (ref 6–23)
CO2: 28 mEq/L (ref 19–32)
Calcium: 9.2 mg/dL (ref 8.4–10.5)
Chloride: 104 mEq/L (ref 96–112)
Creatinine, Ser: 0.81 mg/dL (ref 0.40–1.20)
GFR: 63.31 mL/min (ref >60.00)
Glucose, Bld: 98 mg/dL (ref 70–99)
Potassium: 4.2 mEq/L (ref 3.5–5.1)
Sodium: 143 mEq/L (ref 135–145)
Total Bilirubin: 0.7 mg/dL (ref 0.2–1.2)
Total Protein: 6.6 g/dL (ref 6.0–8.3)

## 2021-01-16 LAB — HEMOGLOBIN A1C: Hgb A1c MFr Bld: 6.1 % (ref 4.6–6.5)

## 2021-01-16 LAB — MICROALBUMIN / CREATININE URINE RATIO
Creatinine,U: 63.6 mg/dL
Microalb Creat Ratio: 7.7 mg/g (ref 0.0–30.0)
Microalb, Ur: 4.9 mg/dL — ABNORMAL HIGH (ref 0.0–1.9)

## 2021-01-16 LAB — VITAMIN D 25 HYDROXY (VIT D DEFICIENCY, FRACTURES): VITD: 33.27 ng/mL (ref 30.00–100.00)

## 2021-01-16 LAB — TSH: TSH: 1.77 u[IU]/mL (ref 0.35–5.50)

## 2021-01-16 MED ORDER — AMLODIPINE BESYLATE 5 MG PO TABS: 5.0000 mg | ORAL_TABLET | Freq: Every day | ORAL | 1 refills | Status: DC

## 2021-01-16 MED ORDER — AMLODIPINE BESYLATE 5 MG PO TABS: ORAL_TABLET | ORAL | 1 refills | Status: DC

## 2021-01-16 NOTE — Telephone Encounter (Signed)
Molly from [facility] called in stating that there is no directions for medication (amLODipine (NORVASC) 5 MG tablet). Molly requesting for callback at [phone]

## 2021-01-16 NOTE — Progress Notes (Signed)
Subjective:  Patient ID: Gloria Carr, female    DOB: [birth year]  Age: 88 y.o. MRN: [id number]  CC: The primary encounter diagnosis was Essential hypertension. 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  HPI Gloria Carr presents for  Chief Complaint  Patient presents with   Follow-up    6 month follow up on hypertension, hypothyroidism    Lots of issues  making life miserable  1) bilateral macular degeneration:  has to have large print.  Has glaucoma as well.  Using eye drops latanoprost and systane    2) peripheral neuropathy:  seeing neurology, taking gabapentin   200 mg tid per Dr Manuella Ghazi.  Denies pain,  just feels like she is walking on squishy sponges.  Finger tips also feel numb   3) HTN:  taking 2.5 amlodipine once daily .  Does not measure BP at home.   4) OAB:  voiding once per night WITHOUT trospium which she stopped taking after a 30 day trial .   PVR was normal at 99 ml per last Urology follow up in November.   Does not want more meds .  5) Constipation:  moves daily,  stools are hard initially,  has several BM's which get progressively more loose,  often done by 10 am   6) uses a walker with ambulation.  No falls but occasionally trips on the padded kitchen sink runner .  Attending a balance class two  times per week and walks to get her mail which is 0.25 mile (lives at [place]) .  [place] during good weather  7)   Outpatient Medications Prior to Visit  Medication Sig Dispense Refill   Boswellia-Glucosamine-Vit D (OSTEO BI-FLEX ONE PER DAY PO) Take 1 capsule by mouth daily.     Cholecalciferol (VITAMIN D3) 50 MCG (2000 UT) capsule Take 2,000 Units by mouth daily.     denosumab (PROLIA) 60 MG/ML SOSY  injection Inject 60 mg into the skin every 6 (six) months.     fluticasone (FLONASE) 50 MCG/ACT nasal spray Place into both nostrils daily. Reported on 01/17/2015     gabapentin (NEURONTIN) 100 MG capsule Take 1 capsule by mouth with breakfast, with lunch, and with evening meal.     latanoprost (XALATAN) 0.005 % ophthalmic solution INSTILL 1 DROP IN THE LEFT EYE AT NIGHT  5   levothyroxine (SYNTHROID) 50 MCG tablet TAKE 1 TABLET EVERY DAY ON EMPTY STOMACHWITH A GLASS OF WATER AT LEAST 30-60 MINBEFORE BREAKFAST 90 tablet 0   Misc Natural Products (OSTEO BI-FLEX JOINT SHIELD PO) Take by mouth.     Multiple Vitamins-Minerals (CENTRUM SILVER PO) Take by mouth.     Multiple Vitamins-Minerals (PRESERVISION AREDS 2 PO) Take by mouth.     pravastatin (PRAVACHOL) 20 MG tablet TAKE 1 TABLET BY MOUTH DAILY 90 tablet 1   Propylene Glycol (SYSTANE BALANCE OP) Apply to eye.     amLODipine (NORVASC) 2.5 MG tablet TAKE 1 TABLET BY MOUTH DAILY AT NIGHT FOR BLOOD PRESSURE 90 tablet 0   Trospium Chloride 60 MG CP24 Take 1 capsule (60 mg total) by mouth daily. (Patient not taking: Reported on 01/16/2021) 30 capsule 11   No facility-administered medications prior to visit.    Review of Systems;  Patient denies headache, fevers, malaise, unintentional weight loss, skin rash, eye pain, sinus congestion  and sinus pain, sore throat, dysphagia,  hemoptysis , cough, dyspnea, wheezing, chest pain, palpitations, orthopnea, edema, abdominal pain, nausea, melena, diarrhea, constipation, flank pain, dysuria, hematuria, urinary  Frequency, nocturia, numbness, tingling, seizures,  Focal weakness, Loss of consciousness,  Tremor, insomnia, depression, anxiety, and suicidal ideation.      Objective:  BP (!) 168/82 (BP Location: Left Arm, Patient Position: Sitting, Cuff Size: Normal)    Pulse 71    Temp 98 F (36.7 C) (Oral)    Ht 4\' 11"  (1.499 m)    Wt 111 lb 6.4 oz (50.5 kg)    LMP  (LMP Unknown)    SpO2 95%    BMI 22.50 kg/m    BP Readings from Last 3 Encounters:  01/16/21 (!) 168/82  11/12/20 (!) 142/92  07/16/20 130/62    Wt Readings from Last 3 Encounters:  01/16/21 111 lb 6.4 oz (50.5 kg)  12/26/20 100 lb (45.4 kg)  11/12/20 100 lb (45.4 kg)    General appearance: alert, cooperative and appears stated age Ears: normal TM's and external ear canals both ears Throat: lips, mucosa, and tongue normal; teeth and gums normal Neck: no adenopathy, no carotid bruit, supple, symmetrical, trachea midline and thyroid not enlarged, symmetric, no tenderness/mass/nodules Back: symmetric, no curvature. ROM normal. No CVA tenderness. Lungs: clear to auscultation bilaterally Heart: regular rate and rhythm, S1, S2 normal, no murmur, click, rub or gallop Abdomen: soft, non-tender; bowel sounds normal; no masses,  no organomegaly Pulses: 2+ and symmetric Skin: Skin color, texture, turgor normal. No rashes or lesions Lymph nodes: Cervical, supraclavicular, and axillary nodes normal.  Lab Results  Component Value Date   HGBA1C 6.1 01/16/2021   HGBA1C 6.0 07/16/2020   HGBA1C 5.7 04/10/2019    Lab Results  Component Value Date   CREATININE 0.81 01/16/2021   CREATININE 0.68 07/16/2020   CREATININE 0.65 04/10/2019    Lab Results  Component Value Date   WBC 7.4 10/09/2018   HGB 13.6 10/09/2018   HCT 41.6 10/09/2018   PLT 391.0 10/09/2018   GLUCOSE 98 01/16/2021   CHOL 186 07/16/2020   TRIG 105.0 07/16/2020   HDL 78.50 07/16/2020   LDLCALC 87 07/16/2020   ALT 12 01/16/2021   AST 18 01/16/2021   NA 143 01/16/2021   K 4.2 01/16/2021   CL 104 01/16/2021   CREATININE 0.81 01/16/2021   BUN 13 01/16/2021   CO2 28 01/16/2021   TSH 1.77 01/16/2021   HGBA1C 6.1 01/16/2021   MICROALBUR 4.9 (H) 01/16/2021    DG Bone Density  Result Date: 10/21/2020 EXAM: DUAL X-RAY ABSORPTIOMETRY (DXA) FOR BONE MINERAL DENSITY IMPRESSION: Your patient Gloria Carr completed a BMD test on 10/21/2020 using the [employer] (software version: 14.10) manufactured by [employer]. The following summarizes the results of our evaluation. Technologist: SCE PATIENT BIOGRAPHICAL: Name: Gloria Carr Patient ID: [id number] Birth Date: 11-11-1927 Height: 59.0 in. Gender: Female Exam Date: 10/21/2020 Weight: 110.8 lbs. Indications: Advanced Age, Height Loss, Hx of tobacco use, Hypothyroid, Osteoporotic, Postmenopausal, Vitamin D Deficiency Fractures: Treatments: Levothyroxine, Multi-Vitamin, Prolia, Vitamin D DENSITOMETRY RESULTS: Site         Region      Measured Date Measured Age WHO Classification Young Adult T-score BMD         %Change vs. Previous Significant Change (*) DualFemur Total Right 10/21/2020 91.5 Osteoporosis -2.5 0.692 g/cm2 5.5% Yes DualFemur Total Right 12/22/2015 86.6 Osteoporosis -2.8 0.656 g/cm2 - - DualFemur Total Mean 10/21/2020 91.5 Osteopenia -  2.4 0.709 g/cm2 3.8% Yes DualFemur Total Mean 12/22/2015 86.6 Osteoporosis -2.6 0.683 g/cm2 - - Left Forearm Radius 33% 10/21/2020 91.5 Osteoporosis -3.9 0.532 g/cm2 -6.2% - Left Forearm Radius 33% 12/22/2015 86.6 Osteoporosis -3.5 0.567 g/cm2 - - ASSESSMENT: The BMD measured at Forearm Radius 33% is 0.532 g/cm2 with a T-score of -3.9. This patient is considered osteoporotic according to [facility]) criteria. The scan quality is good. Lumbar spine was not utilized due to advanced degenerative changes/scoliosis. Compared with prior study, there has been a significant increase in the total hip. World Pharmacologist [facility]) criteria for post-menopausal, Caucasian Women: Normal:                   T-score at or above -1 SD Osteopenia/low bone mass: T-score between -1 and -2.5 SD Osteoporosis:             T-score at or below -2.5 SD RECOMMENDATIONS: 1. All patients should optimize calcium and vitamin D intake. 2. Consider FDA-approved medical therapies in postmenopausal women and men aged 71 years and older, based on the following: a. A hip or  vertebral(clinical or morphometric) fracture b. T-score < -2.5 at the femoral neck or spine after appropriate evaluation to exclude secondary causes c. Low bone mass (T-score between -1.0 and -2.5 at the femoral neck or spine) and a 10-year probability of a hip fracture > 3% or a 10-year probability of a major osteoporosis-related fracture > 20% based on the [place]-adapted WHO algorithm 3. Clinician judgment and/or patient preferences may indicate treatment for people with 10-year fracture probabilities above or below these levels FOLLOW-UP: People with diagnosed cases of osteoporosis or at high risk for fracture should have regular bone mineral density tests. For patients eligible for Medicare, routine testing is allowed once every 2 years. The testing frequency can be increased to one year for patients who have rapidly progressing disease, those who are receiving or discontinuing medical therapy to restore bone mass, or have additional risk factors. I have reviewed this report, and agree with the above findings. [facility] Electronically Signed   By: Rolm Baptise M.D.   On: 10/21/2020 15:50    Assessment & Plan:   Problem List Items Addressed This Visit     Acquired hypothyroidism    Thyroid function is WNL on current dose of 50 mcg levothyroxine daily .  No current changes needed.   Lab Results  Component Value Date   TSH 1.77 01/16/2021         Relevant Orders   TSH (Completed)   Hereditary and idiopathic peripheral neuropathy    She denies pain , endorses a feeling of "sponginess" to feet and numbness to finger tips.  She had  no appreciable improvement in symptoms with cymbalta and has discontinued it.  Continue gabapentin prn neurology       Osteoporosis    With history of pelvic fractures. Continue Prolia every 6 months.  Reviewed calcium and vitamin D supplemementation. Reviewed  Last DEXA in Oct 2022 which  noted improvement I n BMD       Vitamin D deficiency    Relevant Orders   VITAMIN D 25 Hydroxy (Vit-D Deficiency, Fractures) (Completed)   Essential hypertension - Primary    Not at goal on minimal dose of amlodipine.  She has minimal early microalbuminuria.  Will increase dose to 5 mg       Relevant Orders   Comprehensive metabolic panel (Completed)   Urine Microalbumin w/creat. ratio (  Completed)   Prediabetes    Her  Fasting glucose is not  elevated but her A1c is 6.1  I recommend he follow a low glycemic index diet and particpate regularly in an aerobic  exercise activity.  We should check an A1c in 6 months.        Relevant Orders   HgB A1c (Completed)   Urine Microalbumin w/creat. 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  Requires large print.     Lives at [place]       I have discontinued Elnita Hackman's Trospium Chloride and amLODipine. I am also having her maintain her Multiple Vitamins-Minerals (PRESERVISION AREDS 2 PO), Multiple Vitamins-Minerals (CENTRUM SILVER PO), Misc Natural Products (OSTEO BI-FLEX JOINT SHIELD PO), fluticasone, latanoprost, denosumab, gabapentin, Vitamin D3, pravastatin, levothyroxine, Propylene Glycol (SYSTANE BALANCE OP), and Boswellia-Glucosamine-Vit D (OSTEO BI-FLEX ONE PER DAY PO).  Meds ordered this encounter  Medications   DISCONTD: amLODipine (NORVASC) 5 MG tablet    Sig: KEEP ON FILE FOR FUTURE REFILLS.  NOTE DOSE INCREASE.  WILL NEED RX IN ONE MONTH    Dispense:  90 tablet    Refill:  1     I provided  30 minutes of  face-to-face time during this encounter reviewing patient's current problems and past surgeries, labs and imaging studies, providing counseling on the above mentioned problems , and coordination  of care .   Follow-up: Return in about 6 months (around 07/16/2021).   Crecencio Mc, MD

## 2021-01-16 NOTE — Patient Instructions (Addendum)
For your bowels,  Try taking miralax or benefiber once daily  in the morning  with a full 8 ounce glass of water   DO NOT TAKE WITH OTHER MEDICATIONS   FOR YOUR BLOOD PRESSURE:  PLEASE INCREASE your amlodipine dose to 5 mg daily.  This is your blood pressure medication.    PLEASE HAVE THE [facility] NURSE CHECK YOUR BLOOD PRESSURE IN 2 WEEKS TO SEE IF THE CHANGE IN AMLODIPINE DOSE IS SUFFICIENT

## 2021-01-16 NOTE — Telephone Encounter (Signed)
Medication has been resent with directions of use and pharmacist was made aware.

## 2021-01-16 NOTE — Assessment & Plan Note (Addendum)
Under treatment.  Requires large print.     Lives at [facility]

## 2021-01-17 ENCOUNTER — Encounter: Payer: Self-pay | Admitting: Internal Medicine

## 2021-01-17 NOTE — Assessment & Plan Note (Signed)
Thyroid function is WNL on current dose of 50 mcg levothyroxine daily .  No current changes needed.   Lab Results  Component Value Date   TSH 1.77 01/16/2021

## 2021-01-17 NOTE — Assessment & Plan Note (Signed)
She denies pain , endorses a feeling of "sponginess" to feet and numbness to finger tips.  She had  no appreciable improvement in symptoms with cymbalta and has discontinued it.  Continue gabapentin prn neurology

## 2021-01-17 NOTE — Assessment & Plan Note (Signed)
Her  Fasting glucose is not  elevated but her A1c is 6.1  I recommend he follow a low glycemic index diet and particpate regularly in an aerobic  exercise activity.  We should check an A1c in 6 months.

## 2021-01-17 NOTE — Assessment & Plan Note (Addendum)
Not at goal on minimal dose of amlodipine.  She has minimal early microalbuminuria.  Will increase dose to 5 mg

## 2021-01-17 NOTE — Assessment & Plan Note (Addendum)
With history of pelvic fractures. Continue Prolia every 6 months.  Reviewed calcium and vitamin D supplemementation. Reviewed  Last DEXA in Oct 2022 which  noted improvement I n BMD

## 2021-01-28 ENCOUNTER — Telehealth: Payer: Self-pay | Admitting: Internal Medicine

## 2021-01-28 NOTE — Telephone Encounter (Signed)
Patient called in stated her blood pressure 125/57 and patient stated she is taking amlodipine but no longer taking one pill but taking two. Patient stated her blood pressure is good and wanted to check in to speak with Dr Derrel Nip nurse  before she change medication back to one pill, patient states please call her @ [phone]  if Dr Derrel Nip want patient to go back to one pill. Patient stated she  will continue taking two pills until patient hear differently, Patient decline access nurse

## 2021-01-29 NOTE — Telephone Encounter (Signed)
Spoke with pt and explained to her that because her blood pressure is better taking two tablets(5 mg total) that she should not drop back to taking one pill again(2.5 mg) because most likely her bp will go back up. Pt was advised to continue with the two tablets(5 mg total) unless her bp starts to drop to low and then she should give the office a call. Pt gave a verbal understanding.

## 2021-02-13 ENCOUNTER — Ambulatory Visit: Payer: PPO | Admitting: Physician Assistant

## 2021-03-10 DIAGNOSIS — B078 Other viral warts: Secondary | ICD-10-CM | POA: Diagnosis not present

## 2021-03-10 DIAGNOSIS — L821 Other seborrheic keratosis: Secondary | ICD-10-CM | POA: Diagnosis not present

## 2021-03-10 DIAGNOSIS — D485 Neoplasm of uncertain behavior of skin: Secondary | ICD-10-CM | POA: Diagnosis not present

## 2021-03-10 DIAGNOSIS — Z08 Encounter for follow-up examination after completed treatment for malignant neoplasm: Secondary | ICD-10-CM | POA: Diagnosis not present

## 2021-03-10 DIAGNOSIS — Z85828 Personal history of other malignant neoplasm of skin: Secondary | ICD-10-CM | POA: Diagnosis not present

## 2021-03-10 DIAGNOSIS — L538 Other specified erythematous conditions: Secondary | ICD-10-CM | POA: Diagnosis not present

## 2021-03-10 DIAGNOSIS — R238 Other skin changes: Secondary | ICD-10-CM | POA: Diagnosis not present

## 2021-03-10 DIAGNOSIS — D229 Melanocytic nevi, unspecified: Secondary | ICD-10-CM | POA: Diagnosis not present

## 2021-03-11 ENCOUNTER — Telehealth: Payer: Self-pay | Admitting: Internal Medicine

## 2021-03-20 ENCOUNTER — Other Ambulatory Visit: Payer: Self-pay | Admitting: Internal Medicine

## 2021-04-10 NOTE — Telephone Encounter (Signed)
Received written determination from Insurance verifying no prior authorization needed for Prolia. ?

## 2021-04-10 NOTE — Telephone Encounter (Signed)
Left Message for patient to return call to office, please advise Prolia approved and schedule nurse visit on or after 04/13/21 ?

## 2021-04-23 ENCOUNTER — Telehealth: Payer: Self-pay

## 2021-04-23 ENCOUNTER — Ambulatory Visit: Payer: PPO

## 2021-04-23 ENCOUNTER — Ambulatory Visit (INDEPENDENT_AMBULATORY_CARE_PROVIDER_SITE_OTHER): Payer: PPO

## 2021-04-23 DIAGNOSIS — M810 Age-related osteoporosis without current pathological fracture: Secondary | ICD-10-CM | POA: Diagnosis not present

## 2021-04-23 MED ORDER — DENOSUMAB 60 MG/ML ~~LOC~~ SOSY
60.0000 mg | PREFILLED_SYRINGE | Freq: Once | SUBCUTANEOUS | Status: AC
  Administered 2021-04-23: 60 mg via SUBCUTANEOUS

## 2021-04-23 NOTE — Progress Notes (Signed)
Gloria Carr presents today for injection per MD orders. ?Prolia injection  administered SQ in right Upper Arm. ?Administration without incident. ?Patient tolerated well.  Gloria Carr,cma  ? ?

## 2021-04-24 ENCOUNTER — Telehealth: Payer: Self-pay

## 2021-04-24 NOTE — Telephone Encounter (Signed)
LM letting pt's son know that the handicap placard form is complete and ready for pick up. Form is up front in folder.  ?

## 2021-05-01 ENCOUNTER — Other Ambulatory Visit: Payer: Self-pay | Admitting: Internal Medicine

## 2021-05-11 NOTE — Telephone Encounter (Signed)
Prolia given

## 2021-05-11 NOTE — Telephone Encounter (Signed)
Pt returning call... Confirm prolia was given on 04/23/2021... Pt stated that she did get her prolia injection.Gloria KitchenMarland Carr   ?

## 2021-06-19 ENCOUNTER — Other Ambulatory Visit: Payer: Self-pay | Admitting: Family

## 2021-06-24 NOTE — Telephone Encounter (Signed)
Patient called and said she has  been trying to get her levothyroxine (SYNTHROID) 50 MCG tablet refilled since Friday.

## 2021-06-24 NOTE — Telephone Encounter (Signed)
Pt called about update on medication. Pt would like to be called when its ready

## 2021-06-25 ENCOUNTER — Telehealth: Payer: Self-pay

## 2021-06-25 DIAGNOSIS — I10 Essential (primary) hypertension: Secondary | ICD-10-CM

## 2021-06-25 DIAGNOSIS — E039 Hypothyroidism, unspecified: Secondary | ICD-10-CM

## 2021-06-25 DIAGNOSIS — R5383 Other fatigue: Secondary | ICD-10-CM

## 2021-06-25 DIAGNOSIS — E7800 Pure hypercholesterolemia, unspecified: Secondary | ICD-10-CM

## 2021-06-25 DIAGNOSIS — R7303 Prediabetes: Secondary | ICD-10-CM

## 2021-06-25 MED ORDER — LEVOTHYROXINE SODIUM 50 MCG PO TABS: ORAL_TABLET | ORAL | 0 refills | Status: DC

## 2021-06-25 NOTE — Telephone Encounter (Signed)
6 month lab orders have been placed for lab appt.

## 2021-06-29 ENCOUNTER — Other Ambulatory Visit (INDEPENDENT_AMBULATORY_CARE_PROVIDER_SITE_OTHER): Payer: PPO

## 2021-06-29 DIAGNOSIS — G609 Hereditary and idiopathic neuropathy, unspecified: Secondary | ICD-10-CM | POA: Diagnosis not present

## 2021-06-29 DIAGNOSIS — R7303 Prediabetes: Secondary | ICD-10-CM

## 2021-06-29 DIAGNOSIS — I10 Essential (primary) hypertension: Secondary | ICD-10-CM

## 2021-06-29 DIAGNOSIS — E039 Hypothyroidism, unspecified: Secondary | ICD-10-CM

## 2021-06-29 DIAGNOSIS — M5481 Occipital neuralgia: Secondary | ICD-10-CM | POA: Diagnosis not present

## 2021-06-29 DIAGNOSIS — R5383 Other fatigue: Secondary | ICD-10-CM

## 2021-06-29 DIAGNOSIS — N3281 Overactive bladder: Secondary | ICD-10-CM | POA: Diagnosis not present

## 2021-06-29 DIAGNOSIS — E7800 Pure hypercholesterolemia, unspecified: Secondary | ICD-10-CM

## 2021-06-29 LAB — CBC WITH DIFFERENTIAL/PLATELET
Basophils Absolute: 0 10*3/uL (ref 0.0–0.1)
Basophils Relative: 0.6 % (ref 0.0–3.0)
Eosinophils Absolute: 0.1 10*3/uL (ref 0.0–0.7)
Eosinophils Relative: 1.1 % (ref 0.0–5.0)
HCT: 44.5 % (ref 36.0–46.0)
Hemoglobin: 14.5 g/dL (ref 12.0–15.0)
Lymphocytes Relative: 25.6 % (ref 12.0–46.0)
Lymphs Abs: 1.9 10*3/uL (ref 0.7–4.0)
MCHC: 32.6 g/dL (ref 30.0–36.0)
MCV: 92.3 fl (ref 78.0–100.0)
Monocytes Absolute: 0.6 10*3/uL (ref 0.1–1.0)
Monocytes Relative: 8.2 % (ref 3.0–12.0)
Neutro Abs: 4.7 10*3/uL (ref 1.4–7.7)
Neutrophils Relative %: 64.5 % (ref 43.0–77.0)
Platelets: 257 10*3/uL (ref 150.0–400.0)
RBC: 4.82 Mil/uL (ref 3.87–5.11)
RDW: 13.3 % (ref 11.5–15.5)
WBC: 7.3 10*3/uL (ref 4.0–10.5)

## 2021-06-29 LAB — COMPREHENSIVE METABOLIC PANEL
ALT: 14 U/L (ref 0–35)
AST: 18 U/L (ref 0–37)
Albumin: 4 g/dL (ref 3.5–5.2)
Alkaline Phosphatase: 56 U/L (ref 39–117)
BUN: 17 mg/dL (ref 6–23)
CO2: 31 mEq/L (ref 19–32)
Calcium: 9.4 mg/dL (ref 8.4–10.5)
Chloride: 105 mEq/L (ref 96–112)
Creatinine, Ser: 0.78 mg/dL (ref 0.40–1.20)
GFR: 66.03 mL/min (ref >60.00)
Glucose, Bld: 93 mg/dL (ref 70–99)
Potassium: 4.2 mEq/L (ref 3.5–5.1)
Sodium: 143 mEq/L (ref 135–145)
Total Bilirubin: 0.9 mg/dL (ref 0.2–1.2)
Total Protein: 6.6 g/dL (ref 6.0–8.3)

## 2021-06-29 LAB — LIPID PANEL
Cholesterol: 180 mg/dL (ref 0–200)
HDL: 89 mg/dL (ref >39.00)
LDL Cholesterol: 77 mg/dL (ref 0–99)
NonHDL: 90.85
Total CHOL/HDL Ratio: 2
Triglycerides: 71 mg/dL (ref 0.0–149.0)
VLDL: 14.2 mg/dL (ref 0.0–40.0)

## 2021-06-29 LAB — TSH: TSH: 2.51 u[IU]/mL (ref 0.35–5.50)

## 2021-06-29 LAB — HEMOGLOBIN A1C: Hgb A1c MFr Bld: 5.9 % (ref 4.6–6.5)

## 2021-06-30 ENCOUNTER — Encounter: Payer: Self-pay | Admitting: Internal Medicine

## 2021-07-01 ENCOUNTER — Ambulatory Visit (INDEPENDENT_AMBULATORY_CARE_PROVIDER_SITE_OTHER): Payer: PPO | Admitting: Internal Medicine

## 2021-07-01 DIAGNOSIS — H6121 Impacted cerumen, right ear: Secondary | ICD-10-CM | POA: Diagnosis not present

## 2021-07-01 DIAGNOSIS — I10 Essential (primary) hypertension: Secondary | ICD-10-CM

## 2021-07-01 DIAGNOSIS — E039 Hypothyroidism, unspecified: Secondary | ICD-10-CM | POA: Diagnosis not present

## 2021-07-01 DIAGNOSIS — G609 Hereditary and idiopathic neuropathy, unspecified: Secondary | ICD-10-CM

## 2021-07-01 NOTE — Progress Notes (Signed)
Subjective:  Patient ID: Gloria Carr, female    DOB: October 28, 1927  Age: 88 y.o. MRN: [id number]  CC: Diagnoses of Hearing loss of right ear due to cerumen impaction, Acquired hypothyroidism, Hereditary and idiopathic peripheral neuropathy, and Essential hypertension were pertinent to this visit.   HPI Gloria Carr presents for follow up on multiple issues  Chief Complaint  Patient presents with   Follow-up    6 month follow up    1) peripheral neuropathy:  she continues to report feelings of her "feet feeling  squishy"  NEUROLOGY has been weaning gabapentin  2) venous insufficiency bilateral,   3) reviewed medications .  Doesn't like being on so many  .  Reviewed her medications,  noting that she is only taking 4 medications   4) right cerumen impaction with loss of hearing   5) recent fall using cane.   Her 3rd fall since moving to [place].    Now using a walker having  eye exams annually .  Doesn't like her glasses but refuses to have bifocal lenses  which compromises  the lense.     Outpatient Medications Prior to Visit  Medication Sig Dispense Refill   amLODipine (NORVASC) 5 MG tablet Take 1 tablet (5 mg total) by mouth daily. NOTE DOSE INCREASE. 90 tablet 1   Boswellia-Glucosamine-Vit D (OSTEO BI-FLEX ONE PER DAY PO) Take 1 capsule by mouth daily.     Cholecalciferol (VITAMIN D3) 50 MCG (2000 UT) capsule Take 2,000 Units by mouth daily.     denosumab (PROLIA) 60 MG/ML SOSY injection Inject 60 mg into the skin every 6 (six) months.     diclofenac Sodium (VOLTAREN) 1 % GEL Apply 2 g topically 4 (four) times daily.     fluticasone (FLONASE) 50 MCG/ACT nasal spray Place into both nostrils daily. Reported on 01/17/2015     gabapentin (NEURONTIN) 100 MG capsule Take 1 capsule by mouth with breakfast, with lunch, and with evening meal.     latanoprost (XALATAN) 0.005 % ophthalmic solution INSTILL 1 DROP IN THE LEFT EYE AT NIGHT  5   levothyroxine (SYNTHROID) 50 MCG tablet TAKE 1  TABLET EVERY DAY ON EMPTY STOMACHWITH A GLASS OF WATER AT LEAST 30-60 MINBEFORE BREAKFAST 90 tablet 0   Misc Natural Products (OSTEO BI-FLEX JOINT SHIELD PO) Take by mouth.     Multiple Vitamins-Minerals (CENTRUM SILVER PO) Take by mouth.     Multiple Vitamins-Minerals (PRESERVISION AREDS 2 PO) Take by mouth.     pravastatin (PRAVACHOL) 20 MG tablet TAKE 1 TABLET BY MOUTH DAILY 90 tablet 3   Propylene Glycol (SYSTANE BALANCE OP) Apply to eye.     No facility-administered medications prior to visit.    Review of Systems;  Patient denies headache, fevers, malaise, unintentional weight loss, skin rash, eye pain, sinus congestion and sinus pain, sore throat, dysphagia,  hemoptysis , cough, dyspnea, wheezing, chest pain, palpitations, orthopnea, edema, abdominal pain, nausea, melena, diarrhea, constipation, flank pain, dysuria, hematuria, urinary  Frequency, nocturia, numbness, tingling, seizures,  Focal weakness, Loss of consciousness,  Tremor, insomnia, depression, anxiety, and suicidal ideation.      Objective:  BP (!) 142/64 (BP Location: Left Arm, Patient Position: Sitting, Cuff Size: Normal)   Pulse (!) 59   Temp 97.9 F (36.6 C) (Oral)   Ht '4\' 11"'$  (1.499 m)   Wt 113 lb 3.2 oz (51.3 kg)   LMP  (LMP Unknown)   SpO2 96%   BMI 22.86 kg/m   BP Readings from  Last 3 Encounters:  07/01/21 (!) 142/64  01/16/21 (!) 168/82  11/12/20 (!) 142/92    Wt Readings from Last 3 Encounters:  07/01/21 113 lb 3.2 oz (51.3 kg)  01/16/21 111 lb 6.4 oz (50.5 kg)  12/26/20 100 lb (45.4 kg)    General appearance: alert, cooperative and appears stated age Ears: right  ear canal occluded by cerumen,  left TM normal  Throat: lips, mucosa, and tongue normal; teeth and gums normal Neck: no adenopathy, no carotid bruit, supple, symmetrical, trachea midline and thyroid not enlarged, symmetric, no tenderness/mass/nodules Back: symmetric, no curvature. ROM normal. No CVA tenderness. Lungs: clear to  auscultation bilaterally Heart: regular rate and rhythm, S1, S2 normal, no murmur, click, rub or gallop Abdomen: soft, non-tender; bowel sounds normal; no masses,  no organomegaly Pulses: 2+ and symmetric Skin: Skin color, texture, turgor normal. No rashes or lesions Lymph nodes: Cervical, supraclavicular, and axillary nodes normal. Neuro:  awake and interactive with normal mood and affect. Higher cortical functions are normal. Speech is clear without word-finding difficulty or dysarthria. Extraocular movements are intact. Visual fields of both eyes are grossly intact. Sensation to light touch is grossly intact bilaterally of upper and lower extremities. Motor examination shows 4+/5 symmetric hand grip and upper extremity and 5/5 lower extremity strength. There is no pronation or drift. Gait is non-ataxic   Lab Results  Component Value Date   HGBA1C 5.9 06/29/2021   HGBA1C 6.1 01/16/2021   HGBA1C 6.0 07/16/2020    Lab Results  Component Value Date   CREATININE 0.78 06/29/2021   CREATININE 0.81 01/16/2021   CREATININE 0.68 07/16/2020    Lab Results  Component Value Date   WBC 7.3 06/29/2021   HGB 14.5 06/29/2021   HCT 44.5 06/29/2021   PLT 257.0 06/29/2021   GLUCOSE 93 06/29/2021   CHOL 180 06/29/2021   TRIG 71.0 06/29/2021   HDL 89.00 06/29/2021   LDLCALC 77 06/29/2021   ALT 14 06/29/2021   AST 18 06/29/2021   NA 143 06/29/2021   K 4.2 06/29/2021   CL 105 06/29/2021   CREATININE 0.78 06/29/2021   BUN 17 06/29/2021   CO2 31 06/29/2021   TSH 2.51 06/29/2021   HGBA1C 5.9 06/29/2021   MICROALBUR 4.9 (H) 01/16/2021    DG Bone Density  Result Date: 10/21/2020 EXAM: DUAL X-RAY ABSORPTIOMETRY (DXA) FOR BONE MINERAL DENSITY IMPRESSION: Your patient Gloria Carr completed a BMD test on 10/21/2020 using the [place] (software version: 14.10) manufactured by [employer]. The following summarizes the results of our evaluation. Technologist: SCE PATIENT  BIOGRAPHICAL: Name: Gloria Carr Patient ID: [id number] Birth Date: 03/14/1927 Height: 59.0 in. Gender: Female Exam Date: 10/21/2020 Weight: 110.8 lbs. Indications: Advanced Age, Height Loss, Hx of tobacco use, Hypothyroid, Osteoporotic, Postmenopausal, Vitamin D Deficiency Fractures: Treatments: Levothyroxine, Multi-Vitamin, Prolia, Vitamin D DENSITOMETRY RESULTS: Site         Region      Measured Date Measured Age WHO Classification Young Adult T-score BMD         %Change vs. Previous Significant Change (*) DualFemur Total Right 10/21/2020 91.5 Osteoporosis -2.5 0.692 g/cm2 5.5% Yes DualFemur Total Right 12/22/2015 86.6 Osteoporosis -2.8 0.656 g/cm2 - - DualFemur Total Mean 10/21/2020 91.5 Osteopenia -2.4 0.709 g/cm2 3.8% Yes DualFemur Total Mean 12/22/2015 86.6 Osteoporosis -2.6 0.683 g/cm2 - - Left Forearm Radius 33% 10/21/2020 91.5 Osteoporosis -3.9 0.532 g/cm2 -6.2% - Left Forearm Radius 33% 12/22/2015 86.6 Osteoporosis -3.5 0.567 g/cm2 - - ASSESSMENT: The BMD  measured at Forearm Radius 33% is 0.532 g/cm2 with a T-score of -3.9. This patient is considered osteoporotic according to [facility]) criteria. The scan quality is good. Lumbar spine was not utilized due to advanced degenerative changes/scoliosis. Compared with prior study, there has been a significant increase in the total hip. World Pharmacologist [facility]) criteria for post-menopausal, Caucasian Women: Normal:                   T-score at or above -1 SD Osteopenia/low bone mass: T-score between -1 and -2.5 SD Osteoporosis:             T-score at or below -2.5 SD RECOMMENDATIONS: 1. All patients should optimize calcium and vitamin D intake. 2. Consider FDA-approved medical therapies in postmenopausal women and men aged 85 years and older, based on the following: a. A hip or vertebral(clinical or morphometric) fracture b. T-score < -2.5 at the femoral neck or spine after appropriate evaluation to exclude secondary causes c. Low bone  mass (T-score between -1.0 and -2.5 at the femoral neck or spine) and a 10-year probability of a hip fracture > 3% or a 10-year probability of a major osteoporosis-related fracture > 20% based on the [place]-adapted WHO algorithm 3. Clinician judgment and/or patient preferences may indicate treatment for people with 10-year fracture probabilities above or below these levels FOLLOW-UP: People with diagnosed cases of osteoporosis or at high risk for fracture should have regular bone mineral density tests. For patients eligible for Medicare, routine testing is allowed once every 2 years. The testing frequency can be increased to one year for patients who have rapidly progressing disease, those who are receiving or discontinuing medical therapy to restore bone mass, or have additional risk factors. I have reviewed this report, and agree with the above findings. [facility] Electronically Signed   By: Rolm Baptise M.D.   On: 10/21/2020 15:50    Assessment & Plan:   Problem List Items Addressed This Visit     Acquired hypothyroidism    Thyroid function is WNL on current dose.  No current changes needed.   Lab Results  Component Value Date   TSH 2.51 06/29/2021         Hereditary and idiopathic peripheral neuropathy    She denies pain , endorses a feeling of "sponginess" to feet and numbness to finger tips.  She had  no appreciable improvement in symptoms with cymbalta and has discontinued it.  Continue gabapentin prn neurology       Essential hypertension    Well controlled on current regimen. Renal function stable, no changes today.  Lab Results  Component Value Date   CREATININE 0.78 06/29/2021   Lab Results  Component Value Date   NA 143 06/29/2021   K 4.2 06/29/2021   CL 105 06/29/2021   CO2 31 06/29/2021         Hearing loss of right ear due to cerumen impaction    NEEDS RN VISIT FOR IRRIGATION        I spent a total of 30 minutes with this patient in a face to  face visit on the date of this encounter reviewing the last office visit with me , her most recent neurology  follow up,   patient's diet and eating habits, home blood pressure readings ,  most recent imaging study , and post visit ordering of testing and therapeutics.    Follow-up: Return in about 6 months (around 12/31/2021).   Helene Kelp  Ether Griffins, MD

## 2021-07-01 NOTE — Assessment & Plan Note (Signed)
NEEDS RN VISIT FOR IRRIGATION

## 2021-07-01 NOTE — Patient Instructions (Addendum)
Your are doing well!     Your right ear is PLUGGED With earwax.  I want you to Use Debrox liquid in right ear every night until you return for an ear irrigation by my nurse  3 drops in right ear 15 minutes before bed.  Lie with right ear up for 15 minutes or more,  place a cotton ball in ear to prevent staining of the pillow   Check with [facility] about the pneumonia vaccination.  You may need one more   Once you stop the gabapentin  the swelling in your legs may improve but not completely resolve because you have venous insufficiency.

## 2021-07-01 NOTE — Assessment & Plan Note (Signed)
She denies pain , endorses a feeling of "sponginess" to feet and numbness to finger tips.  She had  no appreciable improvement in symptoms with cymbalta and has discontinued it.  Continue gabapentin prn neurology

## 2021-07-01 NOTE — Assessment & Plan Note (Signed)
Well controlled on current regimen. Renal function stable, no changes today.  Lab Results  Component Value Date   CREATININE 0.78 06/29/2021   Lab Results  Component Value Date   NA 143 06/29/2021   K 4.2 06/29/2021   CL 105 06/29/2021   CO2 31 06/29/2021

## 2021-07-01 NOTE — Assessment & Plan Note (Signed)
Thyroid function is WNL on current dose.  No current changes needed.   Lab Results  Component Value Date   TSH 2.51 06/29/2021    

## 2021-07-08 ENCOUNTER — Ambulatory Visit (INDEPENDENT_AMBULATORY_CARE_PROVIDER_SITE_OTHER): Payer: PPO

## 2021-07-08 ENCOUNTER — Other Ambulatory Visit: Payer: Self-pay | Admitting: Internal Medicine

## 2021-07-08 DIAGNOSIS — H6121 Impacted cerumen, right ear: Secondary | ICD-10-CM | POA: Diagnosis not present

## 2021-07-08 NOTE — Progress Notes (Addendum)
Patient came in today for ear irrigation on right ear that was impacted with wax. Patient has been using Debrox and was was well softened. I was able to irrigate ear with no pain and able to get out a large amount of ear wax from patient's ear. After final wash ear was checked and there was no longer any visible blockages or was seen.   Pt also brought in vaccine record from [facility] [place] and our records were exactly the same as they had as well. Appears in PneumoRecs patient would need PCV 20?   I have reviewed the above information and agree with above.   Recommend patient received PCV 20 this year  Deborra Medina, MD   Pt was LMTCB to schedule her for PCV 20.

## 2021-07-15 ENCOUNTER — Telehealth: Payer: Self-pay

## 2021-07-15 DIAGNOSIS — H353114 Nonexudative age-related macular degeneration, right eye, advanced atrophic with subfoveal involvement: Secondary | ICD-10-CM | POA: Diagnosis not present

## 2021-07-15 DIAGNOSIS — H401121 Primary open-angle glaucoma, left eye, mild stage: Secondary | ICD-10-CM | POA: Diagnosis not present

## 2021-07-15 NOTE — Telephone Encounter (Signed)
LMTCB to schedule patient fotr PCV 20 per Dr. Lupita Dawn request.

## 2021-07-25 NOTE — Telephone Encounter (Signed)
Error. ng 

## 2021-08-31 ENCOUNTER — Ambulatory Visit (INDEPENDENT_AMBULATORY_CARE_PROVIDER_SITE_OTHER): Payer: PPO

## 2021-08-31 ENCOUNTER — Ambulatory Visit (INDEPENDENT_AMBULATORY_CARE_PROVIDER_SITE_OTHER): Payer: PPO | Admitting: Family

## 2021-08-31 ENCOUNTER — Encounter: Payer: Self-pay | Admitting: Family

## 2021-08-31 VITALS — BP 132/82 | HR 63 | Temp 97.7°F | Ht 60.0 in | Wt 112.0 lb

## 2021-08-31 DIAGNOSIS — M160 Bilateral primary osteoarthritis of hip: Secondary | ICD-10-CM | POA: Diagnosis not present

## 2021-08-31 DIAGNOSIS — R140 Abdominal distension (gaseous): Secondary | ICD-10-CM | POA: Diagnosis not present

## 2021-08-31 DIAGNOSIS — M8588 Other specified disorders of bone density and structure, other site: Secondary | ICD-10-CM | POA: Diagnosis not present

## 2021-08-31 DIAGNOSIS — M545 Low back pain, unspecified: Secondary | ICD-10-CM

## 2021-08-31 MED ORDER — TRAMADOL HCL 50 MG PO TABS: 50.0000 mg | ORAL_TABLET | Freq: Every day | ORAL | 1 refills | Status: DC | PRN

## 2021-08-31 NOTE — Progress Notes (Signed)
Subjective:    Patient ID: Gloria Carr, female    DOB: March 06, 1927, 88 y.o.   MRN: [id number]  CC: [name] is a 88 y.o. female who presents today for an acute visit.    HPI: Accompanied by son Bilateral low back pain in the buttocks area, comes and goes for years.  Previously had been on tramadol which had been helpful in the past.  She has tried motrin, aleve with mixed results She is currently taking gabapentin '200mg'$  TID for peripheral neuropathy however hasnt felt effective. She was weaning gabapentin per Dr Manuella Ghazi 06/2021.   Improves with walking; worse with long periods of sitting   Using walker  She had a fall a few weeks ago but doesn't recall details and doesn't think significant as it relates back pain.  No loc.   She also complains of abdominal distention for year, unchanged. She has to purchase larger pant sizes.  Endorses urinary frequency which has been chronic.  Drinks two cups of coffee.  No fever, weight loss, saddle anesthesia, blood in stool, dysuria.  Wearing urinary pad for  'dribbling'   She lives at [place].    No history of cirrhosis No h/o seizure.     H/o left superior pubic fracture  in 2018 History of osteoporosis.  She is compliant with Prolia  Former smoker HISTORY:  Past Medical History:  Diagnosis Date   Allergy    Arthritis    Closed fracture of left superior pubic ramus, with routine healing, subsequent encounter 08/24/2016   Glaucoma    History of chickenpox    History of recurrent UTIs    Hypertension    Macular degeneration    Thyroid disease    Past Surgical History:  Procedure Laterality Date   BILATERAL CARPAL TUNNEL RELEASE  1999-2000   CATARACT EXTRACTION, BILATERAL  2013   Family History  Problem Relation Age of Onset   Stroke Mother    Cancer Mother 58       colon   Cancer Father        bone   Cancer Son        Jaw   Stroke Sister    Breast cancer Neg Hx     Allergies: Sulfa antibiotics Current  Outpatient Medications on File Prior to Visit  Medication Sig Dispense Refill   amLODipine (NORVASC) 5 MG tablet TAKE 1 TABLET BY MOUTH DAILY. DOSE INCREASE. 90 tablet 1   Boswellia-Glucosamine-Vit D (OSTEO BI-FLEX ONE PER DAY PO) Take 1 capsule by mouth daily.     Cholecalciferol (VITAMIN D3) 50 MCG (2000 UT) capsule Take 2,000 Units by mouth daily.     denosumab (PROLIA) 60 MG/ML SOSY injection Inject 60 mg into the skin every 6 (six) months.     diclofenac Sodium (VOLTAREN) 1 % GEL Apply 2 g topically 4 (four) times daily.     fluticasone (FLONASE) 50 MCG/ACT nasal spray Place into both nostrils daily. Reported on 01/17/2015     gabapentin (NEURONTIN) 100 MG capsule Take 1 capsule by mouth with breakfast, with lunch, and with evening meal.     latanoprost (XALATAN) 0.005 % ophthalmic solution INSTILL 1 DROP IN THE LEFT EYE AT NIGHT  5   levothyroxine (SYNTHROID) 50 MCG tablet TAKE 1 TABLET EVERY DAY ON EMPTY STOMACHWITH A GLASS OF WATER AT LEAST 30-60 MINBEFORE BREAKFAST 90 tablet 0   Misc Natural Products (OSTEO BI-FLEX JOINT SHIELD PO) Take by mouth.     Multiple Vitamins-Minerals (CENTRUM SILVER PO)  Take by mouth.     Multiple Vitamins-Minerals (PRESERVISION AREDS 2 PO) Take by mouth.     pravastatin (PRAVACHOL) 20 MG tablet TAKE 1 TABLET BY MOUTH DAILY 90 tablet 3   Propylene Glycol (SYSTANE BALANCE OP) Apply to eye.     No current facility-administered medications on file prior to visit.    Social History   Tobacco Use   Smoking status: Former   Smokeless tobacco: Never  Scientific laboratory technician Use: Never used  Substance Use Topics   Alcohol use: Yes    Alcohol/week: 5.0 standard drinks of alcohol    Types: 5 Standard drinks or equivalent per week    Comment: with dinner   Drug use: No    Review of Systems  Constitutional:  Negative for chills and fever.  Respiratory:  Negative for cough.   Cardiovascular:  Negative for chest pain and palpitations.  Gastrointestinal:   Positive for abdominal distention. Negative for constipation, diarrhea, nausea and vomiting.  Genitourinary:  Negative for dysuria and pelvic pain.  Neurological:  Positive for numbness.      Objective:    BP 132/82 (BP Location: Left Arm, Patient Position: Sitting, Cuff Size: Normal)   Pulse 63   Temp 97.7 F (36.5 C) (Oral)   Ht 5' (1.524 m)   Wt 112 lb (50.8 kg)   LMP  (LMP Unknown)   SpO2 96%   BMI 21.87 kg/m  Wt Readings from Last 3 Encounters:  08/31/21 112 lb (50.8 kg)  07/01/21 113 lb 3.2 oz (51.3 kg)  01/16/21 111 lb 6.4 oz (50.5 kg)     Physical Exam Vitals reviewed.  Constitutional:      Appearance: Normal appearance. She is well-developed.  Eyes:     Conjunctiva/sclera: Conjunctivae normal.  Cardiovascular:     Rate and Rhythm: Normal rate and regular rhythm.     Pulses: Normal pulses.     Heart sounds: Normal heart sounds.  Pulmonary:     Effort: Pulmonary effort is normal.     Breath sounds: Normal breath sounds. No wheezing, rhonchi or rales.  Abdominal:     General: Bowel sounds are normal. There is distension.     Palpations: Abdomen is soft. Abdomen is not rigid. There is no fluid wave or mass.     Tenderness: There is no abdominal tenderness. There is no guarding or rebound.     Comments: Soft , Protuberant abdomen  Musculoskeletal:     Lumbar back: No swelling, edema, spasms, tenderness or bony tenderness. Normal range of motion.     Comments: Full range of motion with flexion, tension, lateral side bends. No bony tenderness. Tenderness with palpation of bilateral SI joints.  No pain, numbness, tingling elicited with single leg raise bilaterally.   Skin:    General: Skin is warm and dry.  Neurological:     Mental Status: She is alert.     Sensory: No sensory deficit.     Deep Tendon Reflexes:     Reflex Scores:      Patellar reflexes are 2+ on the right side and 2+ on the left side.    Comments: Sensation and strength intact bilateral lower  extremities.  Psychiatric:        Speech: Speech normal.        Behavior: Behavior normal.        Thought Content: Thought content normal.        Assessment & Plan:    Problem List Items Addressed  This Visit       Other   Abdominal distension     Abdominal distention on exam today.  Discussed changing body habitus versus abdominal pathology including ovarian, liver.  Advised consideration for imaging to rule out malignancy, liver disease with Ct a/p.  Patient will consider this and let me know.  Pending celiac test for thoroughness      Relevant Orders   Celiac Disease Ab Screen w/Rfx   Low back pain - Primary    Known history of degenerative disc disease in lumbar spine based on previous images (CT abdomen and pelvis 2/11 2020).  Pending updating pelvis and lumbar films at this time.  We discussed various options for pain control.  She has done well on tramadol in the past.  I agreed to provide tramadol 50 mg to be used as needed.  Counseled her on side effects of tramadol and that medication is a controlled substance.  I also encouraged her to use Tylenol arthritis for mild to moderate pain.  Advised she may also trial Salonpas pain patch.  I looked up patient on [place] Controlled Substances Reporting System PMP AWARE and saw no activity that raised concern of inappropriate use.  She will let me know how she is doing and if no improvement discussed referral to Dr. Leanord Hawking.       Relevant Medications   traMADol (ULTRAM) 50 MG tablet   Other Relevant Orders   DG Lumbar Spine Complete   DG HIPS BILAT WITH PELVIS 2V     I am having Rosealie Platter start on traMADol. I am also having her maintain her Multiple Vitamins-Minerals (PRESERVISION AREDS 2 PO), Multiple Vitamins-Minerals (CENTRUM SILVER PO), Misc Natural Products (OSTEO BI-FLEX JOINT SHIELD PO), fluticasone, latanoprost, denosumab, gabapentin, Vitamin D3, Propylene Glycol (SYSTANE BALANCE OP), Boswellia-Glucosamine-Vit D (OSTEO  BI-FLEX ONE PER DAY PO), pravastatin, levothyroxine, diclofenac Sodium, and amLODipine.   Meds ordered this encounter  Medications   traMADol (ULTRAM) 50 MG tablet    Sig: Take 1 tablet (50 mg total) by mouth daily as needed.    Dispense:  30 tablet    Refill:  1    Order Specific Question:   Supervising Provider    Answer:   Crecencio Mc [[id number]]    Return precautions given.   Risks, benefits, and alternatives of the medications and treatment plan prescribed today were discussed, and patient expressed understanding.   Education regarding symptom management and diagnosis given to patient on AVS.  Continue to follow with Crecencio Mc, MD for routine health maintenance.   [name] and I agreed with plan.   [name], FNP

## 2021-08-31 NOTE — Assessment & Plan Note (Addendum)
Known history of degenerative disc disease in lumbar spine based on previous images (CT abdomen and pelvis 2/11 2020).  Pending updating pelvis and lumbar films at this time.  We discussed various options for pain control.  She has done well on tramadol in the past.  I agreed to provide tramadol 50 mg to be used as needed.  Counseled her on side effects of tramadol and that medication is a controlled substance.  I also encouraged her to use Tylenol arthritis for mild to moderate pain.  Advised she may also trial Salonpas pain patch.  I looked up patient on [place] Controlled Substances Reporting System PMP AWARE and saw no activity that raised concern of inappropriate use.  She will let me know how she is doing and if no improvement discussed referral to Dr. Leanord Hawking.

## 2021-08-31 NOTE — Patient Instructions (Addendum)
I have sent in tramadol. Please do not take tramadol with sedating medications including alcohol. You may decrease gabapentin to '100mg'$  three times per day and then discontinue if not effective.   As discussed, let's start by scheduling Tylenol Arthritis which is a '650mg'$  tablet .   You may take 1-2 tablets every 8 hours ( scheduled) with maximum of 6 tablets per day.   For example , you could take two tablets in the morning ( 8am) and then two tablets again at 4pm.   Maximum daily dose of acetaminophen 4 g per day from all sources.  If you are taking another medication which includes acetaminophen (Tylenol) which may be in cough and cold preparations or pain medication such as Percocet, you will need to factor that into your total daily dose to be safe.  Please let me know if any questions  May also trial salon pas pain patch, heat  Let me know if you would like me to order Ct abdomen to investigate abdominal distention/bloating.

## 2021-08-31 NOTE — Assessment & Plan Note (Addendum)
Abdominal distention on exam today.  Discussed changing body habitus versus abdominal pathology including ovarian, liver.  Advised consideration for imaging to rule out malignancy, liver disease with Ct a/p.  Patient will consider this and let me know.  Pending celiac test for thoroughness

## 2021-09-01 LAB — CELIAC DISEASE AB SCREEN W/RFX
Antigliadin Abs, IgA: 4 units (ref 0–19)
IgA/Immunoglobulin A, Serum: 145 mg/dL (ref 64–422)
Transglutaminase IgA: <2 U/mL (ref 0–3)

## 2021-09-02 ENCOUNTER — Telehealth: Payer: Self-pay | Admitting: Internal Medicine

## 2021-09-02 NOTE — Telephone Encounter (Signed)
Pt would like to be called regarding her xray results.

## 2021-09-02 NOTE — Telephone Encounter (Signed)
Patient states she saw [name], FNP, on Monday (08/31/2021) and she would like to know her x-ray and lab results.    Patient states that yesterday (09/01/2021) she took her shoe and sock off, and her foot was soaked.  Patient states her foot was swollen.  Patient states she saw a nurse at [facility] at [place] and was told that she has pitting edema in her left leg.  Patient states the nurse wrapped it with tape and recommended she wear compression socks/hose and elevate the area.

## 2021-09-04 NOTE — Telephone Encounter (Signed)
Spoke to patient in regards to her message and was able to schedule her an appt for Tues 8/29

## 2021-09-04 NOTE — Telephone Encounter (Signed)
Patient called and is still waiting for a phone call. See note below. Vidal Schwalbe is out of office, 09/04/2021

## 2021-09-04 NOTE — Telephone Encounter (Signed)
Lvm to call back to office

## 2021-09-08 ENCOUNTER — Telehealth: Payer: Self-pay | Admitting: Family

## 2021-09-08 ENCOUNTER — Encounter: Payer: Self-pay | Admitting: Family

## 2021-09-08 ENCOUNTER — Ambulatory Visit (INDEPENDENT_AMBULATORY_CARE_PROVIDER_SITE_OTHER): Payer: PPO | Admitting: Family

## 2021-09-08 VITALS — BP 160/78 | HR 64 | Temp 97.7°F | Ht 60.0 in | Wt 106.8 lb

## 2021-09-08 DIAGNOSIS — M7989 Other specified soft tissue disorders: Secondary | ICD-10-CM

## 2021-09-08 DIAGNOSIS — G8929 Other chronic pain: Secondary | ICD-10-CM | POA: Diagnosis not present

## 2021-09-08 DIAGNOSIS — R140 Abdominal distension (gaseous): Secondary | ICD-10-CM

## 2021-09-08 DIAGNOSIS — M545 Low back pain, unspecified: Secondary | ICD-10-CM

## 2021-09-08 NOTE — Telephone Encounter (Signed)
Call pharmacy Patient reports when she picked up tramadol last week they only provided her with 7 tablets.  I do not know why as I prescribed 30 tablets.  Please clarify here and asked them to refill today with 30 tablets

## 2021-09-08 NOTE — Progress Notes (Signed)
Subjective:    Patient ID: Zea Kostka, female    DOB: 03/16/1927, 88 y.o.   MRN: [id number]  CC: Capri Veals is a 88 y.o. female who presents today for follow up.   HPI: Accompanied by son  Here today for concern for bilateral leg swelling  She is wearing compression stockings  She has clear liquid of left leg which was in sock.  She is doing balance class without SOB.   She has bm , hard, each morning. Then stool will be loose.   She has chronic nocturia.  She has tried anticholinergics in the past as prescribed by urology  No blood in the stool    She has been taking tramadol and tylenol arthritis with relief   No h/o hysterotomy          Aortic atherosclerosis- compliant with pravastatin '20mg'$    Osteoporosis  -compliant with prolia   HISTORY:  Past Medical History:  Diagnosis Date   Allergy    Arthritis    Closed fracture of left superior pubic ramus, with routine healing, subsequent encounter 08/24/2016   Glaucoma    History of chickenpox    History of recurrent UTIs    Hypertension    Macular degeneration    Thyroid disease    Past Surgical History:  Procedure Laterality Date   BILATERAL CARPAL TUNNEL RELEASE  1999-2000   CATARACT EXTRACTION, BILATERAL  2013   Family History  Problem Relation Age of Onset   Stroke Mother    Cancer Mother 96       colon   Cancer Father        bone   Cancer Son        Jaw   Stroke Sister    Breast cancer Neg Hx     Allergies: Sulfa antibiotics Current Outpatient Medications on File Prior to Visit  Medication Sig Dispense Refill   amLODipine (NORVASC) 5 MG tablet TAKE 1 TABLET BY MOUTH DAILY. DOSE INCREASE. 90 tablet 1   Boswellia-Glucosamine-Vit D (OSTEO BI-FLEX ONE PER DAY PO) Take 1 capsule by mouth daily.     Cholecalciferol (VITAMIN D3) 50 MCG (2000 UT) capsule Take 2,000 Units by mouth daily.     denosumab (PROLIA) 60 MG/ML SOSY injection Inject 60 mg into the skin every 6 (six) months.      diclofenac Sodium (VOLTAREN) 1 % GEL Apply 2 g topically 4 (four) times daily.     fluticasone (FLONASE) 50 MCG/ACT nasal spray Place into both nostrils daily. Reported on 01/17/2015     gabapentin (NEURONTIN) 100 MG capsule Take 1 capsule by mouth with breakfast, with lunch, and with evening meal.     latanoprost (XALATAN) 0.005 % ophthalmic solution INSTILL 1 DROP IN THE LEFT EYE AT NIGHT  5   levothyroxine (SYNTHROID) 50 MCG tablet TAKE 1 TABLET EVERY DAY ON EMPTY STOMACHWITH A GLASS OF WATER AT LEAST 30-60 MINBEFORE BREAKFAST 90 tablet 0   Misc Natural Products (OSTEO BI-FLEX JOINT SHIELD PO) Take by mouth.     Multiple Vitamins-Minerals (CENTRUM SILVER PO) Take by mouth.     Multiple Vitamins-Minerals (PRESERVISION AREDS 2 PO) Take by mouth.     pravastatin (PRAVACHOL) 20 MG tablet TAKE 1 TABLET BY MOUTH DAILY 90 tablet 3   Propylene Glycol (SYSTANE BALANCE OP) Apply to eye.     traMADol (ULTRAM) 50 MG tablet Take 1 tablet (50 mg total) by mouth daily as needed. 30 tablet 1   No current facility-administered medications on  file prior to visit.    Social History   Tobacco Use   Smoking status: Former   Smokeless tobacco: Never  Scientific laboratory technician Use: Never used  Substance Use Topics   Alcohol use: Yes    Alcohol/week: 5.0 standard drinks of alcohol    Types: 5 Standard drinks or equivalent per week    Comment: with dinner   Drug use: No    Review of Systems  Constitutional:  Negative for chills and fever.  Respiratory:  Negative for cough.   Cardiovascular:  Positive for leg swelling. Negative for chest pain and palpitations.  Gastrointestinal:  Positive for abdominal distention and constipation. Negative for nausea and vomiting.  Musculoskeletal:  Positive for back pain.      Objective:    BP (!) 160/78 (BP Location: Left Arm, Patient Position: Sitting, Cuff Size: Normal)   Pulse 64   Temp 97.7 F (36.5 C) (Oral)   Ht 5' (1.524 m)   Wt 106 lb 12.8 oz (48.4 kg)   LMP   (LMP Unknown)   SpO2 96%   BMI 20.86 kg/m  BP Readings from Last 3 Encounters:  09/08/21 (!) 160/78  08/31/21 132/82  07/01/21 (!) 142/64   Wt Readings from Last 3 Encounters:  09/08/21 106 lb 12.8 oz (48.4 kg)  08/31/21 112 lb (50.8 kg)  07/01/21 113 lb 3.2 oz (51.3 kg)    Physical Exam Vitals reviewed.  Constitutional:      Appearance: She is well-developed.  Eyes:     Conjunctiva/sclera: Conjunctivae normal.  Cardiovascular:     Rate and Rhythm: Normal rate and regular rhythm.     Pulses: Normal pulses.     Heart sounds: Normal heart sounds.     Comments: No LE edema, palpable cords or masses. No erythema or increased warmth. No asymmetry in calf size when compared bilaterally LE hair growth symmetric and present. No discoloration or varicosities noted. LE warm and palpable pedal pulses.  Pulmonary:     Effort: Pulmonary effort is normal.     Breath sounds: Normal breath sounds. No wheezing, rhonchi or rales.  Genitourinary:    Comments: No evidence of prolapse uterus on speculum exam.  No rectal prolapse Musculoskeletal:     Right lower leg: No edema.     Left lower leg: No edema.  Skin:    General: Skin is warm and dry.  Neurological:     Mental Status: She is alert.  Psychiatric:        Speech: Speech normal.        Behavior: Behavior normal.        Thought Content: Thought content normal.        Assessment & Plan:   Problem List Items Addressed This Visit       Other   Abdominal distension - Primary    Etiology includes constipation.  No evidence of rectal prolapse on exam.  Pending CT abdomen pelvis to exclude ovarian pathology.  Advised to start Metamucil.  Close follow-up.      Relevant Orders   CT ABDOMEN PELVIS W CONTRAST   Leg swelling    Resolved at this time.  Counseled patient on conservative measures at home including low-sodium diet diet, compression and elevation.  She will continue to monitor      Low back pain    Chronic,  stable.  Continue tramadol 50 mg, Tylenol arthritis.Consider consult with Dr Leanord Hawking at [facility] clinic for evaluation , patient politely declines  at this time        I am having [name] maintain her Multiple Vitamins-Minerals (PRESERVISION AREDS 2 PO), Multiple Vitamins-Minerals (CENTRUM SILVER PO), Misc Natural Products (OSTEO BI-FLEX JOINT SHIELD PO), fluticasone, latanoprost, denosumab, gabapentin, Vitamin D3, Propylene Glycol (SYSTANE BALANCE OP), Boswellia-Glucosamine-Vit D (OSTEO BI-FLEX ONE PER DAY PO), pravastatin, levothyroxine, diclofenac Sodium, amLODipine, and traMADol.   No orders of the defined types were placed in this encounter.   Return precautions given.   Risks, benefits, and alternatives of the medications and treatment plan prescribed today were discussed, and patient expressed understanding.   Education regarding symptom management and diagnosis given to patient on AVS.  Continue to follow with Crecencio Mc, MD for routine health maintenance.   [name] and I agreed with plan.   [name], FNP

## 2021-09-08 NOTE — Patient Instructions (Addendum)
Trial of metamucil 3.4 g daily to three times daily to bulk stool.  You may continue tramadol daily as needed for scheduling Tylenol arthritis as discussed  Consider consult with Dr Leanord Hawking at [facility] clinic for evaluation of continued low back and hip pain  I have ordered CT abdomen and pelvis to further evaluate your abdomen.  For leg swelling, please follow low-sodium diet, continue elevate your legs and wear compression stockings.  Most certainly if were to worsen, please let me know  We will follow you closely

## 2021-09-10 NOTE — Telephone Encounter (Signed)
Called [facility] pharmacy and Spoke to Tower and she stated that sometimes the insurance company associates the "tramadol" as  a "narcotic" even though it is not a scheduled 2 drug, so that was the reason they only filled for 7 days in the beginning but on the 30th of August they refilled another 21 and they should not have an issue with the refill again.

## 2021-09-10 NOTE — Telephone Encounter (Signed)
LVM to call back to office  

## 2021-09-10 NOTE — Telephone Encounter (Signed)
Patient states she is returning our call.  I read Gloria Carr, CMA's message to patient and patient states she did receive the refill from 09/09/2021 of 21.

## 2021-09-15 NOTE — Telephone Encounter (Signed)
Noted  

## 2021-09-16 ENCOUNTER — Ambulatory Visit
Admission: RE | Admit: 2021-09-16 | Discharge: 2021-09-16 | Disposition: A | Discharge status: Home / Self Care | Payer: PPO | Source: Ambulatory Visit | Attending: Family | Admitting: Family

## 2021-09-16 DIAGNOSIS — R140 Abdominal distension (gaseous): Secondary | ICD-10-CM | POA: Diagnosis not present

## 2021-09-16 DIAGNOSIS — N281 Cyst of kidney, acquired: Secondary | ICD-10-CM | POA: Diagnosis not present

## 2021-09-16 DIAGNOSIS — K7689 Other specified diseases of liver: Secondary | ICD-10-CM | POA: Diagnosis not present

## 2021-09-16 DIAGNOSIS — M7989 Other specified soft tissue disorders: Secondary | ICD-10-CM | POA: Insufficient documentation

## 2021-09-16 LAB — POCT I-STAT CREATININE: Creatinine, Ser: 0.8 mg/dL (ref 0.44–1.00)

## 2021-09-16 MED ORDER — IOHEXOL 300 MG/ML  SOLN
80.0000 mL | Freq: Once | INTRAMUSCULAR | Status: AC | PRN
  Administered 2021-09-16: 80 mL via INTRAVENOUS

## 2021-09-16 NOTE — Assessment & Plan Note (Signed)
Resolved at this time.  Counseled patient on conservative measures at home including low-sodium diet diet, compression and elevation.  She will continue to monitor

## 2021-09-16 NOTE — Assessment & Plan Note (Addendum)
Chronic, stable.  Continue tramadol 50 mg, Tylenol arthritis.Consider consult with Dr Leanord Hawking at [facility] clinic for evaluation , patient politely declines at this time

## 2021-09-16 NOTE — Assessment & Plan Note (Addendum)
Etiology includes constipation.  No evidence of rectal prolapse on exam.  Pending CT abdomen pelvis to exclude ovarian pathology.  Advised to start Metamucil.  Close follow-up.

## 2021-09-18 ENCOUNTER — Other Ambulatory Visit: Payer: Self-pay | Admitting: Internal Medicine

## 2021-09-29 ENCOUNTER — Other Ambulatory Visit: Payer: Self-pay | Admitting: Family

## 2021-09-29 DIAGNOSIS — R935 Abnormal findings on diagnostic imaging of other abdominal regions, including retroperitoneum: Secondary | ICD-10-CM

## 2021-09-30 ENCOUNTER — Telehealth: Payer: Self-pay

## 2021-09-30 NOTE — Telephone Encounter (Signed)
LVM to call back to go over results 

## 2021-10-01 ENCOUNTER — Other Ambulatory Visit: Payer: Self-pay | Admitting: Internal Medicine

## 2021-10-08 ENCOUNTER — Encounter: Payer: Self-pay | Admitting: Internal Medicine

## 2021-10-08 ENCOUNTER — Telehealth: Payer: Self-pay | Admitting: *Deleted

## 2021-10-08 NOTE — Telephone Encounter (Signed)
Patient paid: $276   Summary Visit 59, Tx 450 Patient: Tynetta Bachmann [[id number]] Department: [facility] Location: [facility] Service date: 04/23/2021 Provider:  Crecencio Mc, MD Billing provider: Crecencio Mc, MD Zara Chess NPI: [id number] Diagnoses: 1) M81.0 - Age-related osteoporosis without current pathological fracture [Active] Modifier: None Quantity: 60 Source: [facility] Ambulatory Post date: 04/23/2021  Statement Intervention  Status: Excluded from Statement Processing Rule: [place]   Balance: 0.00 Original amount: 2,400.00 Aging: 04/23/2021 (168 days) Claim: N/A      Self-Pay   Balance: 0.00 Original amount: 0.00 Aging: 05/19/2021 (142 days) Statement: 05/19/2021      Matching Transactions  # Dep Date Posted Description Match Amt 451 05/18/21 05/19/21 2000 - INSURANCE PAYMENT  (INSURANCE) - [employer] -[phone] 05/18/21 05/19/21 3000 - CONTRACTUAL WRITE-OFF (INSURANCE) -1,018.92 453 05/18/21 05/19/21 5128 - SEQUESTRATION ADJUSTMENT -22.10 454 06/09/21 06/09/21 1000 - PATIENT PAYMENT (ACCOUNT) -276.22  Invoices

## 2021-10-08 NOTE — Telephone Encounter (Signed)
Tried to reach patient by phone & sent mychart message to discuss amount due for next Prolia Inject.  Pt is responsible for $301 at time of injection  No Prior Auth required

## 2021-10-09 ENCOUNTER — Encounter: Payer: Self-pay | Admitting: Internal Medicine

## 2021-10-09 ENCOUNTER — Ambulatory Visit (INDEPENDENT_AMBULATORY_CARE_PROVIDER_SITE_OTHER): Payer: PPO | Admitting: Internal Medicine

## 2021-10-09 DIAGNOSIS — I10 Essential (primary) hypertension: Secondary | ICD-10-CM | POA: Diagnosis not present

## 2021-10-09 DIAGNOSIS — K449 Diaphragmatic hernia without obstruction or gangrene: Secondary | ICD-10-CM | POA: Diagnosis not present

## 2021-10-09 DIAGNOSIS — E7800 Pure hypercholesterolemia, unspecified: Secondary | ICD-10-CM

## 2021-10-09 DIAGNOSIS — G609 Hereditary and idiopathic neuropathy, unspecified: Secondary | ICD-10-CM

## 2021-10-09 DIAGNOSIS — M48061 Spinal stenosis, lumbar region without neurogenic claudication: Secondary | ICD-10-CM

## 2021-10-09 DIAGNOSIS — I700 Atherosclerosis of aorta: Secondary | ICD-10-CM | POA: Insufficient documentation

## 2021-10-09 DIAGNOSIS — K8689 Other specified diseases of pancreas: Secondary | ICD-10-CM

## 2021-10-09 MED ORDER — ATORVASTATIN CALCIUM 20 MG PO TABS: 20.0000 mg | ORAL_TABLET | Freq: Every day | ORAL | 3 refills | Status: DC

## 2021-10-09 NOTE — Progress Notes (Unsigned)
Subjective:  Patient ID: Gloria Carr, female    DOB: [birth year]  Age: 88 y.o. MRN: [id number]  CC: There were no encounter diagnoses.   HPI Gloria Carr presents for FOLLOW UP ON RECENT EVALUATION BY NP ARNETTE  Chief Complaint  Patient presents with   Follow-up    1 month follow up    1) abdominal distension:  constipation noted on  lumbar films and CT ab.  Taking metamucil daily for constipation.  First BM of the day is very hard.     2) pancreatic duct prominence : unchanged since Feb 2020 .  Weight stable.   3) chronic low back pain :  no longer using tramadol vs gabapentin.  Moderate DDD of lumbar spine no fractures. Worse after long periods of sitting. Improves with activity and managed now with tylenol   4) HTN:    5) aortic atherosclerosis  : taking pravastatin   6) neuropathy:  managed for years with gabapentin,  did not help and caused fluid retention    Outpatient Medications Prior to Visit  Medication Sig Dispense Refill   amLODipine (NORVASC) 5 MG tablet TAKE 1 TABLET BY MOUTH DAILY. DOSE INCREASE. 90 tablet 1   Boswellia-Glucosamine-Vit D (OSTEO BI-FLEX ONE PER DAY PO) Take 1 capsule by mouth daily.     Cholecalciferol (VITAMIN D3) 50 MCG (2000 UT) capsule Take 2,000 Units by mouth daily.     denosumab (PROLIA) 60 MG/ML SOSY injection Inject 60 mg into the skin every 6 (six) months.     diclofenac Sodium (VOLTAREN) 1 % GEL Apply 2 g topically 4 (four) times daily.     fluticasone (FLONASE) 50 MCG/ACT nasal spray Place into both nostrils daily. Reported on 01/17/2015     gabapentin (NEURONTIN) 100 MG capsule Take 1 capsule by mouth with breakfast, with lunch, and with evening meal.     latanoprost (XALATAN) 0.005 % ophthalmic solution INSTILL 1 DROP IN THE LEFT EYE AT NIGHT  5   levothyroxine (SYNTHROID) 50 MCG tablet TAKE 1 TABLET EVERY DAY ON EMPTY STOMACHWITH A GLASS OF WATER AT LEAST 30-60 MINBEFORE BREAKFAST 90 tablet 0   Misc Natural Products (OSTEO BI-FLEX  JOINT SHIELD PO) Take by mouth.     Multiple Vitamins-Minerals (CENTRUM SILVER PO) Take by mouth.     Multiple Vitamins-Minerals (PRESERVISION AREDS 2 PO) Take by mouth.     pravastatin (PRAVACHOL) 20 MG tablet TAKE 1 TABLET BY MOUTH DAILY 90 tablet 3   Propylene Glycol (SYSTANE BALANCE OP) Apply to eye.     traMADol (ULTRAM) 50 MG tablet Take 1 tablet (50 mg total) by mouth daily as needed. (Patient not taking: Reported on 10/09/2021) 30 tablet 1   No facility-administered medications prior to visit.    Review of Systems;  Patient denies headache, fevers, malaise, unintentional weight loss, skin rash, eye pain, sinus congestion and sinus pain, sore throat, dysphagia,  hemoptysis , cough, dyspnea, wheezing, chest pain, palpitations, orthopnea, edema, abdominal pain, nausea, melena, diarrhea, constipation, flank pain, dysuria, hematuria, urinary  Frequency, nocturia, numbness, tingling, seizures,  Focal weakness, Loss of consciousness,  Tremor, insomnia, depression, anxiety, and suicidal ideation.      Objective:  LMP  (LMP Unknown)   BP Readings from Last 3 Encounters:  09/08/21 (!) 160/78  08/31/21 132/82  07/01/21 (!) 142/64    Wt Readings from Last 3 Encounters:  09/08/21 106 lb 12.8 oz (48.4 kg)  08/31/21 112 lb (50.8 kg)  07/01/21 113 lb 3.2 oz (51.3 kg)  General appearance: alert, cooperative and appears stated age Ears: normal TM's and external ear canals both ears Throat: lips, mucosa, and tongue normal; teeth and gums normal Neck: no adenopathy, no carotid bruit, supple, symmetrical, trachea midline and thyroid not enlarged, symmetric, no tenderness/mass/nodules Back: symmetric, no curvature. ROM normal. No CVA tenderness. Lungs: clear to auscultation bilaterally Heart: regular rate and rhythm, S1, S2 normal, no murmur, click, rub or gallop Abdomen: soft, non-tender; bowel sounds normal; no masses,  no organomegaly Pulses: 2+ and symmetric Skin: Skin color, texture,  turgor normal. No rashes or lesions Lymph nodes: Cervical, supraclavicular, and axillary nodes normal. Neuro:  awake and interactive with normal mood and affect. Higher cortical functions are normal. Speech is clear without word-finding difficulty or dysarthria. Extraocular movements are intact. Visual fields of both eyes are grossly intact. Sensation to light touch is grossly intact bilaterally of upper and lower extremities. Motor examination shows 4+/5 symmetric hand grip and upper extremity and 5/5 lower extremity strength. There is no pronation or drift. Gait is non-ataxic   Lab Results  Component Value Date   HGBA1C 5.9 06/29/2021   HGBA1C 6.1 01/16/2021   HGBA1C 6.0 07/16/2020    Lab Results  Component Value Date   CREATININE 0.80 09/16/2021   CREATININE 0.78 06/29/2021   CREATININE 0.81 01/16/2021    Lab Results  Component Value Date   WBC 7.3 06/29/2021   HGB 14.5 06/29/2021   HCT 44.5 06/29/2021   PLT 257.0 06/29/2021   GLUCOSE 93 06/29/2021   CHOL 180 06/29/2021   TRIG 71.0 06/29/2021   HDL 89.00 06/29/2021   LDLCALC 77 06/29/2021   ALT 14 06/29/2021   AST 18 06/29/2021   NA 143 06/29/2021   K 4.2 06/29/2021   CL 105 06/29/2021   CREATININE 0.80 09/16/2021   BUN 17 06/29/2021   CO2 31 06/29/2021   TSH 2.51 06/29/2021   HGBA1C 5.9 06/29/2021   MICROALBUR 4.9 (H) 01/16/2021    CT ABDOMEN PELVIS W CONTRAST  Result Date: 09/17/2021 CLINICAL DATA:  Abdominal distension and bloating. Constipation. Evaluate ovarian pathology. EXAM: CT ABDOMEN AND PELVIS WITH CONTRAST TECHNIQUE: Multidetector CT imaging of the abdomen and pelvis was performed using the standard protocol following bolus administration of intravenous contrast. RADIATION DOSE REDUCTION: This exam was performed according to the departmental dose-optimization program which includes automated exposure control, adjustment of the mA and/or kV according to patient size and/or use of iterative reconstruction  technique. CONTRAST:  50m OMNIPAQUE IOHEXOL 300 MG/ML  SOLN COMPARISON:  February 21 2018 FINDINGS: Lower chest: No acute abnormality. Hepatobiliary: Nodular contour of the liver is identified unchanged. Probable fatty infiltration of the liver with areas of sparing are stable compared prior exam. The gallbladder and biliary tree are normal. Pancreas: Pancreatic duct prominence is unchanged compared prior exam. The pancreas is otherwise unremarkable. Spleen: Normal in size without focal abnormality. Adrenals/Urinary Tract: The right adrenal gland is normal. Atrophy of left adrenal gland is unchanged. Small focal areas of scar are identified in both kidneys. Bilateral parapelvic renal cysts are identified unchanged. No follow-up is necessary. No hydronephrosis bilaterally. The bladder is normal. Stomach/Bowel: Small hiatal hernia is identified. The stomach is otherwise normal. There is no small bowel obstruction. Diverticulosis of the colon without diverticulitis noted. Moderate bowel content is identified throughout colon. The appendix is normal. Vascular/Lymphatic: Aortic atherosclerosis. No enlarged abdominal or pelvic lymph nodes. Reproductive: Uterus and bilateral adnexa are unremarkable. No abnormal masses are identified in bilateral adnexa. Other: None. Musculoskeletal: Degenerative  joint changes of the spine are noted. IMPRESSION: 1. No acute abnormality identified in the abdomen and pelvis. 2. No abnormal masses are identified in bilateral adnexa. 3. Moderate bowel content is identified throughout colon. This can be seen in constipation. Aortic Atherosclerosis (ICD10-I70.0). Electronically Signed   By: Abelardo Diesel M.D.   On: 09/17/2021 14:32    Assessment & Plan:   Problem List Items Addressed This Visit   None   I spent a total of   minutes with this patient in a face to face visit on the date of this encounter reviewing the last office visit with me in       ,  most recent visit with cardiology  ,    ,  patient's diet and exercise habits, home blood pressure /blod sugar readings, recent ER visit including labs and imaging studies ,   and post visit ordering of testing and therapeutics.    Follow-up: No follow-ups on file.   Crecencio Mc, MD

## 2021-10-09 NOTE — Assessment & Plan Note (Signed)
Reviewed findings of prior CT scan today..  Patient is taking pravastatin and is willing to switch to atorvastatin for a trial period

## 2021-10-09 NOTE — Patient Instructions (Addendum)
Continue the Osteo biflex and the additional daily D3  I am Changing pravastatin to atorvastatin to reduce your risk of stroke  Your blood pressure is fine as long as most OF YOUR readings are under 140/90.   I RECOMMEND THAT YOU CHECK YOUR BP ONCE A WEEK   Try using a glycerin suppository in the morning TO HELP YOU STOP   YOU DO NOT NEED TO SEE DR CHESNIS OR A GASTROENTEROLOGIST   YOU CAN TAKE ONE ADVIL OR ALEVE,   AND ONE TYLENOL UP TO 2 TIMES DAILY

## 2021-10-10 DIAGNOSIS — K8689 Other specified diseases of pancreas: Secondary | ICD-10-CM | POA: Insufficient documentation

## 2021-10-10 DIAGNOSIS — K449 Diaphragmatic hernia without obstruction or gangrene: Secondary | ICD-10-CM | POA: Insufficient documentation

## 2021-10-10 NOTE — Assessment & Plan Note (Signed)
Unchanged on current CT based on review of previous CT 2020 . Marland Kitchen Patient does not want workup

## 2021-10-10 NOTE — Assessment & Plan Note (Signed)
.  reviewed diagnosis ,,  Lifestyle changes to avoid symptoms

## 2021-10-10 NOTE — Assessment & Plan Note (Signed)
She has pain only with prolonged rest and declines  referral for E SI

## 2021-10-10 NOTE — Assessment & Plan Note (Signed)
Stopped gabapentin due to lack of percevied benefit and desire to limit medications

## 2021-10-10 NOTE — Assessment & Plan Note (Addendum)
Well controlled on current regimen. Renal function stable, no changes today.  Lab Results  Component Value Date   CREATININE 0.80 09/16/2021   Lab Results  Component Value Date   NA 143 06/29/2021   K 4.2 06/29/2021   CL 105 06/29/2021   CO2 31 06/29/2021

## 2021-10-10 NOTE — Assessment & Plan Note (Signed)
Managed with pravastatin . But given her aortic atheroclerosis shei swilling to try atorvastatn

## 2021-10-16 DIAGNOSIS — H401121 Primary open-angle glaucoma, left eye, mild stage: Secondary | ICD-10-CM | POA: Diagnosis not present

## 2021-11-06 NOTE — Telephone Encounter (Signed)
Last read by [name] at 10:10 AM on 10/09/2021.

## 2021-11-13 ENCOUNTER — Ambulatory Visit: Payer: PPO | Admitting: Physician Assistant

## 2021-12-18 ENCOUNTER — Telehealth: Payer: Self-pay | Admitting: Internal Medicine

## 2021-12-18 ENCOUNTER — Other Ambulatory Visit: Payer: Self-pay | Admitting: Internal Medicine

## 2021-12-18 NOTE — Telephone Encounter (Signed)
Patient: Gloria Carr  TRRNH:[phone] Provider: Dr Bishop Dublin with patient schedule AWV, she stated that she was taken off gabapentin and prescribed meds for her nerve pain and they took her off of it.  She stated do she need to start back taking the gabapentin. Please advise.

## 2021-12-21 DIAGNOSIS — H353122 Nonexudative age-related macular degeneration, left eye, intermediate dry stage: Secondary | ICD-10-CM | POA: Diagnosis not present

## 2021-12-24 NOTE — Telephone Encounter (Signed)
Spoke with pt and she stated that the gabapentin was stopped and she was started on Tramadol. She stopped the Tramadol because she didn't like the idea of it being a controlled substance. Pt was advised that gabapentin and tramadol are used to treat pain and pt stated that she was not having any pain her feet just felt "spongy". Pt was then advised by [place], NP to hold off on going back on the gabapentin until she follows up with Dr. Derrel Nip on 12/31/2021.Pt gave a verbal understanding.

## 2021-12-28 ENCOUNTER — Ambulatory Visit (INDEPENDENT_AMBULATORY_CARE_PROVIDER_SITE_OTHER): Payer: PPO

## 2021-12-28 VITALS — Ht 59.0 in | Wt 106.0 lb

## 2021-12-28 DIAGNOSIS — Z0000 Encounter for general adult medical examination without abnormal findings: Secondary | ICD-10-CM | POA: Diagnosis not present

## 2021-12-28 NOTE — Progress Notes (Signed)
Subjective:   Gloria Carr is a 88 y.o. female who presents for Medicare Annual (Subsequent) preventive examination.  Review of Systems    No ROS.  Medicare Wellness Virtual Visit.  Visual/audio telehealth visit, UTA vital signs.   See social history for additional risk factors.   Cardiac Risk Factors include: advanced age (>60mn, >>56women);hypertension     Objective:    Today's Vitals   12/28/21 1515  Weight: 106 lb (48.1 kg)  Height: '4\' 11"'$  (1.499 m)   Body mass index is 21.41 kg/m.     12/28/2021    3:23 PM 12/26/2020   12:36 PM 12/26/2019   12:47 PM 12/25/2018   12:24 PM 02/21/2018   11:12 AM 12/21/2017    1:37 PM 12/09/2016    1:32 PM  Advanced Directives  Does Patient Have a Medical Advance Directive? Yes Yes Yes Yes No Yes Yes  Type of AParamedicof AGrosse Pointe WoodsLiving will HJerauldLiving will HCloverdaleLiving will HMartinsvilleLiving will  HRattanLiving will HChase CrossingLiving will  Does patient want to make changes to medical advance directive? No - Patient declined No - Patient declined No - Patient declined No - Patient declined  No - Patient declined No - Patient declined  Copy of H[place]in Chart? Yes - validated most recent copy scanned in chart (See row information) Yes - validated most recent copy scanned in chart (See row information) Yes - validated most recent copy scanned in chart (See row information) Yes - validated most recent copy scanned in chart (See row information)  Yes - validated most recent copy scanned in chart (See row information) No - copy requested  Would patient like information on creating a medical advance directive?     No - Patient declined      Current Medications (verified) Outpatient Encounter Medications as of 12/28/2021  Medication Sig   amLODipine (NORVASC) 5 MG tablet TAKE 1 TABLET BY MOUTH  DAILY. DOSE INCREASE.   atorvastatin (LIPITOR) 20 MG tablet Take 1 tablet (20 mg total) by mouth daily.   Boswellia-Glucosamine-Vit D (OSTEO BI-FLEX ONE PER DAY PO) Take 1 capsule by mouth daily.   Cholecalciferol (VITAMIN D3) 50 MCG (2000 UT) capsule Take 2,000 Units by mouth daily.   denosumab (PROLIA) 60 MG/ML SOSY injection Inject 60 mg into the skin every 6 (six) months.   diclofenac Sodium (VOLTAREN) 1 % GEL Apply 2 g topically 4 (four) times daily.   fluticasone (FLONASE) 50 MCG/ACT nasal spray Place into both nostrils daily. Reported on 01/17/2015   latanoprost (XALATAN) 0.005 % ophthalmic solution INSTILL 1 DROP IN THE LEFT EYE AT NIGHT   levothyroxine (SYNTHROID) 50 MCG tablet TAKE 1 TABLET EVERY DAY ON EMPTY STOMACHWITH A GLASS OF WATER AT LEAST 30-60 MINBEFORE BREAKFAST   Misc Natural Products (OSTEO BI-FLEX JOINT SHIELD PO) Take by mouth.   Multiple Vitamins-Minerals (CENTRUM SILVER PO) Take by mouth.   Multiple Vitamins-Minerals (PRESERVISION AREDS 2 PO) Take by mouth.   Propylene Glycol (SYSTANE BALANCE OP) Apply to eye.   No facility-administered encounter medications on file as of 12/28/2021.    Allergies (verified) Sulfa antibiotics   History: Past Medical History:  Diagnosis Date   Allergy    Arthritis    Closed fracture of left superior pubic ramus, with routine healing, subsequent encounter 08/24/2016   Glaucoma    History of chickenpox    History of recurrent  UTIs    Hypertension    Macular degeneration    Thyroid disease    Past Surgical History:  Procedure Laterality Date   BILATERAL CARPAL TUNNEL RELEASE  1999-2000   CATARACT EXTRACTION, BILATERAL  2013   Family History  Problem Relation Age of Onset   Stroke Mother    Cancer Mother 42       colon   Cancer Father        bone   Cancer Son        Jaw   Stroke Sister    Breast cancer Neg Hx    Social History   Socioeconomic History   Marital status: Widowed    Spouse name: Not on file   Number  of children: Not on file   Years of education: Not on file   Highest education level: Not on file  Occupational History   Not on file  Tobacco Use   Smoking status: Former   Smokeless tobacco: Never  Vaping Use   Vaping Use: Never used  Substance and Sexual Activity   Alcohol use: Yes    Alcohol/week: 5.0 standard drinks of alcohol    Types: 5 Standard drinks or equivalent per week    Comment: with dinner   Drug use: No   Sexual activity: Not Currently    Birth control/protection: Post-menopausal  Other Topics Concern   Not on file  Social History Narrative   Not on file   Social Determinants of Health   Financial Resource Strain: Low Risk  (12/28/2021)   Overall Financial Resource Strain (CARDIA)    Difficulty of Paying Living Expenses: Not hard at all  Food Insecurity: No Food Insecurity (12/28/2021)   Hunger Vital Sign    Worried About Running Out of Food in the Last Year: Never true    Ran Out of Food in the Last Year: Never true  Transportation Needs: No Transportation Needs (12/28/2021)   PRAPARE - Hydrologist (Medical): No    Lack of Transportation (Non-Medical): No  Physical Activity: Sufficiently Active (12/28/2021)   Exercise Vital Sign    Days of Exercise per Week: 3 days    Minutes of Exercise per Session: 50 min  Stress: No Stress Concern Present (12/28/2021)   [place]    Feeling of Stress : Not at all  Social Connections: Unknown (12/28/2021)   Social Connection and Isolation Panel [NHANES]    Frequency of Communication with Friends and Family: More than three times a week    Frequency of Social Gatherings with Friends and Family: More than three times a week    Attends Religious Services: Not on Advertising copywriter or Organizations: Not on file    Attends Archivist Meetings: Not on file    Marital Status: Not on file    Tobacco  Counseling Counseling given: Not Answered   Clinical Intake:  Pre-visit preparation completed: Yes        Diabetes: No  How often do you need to have someone help you when you read instructions, pamphlets, or other written materials from your doctor or pharmacy?: 1 - Never    Interpreter Needed?: No      Activities of Daily Living    12/28/2021    3:24 PM  In your present state of health, do you have any difficulty performing the following activities:  Hearing? 0  Vision? 0  Difficulty concentrating or making decisions? 0  Walking or climbing stairs? 0  Dressing or bathing? 0  Doing errands, shopping? 1  Preparing Food and eating ? N  Using the Toilet? N  In the past six months, have you accidently leaked urine? Y  Comment Managed with daily pad  Do you have problems with loss of bowel control? N  Managing your Medications? N  Managing your Finances? N  Housekeeping or managing your Housekeeping? N    Patient Care Team: Crecencio Mc, MD as PCP - General (Internal Medicine)  Indicate any recent Medical Services you may have received from other than [facility] providers in the past year (date may be approximate).     Assessment:   This is a routine wellness examination for [place].  I connected with  [name] on 12/28/21 by a audio enabled telemedicine application and verified that I am speaking with the correct person using two identifiers.  Patient Location: Home  Provider Location: Office/Clinic  I discussed the limitations of evaluation and management by telemedicine. The patient expressed understanding and agreed to proceed.   Hearing/Vision screen Hearing Screening - Comments:: Patient is able to hear conversational tones without difficulty. No issues reported. Vision Screening - Comments:: Wears corrective lenses Cataract extraction, bilateral Macular degeneration Glaucoma They have seen their ophthalmologist.    Dietary issues and exercise  activities discussed: Current Exercise Habits: Home exercise routine, Time (Minutes): 45, Frequency (Times/Week): 3, Weekly Exercise (Minutes/Week): 135, Intensity: Mild Healthy diet   Goals Addressed             This Visit's Progress    Follow up with Provider       As needed       Depression Screen    12/28/2021    3:19 PM 10/09/2021    2:37 PM 08/31/2021    1:36 PM 07/01/2021   11:47 AM 01/16/2021   10:39 AM 12/26/2020   12:43 PM 07/16/2020   11:08 AM  PHQ 2/9 Scores  PHQ - 2 Score 0 0 0 0 0 0 0    Fall Risk    12/28/2021    3:28 PM 10/09/2021    2:37 PM 08/31/2021    1:36 PM 07/01/2021   11:46 AM 01/16/2021   10:39 AM  [place] in the past year? 1 0 0 1 0  Number falls in past yr: 0 0 0 0   Injury with Fall? 0 0 0 1   Risk for fall due to : Impaired balance/gait No Fall Risks No Fall Risks History of fall(s) No Fall Risks  Risk for fall due to: Comment Ambulates with walker      Follow up Falls evaluation completed Falls evaluation completed Falls evaluation completed Falls evaluation completed Falls evaluation completed    [place]: Home free of loose throw rugs in walkways, pet beds, electrical cords, etc? Yes  Adequate lighting in your home to reduce risk of falls? Yes   ASSISTIVE DEVICES UTILIZED TO PREVENT FALLS: Life alert? Yes  Use of a cane, walker or w/c? Yes , walker Grab bars in the bathroom? Yes  Shower chair or bench in shower? No  Elevated toilet seat or a handicapped toilet? No   TIMED UP AND GO: Was the test performed? No .   Cognitive Function:    12/09/2016    1:43 PM 12/10/2014    1:29 PM  MMSE - Mini Mental State Exam  Orientation to time 5 5  Orientation to Place 5 5  Registration 3 3  Attention/ Calculation 5 5  Recall 2 3  Language- name 2 objects 2 2  Language- repeat 1 1  Language- follow 3 step command 3 3  Language- read & follow direction 1 1  Write a sentence 1 1  Copy design  1 1  Total score 29 30        12/28/2021    3:29 PM 12/26/2020    1:11 PM 12/26/2019   12:57 PM 12/25/2018   12:30 PM 12/21/2017    1:40 PM  6CIT Screen  What Year? 0 points 0 points 0 points 0 points 0 points  What month? 0 points 0 points 0 points 0 points 0 points  What time? 0 points 0 points 0 points 0 points 0 points  Count back from 20 0 points  0 points 0 points 0 points  Months in reverse 0 points 4 points 2 points 0 points 0 points  Repeat phrase 0 points   0 points 0 points  Total Score 0 points   0 points 0 points    Immunizations Immunization History  Administered Date(s) Administered   Fluad Quad(high Dose 65+) 10/17/2019   Influenza, High Dose Seasonal PF 10/16/2015, 10/31/2017   Influenza-Unspecified 09/12/2014, 09/27/2016, 11/08/2018, 10/25/2020   PFIZER Comirnaty(Gray Top)Covid-19 Tri-Sucrose Vaccine 01/17/2019, 02/14/2019, 10/18/2019   PFIZER(Purple Top)SARS-COV-2 Vaccination 01/17/2019, 02/14/2019, 10/18/2019, 05/28/2020   Pneumococcal Conjugate-13 01/17/2015   Pneumococcal Polysaccharide-23 01/17/2011   Td 12/11/2015   Tdap 12/11/2015, 12/11/2015   Zoster Recombinat (Shingrix) 07/16/2020, 11/17/2020   Zoster, Live 07/15/2012   Screening Tests Health Maintenance  Topic Date Due   COVID-19 Vaccine (8 - 2023-24 season) 01/13/2022 (Originally 09/11/2021)   INFLUENZA VACCINE  04/11/2022 (Originally 08/11/2021)   Medicare Annual Wellness (AWV)  12/29/2022   DTaP/Tdap/Td (4 - Td or Tdap) 12/10/2025   Pneumonia Vaccine 62+ Years old  Completed   DEXA SCAN  Completed   Zoster Vaccines- Shingrix  Completed   HPV VACCINES  Aged Out    Health Maintenance There are no preventive care reminders to display for this patient.  Lung Cancer Screening: (Low Dose CT Chest recommended if Age 60-80 years, 30 pack-year currently smoking OR have quit w/in 15years.) does not qualify.   Hepatitis C Screening: does not qualify.  Vision Screening: Recommended annual  ophthalmology exams for early detection of glaucoma and other disorders of the eye.  Dental Screening: Recommended annual dental exams for proper oral hygiene  Community Resource Referral / Chronic Care Management: CRR required this visit?  No   CCM required this visit?  No      Plan:     I have personally reviewed and noted the following in the patient's chart:   Medical and social history Use of alcohol, tobacco or illicit drugs  Current medications and supplements including opioid prescriptions. Patient is not currently taking opioid prescriptions. Functional ability and status Nutritional status Physical activity Advanced directives List of other physicians Hospitalizations, surgeries, and ER visits in previous 12 months Vitals Screenings to include cognitive, depression, and falls Referrals and appointments  In addition, I have reviewed and discussed with patient certain preventive protocols, quality metrics, and best practice recommendations. A written personalized care plan for preventive services as well as general preventive health recommendations were provided to patient.     Leta Jungling, LPN   02/[id number]

## 2021-12-28 NOTE — Patient Instructions (Addendum)
Gloria Carr , Thank you for taking time to come for your Medicare Wellness Visit. I appreciate your ongoing commitment to your health goals. Please review the following plan we discussed and let me know if I can assist you in the future.   These are the goals we discussed:  Goals      Follow up with Provider     As needed        This is a list of the screening recommended for you and due dates:  Health Maintenance  Topic Date Due   COVID-19 Vaccine (8 - 2023-24 season) 01/13/2022*   Flu Shot  04/11/2022*   Medicare Annual Wellness Visit  12/29/2022   DTaP/Tdap/Td vaccine (4 - Td or Tdap) 12/10/2025   Pneumonia Vaccine  Completed   DEXA scan (bone density measurement)  Completed   Zoster (Shingles) Vaccine  Completed   HPV Vaccine  Aged Out  *Topic was postponed. The date shown is not the original due date.    Advanced directives: on file.  Next appointment: Follow up in one year for your annual wellness visit    Preventive Care 65 Years and Older, Female Preventive care refers to lifestyle choices and visits with your health care provider that can promote health and wellness. What does preventive care include? A yearly physical exam. This is also called an annual well check. Dental exams once or twice a year. Routine eye exams. Ask your health care provider how often you should have your eyes checked. Personal lifestyle choices, including: Daily care of your teeth and gums. Regular physical activity. Eating a healthy diet. Avoiding tobacco and drug use. Limiting alcohol use. Practicing safe sex. Taking low-dose aspirin every day. Taking vitamin and mineral supplements as recommended by your health care provider. What happens during an annual well check? The services and screenings done by your health care provider during your annual well check will depend on your age, overall health, lifestyle risk factors, and family history of disease. Counseling  Your health care  provider may ask you questions about your: Alcohol use. Tobacco use. Drug use. Emotional well-being. Home and relationship well-being. Sexual activity. Eating habits. History of falls. Memory and ability to understand (cognition). Work and work Statistician. Reproductive health. Screening  You may have the following tests or measurements: Height, weight, and BMI. Blood pressure. Lipid and cholesterol levels. These may be checked every 5 years, or more frequently if you are over 79 years old. Skin check. Lung cancer screening. You may have this screening every year starting at age 75 if you have a 30-pack-year history of smoking and currently smoke or have quit within the past 15 years. Fecal occult blood test (FOBT) of the stool. You may have this test every year starting at age 29. Flexible sigmoidoscopy or colonoscopy. You may have a sigmoidoscopy every 5 years or a colonoscopy every 10 years starting at age 53. Hepatitis C blood test. Hepatitis B blood test. Sexually transmitted disease (STD) testing. Diabetes screening. This is done by checking your blood sugar (glucose) after you have not eaten for a while (fasting). You may have this done every 1-3 years. Bone density scan. This is done to screen for osteoporosis. You may have this done starting at age 14. Mammogram. This may be done every 1-2 years. Talk to your health care provider about how often you should have regular mammograms. Talk with your health care provider about your test results, treatment options, and if necessary, the need for more  tests. Vaccines  Your health care provider may recommend certain vaccines, such as: Influenza vaccine. This is recommended every year. Tetanus, diphtheria, and acellular pertussis (Tdap, Td) vaccine. You may need a Td booster every 10 years. Zoster vaccine. You may need this after age 36. Pneumococcal 13-valent conjugate (PCV13) vaccine. One dose is recommended after age  73. Pneumococcal polysaccharide (PPSV23) vaccine. One dose is recommended after age 90. Talk to your health care provider about which screenings and vaccines you need and how often you need them. This information is not intended to replace advice given to you by your health care provider. Make sure you discuss any questions you have with your health care provider. Document Released: 01/24/2015 Document Revised: 09/17/2015 Document Reviewed: 10/29/2014 Elsevier Interactive Patient Education  2017 [place] Prevention in the Home Falls can cause injuries. They can happen to people of all ages. There are many things you can do to make your home safe and to help prevent falls. What can I do on the outside of my home? Regularly fix the edges of walkways and driveways and fix any cracks. Remove anything that might make you trip as you walk through a door, such as a raised step or threshold. Trim any bushes or trees on the path to your home. Use bright outdoor lighting. Clear any walking paths of anything that might make someone trip, such as rocks or tools. Regularly check to see if handrails are loose or broken. Make sure that both sides of any steps have handrails. Any raised decks and porches should have guardrails on the edges. Have any leaves, snow, or ice cleared regularly. Use sand or salt on walking paths during winter. Clean up any spills in your garage right away. This includes oil or grease spills. What can I do in the bathroom? Use night lights. Install grab bars by the toilet and in the tub and shower. Do not use towel bars as grab bars. Use non-skid mats or decals in the tub or shower. If you need to sit down in the shower, use a plastic, non-slip stool. Keep the floor dry. Clean up any water that spills on the floor as soon as it happens. Remove soap buildup in the tub or shower regularly. Attach bath mats securely with double-sided non-slip rug tape. Do not have throw  rugs and other things on the floor that can make you trip. What can I do in the bedroom? Use night lights. Make sure that you have a light by your bed that is easy to reach. Do not use any sheets or blankets that are too big for your bed. They should not hang down onto the floor. Have a firm chair that has side arms. You can use this for support while you get dressed. Do not have throw rugs and other things on the floor that can make you trip. What can I do in the kitchen? Clean up any spills right away. Avoid walking on wet floors. Keep items that you use a lot in easy-to-reach places. If you need to reach something above you, use a strong step stool that has a grab bar. Keep electrical cords out of the way. Do not use floor polish or wax that makes floors slippery. If you must use wax, use non-skid floor wax. Do not have throw rugs and other things on the floor that can make you trip. What can I do with my stairs? Do not leave any items on the stairs. Make sure  that there are handrails on both sides of the stairs and use them. Fix handrails that are broken or loose. Make sure that handrails are as long as the stairways. Check any carpeting to make sure that it is firmly attached to the stairs. Fix any carpet that is loose or worn. Avoid having throw rugs at the top or bottom of the stairs. If you do have throw rugs, attach them to the floor with carpet tape. Make sure that you have a light switch at the top of the stairs and the bottom of the stairs. If you do not have them, ask someone to add them for you. What else can I do to help prevent falls? Wear shoes that: Do not have high heels. Have rubber bottoms. Are comfortable and fit you well. Are closed at the toe. Do not wear sandals. If you use a stepladder: Make sure that it is fully opened. Do not climb a closed stepladder. Make sure that both sides of the stepladder are locked into place. Ask someone to hold it for you, if  possible. Clearly mark and make sure that you can see: Any grab bars or handrails. First and last steps. Where the edge of each step is. Use tools that help you move around (mobility aids) if they are needed. These include: Canes. Walkers. Scooters. Crutches. Turn on the lights when you go into a dark area. Replace any light bulbs as soon as they burn out. Set up your furniture so you have a clear path. Avoid moving your furniture around. If any of your floors are uneven, fix them. If there are any pets around you, be aware of where they are. Review your medicines with your doctor. Some medicines can make you feel dizzy. This can increase your chance of falling. Ask your doctor what other things that you can do to help prevent falls. This information is not intended to replace advice given to you by your health care provider. Make sure you discuss any questions you have with your health care provider. Document Released: 10/24/2008 Document Revised: 06/05/2015 Document Reviewed: 02/01/2014 Elsevier Interactive Patient Education  2017 [employer].

## 2021-12-31 ENCOUNTER — Ambulatory Visit (INDEPENDENT_AMBULATORY_CARE_PROVIDER_SITE_OTHER): Payer: PPO | Admitting: Internal Medicine

## 2021-12-31 ENCOUNTER — Encounter: Payer: Self-pay | Admitting: Internal Medicine

## 2021-12-31 VITALS — BP 146/70 | HR 70 | Temp 97.9°F | Ht 59.0 in | Wt 106.4 lb

## 2021-12-31 DIAGNOSIS — M5430 Sciatica, unspecified side: Secondary | ICD-10-CM | POA: Diagnosis not present

## 2021-12-31 DIAGNOSIS — E039 Hypothyroidism, unspecified: Secondary | ICD-10-CM

## 2021-12-31 DIAGNOSIS — R351 Nocturia: Secondary | ICD-10-CM | POA: Diagnosis not present

## 2021-12-31 DIAGNOSIS — K5904 Chronic idiopathic constipation: Secondary | ICD-10-CM

## 2021-12-31 DIAGNOSIS — I10 Essential (primary) hypertension: Secondary | ICD-10-CM | POA: Diagnosis not present

## 2021-12-31 DIAGNOSIS — M810 Age-related osteoporosis without current pathological fracture: Secondary | ICD-10-CM | POA: Diagnosis not present

## 2021-12-31 LAB — COMPREHENSIVE METABOLIC PANEL
ALT: 16 U/L (ref 0–35)
AST: 20 U/L (ref 0–37)
Albumin: 4 g/dL (ref 3.5–5.2)
Alkaline Phosphatase: 79 U/L (ref 39–117)
BUN: 16 mg/dL (ref 6–23)
CO2: 31 mEq/L (ref 19–32)
Calcium: 10 mg/dL (ref 8.4–10.5)
Chloride: 103 mEq/L (ref 96–112)
Creatinine, Ser: 0.66 mg/dL (ref 0.40–1.20)
GFR: 75.99 mL/min (ref >60.00)
Glucose, Bld: 91 mg/dL (ref 70–99)
Potassium: 3.8 mEq/L (ref 3.5–5.1)
Sodium: 141 mEq/L (ref 135–145)
Total Bilirubin: 0.8 mg/dL (ref 0.2–1.2)
Total Protein: 6.5 g/dL (ref 6.0–8.3)

## 2021-12-31 LAB — VITAMIN D 25 HYDROXY (VIT D DEFICIENCY, FRACTURES): VITD: 27.78 ng/mL — ABNORMAL LOW (ref 30.00–100.00)

## 2021-12-31 NOTE — Progress Notes (Signed)
Subjective:  Patient ID: Gloria Carr, female    DOB: November 10, 1927  Age: 88 y.o. MRN: [id number]  CC: The primary encounter diagnosis was Primary hypertension. Diagnoses of Age-related osteoporosis without current pathological fracture, Nocturia, Chronic idiopathic constipation, Acquired hypothyroidism, Sciatica, unspecified laterality, and Essential hypertension were also pertinent to this visit.   HPI Gloria Carr presents for  Chief Complaint  Patient presents with   Medical Management of Chronic Issues    Hypertension and discuss gabapentin that was prescribed to her for her feet but was taken off of it because it was not helping.    1) NEUROPATHY:  has not used the gabapentin.  Feet feel "mushy" . Has stopped the tramadol   2) spinal stenosis   : previously prescribed tramadol for pain but no longer in pain.   3) constipation: moving bowels daily . Stools are initially very hard,  the second one is softer. Not taking metamucil CT in September noted lots of stool in bowels.    4) not sleeping well due to frequent urination 3 to 4 times per night . Drinks coffee at dinner and tea  at lunch the day   5) htn :  no longer checking bp by nurse   6) mobility: uses a walker,  had a trip and fall in September due to having arms full with mail and cane.     Outpatient Medications Prior to Visit  Medication Sig Dispense Refill   amLODipine (NORVASC) 5 MG tablet TAKE 1 TABLET BY MOUTH DAILY. DOSE INCREASE. 90 tablet 1   atorvastatin (LIPITOR) 20 MG tablet Take 1 tablet (20 mg total) by mouth daily. 90 tablet 3   Boswellia-Glucosamine-Vit D (OSTEO BI-FLEX ONE PER DAY PO) Take 1 capsule by mouth daily.     Cholecalciferol (VITAMIN D3) 50 MCG (2000 UT) capsule Take 2,000 Units by mouth daily.     denosumab (PROLIA) 60 MG/ML SOSY injection Inject 60 mg into the skin every 6 (six) months.     diclofenac Sodium (VOLTAREN) 1 % GEL Apply 2 g topically 4 (four) times daily.     fluticasone (FLONASE)  50 MCG/ACT nasal spray Place into both nostrils daily. Reported on 01/17/2015     latanoprost (XALATAN) 0.005 % ophthalmic solution INSTILL 1 DROP IN THE LEFT EYE AT NIGHT  5   levothyroxine (SYNTHROID) 50 MCG tablet TAKE 1 TABLET EVERY DAY ON EMPTY STOMACHWITH A GLASS OF WATER AT LEAST 30-60 MINBEFORE BREAKFAST 90 tablet 0   Misc Natural Products (OSTEO BI-FLEX JOINT SHIELD PO) Take by mouth.     Multiple Vitamins-Minerals (CENTRUM SILVER PO) Take by mouth.     Multiple Vitamins-Minerals (PRESERVISION AREDS 2 PO) Take by mouth.     Propylene Glycol (SYSTANE BALANCE OP) Apply to eye.     No facility-administered medications prior to visit.    Review of Systems;  Patient denies headache, fevers, malaise, unintentional weight loss, skin rash, eye pain, sinus congestion and sinus pain, sore throat, dysphagia,  hemoptysis , cough, dyspnea, wheezing, chest pain, palpitations, orthopnea, edema, abdominal pain, nausea, melena, diarrhea, constipation, flank pain, dysuria, hematuria, urinary  Frequency, nocturia, numbness, tingling, seizures,  Focal weakness, Loss of consciousness,  Tremor, insomnia, depression, anxiety, and suicidal ideation.      Objective:  BP (!) 146/70   Pulse 70   Temp 97.9 F (36.6 C) (Oral)   Ht '4\' 11"'$  (1.499 m)   Wt 106 lb 6.4 oz (48.3 kg)   LMP  (LMP Unknown)  SpO2 97%   BMI 21.49 kg/m   BP Readings from Last 3 Encounters:  12/31/21 (!) 146/70  10/09/21 (!) 140/76  09/08/21 (!) 160/78    Wt Readings from Last 3 Encounters:  12/31/21 106 lb 6.4 oz (48.3 kg)  12/28/21 106 lb (48.1 kg)  10/09/21 106 lb 9.6 oz (48.4 kg)    Physical Exam Vitals reviewed.  Constitutional:      General: She is not in acute distress.    Appearance: Normal appearance. She is normal weight. She is not ill-appearing, toxic-appearing or diaphoretic.  HENT:     Head: Normocephalic.  Eyes:     General: No scleral icterus.       Right eye: No discharge.        Left eye: No  discharge.     Conjunctiva/sclera: Conjunctivae normal.  Cardiovascular:     Rate and Rhythm: Normal rate and regular rhythm.     Heart sounds: Normal heart sounds.  Pulmonary:     Effort: Pulmonary effort is normal. No respiratory distress.     Breath sounds: Normal breath sounds.  Musculoskeletal:        General: Normal range of motion.  Skin:    General: Skin is warm and dry.  Neurological:     General: No focal deficit present.     Mental Status: She is alert and oriented to person, place, and time. Mental status is at baseline.  Psychiatric:        Mood and Affect: Mood normal.        Behavior: Behavior normal.        Thought Content: Thought content normal.        Judgment: Judgment normal.     Lab Results  Component Value Date   HGBA1C 5.9 06/29/2021   HGBA1C 6.1 01/16/2021   HGBA1C 6.0 07/16/2020    Lab Results  Component Value Date   CREATININE 0.66 12/31/2021   CREATININE 0.80 09/16/2021   CREATININE 0.78 06/29/2021    Lab Results  Component Value Date   WBC 7.3 06/29/2021   HGB 14.5 06/29/2021   HCT 44.5 06/29/2021   PLT 257.0 06/29/2021   GLUCOSE 91 12/31/2021   CHOL 180 06/29/2021   TRIG 71.0 06/29/2021   HDL 89.00 06/29/2021   LDLCALC 77 06/29/2021   ALT 16 12/31/2021   AST 20 12/31/2021   NA 141 12/31/2021   K 3.8 12/31/2021   CL 103 12/31/2021   CREATININE 0.66 12/31/2021   BUN 16 12/31/2021   CO2 31 12/31/2021   TSH 2.51 06/29/2021   HGBA1C 5.9 06/29/2021   MICROALBUR 4.9 (H) 01/16/2021    CT ABDOMEN PELVIS W CONTRAST  Result Date: 09/17/2021 CLINICAL DATA:  Abdominal distension and bloating. Constipation. Evaluate ovarian pathology. EXAM: CT ABDOMEN AND PELVIS WITH CONTRAST TECHNIQUE: Multidetector CT imaging of the abdomen and pelvis was performed using the standard protocol following bolus administration of intravenous contrast. RADIATION DOSE REDUCTION: This exam was performed according to the departmental dose-optimization program  which includes automated exposure control, adjustment of the mA and/or kV according to patient size and/or use of iterative reconstruction technique. CONTRAST:  69m OMNIPAQUE IOHEXOL 300 MG/ML  SOLN COMPARISON:  February 21 2018 FINDINGS: Lower chest: No acute abnormality. Hepatobiliary: Nodular contour of the liver is identified unchanged. Probable fatty infiltration of the liver with areas of sparing are stable compared prior exam. The gallbladder and biliary tree are normal. Pancreas: Pancreatic duct prominence is unchanged compared prior exam. The pancreas is  otherwise unremarkable. Spleen: Normal in size without focal abnormality. Adrenals/Urinary Tract: The right adrenal gland is normal. Atrophy of left adrenal gland is unchanged. Small focal areas of scar are identified in both kidneys. Bilateral parapelvic renal cysts are identified unchanged. No follow-up is necessary. No hydronephrosis bilaterally. The bladder is normal. Stomach/Bowel: Small hiatal hernia is identified. The stomach is otherwise normal. There is no small bowel obstruction. Diverticulosis of the colon without diverticulitis noted. Moderate bowel content is identified throughout colon. The appendix is normal. Vascular/Lymphatic: Aortic atherosclerosis. No enlarged abdominal or pelvic lymph nodes. Reproductive: Uterus and bilateral adnexa are unremarkable. No abnormal masses are identified in bilateral adnexa. Other: None. Musculoskeletal: Degenerative joint changes of the spine are noted. IMPRESSION: 1. No acute abnormality identified in the abdomen and pelvis. 2. No abnormal masses are identified in bilateral adnexa. 3. Moderate bowel content is identified throughout colon. This can be seen in constipation. Aortic Atherosclerosis (ICD10-I70.0). Electronically Signed   By: Abelardo Diesel M.D.   On: 09/17/2021 14:32    Assessment & Plan:  .Primary hypertension -     Comprehensive metabolic panel  Age-related osteoporosis without current  pathological fracture -     VITAMIN D 25 Hydroxy (Vit-D Deficiency, Fractures)  Nocturia Assessment & Plan: Aggravated by use of caffeine in the evening.  Dietary changes recommended.    Chronic idiopathic constipation Assessment & Plan: Advied (again) to take metamcil daily    Acquired hypothyroidism Assessment & Plan: Thyroid function is WNL on current dose.  No current changes needed.   Lab Results  Component Value Date   TSH 2.51 06/29/2021      Sciatica, unspecified laterality Assessment & Plan: S/p PT referral , which she deferred after several visits .     Essential hypertension Assessment & Plan: Well controlled on current regimen of amlodipine 5 mg daily Renal function stable, no changes today.  Lab Results  Component Value Date   CREATININE 0.66 12/31/2021   Lab Results  Component Value Date   NA 141 12/31/2021   K 3.8 12/31/2021   CL 103 12/31/2021   CO2 31 12/31/2021         I provided 30 minutes of face-to-face time during this encounter reviewing patient's last visit with me, patient's  most recent visit with cardiology,  nephrology,  and neurology,  recent surgical and non surgical procedures, previous  labs and imaging studies, counseling on currently addressed issues,  and post visit ordering to diagnostics and therapeutics .   Follow-up: Return in about 6 months (around 07/02/2022).   Crecencio Mc, MD

## 2021-12-31 NOTE — Telephone Encounter (Signed)
I spoke with patient in the office today & also recheck a calcium & vit d.Pt states that she usually only has to pay about $90 for her Prolia. So I will send request to [facility] for billing history & notify her when I have received it.

## 2021-12-31 NOTE — Patient Instructions (Addendum)
You need to take the metamucil NOT MIRALAX daily   Bulk  forming laxatives   Ok to use daily  Citrucel, benefiber, metamucil, Fibercon, )  You do not need tramadol or gabapentin   Advil /aleve  will not reduce your nighttime urination  You should not drink caffeine after 12 NOON.  YOUR DINNERTIME COFFEE IS MAKING YOU PEE MORE AT NIGHT  Switch to decaf tea for lunch   USE THE MINIMUM AMOUNT OF WATER TO TAKE YOUR NIGHTTIME PILLS   Consider getting the RSV vaccine

## 2022-01-02 DIAGNOSIS — K5900 Constipation, unspecified: Secondary | ICD-10-CM | POA: Insufficient documentation

## 2022-01-02 NOTE — Assessment & Plan Note (Signed)
Well controlled on current regimen of amlodipine 5 mg daily Renal function stable, no changes today.  Lab Results  Component Value Date   CREATININE 0.66 12/31/2021   Lab Results  Component Value Date   NA 141 12/31/2021   K 3.8 12/31/2021   CL 103 12/31/2021   CO2 31 12/31/2021

## 2022-01-02 NOTE — Assessment & Plan Note (Signed)
Thyroid function is WNL on current dose.  No current changes needed.   Lab Results  Component Value Date   TSH 2.51 06/29/2021

## 2022-01-02 NOTE — Assessment & Plan Note (Signed)
Advied (again) to take metamcil daily

## 2022-01-02 NOTE — Assessment & Plan Note (Signed)
Aggravated by use of caffeine in the evening.  Dietary changes recommended.

## 2022-01-02 NOTE — Assessment & Plan Note (Signed)
S/p PT referral , which she deferred after several visits .

## 2022-01-30 NOTE — Telephone Encounter (Signed)
Based on billings, pt has been paying $276.22 for Prolia.  Sent pt a mychart message to notify her She is overdue for Prolia.  Summary  Visit 68, Tx 450  Patient: Gloria Carr [[id number]]  Department: [facility]  Location: [facility]  Service date: 04/23/2021  Provider: Crecencio Mc, MD  Billing provider: Crecencio Mc, MD  Zara Chess NPI: [id number]  Diagnoses: 1) M81.0 - Age-related osteoporosis without current pathological fracture [Active]  Modifier: None  Quantity: 60  Source: [facility] Ambulatory  Post date: 04/23/2021   Statement Intervention   Status: Excluded from Statement Processing  Rule: [place]    Balance: 0.00  Original amount: 2,400.00  Aging: 04/23/2021 (258 days)  Claim: N/A     Self-Pay    Balance: 0.00  Original amount: 0.00  Aging: 05/19/2021 (232 days)  Statement: 05/19/2021      Matching Transactions   # Dep Date Posted Description Match Amt  451 05/18/21 05/19/21 2000 - INSURANCE PAYMENT  (INSURANCE) - [employer] -1,082.76  452 05/18/21 05/19/21 3000 - CONTRACTUAL WRITE-OFF (INSURANCE) -1,018.92  453 05/18/21 05/19/21 5128 - SEQUESTRATION ADJUSTMENT -22.10  454 06/09/21 06/09/21 1000 - PATIENT PAYMENT (ACCOUNT) -276.22   Invoices   [employer] - [employer] PPO   Number Status Accepted Mod  [id number] Closed 04/27/2021 None     Coverages    [employer] - [employer] PPO         Self-Pay   Edit Visit Coverages Coverage Change History    Referral   Referral Source : Crecencio Mc   Service Level Authorization   None  Edit Authorization   Charge Adjudication   Total: 0.00  Benefit package: MOS BILL ALL TO CARRIER  Component: ALL PROCEDURES (PX)  Adjudication table: TABLE 691 (NO PATIENT *;NO PATIENT *)  Adjudication formula: NO PATIENT PMT   Self-Pay Breakdown   Initial Self-Pay Due  276.22  Payments  -276.22  Current Due  0.00

## 2022-02-05 ENCOUNTER — Other Ambulatory Visit: Payer: Self-pay | Admitting: Internal Medicine

## 2022-02-09 ENCOUNTER — Encounter: Payer: Self-pay | Admitting: *Deleted

## 2022-02-18 ENCOUNTER — Ambulatory Visit: Payer: PPO

## 2022-02-18 DIAGNOSIS — M810 Age-related osteoporosis without current pathological fracture: Secondary | ICD-10-CM

## 2022-02-18 MED ORDER — DENOSUMAB 60 MG/ML ~~LOC~~ SOSY
60.0000 mg | PREFILLED_SYRINGE | Freq: Once | SUBCUTANEOUS | Status: AC
  Administered 2022-02-18: 60 mg via SUBCUTANEOUS

## 2022-02-18 NOTE — Progress Notes (Signed)
Pt presented today for Prolia injection. Left arm, SQ. Pt voiced no concerns nor showed any signs of distress during injection.

## 2022-03-08 DIAGNOSIS — Z08 Encounter for follow-up examination after completed treatment for malignant neoplasm: Secondary | ICD-10-CM | POA: Diagnosis not present

## 2022-03-08 DIAGNOSIS — D2271 Melanocytic nevi of right lower limb, including hip: Secondary | ICD-10-CM | POA: Diagnosis not present

## 2022-03-08 DIAGNOSIS — L821 Other seborrheic keratosis: Secondary | ICD-10-CM | POA: Diagnosis not present

## 2022-03-08 DIAGNOSIS — D2262 Melanocytic nevi of left upper limb, including shoulder: Secondary | ICD-10-CM | POA: Diagnosis not present

## 2022-03-08 DIAGNOSIS — D2272 Melanocytic nevi of left lower limb, including hip: Secondary | ICD-10-CM | POA: Diagnosis not present

## 2022-03-08 DIAGNOSIS — B078 Other viral warts: Secondary | ICD-10-CM | POA: Diagnosis not present

## 2022-03-08 DIAGNOSIS — Z85828 Personal history of other malignant neoplasm of skin: Secondary | ICD-10-CM | POA: Diagnosis not present

## 2022-03-08 DIAGNOSIS — D485 Neoplasm of uncertain behavior of skin: Secondary | ICD-10-CM | POA: Diagnosis not present

## 2022-03-08 DIAGNOSIS — D2261 Melanocytic nevi of right upper limb, including shoulder: Secondary | ICD-10-CM | POA: Diagnosis not present

## 2022-03-08 DIAGNOSIS — D225 Melanocytic nevi of trunk: Secondary | ICD-10-CM | POA: Diagnosis not present

## 2022-03-22 ENCOUNTER — Telehealth: Payer: Self-pay

## 2022-03-22 ENCOUNTER — Other Ambulatory Visit: Payer: Self-pay

## 2022-03-22 MED ORDER — LEVOTHYROXINE SODIUM 50 MCG PO TABS: ORAL_TABLET | ORAL | 3 refills | Status: DC

## 2022-03-22 NOTE — Telephone Encounter (Signed)
sent 

## 2022-03-22 NOTE — Telephone Encounter (Signed)
Prescription Request  03/22/2022  LOV: Visit date not found  What is the name of the medication or equipment? levothyroxine (SYNTHROID) 50 MCG tablet  Have you contacted your pharmacy to request a refill? Yes   Which pharmacy would you like this sent to?  [facility] - [place], [place] - [address] [place] [address] Phone: [phone] Fax: [phone]    Patient notified that their request is being sent to the clinical staff for review and that they should receive a response within 2 business days.   Please advise at Mobile There is no such number on file (mobile).  Patient states she has a couple of days of this medication and she originally requested the refill last Friday.

## 2022-03-26 ENCOUNTER — Ambulatory Visit (INDEPENDENT_AMBULATORY_CARE_PROVIDER_SITE_OTHER): Payer: PPO | Admitting: Internal Medicine

## 2022-03-26 ENCOUNTER — Encounter: Payer: Self-pay | Admitting: Internal Medicine

## 2022-03-26 VITALS — BP 146/66 | HR 71 | Temp 98.0°F | Ht 59.0 in | Wt 103.8 lb

## 2022-03-26 DIAGNOSIS — R7303 Prediabetes: Secondary | ICD-10-CM

## 2022-03-26 DIAGNOSIS — I10 Essential (primary) hypertension: Secondary | ICD-10-CM

## 2022-03-26 DIAGNOSIS — M810 Age-related osteoporosis without current pathological fracture: Secondary | ICD-10-CM | POA: Diagnosis not present

## 2022-03-26 DIAGNOSIS — E039 Hypothyroidism, unspecified: Secondary | ICD-10-CM | POA: Diagnosis not present

## 2022-03-26 DIAGNOSIS — I739 Peripheral vascular disease, unspecified: Secondary | ICD-10-CM | POA: Diagnosis not present

## 2022-03-26 DIAGNOSIS — Z9181 History of falling: Secondary | ICD-10-CM | POA: Diagnosis not present

## 2022-03-26 DIAGNOSIS — E7800 Pure hypercholesterolemia, unspecified: Secondary | ICD-10-CM

## 2022-03-26 LAB — LIPID PANEL
Cholesterol: 155 mg/dL (ref 0–200)
HDL: 82.7 mg/dL (ref >39.00)
LDL Cholesterol: 60 mg/dL (ref 0–99)
NonHDL: 72.3
Total CHOL/HDL Ratio: 2
Triglycerides: 64 mg/dL (ref 0.0–149.0)
VLDL: 12.8 mg/dL (ref 0.0–40.0)

## 2022-03-26 LAB — MICROALBUMIN / CREATININE URINE RATIO
Creatinine,U: 51.2 mg/dL
Microalb Creat Ratio: 2.1 mg/g (ref 0.0–30.0)
Microalb, Ur: 1.1 mg/dL (ref 0.0–1.9)

## 2022-03-26 LAB — COMPREHENSIVE METABOLIC PANEL
ALT: 19 U/L (ref 0–35)
AST: 22 U/L (ref 0–37)
Albumin: 3.9 g/dL (ref 3.5–5.2)
Alkaline Phosphatase: 81 U/L (ref 39–117)
BUN: 17 mg/dL (ref 6–23)
CO2: 29 mEq/L (ref 19–32)
Calcium: 9.2 mg/dL (ref 8.4–10.5)
Chloride: 103 mEq/L (ref 96–112)
Creatinine, Ser: 0.68 mg/dL (ref 0.40–1.20)
GFR: 75.32 mL/min (ref >60.00)
Glucose, Bld: 102 mg/dL — ABNORMAL HIGH (ref 70–99)
Potassium: 3.8 mEq/L (ref 3.5–5.1)
Sodium: 139 mEq/L (ref 135–145)
Total Bilirubin: 0.9 mg/dL (ref 0.2–1.2)
Total Protein: 7 g/dL (ref 6.0–8.3)

## 2022-03-26 LAB — HEMOGLOBIN A1C: Hgb A1c MFr Bld: 5.9 % (ref 4.6–6.5)

## 2022-03-26 LAB — TSH: TSH: 1.4 u[IU]/mL (ref 0.35–5.50)

## 2022-03-26 LAB — LDL CHOLESTEROL, DIRECT: Direct LDL: 52 mg/dL

## 2022-03-26 NOTE — Assessment & Plan Note (Signed)
No changes to regimen today given age and prevalance of readings < 140/90

## 2022-03-26 NOTE — Patient Instructions (Addendum)
Your circulation was slightly lower in your legs than in your arm.  But the change is not severe Continue walking daily,  taking your atorvastatin   I recommend we you repeat the test  next year  Limiting your salt  as you are doing will help prevent the blood pressure from going up   You are doing well!  You bruised your tailbone ; it may hurt for a few weeks, so use a "donut ring" cushion or a gel seat until it feels better

## 2022-03-26 NOTE — Assessment & Plan Note (Signed)
Mild, by ABI's done recently at screening far 0.78 and 0.86  .  Asymptomatic . Repeat next year   continue statin

## 2022-03-26 NOTE — Progress Notes (Unsigned)
Subjective:  Patient ID: Gloria Carr, female    DOB: 06-10-1927  Age: 89 y.o. MRN: [id number]  CC: The primary encounter diagnosis was Essential hypertension. Diagnoses of Pure hypercholesterolemia, Acquired hypothyroidism, and Prediabetes were also pertinent to this visit.   HPI Gloria Carr presents for  Chief Complaint  Patient presents with   Medical Management of Chronic Issues   1) HTN:   Hypertension: patient checks blood pressure twice weekly at home.  Readings have been for the most part > 140/80 at rest . Patient is following a reduce salt diet most days and is taking medications as prescribed    [place] .  Went to sit down , the chair rolled back and she landined on her butt.  Tailbone  is sore .  No bruises on exa,  2) peripheral neuropathy  foot feels wet , hot and cold   3) ABIs;  done and  0.78 on  left and 0.86 on right .  Denies claudication symptoms  4)     Outpatient Medications Prior to Visit  Medication Sig Dispense Refill   amLODipine (NORVASC) 5 MG tablet TAKE 1 TABLET BY MOUTH DAILY. DOSE INCREASE. 90 tablet 1   atorvastatin (LIPITOR) 20 MG tablet Take 1 tablet (20 mg total) by mouth daily. 90 tablet 3   Boswellia-Glucosamine-Vit D (OSTEO BI-FLEX ONE PER DAY PO) Take 1 capsule by mouth daily.     Cholecalciferol (VITAMIN D3) 50 MCG (2000 UT) capsule Take 2,000 Units by mouth daily.     denosumab (PROLIA) 60 MG/ML SOSY injection Inject 60 mg into the skin every 6 (six) months.     diclofenac Sodium (VOLTAREN) 1 % GEL Apply 2 g topically 4 (four) times daily.     fluticasone (FLONASE) 50 MCG/ACT nasal spray Place into both nostrils daily. Reported on 01/17/2015     latanoprost (XALATAN) 0.005 % ophthalmic solution INSTILL 1 DROP IN THE LEFT EYE AT NIGHT  5   levothyroxine (SYNTHROID) 50 MCG tablet TAKE 1 TABLET EVERY DAY ON EMPTY STOMACHWITH A GLASS OF WATER AT LEAST 30-60 MINBEFORE BREAKFAST 90 tablet 3   Misc  Natural Products (OSTEO BI-FLEX JOINT SHIELD PO) Take by mouth.     Multiple Vitamins-Minerals (CENTRUM SILVER PO) Take by mouth.     Multiple Vitamins-Minerals (PRESERVISION AREDS 2 PO) Take by mouth.     Propylene Glycol (SYSTANE BALANCE OP) Apply to eye.     No facility-administered medications prior to visit.    Review of Systems;  Patient denies headache, fevers, malaise, unintentional weight loss, skin rash, eye pain, sinus congestion and sinus pain, sore throat, dysphagia,  hemoptysis , cough, dyspnea, wheezing, chest pain, palpitations, orthopnea, edema, abdominal pain, nausea, melena, diarrhea, constipation, flank pain, dysuria, hematuria, urinary  Frequency, nocturia, numbness, tingling, seizures,  Focal weakness, Loss of consciousness,  Tremor, insomnia, depression, anxiety, and suicidal ideation.      Objective:  BP (!) 146/66   Pulse 71   Temp 98 F (36.7 C) (Oral)   Ht 4\' 11"  (1.499 m)   Wt 103 lb 12.8 oz (47.1 kg)   LMP  (LMP Unknown)   SpO2 95%   BMI 20.97 kg/m   BP Readings from Last 3 Encounters:  03/26/22 (!) 146/66  12/31/21 (!) 146/70  10/09/21 (!) 140/76    Wt Readings from Last 3 Encounters:  03/26/22 103 lb 12.8 oz (47.1 kg)  12/31/21 106 lb 6.4 oz (48.3 kg)  12/28/21  106 lb (48.1 kg)    Physical Exam  Lab Results  Component Value Date   HGBA1C 5.9 06/29/2021   HGBA1C 6.1 01/16/2021   HGBA1C 6.0 07/16/2020    Lab Results  Component Value Date   CREATININE 0.66 12/31/2021   CREATININE 0.80 09/16/2021   CREATININE 0.78 06/29/2021    Lab Results  Component Value Date   WBC 7.3 06/29/2021   HGB 14.5 06/29/2021   HCT 44.5 06/29/2021   PLT 257.0 06/29/2021   GLUCOSE 91 12/31/2021   CHOL 180 06/29/2021   TRIG 71.0 06/29/2021   HDL 89.00 06/29/2021   LDLCALC 77 06/29/2021   ALT 16 12/31/2021   AST 20 12/31/2021   NA 141 12/31/2021   K 3.8 12/31/2021   CL 103 12/31/2021   CREATININE 0.66 12/31/2021   BUN 16 12/31/2021   CO2 31  12/31/2021   TSH 2.51 06/29/2021   HGBA1C 5.9 06/29/2021   MICROALBUR 4.9 (H) 01/16/2021    CT ABDOMEN PELVIS W CONTRAST  Result Date: 09/17/2021 CLINICAL DATA:  Abdominal distension and bloating. Constipation. Evaluate ovarian pathology. EXAM: CT ABDOMEN AND PELVIS WITH CONTRAST TECHNIQUE: Multidetector CT imaging of the abdomen and pelvis was performed using the standard protocol following bolus administration of intravenous contrast. RADIATION DOSE REDUCTION: This exam was performed according to the departmental dose-optimization program which includes automated exposure control, adjustment of the mA and/or kV according to patient size and/or use of iterative reconstruction technique. CONTRAST:  38mL OMNIPAQUE IOHEXOL 300 MG/ML  SOLN COMPARISON:  February 21 2018 FINDINGS: Lower chest: No acute abnormality. Hepatobiliary: Nodular contour of the liver is identified unchanged. Probable fatty infiltration of the liver with areas of sparing are stable compared prior exam. The gallbladder and biliary tree are normal. Pancreas: Pancreatic duct prominence is unchanged compared prior exam. The pancreas is otherwise unremarkable. Spleen: Normal in size without focal abnormality. Adrenals/Urinary Tract: The right adrenal gland is normal. Atrophy of left adrenal gland is unchanged. Small focal areas of scar are identified in both kidneys. Bilateral parapelvic renal cysts are identified unchanged. No follow-up is necessary. No hydronephrosis bilaterally. The bladder is normal. Stomach/Bowel: Small hiatal hernia is identified. The stomach is otherwise normal. There is no small bowel obstruction. Diverticulosis of the colon without diverticulitis noted. Moderate bowel content is identified throughout colon. The appendix is normal. Vascular/Lymphatic: Aortic atherosclerosis. No enlarged abdominal or pelvic lymph nodes. Reproductive: Uterus and bilateral adnexa are unremarkable. No abnormal masses are identified in  bilateral adnexa. Other: None. Musculoskeletal: Degenerative joint changes of the spine are noted. IMPRESSION: 1. No acute abnormality identified in the abdomen and pelvis. 2. No abnormal masses are identified in bilateral adnexa. 3. Moderate bowel content is identified throughout colon. This can be seen in constipation. Aortic Atherosclerosis (ICD10-I70.0). Electronically Signed   By: Abelardo Diesel M.D.   On: 09/17/2021 14:32    Assessment & Plan:  .Essential hypertension -     Comprehensive metabolic panel -     Microalbumin / creatinine urine ratio  Pure hypercholesterolemia -     Lipid panel -     LDL cholesterol, direct  Acquired hypothyroidism -     TSH  Prediabetes -     Comprehensive metabolic panel -     Hemoglobin A1c     I provided 30 minutes of face-to-face time during this encounter reviewing patient's last visit with me, patient's  most recent visit with cardiology,  nephrology,  and neurology,  recent surgical and non surgical procedures, previous  labs and imaging studies, counseling on currently addressed issues,  and post visit ordering to diagnostics and therapeutics .   Follow-up: No follow-ups on file.   Crecencio Mc, MD

## 2022-03-26 NOTE — Assessment & Plan Note (Signed)
Occurred on March 14 when the chair behind her rolled backward as she sat down.. tailbone pain reported; no bruising on exam.  ROM of hips normal recommend gel set or inflatable ring. ,  tylenol

## 2022-03-28 NOTE — Assessment & Plan Note (Signed)
She is following a low glycemic index diet and particpate regularly in an aerobic  exercise activity and has lowered her A1c  Lab Results  Component Value Date   HGBA1C 5.9 03/26/2022

## 2022-03-28 NOTE — Assessment & Plan Note (Signed)
Managed with atorvastatin for concurrent aortic atherosclerosis and PAD  Lab Results  Component Value Date   CHOL 155 03/26/2022   HDL 82.70 03/26/2022   LDLCALC 60 03/26/2022   LDLDIRECT 52.0 03/26/2022   TRIG 64.0 03/26/2022   CHOLHDL 2 03/26/2022

## 2022-03-28 NOTE — Assessment & Plan Note (Signed)
With history of pelvic fractures. Continue Prolia every 6 months.  Reviewed calcium and vitamin D supplemementation. Reviewed  Last DEXA in Oct 2022 which  noted improvement I n BMD  °

## 2022-03-31 ENCOUNTER — Telehealth: Payer: Self-pay | Admitting: Internal Medicine

## 2022-03-31 NOTE — Telephone Encounter (Signed)
Tested positive for covid this morning, symptoms started Tuesday afternoon with runny nose and sneezing. Current symptoms are runny nose, sneezing, body aches, chills, slight shortness of breath, sore throat, no cough. Has taken aleve for the aches and pains. Order nasal spray from the pharmacy but it has not been delivered yet.

## 2022-03-31 NOTE — Telephone Encounter (Signed)
Pt called stating she tested positive for covid today and would like something called in

## 2022-03-31 NOTE — Telephone Encounter (Signed)
Pt is aware and gave a verbal understanding.  

## 2022-04-05 ENCOUNTER — Ambulatory Visit (INDEPENDENT_AMBULATORY_CARE_PROVIDER_SITE_OTHER): Payer: PPO | Admitting: Internal Medicine

## 2022-04-05 ENCOUNTER — Encounter: Payer: Self-pay | Admitting: Internal Medicine

## 2022-04-05 VITALS — BP 104/64 | HR 82 | Temp 98.2°F | Resp 16 | Ht 59.0 in | Wt 102.8 lb

## 2022-04-05 DIAGNOSIS — U071 COVID-19: Secondary | ICD-10-CM

## 2022-04-05 DIAGNOSIS — R110 Nausea: Secondary | ICD-10-CM

## 2022-04-05 HISTORY — DX: COVID-19: U07.1

## 2022-04-05 LAB — URINALYSIS, ROUTINE W REFLEX MICROSCOPIC
Bilirubin Urine: NEGATIVE
Ketones, ur: NEGATIVE
Nitrite: POSITIVE — AB
RBC / HPF: NONE SEEN (ref 0–?)
Specific Gravity, Urine: <=1.005 — AB (ref 1.000–1.030)
Total Protein, Urine: NEGATIVE
Urine Glucose: NEGATIVE
Urobilinogen, UA: 0.2 (ref 0.0–1.0)
pH: 6 (ref 5.0–8.0)

## 2022-04-05 MED ORDER — ONDANSETRON HCL 4 MG PO TABS: 4.0000 mg | ORAL_TABLET | Freq: Three times a day (TID) | ORAL | 0 refills | Status: DC | PRN

## 2022-04-05 NOTE — Assessment & Plan Note (Signed)
She tested positive on March 20.  Symptoms are nonspecific and limited to fatigue, malaise,  mild nausea ,  anorexia and joint pain .  Supportive care outlined

## 2022-04-05 NOTE — Patient Instructions (Addendum)
I'm sorry you are feeling so poorly.  However,  your symptoms are not life threatening and may last a few more weeks .   Staying hydrated  and using the nausea medication before you sit down for a meal is important.   You can combine   tylenol with ibupto 2000 mg of acetominophen  in divided doses  with ibuprofen is needed for joint pain.

## 2022-04-05 NOTE — Progress Notes (Unsigned)
Subjective:  Patient ID: Gloria Carr, female    DOB: Dec 30, 1927  Age: 89 y.o. MRN: [id number]  CC: There were no encounter diagnoses.   HPI Misako Osterkamp presents for  Chief Complaint  Patient presents with   Nausea   Fatigue   6 day history of fatigue, dry mouth, nausea and decreased appetite .  S  Patient was seen  on March 15  and was feeling in good health at that time. No medication changes were made.   However, she tested positive for COVID on  March 20 .    And has been feeling lousy since then.  No energy,  no appetite,    getting dry mouth from the OTC medications.     Outpatient Medications Prior to Visit  Medication Sig Dispense Refill   amLODipine (NORVASC) 5 MG tablet TAKE 1 TABLET BY MOUTH DAILY. DOSE INCREASE. 90 tablet 1   atorvastatin (LIPITOR) 20 MG tablet Take 1 tablet (20 mg total) by mouth daily. 90 tablet 3   Boswellia-Glucosamine-Vit D (OSTEO BI-FLEX ONE PER DAY PO) Take 1 capsule by mouth daily.     Cholecalciferol (VITAMIN D3) 50 MCG (2000 UT) capsule Take 2,000 Units by mouth daily.     denosumab (PROLIA) 60 MG/ML SOSY injection Inject 60 mg into the skin every 6 (six) months.     diclofenac Sodium (VOLTAREN) 1 % GEL Apply 2 g topically 4 (four) times daily.     fluticasone (FLONASE) 50 MCG/ACT nasal spray Place into both nostrils daily. Reported on 01/17/2015     latanoprost (XALATAN) 0.005 % ophthalmic solution INSTILL 1 DROP IN THE LEFT EYE AT NIGHT  5   levothyroxine (SYNTHROID) 50 MCG tablet TAKE 1 TABLET EVERY DAY ON EMPTY STOMACHWITH A GLASS OF WATER AT LEAST 30-60 MINBEFORE BREAKFAST 90 tablet 3   Misc Natural Products (OSTEO BI-FLEX JOINT SHIELD PO) Take by mouth.     Multiple Vitamins-Minerals (CENTRUM SILVER PO) Take by mouth.     Multiple Vitamins-Minerals (PRESERVISION AREDS 2 PO) Take by mouth.     Propylene Glycol (SYSTANE BALANCE OP) Apply to eye.     No facility-administered medications prior to visit.    Review of Systems;  Patient denies  headache, fevers, malaise, unintentional weight loss, skin rash, eye pain, sinus congestion and sinus pain, sore throat, dysphagia,  hemoptysis , cough, dyspnea, wheezing, chest pain, palpitations, orthopnea, edema, abdominal pain, nausea, melena, diarrhea, constipation, flank pain, dysuria, hematuria, urinary  Frequency, nocturia, numbness, tingling, seizures,  Focal weakness, Loss of consciousness,  Tremor, insomnia, depression, anxiety, and suicidal ideation.      Objective:  BP 104/64   Pulse 82   Temp 98.2 F (36.8 C)   Resp 16   Ht 4\' 11"  (1.499 m)   Wt 102 lb 12.8 oz (46.6 kg)   LMP  (LMP Unknown)   SpO2 98%   BMI 20.76 kg/m   BP Readings from Last 3 Encounters:  04/05/22 104/64  03/26/22 (!) 146/66  12/31/21 (!) 146/70    Wt Readings from Last 3 Encounters:  04/05/22 102 lb 12.8 oz (46.6 kg)  03/26/22 103 lb 12.8 oz (47.1 kg)  12/31/21 106 lb 6.4 oz (48.3 kg)    Physical Exam  Lab Results  Component Value Date   HGBA1C 5.9 03/26/2022   HGBA1C 5.9 06/29/2021   HGBA1C 6.1 01/16/2021    Lab Results  Component Value Date   CREATININE 0.68 03/26/2022   CREATININE 0.66 12/31/2021   CREATININE 0.80  09/16/2021    Lab Results  Component Value Date   WBC 7.3 06/29/2021   HGB 14.5 06/29/2021   HCT 44.5 06/29/2021   PLT 257.0 06/29/2021   GLUCOSE 102 (H) 03/26/2022   CHOL 155 03/26/2022   TRIG 64.0 03/26/2022   HDL 82.70 03/26/2022   LDLDIRECT 52.0 03/26/2022   LDLCALC 60 03/26/2022   ALT 19 03/26/2022   AST 22 03/26/2022   NA 139 03/26/2022   K 3.8 03/26/2022   CL 103 03/26/2022   CREATININE 0.68 03/26/2022   BUN 17 03/26/2022   CO2 29 03/26/2022   TSH 1.40 03/26/2022   HGBA1C 5.9 03/26/2022   MICROALBUR 1.1 03/26/2022    CT ABDOMEN PELVIS W CONTRAST  Result Date: 09/17/2021 CLINICAL DATA:  Abdominal distension and bloating. Constipation. Evaluate ovarian pathology. EXAM: CT ABDOMEN AND PELVIS WITH CONTRAST TECHNIQUE: Multidetector CT imaging of the  abdomen and pelvis was performed using the standard protocol following bolus administration of intravenous contrast. RADIATION DOSE REDUCTION: This exam was performed according to the departmental dose-optimization program which includes automated exposure control, adjustment of the mA and/or kV according to patient size and/or use of iterative reconstruction technique. CONTRAST:  56mL OMNIPAQUE IOHEXOL 300 MG/ML  SOLN COMPARISON:  February 21 2018 FINDINGS: Lower chest: No acute abnormality. Hepatobiliary: Nodular contour of the liver is identified unchanged. Probable fatty infiltration of the liver with areas of sparing are stable compared prior exam. The gallbladder and biliary tree are normal. Pancreas: Pancreatic duct prominence is unchanged compared prior exam. The pancreas is otherwise unremarkable. Spleen: Normal in size without focal abnormality. Adrenals/Urinary Tract: The right adrenal gland is normal. Atrophy of left adrenal gland is unchanged. Small focal areas of scar are identified in both kidneys. Bilateral parapelvic renal cysts are identified unchanged. No follow-up is necessary. No hydronephrosis bilaterally. The bladder is normal. Stomach/Bowel: Small hiatal hernia is identified. The stomach is otherwise normal. There is no small bowel obstruction. Diverticulosis of the colon without diverticulitis noted. Moderate bowel content is identified throughout colon. The appendix is normal. Vascular/Lymphatic: Aortic atherosclerosis. No enlarged abdominal or pelvic lymph nodes. Reproductive: Uterus and bilateral adnexa are unremarkable. No abnormal masses are identified in bilateral adnexa. Other: None. Musculoskeletal: Degenerative joint changes of the spine are noted. IMPRESSION: 1. No acute abnormality identified in the abdomen and pelvis. 2. No abnormal masses are identified in bilateral adnexa. 3. Moderate bowel content is identified throughout colon. This can be seen in constipation. Aortic  Atherosclerosis (ICD10-I70.0). Electronically Signed   By: Abelardo Diesel M.D.   On: 09/17/2021 14:32    Assessment & Plan:  .There are no diagnoses linked to this encounter.   I provided 30 minutes of face-to-face time during this encounter reviewing patient's last visit with me, patient's  most recent visit with cardiology,  nephrology,  and neurology,  recent surgical and non surgical procedures, previous  labs and imaging studies, counseling on currently addressed issues,  and post visit ordering to diagnostics and therapeutics .   Follow-up: No follow-ups on file.   Crecencio Mc, MD

## 2022-04-08 ENCOUNTER — Other Ambulatory Visit: Payer: Self-pay | Admitting: Internal Medicine

## 2022-04-08 DIAGNOSIS — N390 Urinary tract infection, site not specified: Secondary | ICD-10-CM

## 2022-04-08 DIAGNOSIS — B9620 Unspecified Escherichia coli [E. coli] as the cause of diseases classified elsewhere: Secondary | ICD-10-CM

## 2022-04-08 HISTORY — DX: Unspecified Escherichia coli (E. coli) as the cause of diseases classified elsewhere: B96.20

## 2022-04-08 LAB — URINE CULTURE
MICRO NUMBER:: 14736696
SPECIMEN QUALITY:: ADEQUATE

## 2022-04-08 MED ORDER — CEFDINIR 300 MG PO CAPS: 300.0000 mg | ORAL_CAPSULE | Freq: Two times a day (BID) | ORAL | 0 refills | Status: AC

## 2022-04-08 NOTE — Assessment & Plan Note (Signed)
Sensitive to cephalosporins.  Omnicef  300 mg bid x 3 days [facility]

## 2022-04-15 DIAGNOSIS — H353123 Nonexudative age-related macular degeneration, left eye, advanced atrophic without subfoveal involvement: Secondary | ICD-10-CM | POA: Diagnosis not present

## 2022-04-15 DIAGNOSIS — H40001 Preglaucoma, unspecified, right eye: Secondary | ICD-10-CM | POA: Diagnosis not present

## 2022-04-15 DIAGNOSIS — H401121 Primary open-angle glaucoma, left eye, mild stage: Secondary | ICD-10-CM | POA: Diagnosis not present

## 2022-04-15 DIAGNOSIS — H353114 Nonexudative age-related macular degeneration, right eye, advanced atrophic with subfoveal involvement: Secondary | ICD-10-CM | POA: Diagnosis not present

## 2022-05-11 DIAGNOSIS — M25531 Pain in right wrist: Secondary | ICD-10-CM | POA: Diagnosis not present

## 2022-05-25 ENCOUNTER — Ambulatory Visit: Payer: PPO | Admitting: Nurse Practitioner

## 2022-05-27 ENCOUNTER — Ambulatory Visit (INDEPENDENT_AMBULATORY_CARE_PROVIDER_SITE_OTHER): Payer: PPO | Admitting: Nurse Practitioner

## 2022-05-27 ENCOUNTER — Encounter: Payer: Self-pay | Admitting: Nurse Practitioner

## 2022-05-27 VITALS — BP 128/82 | HR 70 | Temp 97.7°F | Ht 59.0 in | Wt 105.4 lb

## 2022-05-27 DIAGNOSIS — M47896 Other spondylosis, lumbar region: Secondary | ICD-10-CM | POA: Diagnosis not present

## 2022-05-27 DIAGNOSIS — M545 Low back pain, unspecified: Secondary | ICD-10-CM | POA: Diagnosis not present

## 2022-05-27 DIAGNOSIS — M4316 Spondylolisthesis, lumbar region: Secondary | ICD-10-CM | POA: Diagnosis not present

## 2022-05-27 DIAGNOSIS — M47816 Spondylosis without myelopathy or radiculopathy, lumbar region: Secondary | ICD-10-CM | POA: Diagnosis not present

## 2022-05-27 NOTE — Progress Notes (Signed)
Established Patient Office Visit  Subjective:  Patient ID: Gloria Carr, female    DOB: [birth year]  Age: 89 y.o. MRN: [id number]  CC:  Chief Complaint  Patient presents with   Tailbone Pain    Radiates to buttocks    HPI  Gloria Carr presents for pain in tailbone that radiates to her buttocks.  She ambulates with walker and is  accompanied by her son.  She has previous history of back pain and was prescribed tramadol and gabapentin for pain management.  She states that she is not taking any prescription medication for pain management.  She is taking Tylenol for pain management.  Patient has been seen previously at [facility] previously due to back pain. HPI   Past Medical History:  Diagnosis Date   Allergy    Arthritis    Closed fracture of left superior pubic ramus, with routine healing, subsequent encounter 08/24/2016   Glaucoma    History of chickenpox    History of recurrent UTIs    Hypertension    Macular degeneration    Thyroid disease     Past Surgical History:  Procedure Laterality Date   BILATERAL CARPAL TUNNEL RELEASE  1999-2000   CATARACT EXTRACTION, BILATERAL  2013    Family History  Problem Relation Age of Onset   Stroke Mother    Cancer Mother 29       colon   Cancer Father        bone   Cancer Son        Jaw   Stroke Sister    Breast cancer Neg Hx     Social History   Socioeconomic History   Marital status: Widowed    Spouse name: Not on file   Number of children: Not on file   Years of education: Not on file   Highest education level: Not on file  Occupational History   Not on file  Tobacco Use   Smoking status: Former   Smokeless tobacco: Never  Vaping Use   Vaping Use: Never used  Substance and Sexual Activity   Alcohol use: Yes    Alcohol/week: 5.0 standard drinks of alcohol    Types: 5 Standard drinks or equivalent per week    Comment: with dinner   Drug use: No   Sexual activity: Not Currently    Birth control/protection:  Post-menopausal  Other Topics Concern   Not on file  Social History Narrative   Not on file   Social Determinants of Health   Financial Resource Strain: Low Risk  (12/28/2021)   Overall Financial Resource Strain (CARDIA)    Difficulty of Paying Living Expenses: Not hard at all  Food Insecurity: No Food Insecurity (12/28/2021)   Hunger Vital Sign    Worried About Running Out of Food in the Last Year: Never true    Ran Out of Food in the Last Year: Never true  Transportation Needs: No Transportation Needs (12/28/2021)   PRAPARE - Administrator, Civil Service (Medical): No    Lack of Transportation (Non-Medical): No  Physical Activity: Sufficiently Active (12/28/2021)   Exercise Vital Sign    Days of Exercise per Week: 3 days    Minutes of Exercise per Session: 50 min  Stress: No Stress Concern Present (12/28/2021)   [employer] of Occupational Health - Occupational Stress Questionnaire    Feeling of Stress : Not at all  Social Connections: Unknown (12/28/2021)   Social Connection and Isolation Panel [NHANES]  Frequency of Communication with Friends and Family: More than three times a week    Frequency of Social Gatherings with Friends and Family: More than three times a week    Attends Religious Services: Not on file    Active Member of Clubs or Organizations: Not on file    Attends Banker Meetings: Not on file    Marital Status: Not on file  Intimate Partner Violence: Not At Risk (12/28/2021)   Humiliation, Afraid, Rape, and Kick questionnaire    Fear of Current or Ex-Partner: No    Emotionally Abused: No    Physically Abused: No    Sexually Abused: No     Outpatient Medications Prior to Visit  Medication Sig Dispense Refill   amLODipine (NORVASC) 5 MG tablet TAKE 1 TABLET BY MOUTH DAILY. DOSE INCREASE. 90 tablet 1   atorvastatin (LIPITOR) 20 MG tablet Take 1 tablet (20 mg total) by mouth daily. 90 tablet 3   Boswellia-Glucosamine-Vit D  (OSTEO BI-FLEX ONE PER DAY PO) Take 1 capsule by mouth daily.     Cholecalciferol (VITAMIN D3) 50 MCG (2000 UT) capsule Take 2,000 Units by mouth daily.     denosumab (PROLIA) 60 MG/ML SOSY injection Inject 60 mg into the skin every 6 (six) months.     diclofenac Sodium (VOLTAREN) 1 % GEL Apply 2 g topically 4 (four) times daily.     fluticasone (FLONASE) 50 MCG/ACT nasal spray Place into both nostrils daily. Reported on 01/17/2015     latanoprost (XALATAN) 0.005 % ophthalmic solution INSTILL 1 DROP IN THE LEFT EYE AT NIGHT  5   levothyroxine (SYNTHROID) 50 MCG tablet TAKE 1 TABLET EVERY DAY ON EMPTY STOMACHWITH A GLASS OF WATER AT LEAST 30-60 MINBEFORE BREAKFAST 90 tablet 3   Misc Natural Products (OSTEO BI-FLEX JOINT SHIELD PO) Take by mouth.     Multiple Vitamins-Minerals (CENTRUM SILVER PO) Take by mouth.     Multiple Vitamins-Minerals (PRESERVISION AREDS 2 PO) Take by mouth.     ondansetron (ZOFRAN) 4 MG tablet Take 1 tablet (4 mg total) by mouth every 8 (eight) hours as needed for nausea or vomiting. 20 tablet 0   Propylene Glycol (SYSTANE BALANCE OP) Apply to eye.     No facility-administered medications prior to visit.    Allergies  Allergen Reactions   Sulfa Antibiotics Other (See Comments)    Childhood reaction     ROS Review of Systems  Constitutional: Negative.   Respiratory: Negative.    Cardiovascular: Negative.   Gastrointestinal: Negative.   Musculoskeletal:  Positive for back pain.  Neurological: Negative.   Psychiatric/Behavioral: Negative.        Objective:    Physical Exam Constitutional:      Appearance: Normal appearance.  HENT:     Right Ear: Tympanic membrane normal.  Eyes:     Pupils: Pupils are equal, round, and reactive to light.  Cardiovascular:     Rate and Rhythm: Normal rate and regular rhythm.     Pulses: Normal pulses.     Heart sounds: Normal heart sounds. No murmur heard. Pulmonary:     Effort: Pulmonary effort is normal.     Breath  sounds: Normal breath sounds. No stridor. No wheezing.  Musculoskeletal:        General: Tenderness (Midline) present.  Neurological:     General: No focal deficit present.     Mental Status: She is alert. Mental status is at baseline.  Psychiatric:  Mood and Affect: Mood normal.        Behavior: Behavior normal.        Thought Content: Thought content normal.        Judgment: Judgment normal.     BP 128/82   Pulse 70   Temp 97.7 F (36.5 C) (Oral)   Ht 4\' 11"  (1.499 m)   Wt 105 lb 6.4 oz (47.8 kg)   LMP  (LMP Unknown)   SpO2 98%   BMI 21.29 kg/m  Wt Readings from Last 3 Encounters:  05/27/22 105 lb 6.4 oz (47.8 kg)  04/05/22 102 lb 12.8 oz (46.6 kg)  03/26/22 103 lb 12.8 oz (47.1 kg)     Health Maintenance  Topic Date Due   INFLUENZA VACCINE  08/12/2022   Medicare Annual Wellness (AWV)  12/29/2022   DTaP/Tdap/Td (4 - Td or Tdap) 12/10/2025   Pneumonia Vaccine 77+ Years old  Completed   DEXA SCAN  Completed   COVID-19 Vaccine  Completed   Zoster Vaccines- Shingrix  Completed   HPV VACCINES  Aged Out    There are no preventive care reminders to display for this patient.  Lab Results  Component Value Date   TSH 1.40 03/26/2022   Lab Results  Component Value Date   WBC 7.3 06/29/2021   HGB 14.5 06/29/2021   HCT 44.5 06/29/2021   MCV 92.3 06/29/2021   PLT 257.0 06/29/2021   Lab Results  Component Value Date   NA 139 03/26/2022   K 3.8 03/26/2022   CO2 29 03/26/2022   GLUCOSE 102 (H) 03/26/2022   BUN 17 03/26/2022   CREATININE 0.68 03/26/2022   BILITOT 0.9 03/26/2022   ALKPHOS 81 03/26/2022   AST 22 03/26/2022   ALT 19 03/26/2022   PROT 7.0 03/26/2022   ALBUMIN 3.9 03/26/2022   CALCIUM 9.2 03/26/2022   ANIONGAP 6 02/21/2018   GFR 75.32 03/26/2022   Lab Results  Component Value Date   CHOL 155 03/26/2022   Lab Results  Component Value Date   HDL 82.70 03/26/2022   Lab Results  Component Value Date   LDLCALC 60 03/26/2022   Lab  Results  Component Value Date   TRIG 64.0 03/26/2022   Lab Results  Component Value Date   CHOLHDL 2 03/26/2022   Lab Results  Component Value Date   HGBA1C 5.9 03/26/2022      Assessment & Plan:  Acute midline low back pain without sciatica Assessment & Plan: Patient refused to start on gabapentin. Advised patient to consult Ortho for possible steroid injections. Advised to use hot and cold pack alternatively and use over-the-counter Tylenol arthritis for pain management.      Follow-up: No follow-ups on file.   Kara Dies, NP

## 2022-06-02 DIAGNOSIS — M5136 Other intervertebral disc degeneration, lumbar region: Secondary | ICD-10-CM | POA: Diagnosis not present

## 2022-06-04 DIAGNOSIS — M5136 Other intervertebral disc degeneration, lumbar region: Secondary | ICD-10-CM | POA: Diagnosis not present

## 2022-06-07 NOTE — Assessment & Plan Note (Signed)
Patient refused to start on gabapentin. Advised patient to consult Ortho for possible steroid injections. Advised to use hot and cold pack alternatively and use over-the-counter Tylenol arthritis for pain management.

## 2022-06-16 DIAGNOSIS — M5136 Other intervertebral disc degeneration, lumbar region: Secondary | ICD-10-CM | POA: Diagnosis not present

## 2022-06-21 ENCOUNTER — Other Ambulatory Visit: Payer: Self-pay | Admitting: Internal Medicine

## 2022-07-02 ENCOUNTER — Ambulatory Visit: Payer: PPO | Admitting: Internal Medicine

## 2022-07-02 DIAGNOSIS — M5416 Radiculopathy, lumbar region: Secondary | ICD-10-CM | POA: Diagnosis not present

## 2022-07-09 ENCOUNTER — Encounter: Payer: Self-pay | Admitting: Internal Medicine

## 2022-07-09 ENCOUNTER — Ambulatory Visit (INDEPENDENT_AMBULATORY_CARE_PROVIDER_SITE_OTHER): Payer: PPO | Admitting: Internal Medicine

## 2022-07-09 VITALS — BP 110/62 | HR 68 | Temp 98.5°F | Ht 59.0 in | Wt 103.6 lb

## 2022-07-09 DIAGNOSIS — I739 Peripheral vascular disease, unspecified: Secondary | ICD-10-CM | POA: Diagnosis not present

## 2022-07-09 DIAGNOSIS — I10 Essential (primary) hypertension: Secondary | ICD-10-CM

## 2022-07-09 DIAGNOSIS — M810 Age-related osteoporosis without current pathological fracture: Secondary | ICD-10-CM | POA: Diagnosis not present

## 2022-07-09 DIAGNOSIS — Z7189 Other specified counseling: Secondary | ICD-10-CM

## 2022-07-09 DIAGNOSIS — I700 Atherosclerosis of aorta: Secondary | ICD-10-CM | POA: Diagnosis not present

## 2022-07-09 MED ORDER — AMLODIPINE BESYLATE 5 MG PO TABS: ORAL_TABLET | ORAL | 1 refills | Status: DC

## 2022-07-09 MED ORDER — ATORVASTATIN CALCIUM 20 MG PO TABS: 20.0000 mg | ORAL_TABLET | Freq: Every day | ORAL | 3 refills | Status: DC

## 2022-07-09 NOTE — Assessment & Plan Note (Signed)
She has a living will and will provide  a copy,  she remains FULL CODE PER DISCUSSION TODAY

## 2022-07-09 NOTE — Telephone Encounter (Signed)
Pt son called in stating that Dr. Darrick Huntsman has told her that she's due for her Prolia shot in August. I made the appt, however as per last message below the due amount its 276.22$. is that correct?

## 2022-07-09 NOTE — Assessment & Plan Note (Signed)
With history of pelvic fractures. Continue Prolia every 6 months., last injection Feb 18 2022    Reviewed calcium and vitamin D supplemementation. Reviewed  Last DEXA in Oct 2022 which  noted improvement I n BMD .

## 2022-07-09 NOTE — Assessment & Plan Note (Signed)
Mild, by ABI's MOST RECENTLY done  screening far 0.78 and 0.86  .  Asymptomatic . Repeat next year   continue statin

## 2022-07-09 NOTE — Assessment & Plan Note (Addendum)
Reviewed findings of prior CT scan today..  Patient is tolerating the switch from taking pravastatin  to atorvastatin

## 2022-07-09 NOTE — Patient Instructions (Addendum)
Stop the osteo biflex and the centrum silver  and the probiotic   Continue the Vitamin D supplement   Your next Prolia injection is due in August   Continue benefiber daily  Continue atorvastatin,  stop pravastatin if you are taking both    You can take up to 2000 mg of acetominophen (tylenol) every day safely  In divided doses (500 mg every 6 hours  Or 1000 mg every 12 hours.)

## 2022-07-09 NOTE — Assessment & Plan Note (Signed)
No changes to regimen OF AMLODIPINE 5 MG DAILY today given age and prevalance of readings < 140/90

## 2022-07-09 NOTE — Progress Notes (Signed)
Subjective:  Patient ID: Gloria Carr, female    DOB: 02-Jul-1927  Age: 89 y.o. MRN: [id number]  CC: The primary encounter diagnosis was Age-related osteoporosis without current pathological fracture. Diagnoses of Abdominal aortic atherosclerosis (HCC), Counseling regarding advance care planning and goals of care, Essential hypertension, and Peripheral vascular disease (HCC) were also pertinent to this visit.   HPI Gloria Carr presents for  Chief Complaint  Patient presents with   Medical Management of Chronic Issues    6 month follow up     "Bowel problems"'  :  same chronic complaint.  Has a hard BM in the morning, followed by progressively looser stools which end at 10:00 .  I'm taking too many pills"    Nocturia x 3  ,  has cut out caffeine; no change.  Discussed the medications:  she declined.  Trauble falling alslep but declines mes    Low back pain ;  referred to [facility] for sciatica bilateral,  had a painful ESI that has helped with the severe pain. . Still  has pain with prolonged sitting. Which inproves with activity  attending a balance class 2 times per week.  Walking  daily to the mailbox .  Takes her walker.   Weight loss:  eats 3 times daily  Outpatient Medications Prior to Visit  Medication Sig Dispense Refill   Cholecalciferol (VITAMIN D3) 50 MCG (2000 UT) capsule Take 2,000 Units by mouth daily.     denosumab (PROLIA) 60 MG/ML SOSY injection Inject 60 mg into the skin every 6 (six) months.     diclofenac Sodium (VOLTAREN) 1 % GEL Apply 2 g topically 4 (four) times daily.     latanoprost (XALATAN) 0.005 % ophthalmic solution INSTILL 1 DROP IN THE LEFT EYE AT NIGHT  5   levothyroxine (SYNTHROID) 50 MCG tablet TAKE 1 TABLET EVERY DAY ON EMPTY STOMACHWITH A GLASS OF WATER AT LEAST 30-60 MINBEFORE BREAKFAST 90 tablet 3   Multiple Vitamins-Minerals (PRESERVISION AREDS 2 PO) Take by mouth.     ondansetron (ZOFRAN) 4 MG tablet Take 1 tablet (4 mg total) by mouth every 8  (eight) hours as needed for nausea or vomiting. 20 tablet 0   Propylene Glycol (SYSTANE BALANCE OP) Apply to eye.     amLODipine (NORVASC) 5 MG tablet TAKE 1 TABLET BY MOUTH DAILY. DOSE INCREASE. 90 tablet 1   atorvastatin (LIPITOR) 20 MG tablet Take 1 tablet (20 mg total) by mouth daily. 90 tablet 3   Misc Natural Products (OSTEO BI-FLEX JOINT SHIELD PO) Take by mouth.     Multiple Vitamins-Minerals (CENTRUM SILVER PO) Take by mouth.     pravastatin (PRAVACHOL) 20 MG tablet TAKE 1 TABLET BY MOUTH DAILY 90 tablet 3   Boswellia-Glucosamine-Vit D (OSTEO BI-FLEX ONE PER DAY PO) Take 1 capsule by mouth daily. (Patient not taking: Reported on 07/09/2022)     fluticasone (FLONASE) 50 MCG/ACT nasal spray Place into both nostrils daily. Reported on 01/17/2015 (Patient not taking: Reported on 07/09/2022)     No facility-administered medications prior to visit.    Review of Systems;  Patient denies headache, fevers, malaise, unintentional weight loss, skin rash, eye pain, sinus congestion and sinus pain, sore throat, dysphagia,  hemoptysis , cough, dyspnea, wheezing, chest pain, palpitations, orthopnea, edema, abdominal pain, nausea, melena, diarrhea, constipation, flank pain, dysuria, hematuria, urinary  Frequency, nocturia, numbness, tingling, seizures,  Focal weakness, Loss of consciousness,  Tremor, insomnia, depression, anxiety, and suicidal ideation.      Objective:  BP 110/62   Pulse 68   Temp 98.5 F (36.9 C) (Oral)   Ht 4\' 11"  (1.499 m)   Wt 103 lb 9.6 oz (47 kg)   LMP  (LMP Unknown)   SpO2 95%   BMI 20.92 kg/m   BP Readings from Last 3 Encounters:  07/09/22 110/62  05/27/22 128/82  04/05/22 104/64    Wt Readings from Last 3 Encounters:  07/09/22 103 lb 9.6 oz (47 kg)  05/27/22 105 lb 6.4 oz (47.8 kg)  04/05/22 102 lb 12.8 oz (46.6 kg)    Physical Exam  Lab Results  Component Value Date   HGBA1C 5.9 03/26/2022   HGBA1C 5.9 06/29/2021   HGBA1C 6.1 01/16/2021    Lab  Results  Component Value Date   CREATININE 0.68 03/26/2022   CREATININE 0.66 12/31/2021   CREATININE 0.80 09/16/2021    Lab Results  Component Value Date   WBC 7.3 06/29/2021   HGB 14.5 06/29/2021   HCT 44.5 06/29/2021   PLT 257.0 06/29/2021   GLUCOSE 102 (H) 03/26/2022   CHOL 155 03/26/2022   TRIG 64.0 03/26/2022   HDL 82.70 03/26/2022   LDLDIRECT 52.0 03/26/2022   LDLCALC 60 03/26/2022   ALT 19 03/26/2022   AST 22 03/26/2022   NA 139 03/26/2022   K 3.8 03/26/2022   CL 103 03/26/2022   CREATININE 0.68 03/26/2022   BUN 17 03/26/2022   CO2 29 03/26/2022   TSH 1.40 03/26/2022   HGBA1C 5.9 03/26/2022   MICROALBUR 1.1 03/26/2022    CT ABDOMEN PELVIS W CONTRAST  Result Date: 09/17/2021 CLINICAL DATA:  Abdominal distension and bloating. Constipation. Evaluate ovarian pathology. EXAM: CT ABDOMEN AND PELVIS WITH CONTRAST TECHNIQUE: Multidetector CT imaging of the abdomen and pelvis was performed using the standard protocol following bolus administration of intravenous contrast. RADIATION DOSE REDUCTION: This exam was performed according to the departmental dose-optimization program which includes automated exposure control, adjustment of the mA and/or kV according to patient size and/or use of iterative reconstruction technique. CONTRAST:  80mL OMNIPAQUE IOHEXOL 300 MG/ML  SOLN COMPARISON:  February 21 2018 FINDINGS: Lower chest: No acute abnormality. Hepatobiliary: Nodular contour of the liver is identified unchanged. Probable fatty infiltration of the liver with areas of sparing are stable compared prior exam. The gallbladder and biliary tree are normal. Pancreas: Pancreatic duct prominence is unchanged compared prior exam. The pancreas is otherwise unremarkable. Spleen: Normal in size without focal abnormality. Adrenals/Urinary Tract: The right adrenal gland is normal. Atrophy of left adrenal gland is unchanged. Small focal areas of scar are identified in both kidneys. Bilateral  parapelvic renal cysts are identified unchanged. No follow-up is necessary. No hydronephrosis bilaterally. The bladder is normal. Stomach/Bowel: Small hiatal hernia is identified. The stomach is otherwise normal. There is no small bowel obstruction. Diverticulosis of the colon without diverticulitis noted. Moderate bowel content is identified throughout colon. The appendix is normal. Vascular/Lymphatic: Aortic atherosclerosis. No enlarged abdominal or pelvic lymph nodes. Reproductive: Uterus and bilateral adnexa are unremarkable. No abnormal masses are identified in bilateral adnexa. Other: None. Musculoskeletal: Degenerative joint changes of the spine are noted. IMPRESSION: 1. No acute abnormality identified in the abdomen and pelvis. 2. No abnormal masses are identified in bilateral adnexa. 3. Moderate bowel content is identified throughout colon. This can be seen in constipation. Aortic Atherosclerosis (ICD10-I70.0). Electronically Signed   By: Sherian Rein M.D.   On: 09/17/2021 14:32    Assessment & Plan:  .Age-related osteoporosis without current pathological fracture Assessment &  Plan: With history of pelvic fractures. Continue Prolia every 6 months., last injection Feb 18 2022    Reviewed calcium and vitamin D supplemementation. Reviewed  Last DEXA in Oct 2022 which  noted improvement I n BMD .    Abdominal aortic atherosclerosis (HCC) Assessment & Plan: Reviewed findings of prior CT scan today..  Patient is tolerating the switch from taking pravastatin  to atorvastatin    Counseling regarding advance care planning and goals of care Assessment & Plan: She has a living will and will provide  a copy,  she remains FULL CODE PER DISCUSSION TODAY   Essential hypertension Assessment & Plan: No changes to regimen OF AMLODIPINE 5 MG DAILY today given age and prevalance of readings < 140/90   Peripheral vascular disease (HCC) Assessment & Plan: Mild, by ABI's MOST RECENTLY done  screening  far 0.78 and 0.86  .  Asymptomatic . Repeat next year   continue statin    Other orders -     amLODIPine Besylate; TAKE 1 TABLET BY MOUTH DAILY. DOSE INCREASE.  Dispense: 90 tablet; Refill: 1 -     Atorvastatin Calcium; Take 1 tablet (20 mg total) by mouth daily.  Dispense: 90 tablet; Refill: 3     I provided 33 minutes of face-to-face time during this encounter reviewing patient's last visit with me, patient's  most recent visit with  ORTHOPEDICS   recent surgical and non surgical procedures, previous  labs and imaging studies, counseling on currently addressed issues,  and post visit ordering to diagnostics and therapeutics .   Follow-up: Return in about 6 months (around 01/08/2023).   Sherlene Shams, MD

## 2022-07-12 NOTE — Telephone Encounter (Signed)
Printed and placed in red folder.

## 2022-07-14 NOTE — Telephone Encounter (Signed)
Will revisit this message once benefit verification is complete.

## 2022-07-27 ENCOUNTER — Encounter: Payer: Self-pay | Admitting: *Deleted

## 2022-07-27 NOTE — Telephone Encounter (Signed)
Sent mychart message  Appt already scheduled on 08/23/22  $276.22 due; PA not required

## 2022-08-18 DIAGNOSIS — M47896 Other spondylosis, lumbar region: Secondary | ICD-10-CM | POA: Diagnosis not present

## 2022-08-18 DIAGNOSIS — M5136 Other intervertebral disc degeneration, lumbar region: Secondary | ICD-10-CM | POA: Diagnosis not present

## 2022-08-23 ENCOUNTER — Ambulatory Visit (INDEPENDENT_AMBULATORY_CARE_PROVIDER_SITE_OTHER): Payer: PPO

## 2022-08-23 DIAGNOSIS — M810 Age-related osteoporosis without current pathological fracture: Secondary | ICD-10-CM

## 2022-08-23 MED ORDER — DENOSUMAB 60 MG/ML ~~LOC~~ SOSY
60.0000 mg | PREFILLED_SYRINGE | Freq: Once | SUBCUTANEOUS | Status: AC
  Administered 2022-08-23: 60 mg via SUBCUTANEOUS

## 2022-08-23 NOTE — Progress Notes (Signed)
Pt presented for their subcutaneous Prolia injection. Pt was identified through two identifiers. Pt was given the information packets about the Prolia and told to schedule their next injection 6 months out. Pt tolerated the subq injection well in the left arm.  

## 2022-09-15 DIAGNOSIS — M47816 Spondylosis without myelopathy or radiculopathy, lumbar region: Secondary | ICD-10-CM | POA: Diagnosis not present

## 2022-09-27 ENCOUNTER — Ambulatory Visit: Payer: PPO | Admitting: Internal Medicine

## 2022-10-07 DIAGNOSIS — M5136 Other intervertebral disc degeneration, lumbar region: Secondary | ICD-10-CM | POA: Diagnosis not present

## 2022-10-07 DIAGNOSIS — M47896 Other spondylosis, lumbar region: Secondary | ICD-10-CM | POA: Diagnosis not present

## 2022-10-15 DIAGNOSIS — H353114 Nonexudative age-related macular degeneration, right eye, advanced atrophic with subfoveal involvement: Secondary | ICD-10-CM | POA: Diagnosis not present

## 2022-10-15 DIAGNOSIS — H353221 Exudative age-related macular degeneration, left eye, with active choroidal neovascularization: Secondary | ICD-10-CM | POA: Diagnosis not present

## 2022-10-15 DIAGNOSIS — H401121 Primary open-angle glaucoma, left eye, mild stage: Secondary | ICD-10-CM | POA: Diagnosis not present

## 2022-10-15 DIAGNOSIS — H40001 Preglaucoma, unspecified, right eye: Secondary | ICD-10-CM | POA: Diagnosis not present

## 2022-10-20 DIAGNOSIS — M47896 Other spondylosis, lumbar region: Secondary | ICD-10-CM | POA: Diagnosis not present

## 2022-10-20 DIAGNOSIS — M51369 Other intervertebral disc degeneration, lumbar region without mention of lumbar back pain or lower extremity pain: Secondary | ICD-10-CM | POA: Diagnosis not present

## 2022-10-25 DIAGNOSIS — Z961 Presence of intraocular lens: Secondary | ICD-10-CM | POA: Diagnosis not present

## 2022-10-25 DIAGNOSIS — H353114 Nonexudative age-related macular degeneration, right eye, advanced atrophic with subfoveal involvement: Secondary | ICD-10-CM | POA: Diagnosis not present

## 2022-10-25 DIAGNOSIS — H353221 Exudative age-related macular degeneration, left eye, with active choroidal neovascularization: Secondary | ICD-10-CM | POA: Diagnosis not present

## 2022-11-11 ENCOUNTER — Telehealth: Payer: Self-pay | Admitting: Internal Medicine

## 2022-11-11 NOTE — Telephone Encounter (Signed)
Patient just called and wants to know if she has had her RSV Shot yet. She would like for someone to call her and to let her know. She would like to get it today at where she is staying at. But they don't want to give it to her if she already has it. Her number is [phone]. Can someone call her.

## 2022-11-12 NOTE — Telephone Encounter (Signed)
Pt is aware that we do not have documentation that she has received the RSV vaccine here at our office

## 2022-12-10 ENCOUNTER — Other Ambulatory Visit: Payer: Self-pay

## 2022-12-10 ENCOUNTER — Emergency Department
Admission: EM | Admit: 2022-12-10 | Discharge: 2022-12-10 | Disposition: A | Discharge status: Home / Self Care | Payer: PPO | Attending: Emergency Medicine | Admitting: Emergency Medicine

## 2022-12-10 ENCOUNTER — Emergency Department: Payer: PPO

## 2022-12-10 ENCOUNTER — Other Ambulatory Visit: Payer: Self-pay | Admitting: Internal Medicine

## 2022-12-10 DIAGNOSIS — S3210XA Unspecified fracture of sacrum, initial encounter for closed fracture: Secondary | ICD-10-CM | POA: Diagnosis not present

## 2022-12-10 DIAGNOSIS — K5730 Diverticulosis of large intestine without perforation or abscess without bleeding: Secondary | ICD-10-CM | POA: Diagnosis not present

## 2022-12-10 DIAGNOSIS — S7002XA Contusion of left hip, initial encounter: Secondary | ICD-10-CM | POA: Insufficient documentation

## 2022-12-10 DIAGNOSIS — W01198A Fall on same level from slipping, tripping and stumbling with subsequent striking against other object, initial encounter: Secondary | ICD-10-CM | POA: Insufficient documentation

## 2022-12-10 DIAGNOSIS — M1612 Unilateral primary osteoarthritis, left hip: Secondary | ICD-10-CM | POA: Diagnosis not present

## 2022-12-10 DIAGNOSIS — I10 Essential (primary) hypertension: Secondary | ICD-10-CM | POA: Insufficient documentation

## 2022-12-10 DIAGNOSIS — S79912A Unspecified injury of left hip, initial encounter: Secondary | ICD-10-CM | POA: Diagnosis present

## 2022-12-10 DIAGNOSIS — M25552 Pain in left hip: Secondary | ICD-10-CM | POA: Diagnosis not present

## 2022-12-10 NOTE — ED Triage Notes (Signed)
Pt states that she lost her balance reaching for her phone and fell, hitting her left hip on the floor, pt went to [facility] who sent the pt here with xrays that show left femoral neck fx. Pt ambulatory with her rollator

## 2022-12-10 NOTE — ED Provider Notes (Signed)
[facility] Provider Note    Event Date/Time   First MD Initiated Contact with Patient 12/10/22 1253     (approximate)   History   Chief Complaint: Leg Injury   HPI  Gloria Carr is a 89 y.o. female with a history of hypertension, osteoporosis who was sent to the ED today due to left hip pain after fall.  Patient had a fall a few days ago resulting in some left hip pain but she was still ambulatory, and then earlier today while trying to climb into her family members truck she fell again onto the left hip, no pain with being seated but is painful to bear weight.          Physical Exam   Triage Vital Signs: ED Triage Vitals [12/10/22 1236]  Encounter Vitals Group     BP (!) 141/74     Systolic BP Percentile      Diastolic BP Percentile      Pulse Rate 89     Resp 16     Temp 98.4 F (36.9 C)     Temp Source Oral     SpO2 95 %     Weight 108 lb (49 kg)     Height 4\' 9"  (1.448 m)     Head Circumference      Peak Flow      Pain Score 9     Pain Loc      Pain Education      Exclude from Growth Chart     Most recent vital signs: Vitals:   12/10/22 1236  BP: (!) 141/74  Pulse: 89  Resp: 16  Temp: 98.4 F (36.9 C)  SpO2: 95%    General: Awake, no distress.  CV:  Good peripheral perfusion.  Regular rate and rhythm Resp:  Normal effort.  Clear to auscultation bilaterally Abd:  No distention.  Soft nontender Other:  No midline spinal tenderness.  Tolerates with passive range of motion of the left hip.  No tenderness over the greater trochanter.  Mild tenderness posteriorly over the gluteal aspect of the hip   ED Results / Procedures / Treatments   Labs (all labs ordered are listed, but only abnormal results are displayed) Labs Reviewed - No data to display   EKG    RADIOLOGY CT left hip and pelvis interpreted by me, negative for acute fracture.  Radiology report reviewed   PROCEDURES:  Procedures   MEDICATIONS ORDERED  IN ED: Medications - No data to display   IMPRESSION / MDM / ASSESSMENT AND PLAN / ED COURSE  I reviewed the triage vital signs and the nursing notes.  DDx: Hip fracture, soft tissue contusion  Patient's presentation is most consistent with acute presentation with potential threat to life or bodily function.  Patient comes ED due to left hip pain after mechanical fall.  Denies any other acute complaints.  Currently pain-free while at rest in the bed.  Pain only present when patient is upright and weightbearing.  She uses a walker at baseline.  She had x-ray performed in orthopedic clinic which was concerning for a possible nondisplaced subcapital fracture.  The patient's family however brings a printout of the image, which is not convincing for fracture, however with her osteoporosis, advanced age, pain with weightbearing, CT obtained.  Thankfully this is negative for fracture and she stable for discharge.       FINAL CLINICAL IMPRESSION(S) / ED DIAGNOSES   Final diagnoses:  Contusion  of left hip, initial encounter     Rx / DC Orders   ED Discharge Orders     None        Note:  This document was prepared using Dragon voice recognition software and may include unintentional dictation errors.   Sharman Cheek, MD 12/10/22 [phone]

## 2022-12-10 NOTE — Discharge Instructions (Addendum)
The CT scan does not show any fracture of the left femur.  Your pain appears to be due to bruising of the soft tissues.  Continue taking your medications as prescribed, and continue light activity as tolerated with your walker.

## 2023-01-03 ENCOUNTER — Ambulatory Visit (INDEPENDENT_AMBULATORY_CARE_PROVIDER_SITE_OTHER): Payer: PPO | Admitting: *Deleted

## 2023-01-03 VITALS — BP 124/70 | Ht 59.0 in | Wt 103.2 lb

## 2023-01-03 DIAGNOSIS — Z0000 Encounter for general adult medical examination without abnormal findings: Secondary | ICD-10-CM | POA: Diagnosis not present

## 2023-01-03 NOTE — Progress Notes (Signed)
Subjective:   Gloria Carr is a 89 y.o. female who presents for Medicare Annual (Subsequent) preventive examination.  Visit Complete: In person  Cardiac Risk Factors include: advanced age (>54men, >22 women);dyslipidemia;hypertension     Objective:    Today's Vitals   01/03/23 1120 01/03/23 1122  BP: 124/70   Weight: 103 lb 4 oz (46.8 kg)   Height: 4\' 11"  (1.499 m)   PainSc:  9    Body mass index is 20.85 kg/m.     01/03/2023   11:40 AM 12/10/2022   12:38 PM 12/28/2021    3:23 PM 12/26/2020   12:36 PM 12/26/2019   12:47 PM 12/25/2018   12:24 PM 02/21/2018   11:12 AM  Advanced Directives  Does Patient Have a Medical Advance Directive? Yes Yes Yes Yes Yes Yes No  Type of Estate agent of [place];Living will Healthcare Power of [place];Living will Healthcare Power of [place];Living will Healthcare Power of [place];Living will Healthcare Power of [place];Living will Healthcare Power of [place];Living will   Does patient want to make changes to medical advance directive? No - Patient declined  No - Patient declined No - Patient declined No - Patient declined No - Patient declined   Copy of Healthcare Power of Attorney in Chart? Yes - validated most recent copy scanned in chart (See row information)  Yes - validated most recent copy scanned in chart (See row information) Yes - validated most recent copy scanned in chart (See row information) Yes - validated most recent copy scanned in chart (See row information) Yes - validated most recent copy scanned in chart (See row information)   Would patient like information on creating a medical advance directive?       No - Patient declined    Current Medications (verified) Outpatient Encounter Medications as of 01/03/2023  Medication Sig   amLODipine (NORVASC) 5 MG tablet TAKE 1 TABLET BY MOUTH DAILY. DOSE INCREASE.   atorvastatin (LIPITOR) 20 MG tablet Take 1 tablet (20 mg total) by mouth daily.    Cholecalciferol (VITAMIN D3) 50 MCG (2000 UT) capsule Take 2,000 Units by mouth daily.   denosumab (PROLIA) 60 MG/ML SOSY injection Inject 60 mg into the skin every 6 (six) months.   diclofenac Sodium (VOLTAREN) 1 % GEL Apply 2 g topically 4 (four) times daily.   latanoprost (XALATAN) 0.005 % ophthalmic solution INSTILL 1 DROP IN THE LEFT EYE AT NIGHT   levothyroxine (SYNTHROID) 50 MCG tablet TAKE 1 TABLET EVERY DAY ON EMPTY STOMACHWITH A GLASS OF WATER AT LEAST 30-60 MINBEFORE BREAKFAST   Multiple Vitamins-Minerals (PRESERVISION AREDS 2 PO) Take by mouth.   Propylene Glycol (SYSTANE BALANCE OP) Apply to eye.   traMADol (ULTRAM) 50 MG tablet Take 50 mg by mouth 3 (three) times daily as needed.   ondansetron (ZOFRAN) 4 MG tablet Take 1 tablet (4 mg total) by mouth every 8 (eight) hours as needed for nausea or vomiting. (Patient not taking: Reported on 01/03/2023)   No facility-administered encounter medications on file as of 01/03/2023.    Allergies (verified) Sulfa antibiotics   History: Past Medical History:  Diagnosis Date   Allergy    Arthritis    Closed fracture of left superior pubic ramus, with routine healing, subsequent encounter 08/24/2016   Glaucoma    History of chickenpox    History of recurrent UTIs    Hypertension    Macular degeneration    Thyroid disease    Past Surgical History:  Procedure Laterality Date  BILATERAL CARPAL TUNNEL RELEASE  1999-2000   CATARACT EXTRACTION, BILATERAL  2013   Family History  Problem Relation Age of Onset   Stroke Mother    Cancer Mother 16       colon   Cancer Father        bone   Cancer Son        Jaw   Stroke Sister    Breast cancer Neg Hx    Social History   Socioeconomic History   Marital status: Widowed    Spouse name: Not on file   Number of children: Not on file   Years of education: Not on file   Highest education level: Not on file  Occupational History   Not on file  Tobacco Use   Smoking status: Former    Smokeless tobacco: Never  Vaping Use   Vaping status: Never Used  Substance and Sexual Activity   Alcohol use: Yes    Alcohol/week: 5.0 standard drinks of alcohol    Types: 5 Standard drinks or equivalent per week    Comment: with dinner   Drug use: No   Sexual activity: Not Currently    Birth control/protection: Post-menopausal  Other Topics Concern   Not on file  Social History Narrative   Not on file   Social Drivers of Health   Financial Resource Strain: Low Risk  (01/03/2023)   Overall Financial Resource Strain (CARDIA)    Difficulty of Paying Living Expenses: Not hard at all  Food Insecurity: No Food Insecurity (01/03/2023)   Hunger Vital Sign    Worried About Running Out of Food in the Last Year: Never true    Ran Out of Food in the Last Year: Never true  Transportation Needs: No Transportation Needs (01/03/2023)   PRAPARE - Administrator, Civil Service (Medical): No    Lack of Transportation (Non-Medical): No  Physical Activity: Insufficiently Active (01/03/2023)   Exercise Vital Sign    Days of Exercise per Week: 2 days    Minutes of Exercise per Session: 40 min  Stress: No Stress Concern Present (01/03/2023)   [employer] of Occupational Health - Occupational Stress Questionnaire    Feeling of Stress : Only a little  Social Connections: Moderately Isolated (01/03/2023)   Social Connection and Isolation Panel [NHANES]    Frequency of Communication with Friends and Family: More than three times a week    Frequency of Social Gatherings with Friends and Family: More than three times a week    Attends Religious Services: More than 4 times per year    Active Member of [employer] or Organizations: No    Attends Banker Meetings: Never    Marital Status: Widowed    Tobacco Counseling Counseling given: Not Answered   Clinical Intake:  Pre-visit preparation completed: Yes  Pain : 0-10 Pain Score: 9  Pain Type: Chronic pain Pain  Location: Buttocks Pain Descriptors / Indicators: Jabbing, Sharp Pain Onset: More than a month ago Pain Frequency: Constant     BMI - recorded: 20.85 Nutritional Risks: None Diabetes: No  How often do you need to have someone help you when you read instructions, pamphlets, or other written materials from your doctor or pharmacy?: 1 - Never  Interpreter Needed?: No  Information entered by :: R. Adelaido Nicklaus LPN   Activities of Daily Living    01/03/2023   11:26 AM  In your present state of health, do you have any difficulty performing the following  activities:  Hearing? 1  Vision? 1  Comment wears glasses  Difficulty concentrating or making decisions? 1  Walking or climbing stairs? 1  Dressing or bathing? 0  Doing errands, shopping? 1  Preparing Food and eating ? N  Using the Toilet? N  In the past six months, have you accidently leaked urine? Y  Comment wears pads  Do you have problems with loss of bowel control? Y  Managing your Medications? N  Managing your Finances? N  Housekeeping or managing your Housekeeping? N    Patient Care Team: Sherlene Shams, MD as PCP - General (Internal Medicine)  Indicate any recent Medical Services you may have received from other than [facility] providers in the past year (date may be approximate).     Assessment:   This is a routine wellness examination for [place].  Hearing/Vision screen Hearing Screening - Comments:: Some issues Vision Screening - Comments:: glasses   Goals Addressed             This Visit's Progress    Patient Stated       Get rid of pain so that she can be more active       Depression Screen    01/03/2023   11:34 AM 07/09/2022    1:38 PM 05/27/2022   11:48 AM 03/26/2022   10:51 AM 12/31/2021   11:50 AM 12/28/2021    3:19 PM 10/09/2021    2:37 PM  PHQ 2/9 Scores  PHQ - 2 Score 0 0 0 0 0 0 0  PHQ- 9 Score 1    9      Fall Risk    01/03/2023   11:29 AM 07/09/2022    1:38 PM 05/27/2022   11:48 AM  03/26/2022   10:51 AM 12/31/2021   11:50 AM  Fall Risk   Falls in the past year? 1 1 0 1   Number falls in past yr: 1 0 0 0 0  Injury with Fall? 0 1 0 0 0  Risk for fall due to : History of fall(s);Impaired balance/gait History of fall(s) No Fall Risks History of fall(s) No Fall Risks  Follow up Falls evaluation completed;Falls prevention discussed Falls evaluation completed Falls evaluation completed Falls evaluation completed Falls evaluation completed    MEDICARE RISK AT HOME: Medicare Risk at Home Any stairs in or around the home?: Yes If so, are there any without handrails?: No Home free of loose throw rugs in walkways, pet beds, electrical cords, etc?: Yes Adequate lighting in your home to reduce risk of falls?: Yes Life alert?: Yes Use of a cane, walker or w/c?: Yes Grab bars in the bathroom?: Yes Shower chair or bench in shower?: No Elevated toilet seat or a handicapped toilet?: No  TIMED UP AND GO:  Was the test performed?  Yes  Length of time to ambulate 10 feet: 12 sec Gait slow and steady with assistive device    Cognitive Function:    12/09/2016    1:43 PM 12/10/2014    1:29 PM  MMSE - Mini Mental State Exam  Orientation to time 5 5  Orientation to Place 5 5  Registration 3 3  Attention/ Calculation 5 5  Recall 2 3  Language- name 2 objects 2 2  Language- repeat 1 1  Language- follow 3 step command 3 3  Language- read & follow direction 1 1  Write a sentence 1 1  Copy design 1 1  Total score 29 30  01/03/2023   11:40 AM 12/28/2021    3:29 PM 12/26/2020    1:11 PM 12/26/2019   12:57 PM 12/25/2018   12:30 PM  6CIT Screen  What Year? 0 points 0 points 0 points 0 points 0 points  What month? 0 points 0 points 0 points 0 points 0 points  What time? 0 points 0 points 0 points 0 points 0 points  Count back from 20 0 points 0 points  0 points 0 points  Months in reverse 0 points 0 points 4 points 2 points 0 points  Repeat phrase 2 points 0  points   0 points  Total Score 2 points 0 points   0 points    Immunizations Immunization History  Administered Date(s) Administered   Fluad Quad(high Dose 65+) 10/17/2019   Influenza, High Dose Seasonal PF 10/16/2015, 10/31/2017   Influenza-Unspecified 09/12/2014, 09/27/2016, 11/08/2018, 10/25/2020, 12/10/2021   PFIZER Comirnaty(Gray Top)Covid-19 Tri-Sucrose Vaccine 01/17/2019, 02/14/2019, 10/18/2019   PFIZER(Purple Top)SARS-COV-2 Vaccination 01/17/2019, 02/14/2019, 10/18/2019, 05/28/2020   Pfizer(Comirnaty)Fall Seasonal Vaccine 12 years and older 12/10/2021   Pneumococcal Conjugate-13 01/17/2015   Pneumococcal Polysaccharide-23 01/17/2011   Td 12/11/2015   Tdap 12/11/2015, 12/11/2015   Zoster Recombinant(Shingrix) 07/16/2020, 11/17/2020   Zoster, Live 07/15/2012    TDAP status: Up to date  Flu Vaccine status: Due, Education has been provided regarding the importance of this vaccine. Advised may receive this vaccine at local pharmacy or Health Dept. Aware to provide a copy of the vaccination record if obtained from local pharmacy or Health Dept. Verbalized acceptance and understanding.  Pneumococcal vaccine status: Due, Education has been provided regarding the importance of this vaccine. Advised may receive this vaccine at local pharmacy or Health Dept. Aware to provide a copy of the vaccination record if obtained from local pharmacy or Health Dept. Verbalized acceptance and understanding.  Covid-19 vaccine status: Information provided on how to obtain vaccines.   Qualifies for Shingles Vaccine? Yes   Zostavax completed Yes   Shingrix Completed?: Yes  Screening Tests Health Maintenance  Topic Date Due   Pneumonia Vaccine 22+ Years old (2 of 2 - PCV) 01/17/2015   INFLUENZA VACCINE  08/12/2022   COVID-19 Vaccine (9 - 2024-25 season) 09/12/2022   Medicare Annual Wellness (AWV)  12/29/2022   DTaP/Tdap/Td (4 - Td or Tdap) 12/10/2025   DEXA SCAN  Completed   Zoster Vaccines-  Shingrix  Completed   HPV VACCINES  Aged Out    Health Maintenance  Health Maintenance Due  Topic Date Due   Pneumonia Vaccine 18+ Years old (2 of 2 - PCV) 01/17/2015   INFLUENZA VACCINE  08/12/2022   COVID-19 Vaccine (9 - 2024-25 season) 09/12/2022   Medicare Annual Wellness (AWV)  12/29/2022    Colorectal cancer screening: No longer required.   Mammogram status: No longer required due to age.  Bone Density status: Completed 10/2020. Results reflect: Bone density results: OSTEOPOROSIS. Repeat every 2 years.  Lung Cancer Screening: (Low Dose CT Chest recommended if Age 35-80 years, 20 pack-year currently smoking OR have quit w/in 15years.) does not qualify.    Additional Screening:  Hepatitis C Screening: does not qualify; Completed NA age  Vision Screening: Recommended annual ophthalmology exams for early detection of glaucoma and other disorders of the eye. Is the patient up to date with their annual eye exam?  Yes  Who is the provider or what is the name of the office in which the patient attends annual eye exams? [facility] If pt is not  established with a provider, would they like to be referred to a provider to establish care? No .   Dental Screening: Recommended annual dental exams for proper oral hygiene    Community Resource Referral / Chronic Care Management: CRR required this visit?  No   CCM required this visit?  No     Plan:     I have personally reviewed and noted the following in the patient's chart:   Medical and social history Use of alcohol, tobacco or illicit drugs  Current medications and supplements including opioid prescriptions. Patient is currently taking opioid prescriptions. Information provided to patient regarding non-opioid alternatives. Patient advised to discuss non-opioid treatment plan with their provider. Functional ability and status Nutritional status Physical activity Advanced directives List of other  physicians Hospitalizations, surgeries, and ER visits in previous 12 months Vitals Screenings to include cognitive, depression, and falls Referrals and appointments  In addition, I have reviewed and discussed with patient certain preventive protocols, quality metrics, and best practice recommendations. A written personalized care plan for preventive services as well as general preventive health recommendations were provided to patient.     Sydell Axon, LPN   29/56/2130   After Visit Summary: (In Person-Printed) AVS printed and given to the patient  Nurse Notes: None

## 2023-01-03 NOTE — Patient Instructions (Addendum)
Gloria Carr , Thank you for taking time to come for your Medicare Wellness Visit. I appreciate your ongoing commitment to your health goals. Please review the following plan we discussed and let me know if I can assist you in the future.   Referrals/Orders/Follow-Ups/Clinician Recommendations: Remember to update your Flu, Pneumonia and Covid vaccines.  This is a list of the screening recommended for you and due dates:  Health Maintenance  Topic Date Due   Pneumonia Vaccine (2 of 2 - PCV) 01/17/2015   Flu Shot  08/12/2022   COVID-19 Vaccine (9 - 2024-25 season) 09/12/2022   Medicare Annual Wellness Visit  01/03/2024   DTaP/Tdap/Td vaccine (4 - Td or Tdap) 12/10/2025   DEXA scan (bone density measurement)  Completed   Zoster (Shingles) Vaccine  Completed   HPV Vaccine  Aged Out    Advanced directives: (In Chart) A copy of your advanced directives are scanned into your chart should your provider ever need it.  Next Medicare Annual Wellness Visit scheduled for next year: Yes 01/09/24 @ 1:00  Managing Pain Without Opioids  Opioids are strong medicines used to treat moderate to severe pain. For some people, especially those who have long-term (chronic) pain, opioids may not be the best choice for pain management due to: Side effects like nausea, constipation, and sleepiness. The risk of addiction (opioid use disorder). The longer you take opioids, the greater your risk of addiction. Pain that lasts for more than 3 months is called chronic pain. Managing chronic pain usually requires more than one approach and is often provided by a team of health care providers working together (multidisciplinary approach). Pain management may be done at a pain management center or pain clinic. How to manage pain without the use of opioids Use non-opioid medicines Non-opioid medicines for pain may include: Over-the-counter or prescription non-steroidal anti-inflammatory drugs (NSAIDs). These may be the first  medicines used for pain. They work well for muscle and bone pain, and they reduce swelling. Acetaminophen. This over-the-counter medicine may work well for milder pain but not swelling. Antidepressants. These may be used to treat chronic pain. A certain type of antidepressant (tricyclics) is often used. These medicines are given in lower doses for pain than when used for depression. Anticonvulsants. These are usually used to treat seizures but may also reduce nerve (neuropathic) pain. Muscle relaxants. These relieve pain caused by sudden muscle tightening (spasms). You may also use a pain medicine that is applied to the skin as a patch, cream, or gel (topical analgesic), such as a numbing medicine. These may cause fewer side effects than medicines taken by mouth. Do certain therapies as directed Some therapies can help with pain management. They include: Physical therapy. You will do exercises to gain strength and flexibility. A physical therapist may teach you exercises to move and stretch parts of your body that are weak, stiff, or painful. You can learn these exercises at physical therapy visits and practice them at home. Physical therapy may also involve: Massage. Heat wraps or applying heat or cold to affected areas. Electrical signals that interrupt pain signals (transcutaneous electrical nerve stimulation, TENS). Weak lasers that reduce pain and swelling (low-level laser therapy). Signals from your body that help you learn to regulate pain (biofeedback). Occupational therapy. This helps you to learn ways to function at home and work with less pain. Recreational therapy. This involves trying new activities or hobbies, such as a physical activity or drawing. Mental health therapy, including: Cognitive behavioral therapy (CBT). This  helps you learn coping skills for dealing with pain. Acceptance and commitment therapy (ACT) to change the way you think and react to pain. Relaxation therapies,  including muscle relaxation exercises and mindfulness-based stress reduction. Pain management counseling. This may be individual, family, or group counseling.  Receive medical treatments Medical treatments for pain management include: Nerve block injections. These may include a pain blocker and anti-inflammatory medicines. You may have injections: Near the spine to relieve chronic back or neck pain. Into joints to relieve back or joint pain. Into nerve areas that supply a painful area to relieve body pain. Into muscles (trigger point injections) to relieve some painful muscle conditions. A medical device placed near your spine to help block pain signals and relieve nerve pain or chronic back pain (spinal cord stimulation device). Acupuncture. Follow these instructions at home Medicines Take over-the-counter and prescription medicines only as told by your health care provider. If you are taking pain medicine, ask your health care providers about possible side effects to watch out for. Do not drive or use heavy machinery while taking prescription opioid pain medicine. Lifestyle  Do not use drugs or alcohol to reduce pain. If you drink alcohol, limit how much you have to: 0-1 drink a day for women who are not pregnant. 0-2 drinks a day for men. Know how much alcohol is in a drink. In the [place] one drink equals one 12 oz bottle of beer (355 mL), one 5 oz glass of wine (148 mL), or one 1 oz glass of hard liquor (44 mL). Do not use any products that contain nicotine or tobacco. These products include cigarettes, chewing tobacco, and vaping devices, such as e-cigarettes. If you need help quitting, ask your health care provider. Eat a healthy diet and maintain a healthy weight. Poor diet and excess weight may make pain worse. Eat foods that are high in fiber. These include fresh fruits and vegetables, whole grains, and beans. Limit foods that are high in fat and processed sugars, such as fried and  sweet foods. Exercise regularly. Exercise lowers stress and may help relieve pain. Ask your health care provider what activities and exercises are safe for you. If your health care provider approves, join an exercise class that combines movement and stress reduction. Examples include yoga and tai chi. Get enough sleep. Lack of sleep may make pain worse. Lower stress as much as possible. Practice stress reduction techniques as told by your therapist. General instructions Work with all your pain management providers to find the treatments that work best for you. You are an important member of your pain management team. There are many things you can do to reduce pain on your own. Consider joining an online or in-person support group for people who have chronic pain. Keep all follow-up visits. This is important. Where to find more information You can find more information about managing pain without opioids from: American Academy of Pain Medicine: painmed.org Institute for Chronic Pain: instituteforchronicpain.org American Chronic Pain Association: theacpa.org Contact a health care provider if: You have side effects from pain medicine. Your pain gets worse or does not get better with treatments or home therapy. You are struggling with anxiety or depression. Summary Many types of pain can be managed without opioids. Chronic pain may respond better to pain management without opioids. Pain is best managed when you and a team of health care providers work together. Pain management without opioids may include non-opioid medicines, medical treatments, physical therapy, mental health therapy, and lifestyle  changes. Tell your health care providers if your pain gets worse or is not being managed well enough. This information is not intended to replace advice given to you by your health care provider. Make sure you discuss any questions you have with your health care provider. Document Revised: 04/09/2020  Document Reviewed: 04/09/2020 Elsevier Patient Education  2024 [employer].

## 2023-01-10 ENCOUNTER — Ambulatory Visit (INDEPENDENT_AMBULATORY_CARE_PROVIDER_SITE_OTHER): Payer: PPO | Admitting: Internal Medicine

## 2023-01-10 ENCOUNTER — Encounter: Payer: Self-pay | Admitting: Internal Medicine

## 2023-01-10 ENCOUNTER — Encounter: Payer: Self-pay | Admitting: *Deleted

## 2023-01-10 VITALS — BP 132/78 | HR 97 | Ht 59.0 in | Wt 104.0 lb

## 2023-01-10 DIAGNOSIS — I10 Essential (primary) hypertension: Secondary | ICD-10-CM | POA: Diagnosis not present

## 2023-01-10 DIAGNOSIS — K5904 Chronic idiopathic constipation: Secondary | ICD-10-CM

## 2023-01-10 DIAGNOSIS — E039 Hypothyroidism, unspecified: Secondary | ICD-10-CM | POA: Diagnosis not present

## 2023-01-10 DIAGNOSIS — F40298 Other specified phobia: Secondary | ICD-10-CM

## 2023-01-10 DIAGNOSIS — Z9181 History of falling: Secondary | ICD-10-CM | POA: Diagnosis not present

## 2023-01-10 DIAGNOSIS — R7303 Prediabetes: Secondary | ICD-10-CM | POA: Diagnosis not present

## 2023-01-10 DIAGNOSIS — M48062 Spinal stenosis, lumbar region with neurogenic claudication: Secondary | ICD-10-CM | POA: Diagnosis not present

## 2023-01-10 DIAGNOSIS — R202 Paresthesia of skin: Secondary | ICD-10-CM | POA: Diagnosis not present

## 2023-01-10 DIAGNOSIS — E7800 Pure hypercholesterolemia, unspecified: Secondary | ICD-10-CM

## 2023-01-10 DIAGNOSIS — E559 Vitamin D deficiency, unspecified: Secondary | ICD-10-CM | POA: Diagnosis not present

## 2023-01-10 MED ORDER — LEVOTHYROXINE SODIUM 50 MCG PO TABS: ORAL_TABLET | ORAL | 1 refills | Status: DC

## 2023-01-10 MED ORDER — DENOSUMAB 60 MG/ML ~~LOC~~ SOSY
60.0000 mg | PREFILLED_SYRINGE | Freq: Once | SUBCUTANEOUS | Status: AC
  Administered 2023-02-25: 60 mg via SUBCUTANEOUS

## 2023-01-10 MED ORDER — AMLODIPINE BESYLATE 5 MG PO TABS: ORAL_TABLET | ORAL | 1 refills | Status: DC

## 2023-01-10 NOTE — Progress Notes (Signed)
Subjective:  Patient ID: Gloria Carr, female    DOB: [birth year]  Age: 89 y.o. MRN: [id number]  CC: The primary encounter diagnosis was Essential hypertension. Diagnoses of Acquired hypothyroidism, Pure hypercholesterolemia, Prediabetes, Vitamin D deficiency, Paresthesias, Fear of falling, Spinal stenosis of lumbar region with neurogenic claudication, Chronic idiopathic constipation, and History of fall within past 90 days were also pertinent to this visit.   HPI Gloria Carr presents for  Chief Complaint  Patient presents with   Medical Management of Chronic Issues    6 month follow up    1) left hip pain:  evaluated in ED on Nov 29  after presenting to Orthopedic clinic with hip pain following a fall.  CT done and fracture ruled out 9see below:  " Left hip intact without fracture or dislocation. Subacute to chronic nondisplaced fracture of the distal sacrum, new since 09/16/2021 (series 7, image 13). Old healed left superior and inferior pubic rami fractures. Moderate degenerative changes of the left hip with chondrocalcinosis. Moderate-severe arthropathy of the pubic symphysis, also with chondrocalcinosis. No lytic or sclerotic bone lesion."   2) She has been having chronic bilateral back pain  in the setting of lumbar spinal stenosis, that did not respond to a steroid injection by [facility].  Has follow up this week [facility]  3 rd of 4th visit.  Currently taking tramadol  twice daily , and   2 tylenol  at bedtime.  Has not increased doses ,  due to loose sttols.  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   Sees eye doctor tomorrow  4) Finger tips have become numb , subjectively . Fine motor skills still intact. .  Can feel hot and cold.   5) Annoying enlarging lump on  tip of right thumb . Has been referred to specialist but had to postpone first visit.   Outpatient Medications  Prior to Visit  Medication Sig Dispense Refill   atorvastatin (LIPITOR) 20 MG tablet Take 1 tablet (20 mg total) by mouth daily. 90 tablet 3   Cholecalciferol (VITAMIN D3) 50 MCG (2000 UT) capsule Take 2,000 Units by mouth daily.     denosumab (PROLIA) 60 MG/ML SOSY injection Inject 60 mg into the skin every 6 (six) months.     diclofenac Sodium (VOLTAREN) 1 % GEL Apply 2 g topically 4 (four) times daily.     latanoprost (XALATAN) 0.005 % ophthalmic solution INSTILL 1 DROP IN THE LEFT EYE AT NIGHT  5   Multiple Vitamins-Minerals (PRESERVISION AREDS 2 PO) Take by mouth.     Propylene Glycol (SYSTANE BALANCE OP) Apply to eye.     traMADol (ULTRAM) 50 MG tablet Take 50 mg by mouth 3 (three) times daily as needed.     amLODipine (NORVASC) 5 MG tablet TAKE 1 TABLET BY MOUTH DAILY. DOSE INCREASE. 90 tablet 1   levothyroxine (SYNTHROID) 50 MCG tablet TAKE 1 TABLET EVERY DAY ON EMPTY STOMACHWITH A GLASS OF WATER AT LEAST 30-60 MINBEFORE BREAKFAST 90 tablet 0   ondansetron (ZOFRAN) 4 MG tablet Take 1 tablet (4 mg total) by mouth every 8 (eight) hours as needed for nausea or vomiting. (Patient not taking: Reported on 01/10/2023) 20 tablet 0   Facility-Administered Medications Prior to Visit  Medication Dose Route Frequency Provider Last Rate Last Admin   [START ON 02/23/2023] denosumab (PROLIA) injection 60 mg  60 mg Subcutaneous Once Sherlene Shams, MD        Review of Systems;  Patient denies headache, fevers, malaise, unintentional weight loss, skin rash, eye pain, sinus congestion and sinus pain, sore throat, dysphagia,  hemoptysis , cough, dyspnea, wheezing, chest pain, palpitations, orthopnea, edema, abdominal pain, nausea, melena, diarrhea, constipation, flank pain, dysuria, hematuria, urinary  Frequency, nocturia, numbness, tingling, seizures,  Focal weakness, Loss of consciousness,  Tremor, insomnia, depression, anxiety, and suicidal ideation.      Objective:  BP 132/78   Pulse 97   Ht 4'  11" (1.499 m)   Wt 104 lb (47.2 kg)   LMP  (LMP Unknown)   SpO2 96%   BMI 21.01 kg/m   BP Readings from Last 3 Encounters:  01/10/23 132/78  01/03/23 124/70  12/10/22 138/77    Wt Readings from Last 3 Encounters:  01/10/23 104 lb (47.2 kg)  01/03/23 103 lb 4 oz (46.8 kg)  12/10/22 108 lb (49 kg)    Physical Exam  Lab Results  Component Value Date   HGBA1C 6.2 01/10/2023   HGBA1C 5.9 03/26/2022   HGBA1C 5.9 06/29/2021    Lab Results  Component Value Date   CREATININE 0.66 01/10/2023   CREATININE 0.68 03/26/2022   CREATININE 0.66 12/31/2021    Lab Results  Component Value Date   WBC 7.8 01/10/2023   HGB 13.7 01/10/2023   HCT 41.5 01/10/2023   PLT 285.0 01/10/2023   GLUCOSE 75 01/10/2023   CHOL 155 01/10/2023   TRIG 71.0 01/10/2023   HDL 81.70 01/10/2023   LDLDIRECT 47.0 01/10/2023   LDLCALC 60 01/10/2023   ALT 24 01/10/2023   AST 27 01/10/2023   NA 140 01/10/2023   K 4.1 01/10/2023   CL 102 01/10/2023   CREATININE 0.66 01/10/2023   BUN 17 01/10/2023   CO2 28 01/10/2023   TSH 1.65 01/10/2023   HGBA1C 6.2 01/10/2023   MICROALBUR 1.1 03/26/2022    CT Hip Left Wo Contrast Result Date: 12/10/2022 CLINICAL DATA:  Hip trauma, fracture suspected, xray done EXAM: CT OF THE LEFT HIP WITHOUT CONTRAST TECHNIQUE: Multidetector CT imaging of the left hip was performed according to the standard protocol. Multiplanar CT image reconstructions were also generated. RADIATION DOSE REDUCTION: This exam was performed according to the departmental dose-optimization program which includes automated exposure control, adjustment of the mA and/or kV according to patient size and/or use of iterative reconstruction technique. COMPARISON:  08/31/2021, 09/16/2021 FINDINGS: Bones/Joint/Cartilage Left hip intact without fracture or dislocation. Subacute to chronic nondisplaced fracture of the distal sacrum, new since 09/16/2021 (series 7, image 13). Old healed left superior and inferior  pubic rami fractures. Moderate degenerative changes of the left hip with chondrocalcinosis. Moderate-severe arthropathy of the pubic symphysis, also with chondrocalcinosis. No lytic or sclerotic bone lesion. Ligaments Suboptimally assessed by CT. Muscles and Tendons No acute musculotendinous abnormality by CT. Soft tissues No soft tissue swelling or hematoma about the left hip. Sigmoid diverticulosis. IMPRESSION: 1. Subacute to chronic nondisplaced fracture of the distal sacrum. 2. Old healed left superior and inferior pubic rami fractures. 3. No acute fracture or dislocation of the left hip. 4. Moderate degenerative changes of the left hip. Electronically Signed   By: Duanne Guess D.O.   On: 12/10/2022 13:47    Assessment & Plan:  .Essential hypertension -     Comprehensive metabolic panel  Acquired hypothyroidism Assessment & Plan: Thyroid function is WNL on current dose.  No current changes needed.  Lab Results  Component Value Date   TSH 1.65 01/10/2023     Orders: -     TSH  Pure hypercholesterolemia -     Lipid panel -     LDL cholesterol, direct -     CBC with Differential/Platelet  Prediabetes -     Comprehensive metabolic panel -     Hemoglobin A1c  Vitamin D deficiency -     VITAMIN D 25 Hydroxy (Vit-D Deficiency, Fractures)  Paresthesias -     B12 and Folate Panel  Fear of falling Assessment & Plan: 2 falls in the last 3 months,  PT ordered using a walker   Orders: -     Ambulatory referral to Physical Therapy  Spinal stenosis of lumbar region with neurogenic claudication Assessment & Plan: She has chronic pain that has not been responding to orthopedic treatments.  Advised so to call for referral to [facility] for ESI if follow up with Orthopedics  is not effective.   Orders: -     Ambulatory referral to Physical Therapy  Chronic idiopathic constipation Assessment & Plan: Her preoccupation with a need for daily formed BM has complicated her pain  management.  Recommend stopping benefiber and trying 100 mg stool softener.    History of fall within past 90 days Assessment & Plan: Referring to [facility] for  gait instability and strengthening    Other orders -     amLODIPine Besylate; TAKE 1 TABLET BY MOUTH DAILY. DOSE INCREASE.  Dispense: 90 tablet; Refill: 1 -     Levothyroxine Sodium; TAKE 1 TABLET EVERY DAY ON EMPTY STOMACHWITH A GLASS OF WATER AT LEAST 30-60 MINBEFORE BREAKFAST  Dispense: 90 tablet; Refill: 1     I provided 40 minutes of face-to-face time during this encounter reviewing patient's last visit with me, patient's  most recent visit with orthopedics,  physiatry, , prior  surgical and non surgical procedures, previous  labs and imaging studies, counseling on currently addressed issues,  and post visit ordering to diagnostics and therapeutics .   Follow-up: Return in about 6 months (around 07/11/2023).   Sherlene Shams, MD

## 2023-01-10 NOTE — Patient Instructions (Addendum)
Stop the Benefiber and take 100  mg colace  (stool softener) at bedtime    Continue tramadol , add a 3rd dose one hour before  at bedtime .  You can continue voltaren gel and tylenol both at night and during the day    If the next visit with [facility] doesn't help, please call to request the referral [facility]  Physical therpay referral made for [facility]  to help with your leg weakness and falls

## 2023-01-10 NOTE — Addendum Note (Signed)
Addended by: Warden Fillers on: 01/10/2023 01:30 PM   Modules accepted: Orders

## 2023-01-10 NOTE — Assessment & Plan Note (Signed)
2 falls in the last 3 months,  PT ordered using a walker

## 2023-01-11 ENCOUNTER — Encounter: Payer: Self-pay | Admitting: Internal Medicine

## 2023-01-11 DIAGNOSIS — H353221 Exudative age-related macular degeneration, left eye, with active choroidal neovascularization: Secondary | ICD-10-CM | POA: Diagnosis not present

## 2023-01-11 LAB — LIPID PANEL
Cholesterol: 155 mg/dL (ref 0–200)
HDL: 81.7 mg/dL (ref >39.00)
LDL Cholesterol: 60 mg/dL (ref 0–99)
NonHDL: 73.77
Total CHOL/HDL Ratio: 2
Triglycerides: 71 mg/dL (ref 0.0–149.0)
VLDL: 14.2 mg/dL (ref 0.0–40.0)

## 2023-01-11 LAB — COMPREHENSIVE METABOLIC PANEL
ALT: 24 U/L (ref 0–35)
AST: 27 U/L (ref 0–37)
Albumin: 3.9 g/dL (ref 3.5–5.2)
Alkaline Phosphatase: 53 U/L (ref 39–117)
BUN: 17 mg/dL (ref 6–23)
CO2: 28 meq/L (ref 19–32)
Calcium: 9.3 mg/dL (ref 8.4–10.5)
Chloride: 102 meq/L (ref 96–112)
Creatinine, Ser: 0.66 mg/dL (ref 0.40–1.20)
GFR: 75.44 mL/min (ref >60.00)
Glucose, Bld: 75 mg/dL (ref 70–99)
Potassium: 4.1 meq/L (ref 3.5–5.1)
Sodium: 140 meq/L (ref 135–145)
Total Bilirubin: 0.9 mg/dL (ref 0.2–1.2)
Total Protein: 6.4 g/dL (ref 6.0–8.3)

## 2023-01-11 LAB — B12 AND FOLATE PANEL
Folate: 14.5 ng/mL (ref >5.9)
Vitamin B-12: 294 pg/mL (ref 211–911)

## 2023-01-11 LAB — CBC WITH DIFFERENTIAL/PLATELET
Basophils Absolute: 0.1 10*3/uL (ref 0.0–0.1)
Basophils Relative: 0.8 % (ref 0.0–3.0)
Eosinophils Absolute: 0 10*3/uL (ref 0.0–0.7)
Eosinophils Relative: 0.4 % (ref 0.0–5.0)
HCT: 41.5 % (ref 36.0–46.0)
Hemoglobin: 13.7 g/dL (ref 12.0–15.0)
Lymphocytes Relative: 27.6 % (ref 12.0–46.0)
Lymphs Abs: 2.1 10*3/uL (ref 0.7–4.0)
MCHC: 33.2 g/dL (ref 30.0–36.0)
MCV: 94.3 fL (ref 78.0–100.0)
Monocytes Absolute: 0.8 10*3/uL (ref 0.1–1.0)
Monocytes Relative: 10.1 % (ref 3.0–12.0)
Neutro Abs: 4.7 10*3/uL (ref 1.4–7.7)
Neutrophils Relative %: 61.1 % (ref 43.0–77.0)
Platelets: 285 10*3/uL (ref 150.0–400.0)
RBC: 4.4 Mil/uL (ref 3.87–5.11)
RDW: 15.4 % (ref 11.5–15.5)
WBC: 7.8 10*3/uL (ref 4.0–10.5)

## 2023-01-11 LAB — TSH: TSH: 1.65 u[IU]/mL (ref 0.35–5.50)

## 2023-01-11 LAB — HEMOGLOBIN A1C: Hgb A1c MFr Bld: 6.2 % (ref 4.6–6.5)

## 2023-01-11 LAB — LDL CHOLESTEROL, DIRECT: Direct LDL: 47 mg/dL

## 2023-01-11 LAB — VITAMIN D 25 HYDROXY (VIT D DEFICIENCY, FRACTURES): VITD: 27.26 ng/mL — ABNORMAL LOW (ref 30.00–100.00)

## 2023-01-11 NOTE — Assessment & Plan Note (Signed)
Referring to [facility] for  gait instability and strengthening

## 2023-01-11 NOTE — Assessment & Plan Note (Signed)
 She has chronic pain that has not been responding to orthopedic treatments.  Advised so to call for referral to [facility] for ESI if follow up with Orthopedics  is not effective.

## 2023-01-11 NOTE — Assessment & Plan Note (Signed)
Her preoccupation with a need for daily formed BM has complicated her pain management.  Recommend stopping benefiber and trying 100 mg stool softener.

## 2023-01-11 NOTE — Assessment & Plan Note (Signed)
Thyroid function is WNL on current dose.  No current changes needed.   Lab Results  Component Value Date   TSH 1.65 01/10/2023

## 2023-01-13 ENCOUNTER — Telehealth: Payer: Self-pay

## 2023-01-13 DIAGNOSIS — M5430 Sciatica, unspecified side: Secondary | ICD-10-CM

## 2023-01-13 DIAGNOSIS — M47896 Other spondylosis, lumbar region: Secondary | ICD-10-CM | POA: Diagnosis not present

## 2023-01-13 DIAGNOSIS — M51369 Other intervertebral disc degeneration, lumbar region without mention of lumbar back pain or lower extremity pain: Secondary | ICD-10-CM | POA: Diagnosis not present

## 2023-01-13 DIAGNOSIS — G8929 Other chronic pain: Secondary | ICD-10-CM

## 2023-01-13 DIAGNOSIS — M48062 Spinal stenosis, lumbar region with neurogenic claudication: Secondary | ICD-10-CM

## 2023-01-13 NOTE — Telephone Encounter (Signed)
 Prolia VOB initiated via [email or web address]  Next Prolia inj DUE: 02/21/23

## 2023-01-13 NOTE — Telephone Encounter (Signed)
 Copied from CRM [id number]. Topic: Referral - Request for Referral >> Jan 13, 2023  4:13 PM Drema MATSU wrote: Did the patient discuss referral with their provider in the last year? Yes she stated her pcp recommended referral on 12/30 visit  (If No - schedule appointment) (If Yes - send message)  Appointment offered? Yes  Type of order/referral and detailed reason for visit: Pt is having back pain and medications has been upsetting her stomach. Pt wants to be referred to Dr. [facility] Orthopedic Surgeon  Preference of office, provider, location: [facility], [facility]  If referral order, have you been seen by this specialty before? Yes Patient does not remember  (If Yes, this issue or another issue? When? Where?  Can we respond through MyChart? No

## 2023-01-14 NOTE — Telephone Encounter (Signed)
 Pt is aware that referral has been palced.

## 2023-01-19 ENCOUNTER — Other Ambulatory Visit (HOSPITAL_COMMUNITY): Payer: Self-pay

## 2023-01-19 NOTE — Telephone Encounter (Signed)
 Pt ready for scheduling for PROLIA  on or after : 02/21/23  Out-of-pocket cost due at time of visit: $323  Number of injection/visits approved: ---  Primary: [employer] Prolia  co-insurance: 20% Admin fee co-insurance: 0%  Secondary: --- Prolia  co-insurance:  Admin fee co-insurance:   Medical Benefit Details: Date Benefits were checked: 01/14/23 Deductible: NO/ Coinsurance: 20%/ Admin Fee: 0%  Prior Auth: N/A PA# Expiration Date:   # of doses approved:  Pharmacy benefit: Copay $250 If patient wants fill through the pharmacy benefit please send prescription to: [employer], and include estimated need by date in rx notes. Pharmacy will ship medication directly to the office.  Patient NOT eligible for Prolia  Copay Card. Copay Card can make patient's cost as little as $25. Link to apply: [email or web address]://www.amgensupportplus.com/copay  ** This summary of benefits is an estimation of the patient's out-of-pocket cost. Exact cost may very based on individual plan coverage.

## 2023-01-25 DIAGNOSIS — R296 Repeated falls: Secondary | ICD-10-CM | POA: Diagnosis not present

## 2023-01-25 DIAGNOSIS — R2681 Unsteadiness on feet: Secondary | ICD-10-CM | POA: Diagnosis not present

## 2023-01-25 DIAGNOSIS — M5459 Other low back pain: Secondary | ICD-10-CM | POA: Diagnosis not present

## 2023-01-27 DIAGNOSIS — M5416 Radiculopathy, lumbar region: Secondary | ICD-10-CM | POA: Diagnosis not present

## 2023-01-27 DIAGNOSIS — M48062 Spinal stenosis, lumbar region with neurogenic claudication: Secondary | ICD-10-CM | POA: Diagnosis not present

## 2023-01-28 ENCOUNTER — Telehealth: Payer: Self-pay

## 2023-01-28 NOTE — Telephone Encounter (Signed)
Received outpatient clinic PT eval and plan of treatment from [facility] at [place]. Placed in provider folder for signature.

## 2023-02-02 DIAGNOSIS — M5459 Other low back pain: Secondary | ICD-10-CM | POA: Diagnosis not present

## 2023-02-02 DIAGNOSIS — R2681 Unsteadiness on feet: Secondary | ICD-10-CM | POA: Diagnosis not present

## 2023-02-02 DIAGNOSIS — R296 Repeated falls: Secondary | ICD-10-CM | POA: Diagnosis not present

## 2023-02-03 DIAGNOSIS — M48062 Spinal stenosis, lumbar region with neurogenic claudication: Secondary | ICD-10-CM | POA: Diagnosis not present

## 2023-02-03 DIAGNOSIS — M5459 Other low back pain: Secondary | ICD-10-CM | POA: Diagnosis not present

## 2023-02-03 DIAGNOSIS — R296 Repeated falls: Secondary | ICD-10-CM | POA: Diagnosis not present

## 2023-02-03 DIAGNOSIS — R2681 Unsteadiness on feet: Secondary | ICD-10-CM | POA: Diagnosis not present

## 2023-02-03 DIAGNOSIS — M5416 Radiculopathy, lumbar region: Secondary | ICD-10-CM | POA: Diagnosis not present

## 2023-02-04 DIAGNOSIS — R2681 Unsteadiness on feet: Secondary | ICD-10-CM | POA: Diagnosis not present

## 2023-02-04 DIAGNOSIS — R296 Repeated falls: Secondary | ICD-10-CM | POA: Diagnosis not present

## 2023-02-04 DIAGNOSIS — M5459 Other low back pain: Secondary | ICD-10-CM | POA: Diagnosis not present

## 2023-02-07 DIAGNOSIS — M5459 Other low back pain: Secondary | ICD-10-CM | POA: Diagnosis not present

## 2023-02-07 DIAGNOSIS — R2681 Unsteadiness on feet: Secondary | ICD-10-CM | POA: Diagnosis not present

## 2023-02-07 DIAGNOSIS — R296 Repeated falls: Secondary | ICD-10-CM | POA: Diagnosis not present

## 2023-02-09 DIAGNOSIS — R2681 Unsteadiness on feet: Secondary | ICD-10-CM | POA: Diagnosis not present

## 2023-02-09 DIAGNOSIS — M5459 Other low back pain: Secondary | ICD-10-CM | POA: Diagnosis not present

## 2023-02-09 DIAGNOSIS — R296 Repeated falls: Secondary | ICD-10-CM | POA: Diagnosis not present

## 2023-02-09 DIAGNOSIS — Z9181 History of falling: Secondary | ICD-10-CM | POA: Diagnosis not present

## 2023-02-09 DIAGNOSIS — M6281 Muscle weakness (generalized): Secondary | ICD-10-CM | POA: Diagnosis not present

## 2023-02-09 DIAGNOSIS — R2689 Other abnormalities of gait and mobility: Secondary | ICD-10-CM | POA: Diagnosis not present

## 2023-02-11 DIAGNOSIS — R296 Repeated falls: Secondary | ICD-10-CM | POA: Diagnosis not present

## 2023-02-11 DIAGNOSIS — R2681 Unsteadiness on feet: Secondary | ICD-10-CM | POA: Diagnosis not present

## 2023-02-11 DIAGNOSIS — M5459 Other low back pain: Secondary | ICD-10-CM | POA: Diagnosis not present

## 2023-02-14 DIAGNOSIS — M5459 Other low back pain: Secondary | ICD-10-CM | POA: Diagnosis not present

## 2023-02-14 DIAGNOSIS — R2681 Unsteadiness on feet: Secondary | ICD-10-CM | POA: Diagnosis not present

## 2023-02-14 DIAGNOSIS — R296 Repeated falls: Secondary | ICD-10-CM | POA: Diagnosis not present

## 2023-02-15 DIAGNOSIS — H353221 Exudative age-related macular degeneration, left eye, with active choroidal neovascularization: Secondary | ICD-10-CM | POA: Diagnosis not present

## 2023-02-16 DIAGNOSIS — R2689 Other abnormalities of gait and mobility: Secondary | ICD-10-CM | POA: Diagnosis not present

## 2023-02-16 DIAGNOSIS — R2681 Unsteadiness on feet: Secondary | ICD-10-CM | POA: Diagnosis not present

## 2023-02-16 DIAGNOSIS — R296 Repeated falls: Secondary | ICD-10-CM | POA: Diagnosis not present

## 2023-02-16 DIAGNOSIS — Z9181 History of falling: Secondary | ICD-10-CM | POA: Diagnosis not present

## 2023-02-16 DIAGNOSIS — M5459 Other low back pain: Secondary | ICD-10-CM | POA: Diagnosis not present

## 2023-02-16 DIAGNOSIS — M6281 Muscle weakness (generalized): Secondary | ICD-10-CM | POA: Diagnosis not present

## 2023-02-17 DIAGNOSIS — R296 Repeated falls: Secondary | ICD-10-CM | POA: Diagnosis not present

## 2023-02-17 DIAGNOSIS — M5459 Other low back pain: Secondary | ICD-10-CM | POA: Diagnosis not present

## 2023-02-17 DIAGNOSIS — R2681 Unsteadiness on feet: Secondary | ICD-10-CM | POA: Diagnosis not present

## 2023-02-18 DIAGNOSIS — M6281 Muscle weakness (generalized): Secondary | ICD-10-CM | POA: Diagnosis not present

## 2023-02-18 DIAGNOSIS — Z9181 History of falling: Secondary | ICD-10-CM | POA: Diagnosis not present

## 2023-02-18 DIAGNOSIS — R2689 Other abnormalities of gait and mobility: Secondary | ICD-10-CM | POA: Diagnosis not present

## 2023-02-22 DIAGNOSIS — M6281 Muscle weakness (generalized): Secondary | ICD-10-CM | POA: Diagnosis not present

## 2023-02-22 DIAGNOSIS — Z9181 History of falling: Secondary | ICD-10-CM | POA: Diagnosis not present

## 2023-02-22 DIAGNOSIS — R2689 Other abnormalities of gait and mobility: Secondary | ICD-10-CM | POA: Diagnosis not present

## 2023-02-23 DIAGNOSIS — R2681 Unsteadiness on feet: Secondary | ICD-10-CM | POA: Diagnosis not present

## 2023-02-23 DIAGNOSIS — M5459 Other low back pain: Secondary | ICD-10-CM | POA: Diagnosis not present

## 2023-02-23 DIAGNOSIS — R296 Repeated falls: Secondary | ICD-10-CM | POA: Diagnosis not present

## 2023-02-24 ENCOUNTER — Ambulatory Visit: Payer: PPO

## 2023-02-24 DIAGNOSIS — R2689 Other abnormalities of gait and mobility: Secondary | ICD-10-CM | POA: Diagnosis not present

## 2023-02-24 DIAGNOSIS — R296 Repeated falls: Secondary | ICD-10-CM | POA: Diagnosis not present

## 2023-02-24 DIAGNOSIS — M6281 Muscle weakness (generalized): Secondary | ICD-10-CM | POA: Diagnosis not present

## 2023-02-24 DIAGNOSIS — M5459 Other low back pain: Secondary | ICD-10-CM | POA: Diagnosis not present

## 2023-02-24 DIAGNOSIS — Z9181 History of falling: Secondary | ICD-10-CM | POA: Diagnosis not present

## 2023-02-24 DIAGNOSIS — R2681 Unsteadiness on feet: Secondary | ICD-10-CM | POA: Diagnosis not present

## 2023-02-25 ENCOUNTER — Ambulatory Visit (INDEPENDENT_AMBULATORY_CARE_PROVIDER_SITE_OTHER): Payer: PPO

## 2023-02-25 DIAGNOSIS — M5459 Other low back pain: Secondary | ICD-10-CM | POA: Diagnosis not present

## 2023-02-25 DIAGNOSIS — M810 Age-related osteoporosis without current pathological fracture: Secondary | ICD-10-CM

## 2023-02-25 DIAGNOSIS — R296 Repeated falls: Secondary | ICD-10-CM | POA: Diagnosis not present

## 2023-02-25 DIAGNOSIS — R2681 Unsteadiness on feet: Secondary | ICD-10-CM | POA: Diagnosis not present

## 2023-02-25 MED ORDER — DENOSUMAB 60 MG/ML ~~LOC~~ SOSY: 60.0000 mg | PREFILLED_SYRINGE | Freq: Once | SUBCUTANEOUS | Status: DC

## 2023-02-25 NOTE — Progress Notes (Signed)
Patient presented for Prolia injection in her left arm, patient voiced no concerns nor showed any signs of distress during injection

## 2023-02-28 DIAGNOSIS — R2681 Unsteadiness on feet: Secondary | ICD-10-CM | POA: Diagnosis not present

## 2023-02-28 DIAGNOSIS — R296 Repeated falls: Secondary | ICD-10-CM | POA: Diagnosis not present

## 2023-02-28 DIAGNOSIS — M5459 Other low back pain: Secondary | ICD-10-CM | POA: Diagnosis not present

## 2023-03-01 DIAGNOSIS — M6281 Muscle weakness (generalized): Secondary | ICD-10-CM | POA: Diagnosis not present

## 2023-03-01 DIAGNOSIS — R2689 Other abnormalities of gait and mobility: Secondary | ICD-10-CM | POA: Diagnosis not present

## 2023-03-01 DIAGNOSIS — Z9181 History of falling: Secondary | ICD-10-CM | POA: Diagnosis not present

## 2023-03-02 DIAGNOSIS — R296 Repeated falls: Secondary | ICD-10-CM | POA: Diagnosis not present

## 2023-03-02 DIAGNOSIS — R2681 Unsteadiness on feet: Secondary | ICD-10-CM | POA: Diagnosis not present

## 2023-03-02 DIAGNOSIS — M5459 Other low back pain: Secondary | ICD-10-CM | POA: Diagnosis not present

## 2023-03-04 DIAGNOSIS — R296 Repeated falls: Secondary | ICD-10-CM | POA: Diagnosis not present

## 2023-03-04 DIAGNOSIS — M5459 Other low back pain: Secondary | ICD-10-CM | POA: Diagnosis not present

## 2023-03-04 DIAGNOSIS — Z9181 History of falling: Secondary | ICD-10-CM | POA: Diagnosis not present

## 2023-03-04 DIAGNOSIS — R2689 Other abnormalities of gait and mobility: Secondary | ICD-10-CM | POA: Diagnosis not present

## 2023-03-04 DIAGNOSIS — M6281 Muscle weakness (generalized): Secondary | ICD-10-CM | POA: Diagnosis not present

## 2023-03-04 DIAGNOSIS — R2681 Unsteadiness on feet: Secondary | ICD-10-CM | POA: Diagnosis not present

## 2023-03-07 DIAGNOSIS — M5459 Other low back pain: Secondary | ICD-10-CM | POA: Diagnosis not present

## 2023-03-07 DIAGNOSIS — R2681 Unsteadiness on feet: Secondary | ICD-10-CM | POA: Diagnosis not present

## 2023-03-07 DIAGNOSIS — R296 Repeated falls: Secondary | ICD-10-CM | POA: Diagnosis not present

## 2023-03-09 DIAGNOSIS — M5459 Other low back pain: Secondary | ICD-10-CM | POA: Diagnosis not present

## 2023-03-09 DIAGNOSIS — R296 Repeated falls: Secondary | ICD-10-CM | POA: Diagnosis not present

## 2023-03-09 DIAGNOSIS — R2681 Unsteadiness on feet: Secondary | ICD-10-CM | POA: Diagnosis not present

## 2023-03-10 DIAGNOSIS — R296 Repeated falls: Secondary | ICD-10-CM | POA: Diagnosis not present

## 2023-03-10 DIAGNOSIS — R2689 Other abnormalities of gait and mobility: Secondary | ICD-10-CM | POA: Diagnosis not present

## 2023-03-10 DIAGNOSIS — M5459 Other low back pain: Secondary | ICD-10-CM | POA: Diagnosis not present

## 2023-03-10 DIAGNOSIS — Z9181 History of falling: Secondary | ICD-10-CM | POA: Diagnosis not present

## 2023-03-10 DIAGNOSIS — R2681 Unsteadiness on feet: Secondary | ICD-10-CM | POA: Diagnosis not present

## 2023-03-10 DIAGNOSIS — M6281 Muscle weakness (generalized): Secondary | ICD-10-CM | POA: Diagnosis not present

## 2023-03-11 DIAGNOSIS — R2689 Other abnormalities of gait and mobility: Secondary | ICD-10-CM | POA: Diagnosis not present

## 2023-03-11 DIAGNOSIS — M6281 Muscle weakness (generalized): Secondary | ICD-10-CM | POA: Diagnosis not present

## 2023-03-11 DIAGNOSIS — Z9181 History of falling: Secondary | ICD-10-CM | POA: Diagnosis not present

## 2023-03-14 DIAGNOSIS — R2681 Unsteadiness on feet: Secondary | ICD-10-CM | POA: Diagnosis not present

## 2023-03-14 DIAGNOSIS — R296 Repeated falls: Secondary | ICD-10-CM | POA: Diagnosis not present

## 2023-03-14 DIAGNOSIS — M5459 Other low back pain: Secondary | ICD-10-CM | POA: Diagnosis not present

## 2023-03-15 DIAGNOSIS — Z9181 History of falling: Secondary | ICD-10-CM | POA: Diagnosis not present

## 2023-03-15 DIAGNOSIS — M6281 Muscle weakness (generalized): Secondary | ICD-10-CM | POA: Diagnosis not present

## 2023-03-15 DIAGNOSIS — R2689 Other abnormalities of gait and mobility: Secondary | ICD-10-CM | POA: Diagnosis not present

## 2023-03-16 DIAGNOSIS — M48062 Spinal stenosis, lumbar region with neurogenic claudication: Secondary | ICD-10-CM | POA: Diagnosis not present

## 2023-03-16 DIAGNOSIS — R2681 Unsteadiness on feet: Secondary | ICD-10-CM | POA: Diagnosis not present

## 2023-03-16 DIAGNOSIS — M5416 Radiculopathy, lumbar region: Secondary | ICD-10-CM | POA: Diagnosis not present

## 2023-03-16 DIAGNOSIS — M5459 Other low back pain: Secondary | ICD-10-CM | POA: Diagnosis not present

## 2023-03-16 DIAGNOSIS — R296 Repeated falls: Secondary | ICD-10-CM | POA: Diagnosis not present

## 2023-03-17 DIAGNOSIS — Z9181 History of falling: Secondary | ICD-10-CM | POA: Diagnosis not present

## 2023-03-17 DIAGNOSIS — R2689 Other abnormalities of gait and mobility: Secondary | ICD-10-CM | POA: Diagnosis not present

## 2023-03-17 DIAGNOSIS — M6281 Muscle weakness (generalized): Secondary | ICD-10-CM | POA: Diagnosis not present

## 2023-03-18 DIAGNOSIS — Z08 Encounter for follow-up examination after completed treatment for malignant neoplasm: Secondary | ICD-10-CM | POA: Diagnosis not present

## 2023-03-18 DIAGNOSIS — M5459 Other low back pain: Secondary | ICD-10-CM | POA: Diagnosis not present

## 2023-03-18 DIAGNOSIS — Z85828 Personal history of other malignant neoplasm of skin: Secondary | ICD-10-CM | POA: Diagnosis not present

## 2023-03-18 DIAGNOSIS — D2271 Melanocytic nevi of right lower limb, including hip: Secondary | ICD-10-CM | POA: Diagnosis not present

## 2023-03-18 DIAGNOSIS — D225 Melanocytic nevi of trunk: Secondary | ICD-10-CM | POA: Diagnosis not present

## 2023-03-18 DIAGNOSIS — L821 Other seborrheic keratosis: Secondary | ICD-10-CM | POA: Diagnosis not present

## 2023-03-18 DIAGNOSIS — D2262 Melanocytic nevi of left upper limb, including shoulder: Secondary | ICD-10-CM | POA: Diagnosis not present

## 2023-03-18 DIAGNOSIS — D2272 Melanocytic nevi of left lower limb, including hip: Secondary | ICD-10-CM | POA: Diagnosis not present

## 2023-03-18 DIAGNOSIS — R296 Repeated falls: Secondary | ICD-10-CM | POA: Diagnosis not present

## 2023-03-18 DIAGNOSIS — R2681 Unsteadiness on feet: Secondary | ICD-10-CM | POA: Diagnosis not present

## 2023-03-18 DIAGNOSIS — D2261 Melanocytic nevi of right upper limb, including shoulder: Secondary | ICD-10-CM | POA: Diagnosis not present

## 2023-03-21 DIAGNOSIS — R2681 Unsteadiness on feet: Secondary | ICD-10-CM | POA: Diagnosis not present

## 2023-03-21 DIAGNOSIS — R296 Repeated falls: Secondary | ICD-10-CM | POA: Diagnosis not present

## 2023-03-21 DIAGNOSIS — M5459 Other low back pain: Secondary | ICD-10-CM | POA: Diagnosis not present

## 2023-03-22 DIAGNOSIS — R2689 Other abnormalities of gait and mobility: Secondary | ICD-10-CM | POA: Diagnosis not present

## 2023-03-22 DIAGNOSIS — Z9181 History of falling: Secondary | ICD-10-CM | POA: Diagnosis not present

## 2023-03-22 DIAGNOSIS — M6281 Muscle weakness (generalized): Secondary | ICD-10-CM | POA: Diagnosis not present

## 2023-03-23 DIAGNOSIS — M5459 Other low back pain: Secondary | ICD-10-CM | POA: Diagnosis not present

## 2023-03-23 DIAGNOSIS — R2681 Unsteadiness on feet: Secondary | ICD-10-CM | POA: Diagnosis not present

## 2023-03-23 DIAGNOSIS — R296 Repeated falls: Secondary | ICD-10-CM | POA: Diagnosis not present

## 2023-03-24 DIAGNOSIS — R2681 Unsteadiness on feet: Secondary | ICD-10-CM | POA: Diagnosis not present

## 2023-03-24 DIAGNOSIS — M5459 Other low back pain: Secondary | ICD-10-CM | POA: Diagnosis not present

## 2023-03-24 DIAGNOSIS — R296 Repeated falls: Secondary | ICD-10-CM | POA: Diagnosis not present

## 2023-03-25 DIAGNOSIS — R2689 Other abnormalities of gait and mobility: Secondary | ICD-10-CM | POA: Diagnosis not present

## 2023-03-25 DIAGNOSIS — Z9181 History of falling: Secondary | ICD-10-CM | POA: Diagnosis not present

## 2023-03-25 DIAGNOSIS — H353221 Exudative age-related macular degeneration, left eye, with active choroidal neovascularization: Secondary | ICD-10-CM | POA: Diagnosis not present

## 2023-03-25 DIAGNOSIS — M6281 Muscle weakness (generalized): Secondary | ICD-10-CM | POA: Diagnosis not present

## 2023-03-28 DIAGNOSIS — R2681 Unsteadiness on feet: Secondary | ICD-10-CM | POA: Diagnosis not present

## 2023-03-28 DIAGNOSIS — R296 Repeated falls: Secondary | ICD-10-CM | POA: Diagnosis not present

## 2023-03-28 DIAGNOSIS — M5459 Other low back pain: Secondary | ICD-10-CM | POA: Diagnosis not present

## 2023-03-29 DIAGNOSIS — M6281 Muscle weakness (generalized): Secondary | ICD-10-CM | POA: Diagnosis not present

## 2023-03-29 DIAGNOSIS — R2689 Other abnormalities of gait and mobility: Secondary | ICD-10-CM | POA: Diagnosis not present

## 2023-03-29 DIAGNOSIS — Z9181 History of falling: Secondary | ICD-10-CM | POA: Diagnosis not present

## 2023-03-30 DIAGNOSIS — M5459 Other low back pain: Secondary | ICD-10-CM | POA: Diagnosis not present

## 2023-03-30 DIAGNOSIS — R2681 Unsteadiness on feet: Secondary | ICD-10-CM | POA: Diagnosis not present

## 2023-03-30 DIAGNOSIS — R296 Repeated falls: Secondary | ICD-10-CM | POA: Diagnosis not present

## 2023-04-01 DIAGNOSIS — R2689 Other abnormalities of gait and mobility: Secondary | ICD-10-CM | POA: Diagnosis not present

## 2023-04-01 DIAGNOSIS — R2681 Unsteadiness on feet: Secondary | ICD-10-CM | POA: Diagnosis not present

## 2023-04-01 DIAGNOSIS — M5459 Other low back pain: Secondary | ICD-10-CM | POA: Diagnosis not present

## 2023-04-01 DIAGNOSIS — R296 Repeated falls: Secondary | ICD-10-CM | POA: Diagnosis not present

## 2023-04-01 DIAGNOSIS — Z9181 History of falling: Secondary | ICD-10-CM | POA: Diagnosis not present

## 2023-04-01 DIAGNOSIS — M6281 Muscle weakness (generalized): Secondary | ICD-10-CM | POA: Diagnosis not present

## 2023-04-04 DIAGNOSIS — M5459 Other low back pain: Secondary | ICD-10-CM | POA: Diagnosis not present

## 2023-04-04 DIAGNOSIS — R2681 Unsteadiness on feet: Secondary | ICD-10-CM | POA: Diagnosis not present

## 2023-04-04 DIAGNOSIS — R296 Repeated falls: Secondary | ICD-10-CM | POA: Diagnosis not present

## 2023-04-05 DIAGNOSIS — R2689 Other abnormalities of gait and mobility: Secondary | ICD-10-CM | POA: Diagnosis not present

## 2023-04-05 DIAGNOSIS — M6281 Muscle weakness (generalized): Secondary | ICD-10-CM | POA: Diagnosis not present

## 2023-04-05 DIAGNOSIS — Z9181 History of falling: Secondary | ICD-10-CM | POA: Diagnosis not present

## 2023-04-07 DIAGNOSIS — R296 Repeated falls: Secondary | ICD-10-CM | POA: Diagnosis not present

## 2023-04-07 DIAGNOSIS — R2681 Unsteadiness on feet: Secondary | ICD-10-CM | POA: Diagnosis not present

## 2023-04-07 DIAGNOSIS — M5459 Other low back pain: Secondary | ICD-10-CM | POA: Diagnosis not present

## 2023-04-08 DIAGNOSIS — R2681 Unsteadiness on feet: Secondary | ICD-10-CM | POA: Diagnosis not present

## 2023-04-08 DIAGNOSIS — M5459 Other low back pain: Secondary | ICD-10-CM | POA: Diagnosis not present

## 2023-04-08 DIAGNOSIS — Z9181 History of falling: Secondary | ICD-10-CM | POA: Diagnosis not present

## 2023-04-08 DIAGNOSIS — R2689 Other abnormalities of gait and mobility: Secondary | ICD-10-CM | POA: Diagnosis not present

## 2023-04-08 DIAGNOSIS — M6281 Muscle weakness (generalized): Secondary | ICD-10-CM | POA: Diagnosis not present

## 2023-04-08 DIAGNOSIS — R296 Repeated falls: Secondary | ICD-10-CM | POA: Diagnosis not present

## 2023-04-11 DIAGNOSIS — M6281 Muscle weakness (generalized): Secondary | ICD-10-CM | POA: Diagnosis not present

## 2023-04-11 DIAGNOSIS — M5459 Other low back pain: Secondary | ICD-10-CM | POA: Diagnosis not present

## 2023-04-11 DIAGNOSIS — R2681 Unsteadiness on feet: Secondary | ICD-10-CM | POA: Diagnosis not present

## 2023-04-11 DIAGNOSIS — R2689 Other abnormalities of gait and mobility: Secondary | ICD-10-CM | POA: Diagnosis not present

## 2023-04-11 DIAGNOSIS — Z9181 History of falling: Secondary | ICD-10-CM | POA: Diagnosis not present

## 2023-04-11 DIAGNOSIS — R296 Repeated falls: Secondary | ICD-10-CM | POA: Diagnosis not present

## 2023-04-12 DIAGNOSIS — M6281 Muscle weakness (generalized): Secondary | ICD-10-CM | POA: Diagnosis not present

## 2023-04-12 DIAGNOSIS — M5459 Other low back pain: Secondary | ICD-10-CM | POA: Diagnosis not present

## 2023-04-12 DIAGNOSIS — R2681 Unsteadiness on feet: Secondary | ICD-10-CM | POA: Diagnosis not present

## 2023-04-12 DIAGNOSIS — R296 Repeated falls: Secondary | ICD-10-CM | POA: Diagnosis not present

## 2023-04-12 DIAGNOSIS — R2689 Other abnormalities of gait and mobility: Secondary | ICD-10-CM | POA: Diagnosis not present

## 2023-04-12 DIAGNOSIS — Z9181 History of falling: Secondary | ICD-10-CM | POA: Diagnosis not present

## 2023-04-14 DIAGNOSIS — R296 Repeated falls: Secondary | ICD-10-CM | POA: Diagnosis not present

## 2023-04-14 DIAGNOSIS — M5459 Other low back pain: Secondary | ICD-10-CM | POA: Diagnosis not present

## 2023-04-14 DIAGNOSIS — R2681 Unsteadiness on feet: Secondary | ICD-10-CM | POA: Diagnosis not present

## 2023-04-15 DIAGNOSIS — Z961 Presence of intraocular lens: Secondary | ICD-10-CM | POA: Diagnosis not present

## 2023-04-15 DIAGNOSIS — H401121 Primary open-angle glaucoma, left eye, mild stage: Secondary | ICD-10-CM | POA: Diagnosis not present

## 2023-04-15 DIAGNOSIS — H40001 Preglaucoma, unspecified, right eye: Secondary | ICD-10-CM | POA: Diagnosis not present

## 2023-04-18 DIAGNOSIS — R296 Repeated falls: Secondary | ICD-10-CM | POA: Diagnosis not present

## 2023-04-18 DIAGNOSIS — R2681 Unsteadiness on feet: Secondary | ICD-10-CM | POA: Diagnosis not present

## 2023-04-18 DIAGNOSIS — R2689 Other abnormalities of gait and mobility: Secondary | ICD-10-CM | POA: Diagnosis not present

## 2023-04-18 DIAGNOSIS — M6281 Muscle weakness (generalized): Secondary | ICD-10-CM | POA: Diagnosis not present

## 2023-04-18 DIAGNOSIS — M5459 Other low back pain: Secondary | ICD-10-CM | POA: Diagnosis not present

## 2023-04-18 DIAGNOSIS — Z9181 History of falling: Secondary | ICD-10-CM | POA: Diagnosis not present

## 2023-04-25 DIAGNOSIS — R2681 Unsteadiness on feet: Secondary | ICD-10-CM | POA: Diagnosis not present

## 2023-04-25 DIAGNOSIS — M5459 Other low back pain: Secondary | ICD-10-CM | POA: Diagnosis not present

## 2023-04-25 DIAGNOSIS — Z9181 History of falling: Secondary | ICD-10-CM | POA: Diagnosis not present

## 2023-04-25 DIAGNOSIS — R296 Repeated falls: Secondary | ICD-10-CM | POA: Diagnosis not present

## 2023-04-25 DIAGNOSIS — R2689 Other abnormalities of gait and mobility: Secondary | ICD-10-CM | POA: Diagnosis not present

## 2023-04-25 DIAGNOSIS — M6281 Muscle weakness (generalized): Secondary | ICD-10-CM | POA: Diagnosis not present

## 2023-04-27 DIAGNOSIS — R296 Repeated falls: Secondary | ICD-10-CM | POA: Diagnosis not present

## 2023-04-27 DIAGNOSIS — M5459 Other low back pain: Secondary | ICD-10-CM | POA: Diagnosis not present

## 2023-04-27 DIAGNOSIS — R2681 Unsteadiness on feet: Secondary | ICD-10-CM | POA: Diagnosis not present

## 2023-04-28 DIAGNOSIS — R2681 Unsteadiness on feet: Secondary | ICD-10-CM | POA: Diagnosis not present

## 2023-04-28 DIAGNOSIS — M5459 Other low back pain: Secondary | ICD-10-CM | POA: Diagnosis not present

## 2023-04-28 DIAGNOSIS — R296 Repeated falls: Secondary | ICD-10-CM | POA: Diagnosis not present

## 2023-05-02 DIAGNOSIS — R296 Repeated falls: Secondary | ICD-10-CM | POA: Diagnosis not present

## 2023-05-02 DIAGNOSIS — R2689 Other abnormalities of gait and mobility: Secondary | ICD-10-CM | POA: Diagnosis not present

## 2023-05-02 DIAGNOSIS — R2681 Unsteadiness on feet: Secondary | ICD-10-CM | POA: Diagnosis not present

## 2023-05-02 DIAGNOSIS — Z9181 History of falling: Secondary | ICD-10-CM | POA: Diagnosis not present

## 2023-05-02 DIAGNOSIS — M6281 Muscle weakness (generalized): Secondary | ICD-10-CM | POA: Diagnosis not present

## 2023-05-02 DIAGNOSIS — M5459 Other low back pain: Secondary | ICD-10-CM | POA: Diagnosis not present

## 2023-05-04 DIAGNOSIS — R2681 Unsteadiness on feet: Secondary | ICD-10-CM | POA: Diagnosis not present

## 2023-05-04 DIAGNOSIS — M5459 Other low back pain: Secondary | ICD-10-CM | POA: Diagnosis not present

## 2023-05-04 DIAGNOSIS — R296 Repeated falls: Secondary | ICD-10-CM | POA: Diagnosis not present

## 2023-05-05 DIAGNOSIS — R2681 Unsteadiness on feet: Secondary | ICD-10-CM | POA: Diagnosis not present

## 2023-05-05 DIAGNOSIS — R296 Repeated falls: Secondary | ICD-10-CM | POA: Diagnosis not present

## 2023-05-05 DIAGNOSIS — M5459 Other low back pain: Secondary | ICD-10-CM | POA: Diagnosis not present

## 2023-05-09 DIAGNOSIS — M5459 Other low back pain: Secondary | ICD-10-CM | POA: Diagnosis not present

## 2023-05-09 DIAGNOSIS — M5416 Radiculopathy, lumbar region: Secondary | ICD-10-CM | POA: Diagnosis not present

## 2023-05-09 DIAGNOSIS — M48062 Spinal stenosis, lumbar region with neurogenic claudication: Secondary | ICD-10-CM | POA: Diagnosis not present

## 2023-05-09 DIAGNOSIS — M25571 Pain in right ankle and joints of right foot: Secondary | ICD-10-CM | POA: Diagnosis not present

## 2023-05-09 DIAGNOSIS — R2681 Unsteadiness on feet: Secondary | ICD-10-CM | POA: Diagnosis not present

## 2023-05-09 DIAGNOSIS — R296 Repeated falls: Secondary | ICD-10-CM | POA: Diagnosis not present

## 2023-05-11 DIAGNOSIS — Z9181 History of falling: Secondary | ICD-10-CM | POA: Diagnosis not present

## 2023-05-11 DIAGNOSIS — M5459 Other low back pain: Secondary | ICD-10-CM | POA: Diagnosis not present

## 2023-05-11 DIAGNOSIS — R2681 Unsteadiness on feet: Secondary | ICD-10-CM | POA: Diagnosis not present

## 2023-05-11 DIAGNOSIS — M6281 Muscle weakness (generalized): Secondary | ICD-10-CM | POA: Diagnosis not present

## 2023-05-11 DIAGNOSIS — R296 Repeated falls: Secondary | ICD-10-CM | POA: Diagnosis not present

## 2023-05-11 DIAGNOSIS — R2689 Other abnormalities of gait and mobility: Secondary | ICD-10-CM | POA: Diagnosis not present

## 2023-05-16 DIAGNOSIS — R296 Repeated falls: Secondary | ICD-10-CM | POA: Diagnosis not present

## 2023-05-16 DIAGNOSIS — R2681 Unsteadiness on feet: Secondary | ICD-10-CM | POA: Diagnosis not present

## 2023-05-16 DIAGNOSIS — M5459 Other low back pain: Secondary | ICD-10-CM | POA: Diagnosis not present

## 2023-05-18 DIAGNOSIS — R296 Repeated falls: Secondary | ICD-10-CM | POA: Diagnosis not present

## 2023-05-18 DIAGNOSIS — R2681 Unsteadiness on feet: Secondary | ICD-10-CM | POA: Diagnosis not present

## 2023-05-18 DIAGNOSIS — M5459 Other low back pain: Secondary | ICD-10-CM | POA: Diagnosis not present

## 2023-05-19 DIAGNOSIS — R296 Repeated falls: Secondary | ICD-10-CM | POA: Diagnosis not present

## 2023-05-19 DIAGNOSIS — R2681 Unsteadiness on feet: Secondary | ICD-10-CM | POA: Diagnosis not present

## 2023-05-19 DIAGNOSIS — M5459 Other low back pain: Secondary | ICD-10-CM | POA: Diagnosis not present

## 2023-05-21 ENCOUNTER — Other Ambulatory Visit: Payer: Self-pay

## 2023-05-21 ENCOUNTER — Emergency Department

## 2023-05-21 ENCOUNTER — Inpatient Hospital Stay
Admission: EM | Admit: 2023-05-21 | Discharge: 2023-05-25 | DRG: 377 | Disposition: A | Discharge status: Home Health | Attending: Internal Medicine | Admitting: Internal Medicine

## 2023-05-21 DIAGNOSIS — K625 Hemorrhage of anus and rectum: Secondary | ICD-10-CM

## 2023-05-21 DIAGNOSIS — J90 Pleural effusion, not elsewhere classified: Secondary | ICD-10-CM | POA: Diagnosis not present

## 2023-05-21 DIAGNOSIS — R7303 Prediabetes: Secondary | ICD-10-CM | POA: Diagnosis present

## 2023-05-21 DIAGNOSIS — R059 Cough, unspecified: Secondary | ICD-10-CM | POA: Diagnosis not present

## 2023-05-21 DIAGNOSIS — J189 Pneumonia, unspecified organism: Principal | ICD-10-CM | POA: Diagnosis present

## 2023-05-21 DIAGNOSIS — G629 Polyneuropathy, unspecified: Secondary | ICD-10-CM | POA: Diagnosis present

## 2023-05-21 DIAGNOSIS — R0602 Shortness of breath: Secondary | ICD-10-CM

## 2023-05-21 DIAGNOSIS — Z1152 Encounter for screening for COVID-19: Secondary | ICD-10-CM

## 2023-05-21 DIAGNOSIS — R918 Other nonspecific abnormal finding of lung field: Secondary | ICD-10-CM | POA: Diagnosis not present

## 2023-05-21 DIAGNOSIS — I4819 Other persistent atrial fibrillation: Secondary | ICD-10-CM | POA: Diagnosis not present

## 2023-05-21 DIAGNOSIS — Z9842 Cataract extraction status, left eye: Secondary | ICD-10-CM

## 2023-05-21 DIAGNOSIS — K640 First degree hemorrhoids: Secondary | ICD-10-CM | POA: Diagnosis present

## 2023-05-21 DIAGNOSIS — Z87891 Personal history of nicotine dependence: Secondary | ICD-10-CM

## 2023-05-21 DIAGNOSIS — I10 Essential (primary) hypertension: Secondary | ICD-10-CM | POA: Diagnosis present

## 2023-05-21 DIAGNOSIS — I4891 Unspecified atrial fibrillation: Secondary | ICD-10-CM

## 2023-05-21 DIAGNOSIS — Z800 Family history of malignant neoplasm of digestive organs: Secondary | ICD-10-CM

## 2023-05-21 DIAGNOSIS — K5730 Diverticulosis of large intestine without perforation or abscess without bleeding: Secondary | ICD-10-CM | POA: Diagnosis not present

## 2023-05-21 DIAGNOSIS — I272 Pulmonary hypertension, unspecified: Secondary | ICD-10-CM | POA: Diagnosis present

## 2023-05-21 DIAGNOSIS — I340 Nonrheumatic mitral (valve) insufficiency: Secondary | ICD-10-CM | POA: Diagnosis present

## 2023-05-21 DIAGNOSIS — Z9841 Cataract extraction status, right eye: Secondary | ICD-10-CM

## 2023-05-21 DIAGNOSIS — E785 Hyperlipidemia, unspecified: Secondary | ICD-10-CM | POA: Diagnosis not present

## 2023-05-21 DIAGNOSIS — E039 Hypothyroidism, unspecified: Secondary | ICD-10-CM | POA: Diagnosis not present

## 2023-05-21 DIAGNOSIS — Z823 Family history of stroke: Secondary | ICD-10-CM

## 2023-05-21 DIAGNOSIS — K5731 Diverticulosis of large intestine without perforation or abscess with bleeding: Principal | ICD-10-CM | POA: Diagnosis present

## 2023-05-21 DIAGNOSIS — J188 Other pneumonia, unspecified organism: Secondary | ICD-10-CM | POA: Diagnosis not present

## 2023-05-21 DIAGNOSIS — Z79890 Hormone replacement therapy (postmenopausal): Secondary | ICD-10-CM

## 2023-05-21 DIAGNOSIS — K922 Gastrointestinal hemorrhage, unspecified: Secondary | ICD-10-CM | POA: Diagnosis not present

## 2023-05-21 DIAGNOSIS — R5383 Other fatigue: Secondary | ICD-10-CM | POA: Diagnosis not present

## 2023-05-21 DIAGNOSIS — Z882 Allergy status to sulfonamides status: Secondary | ICD-10-CM

## 2023-05-21 DIAGNOSIS — E7800 Pure hypercholesterolemia, unspecified: Secondary | ICD-10-CM | POA: Diagnosis present

## 2023-05-21 DIAGNOSIS — R599 Enlarged lymph nodes, unspecified: Secondary | ICD-10-CM | POA: Diagnosis not present

## 2023-05-21 DIAGNOSIS — K5900 Constipation, unspecified: Secondary | ICD-10-CM | POA: Diagnosis present

## 2023-05-21 DIAGNOSIS — Z79899 Other long term (current) drug therapy: Secondary | ICD-10-CM

## 2023-05-21 DIAGNOSIS — R54 Age-related physical debility: Secondary | ICD-10-CM | POA: Diagnosis present

## 2023-05-21 DIAGNOSIS — J159 Unspecified bacterial pneumonia: Secondary | ICD-10-CM | POA: Diagnosis present

## 2023-05-21 DIAGNOSIS — D62 Acute posthemorrhagic anemia: Secondary | ICD-10-CM | POA: Diagnosis present

## 2023-05-21 DIAGNOSIS — Z87440 Personal history of urinary (tract) infections: Secondary | ICD-10-CM

## 2023-05-21 DIAGNOSIS — R058 Other specified cough: Secondary | ICD-10-CM | POA: Diagnosis not present

## 2023-05-21 HISTORY — DX: Spinal stenosis, site unspecified: M48.00

## 2023-05-21 HISTORY — DX: Pneumonia, unspecified organism: J18.9

## 2023-05-21 LAB — RESP PANEL BY RT-PCR (RSV, FLU A&B, COVID)  RVPGX2
Influenza A by PCR: NEGATIVE
Influenza B by PCR: NEGATIVE
Resp Syncytial Virus by PCR: NEGATIVE
SARS Coronavirus 2 by RT PCR: NEGATIVE

## 2023-05-21 LAB — CBC WITH DIFFERENTIAL/PLATELET
Abs Immature Granulocytes: 0.02 10*3/uL (ref 0.00–0.07)
Basophils Absolute: 0 10*3/uL (ref 0.0–0.1)
Basophils Relative: 1 %
Eosinophils Absolute: 0 10*3/uL (ref 0.0–0.5)
Eosinophils Relative: 0 %
HCT: 43.5 % (ref 36.0–46.0)
Hemoglobin: 13.7 g/dL (ref 12.0–15.0)
Immature Granulocytes: 0 %
Lymphocytes Relative: 15 %
Lymphs Abs: 1.2 10*3/uL (ref 0.7–4.0)
MCH: 29.8 pg (ref 26.0–34.0)
MCHC: 31.5 g/dL (ref 30.0–36.0)
MCV: 94.8 fL (ref 80.0–100.0)
Monocytes Absolute: 0.4 10*3/uL (ref 0.1–1.0)
Monocytes Relative: 6 %
Neutro Abs: 6 10*3/uL (ref 1.7–7.7)
Neutrophils Relative %: 78 %
Platelets: 245 10*3/uL (ref 150–400)
RBC: 4.59 MIL/uL (ref 3.87–5.11)
RDW: 12.7 % (ref 11.5–15.5)
WBC: 7.7 10*3/uL (ref 4.0–10.5)
nRBC: 0 % (ref 0.0–0.2)

## 2023-05-21 LAB — BASIC METABOLIC PANEL WITH GFR
Anion gap: 8 (ref 5–15)
BUN: 13 mg/dL (ref 8–23)
CO2: 26 mmol/L (ref 22–32)
Calcium: 8.4 mg/dL — ABNORMAL LOW (ref 8.9–10.3)
Chloride: 103 mmol/L (ref 98–111)
Creatinine, Ser: 0.6 mg/dL (ref 0.44–1.00)
GFR, Estimated: >60 mL/min (ref >60)
Glucose, Bld: 118 mg/dL — ABNORMAL HIGH (ref 70–99)
Potassium: 3.6 mmol/L (ref 3.5–5.1)
Sodium: 137 mmol/L (ref 135–145)

## 2023-05-21 LAB — HIV ANTIBODY (ROUTINE TESTING W REFLEX): HIV Screen 4th Generation wRfx: NONREACTIVE

## 2023-05-21 LAB — TROPONIN I (HIGH SENSITIVITY)
Troponin I (High Sensitivity): 9 ng/L (ref <18)
Troponin I (High Sensitivity): 9 ng/L (ref <18)

## 2023-05-21 LAB — BRAIN NATRIURETIC PEPTIDE: B Natriuretic Peptide: 237.3 pg/mL — ABNORMAL HIGH (ref 0.0–100.0)

## 2023-05-21 MED ORDER — ONDANSETRON HCL 4 MG PO TABS
4.0000 mg | ORAL_TABLET | Freq: Four times a day (QID) | ORAL | Status: DC | PRN
Start: 2023-05-21 — End: 2023-05-25

## 2023-05-21 MED ORDER — ONDANSETRON HCL 4 MG/2ML IJ SOLN
4.0000 mg | Freq: Four times a day (QID) | INTRAMUSCULAR | Status: DC | PRN
  Administered 2023-05-22: 4 mg via INTRAVENOUS
  Filled 2023-05-21: qty 2

## 2023-05-21 MED ORDER — SODIUM CHLORIDE 0.9 % IV SOLN: INTRAVENOUS | Status: AC

## 2023-05-21 MED ORDER — ENSURE ENLIVE PO LIQD
237.0000 mL | Freq: Two times a day (BID) | ORAL | Status: DC
  Administered 2023-05-22 – 2023-05-25 (×6): 237 mL via ORAL

## 2023-05-21 MED ORDER — ACETAMINOPHEN 325 MG PO TABS
650.0000 mg | ORAL_TABLET | Freq: Four times a day (QID) | ORAL | Status: DC | PRN
  Administered 2023-05-24: 650 mg via ORAL
  Filled 2023-05-21: qty 2

## 2023-05-21 MED ORDER — SODIUM CHLORIDE 0.9 % IV SOLN
500.0000 mg | Freq: Once | INTRAVENOUS | Status: AC
  Administered 2023-05-21: 500 mg via INTRAVENOUS
  Filled 2023-05-21: qty 5

## 2023-05-21 MED ORDER — SODIUM CHLORIDE 0.9 % IV SOLN
2.0000 g | INTRAVENOUS | Status: DC
  Administered 2023-05-22 – 2023-05-25 (×4): 2 g via INTRAVENOUS
  Filled 2023-05-21 (×5): qty 20

## 2023-05-21 MED ORDER — SODIUM CHLORIDE 0.9 % IV SOLN
1.0000 g | Freq: Once | INTRAVENOUS | Status: AC
  Administered 2023-05-21: 1 g via INTRAVENOUS
  Filled 2023-05-21: qty 10

## 2023-05-21 MED ORDER — SODIUM CHLORIDE 0.9 % IV SOLN
500.0000 mg | INTRAVENOUS | Status: DC
  Administered 2023-05-22 – 2023-05-24 (×3): 500 mg via INTRAVENOUS
  Filled 2023-05-21 (×4): qty 5

## 2023-05-21 MED ORDER — ENOXAPARIN SODIUM 40 MG/0.4ML IJ SOSY
40.0000 mg | PREFILLED_SYRINGE | INTRAMUSCULAR | Status: DC
  Administered 2023-05-21: 40 mg via SUBCUTANEOUS
  Filled 2023-05-21: qty 0.4

## 2023-05-21 NOTE — ED Notes (Signed)
 Fall precautions in place for Pt. This RN placed fall band, fall grip socks, bed alarm and fall sign.

## 2023-05-21 NOTE — ED Triage Notes (Signed)
 Pt arrives via [facility] from [facility] C/o productive cough, w/clear mucus, fatigue  Notices a rattle in her own chest EMS: clear and equal lung sounds, coughing spells EKG WNL Vitals: BP 144/74 HR: 80 95% RA, 97.9 Oral, Stands and pivots MedHx: Osteoarthritis, hypothyroidism

## 2023-05-21 NOTE — Assessment & Plan Note (Signed)
 Cont statin

## 2023-05-21 NOTE — ED Provider Notes (Signed)
 [facility] Provider Note    Event Date/Time   First MD Initiated Contact with Patient 05/21/23 0920     (approximate)   History   Fatigue   HPI  Scottlyn Marku is a 89 y.o. female with a history of hypertension, hypothyroidism, hypercholesterolemia, prediabetes, and vitamin D  deficiency who presents with a cough since last night, productive of clear mucus and associated with a sensation of a rattling or crackling in her chest.  She reports some associated shortness of breath.  She denies any chest pain but has had some pressure since yesterday.  She denies any fever or chills.  She has no leg swelling.  She denies any sick contacts.  I reviewed the past medical records.  The patient's most recent primary care visit was on 12/30 with Dr. Tullo for follow-up of her chronic conditions.  She was seen in the ED in November of last year after a fall but otherwise has no recent ED visits or hospitalizations.   Physical Exam   Triage Vital Signs: ED Triage Vitals  Encounter Vitals Group     BP      Systolic BP Percentile      Diastolic BP Percentile      Pulse      Resp      Temp      Temp src      SpO2      Weight      Height      Head Circumference      Peak Flow      Pain Score      Pain Loc      Pain Education      Exclude from Growth Chart     Most recent vital signs: Vitals:   05/21/23 0929 05/21/23 1230  BP:  (!) 146/73  Pulse:  78  Resp:  19  Temp: 97.9 F (36.6 C)   SpO2:  95%     General: Alert, well-appearing for age, no distress.  CV:  Good peripheral perfusion.  Resp:  Normal effort.  Lungs CTAB. Abd:  No distention.  Other:  No peripheral edema.   ED Results / Procedures / Treatments   Labs (all labs ordered are listed, but only abnormal results are displayed) Labs Reviewed  BRAIN NATRIURETIC PEPTIDE - Abnormal; Notable for the following components:      Result Value   B Natriuretic Peptide 237.3 (*)    All other  components within normal limits  BASIC METABOLIC PANEL WITH GFR - Abnormal; Notable for the following components:   Glucose, Bld 118 (*)    Calcium  8.4 (*)    All other components within normal limits  RESP PANEL BY RT-PCR (RSV, FLU A&B, COVID)  [id number]  CBC WITH DIFFERENTIAL/PLATELET  TROPONIN I (HIGH SENSITIVITY)  TROPONIN I (HIGH SENSITIVITY)     EKG  ED ECG REPORT I, Lind Repine, the attending physician, personally viewed and interpreted this ECG.  Date: 05/21/2023 EKG Time: 0955 Rate: 80 Rhythm: Atrial fibrillation QRS Axis: normal Intervals: normal ST/T Wave abnormalities: normal Narrative Interpretation: no evidence of acute ischemia    RADIOLOGY  Chest x-ray: I independently viewed and interpreted the images; there is a left lung effusion, unclear if infiltrate  CT chest:  IMPRESSION:  1. Left lower lobe pneumonia with suspected parapneumonic effusion.  At this is radiographically occult, consideration should be given  toward follow-up CT scan in 1-2 months to ensure clearance and  absence of an  underlying process.    PROCEDURES:  Critical Care performed: No  Procedures   MEDICATIONS ORDERED IN ED: Medications  cefTRIAXone (ROCEPHIN) 1 g in sodium chloride  0.9 % 100 mL IVPB (has no administration in time range)  azithromycin (ZITHROMAX) 500 mg in sodium chloride  0.9 % 250 mL IVPB (has no administration in time range)  0.9 %  sodium chloride  infusion (has no administration in time range)  ondansetron  (ZOFRAN ) tablet 4 mg (has no administration in time range)    Or  ondansetron  (ZOFRAN ) injection 4 mg (has no administration in time range)  acetaminophen (TYLENOL) tablet 650 mg (has no administration in time range)  enoxaparin (LOVENOX) injection 40 mg (has no administration in time range)     IMPRESSION / MDM / ASSESSMENT AND PLAN / ED COURSE  I reviewed the triage vital signs and the nursing notes.  89 year old female with PMH as noted  above presents with a productive cough since last night associated with some shortness of breath.  On exam the patient is overall well-appearing.  Her vital signs are normal.  O2 saturation is in the mid 90s on room air.  There is a wet/crackly sound when she coughs, however auscultating her lungs reveals clear breath sounds.  There is no edema.  Differential diagnosis includes, but is not limited to, acute bronchitis, COVID-19, RSV, influenza or other viral syndrome, bacterial pneumonia, pulmonary edema, effusion.  Will obtain chest x-ray, lab workup including respiratory panel and cardiac enzymes, and reassess.  Of note, EKG appears to show atrial fibrillation which the patient does not have a history of.  Patient's presentation is most consistent with acute presentation with potential threat to life or bodily function.  The patient is on the cardiac monitor to evaluate for evidence of arrhythmia and/or significant heart rate changes.  ----------------------------------------- 3:23 PM on 05/21/2023 -----------------------------------------  Chest x-ray showed a left lung effusion although was somewhat equivocal otherwise.  We obtained a CT chest which does show findings consistent with a left lower lobe pneumonia and parapneumonic effusion.  Lab workup is reassuring.  Respiratory panel is negative.  CBC shows no leukocytosis.  Troponin is negative.  Repeat EKG confirms atrial fibrillation.  Her CHA2DS2-VASc score is 5.  Therefore she is at elevated risk of stroke.  Based on the commendation findings I recommended inpatient admission and the patient agreed.  I then consulted Dr. Daisey Dryer from the hospitalist service; based on our discussion he agreed to evaluate the patient for admission.    FINAL CLINICAL IMPRESSION(S) / ED DIAGNOSES   Final diagnoses:  Community acquired pneumonia of left lower lobe of lung  Pleural effusion  Atrial fibrillation, unspecified type [facility])     Rx / DC  Orders   ED Discharge Orders          Ordered    Amb referral to [facility] Clinic        05/21/23 1518             Note:  This document was prepared using Dragon voice recognition software and may include unintentional dictation errors.    Lind Repine, MD 05/21/23 1524

## 2023-05-21 NOTE — ED Notes (Signed)
 This RN noted 2 instances of diarrhea, watery in consistency, states she takes benefiber daily as a recommendation by her PCP

## 2023-05-21 NOTE — Assessment & Plan Note (Signed)
 Positive cough, sputum production and shortness of breath with noted left lower lobe pneumonia parapneumonic effusion on CT imaging IV Rocephin azithromycin for infectious coverage Blood and respiratory cultures Minimal hypoxia present Supplemental oxygen as needed Monitor

## 2023-05-21 NOTE — Assessment & Plan Note (Signed)
 BP stable Titrate home regimen

## 2023-05-21 NOTE — ED Notes (Signed)
 Pt ambulated to toilet, pericare applied. No incident, aback to bed with CB in reach and hooked back up to monitor.

## 2023-05-21 NOTE — H&P (Signed)
 History and Physical    PatientAaron Carr Gloria Carr ZOX:[id number] DOB: 12-27-1927 DOA: 05/21/2023 DOS: the patient was seen and examined on 05/21/2023 PCP: Thersia Flax, MD  Patient coming from: Home  Chief Complaint:  Chief Complaint  Patient presents with   Fatigue   HPI: Gloria Carr is a 89 y.o. female with medical history significant of HTN, HLD, macular degeneratio presenting with pneumonia and atrial fibrillation.  Patient reports worsening cough, sputum production as well as shortness of breath over the past 3 to 4 days.  No fevers or chills.  No nausea or vomiting.  Cough and shortness of breath is persistently worsened.  Denies any prior episodes like this in the past.  No palpitations.  No nausea or vomiting.  No abdominal pain or diarrhea.  Noticed chronic urinary issues.  Denies any dysuria.  Has chronic urinary frequency.  No orthopnea or PND.  Patient denies any known prior cardiac history in the past. Presented to the ER afebrile, hemodynamically stable.  Satting well on room air.  White count 7.7, hemoglobin 13.7, platelets 245, creatinine 0.6.  Glucose 118.  BNP 237.  CT chest with left lower lobe pneumonia with?  Parapneumonic effusion. Review of Systems: As mentioned in the history of present illness. All other systems reviewed and are negative. Past Medical History:  Diagnosis Date   Allergy    Arthritis    Closed fracture of left superior pubic ramus, with routine healing, subsequent encounter 08/24/2016   E. coli UTI 04/08/2022   Glaucoma    History of chickenpox    History of recurrent UTIs    Hypertension    Macular degeneration    Thyroid  disease    Past Surgical History:  Procedure Laterality Date   BILATERAL CARPAL TUNNEL RELEASE  1999-2000   CATARACT EXTRACTION, BILATERAL  2013   Social History:  reports that she has quit smoking. She has never used smokeless tobacco. She reports current alcohol use of about 5.0 standard drinks of alcohol per week. She reports  that she does not use drugs.  Allergies  Allergen Reactions   Sulfa Antibiotics Other (See Comments)    Childhood reaction     Family History  Problem Relation Age of Onset   Stroke Mother    Cancer Mother 57       colon   Cancer Father        bone   Cancer Son        Jaw   Stroke Sister    Breast cancer Neg Hx     Prior to Admission medications   Medication Sig Start Date End Date Taking? Authorizing Provider  amLODipine  (NORVASC ) 5 MG tablet TAKE 1 TABLET BY MOUTH DAILY. DOSE INCREASE. 01/10/23  Yes Thersia Flax, MD  atorvastatin  (LIPITOR) 20 MG tablet Take 1 tablet (20 mg total) by mouth daily. 07/09/22  Yes Thersia Flax, MD  Cholecalciferol (VITAMIN D3) 50 MCG (2000 UT) capsule Take 2,000 Units by mouth daily.   Yes [provider]  diclofenac Sodium (VOLTAREN) 1 % GEL Apply 2 g topically 4 (four) times daily.   Yes [provider]  latanoprost (XALATAN) 0.005 % ophthalmic solution Place 1 drop into the left eye at bedtime. 07/08/14  Yes [provider]  levothyroxine  (SYNTHROID ) 50 MCG tablet TAKE 1 TABLET EVERY DAY ON EMPTY STOMACHWITH A GLASS OF WATER AT LEAST 30-60 MINBEFORE BREAKFAST 01/10/23  Yes Thersia Flax, MD  Multiple Vitamins-Minerals (PRESERVISION AREDS 2 PO) Take by mouth.  Yes [provider]  Propylene Glycol (SYSTANE BALANCE OP) Apply to eye.   Yes [provider]  denosumab  (PROLIA ) 60 MG/ML SOSY injection Inject 60 mg into the skin every 6 (six) months.    [provider]  traMADol  (ULTRAM ) 50 MG tablet Take 50 mg by mouth 3 (three) times daily as needed. Patient not taking: Reported on 05/21/2023    [provider]    Physical Exam: Vitals:   05/21/23 0925 05/21/23 0926 05/21/23 0929 05/21/23 1230  BP: (!) 144/76   (!) 146/73  Pulse: 76   78  Resp: 20   19  Temp:   97.9 F (36.6 C)   TempSrc:   Oral   SpO2: 95%   95%  Weight:  50.2 kg    Height:  4\' 11"  (1.499 m)     Physical  Exam Constitutional:      Appearance: She is normal weight.  HENT:     Head: Normocephalic and atraumatic.     Nose: Nose normal.     Mouth/Throat:     Mouth: Mucous membranes are moist.  Eyes:     Pupils: Pupils are equal, round, and reactive to light.  Cardiovascular:     Rate and Rhythm: Normal rate and regular rhythm.  Pulmonary:     Effort: Pulmonary effort is normal.  Abdominal:     General: Bowel sounds are normal.  Musculoskeletal:        General: Normal range of motion.  Skin:    General: Skin is warm.  Neurological:     General: No focal deficit present.  Psychiatric:        Mood and Affect: Mood normal.     Data Reviewed:  There are no new results to review at this time.  CT CHEST WO CONTRAST CLINICAL DATA:  Respiratory illness  EXAM: CT CHEST WITHOUT CONTRAST  TECHNIQUE: Multidetector CT imaging of the chest was performed following the standard protocol without IV contrast.  RADIATION DOSE REDUCTION: This exam was performed according to the departmental dose-optimization program which includes automated exposure control, adjustment of the mA and/or kV according to patient size and/or use of iterative reconstruction technique.  COMPARISON:  Chest x-ray performed May 21, 2023  FINDINGS: Cardiovascular: Heart is enlarged. Calcific atherosclerosis is present in the left coronary territory. Atherosclerotic changes are also present in the thoracic aorta.  Mediastinum/Nodes: Mildly enlarged lymph nodes are present which may be reactive.  Lungs/Pleura: There is a focus of consolidation in the medial posterior aspect of the left lower lobe with a small parapneumonic effusion. Mucous plugging is also present within the left lower lobe airways.  Upper Abdomen: Nothing significant.  Musculoskeletal: Degenerative changes in the imaged osseous structures to include both shoulders.  IMPRESSION: 1. Left lower lobe pneumonia with suspected parapneumonic  effusion. At this is radiographically occult, consideration should be given toward follow-up CT scan in 1-2 months to ensure clearance and absence of an underlying process.  Electronically Signed   By: Reagan Camera M.D.   On: 05/21/2023 14:24 DG Chest 2 View CLINICAL DATA:  Productive cough and fatigue.  EXAM: CHEST - 2 VIEW  COMPARISON:  12/22/2018  FINDINGS: Opacity at the left base at least partially from pleural fluid. The underlying lung is obscured. No edema or pneumothorax. Chronic cardiomegaly. Severe glenohumeral arthropathy especially on the left with extensive calcification.  IMPRESSION: Left pleural effusion obscuring the left base, possible underlying pneumonia given the history. Consider CT given the degree of  fluid.  Electronically Signed   By: Ronnette Coke M.D.   On: 05/21/2023 10:40  Lab Results  Component Value Date   WBC 7.7 05/21/2023   HGB 13.7 05/21/2023   HCT 43.5 05/21/2023   MCV 94.8 05/21/2023   PLT 245 05/21/2023   Last metabolic panel Lab Results  Component Value Date   GLUCOSE 118 (H) 05/21/2023   NA 137 05/21/2023   K 3.6 05/21/2023   CL 103 05/21/2023   CO2 26 05/21/2023   BUN 13 05/21/2023   CREATININE 0.60 05/21/2023   GFRNONAA >60 05/21/2023   CALCIUM  8.4 (L) 05/21/2023   PROT 6.4 01/10/2023   ALBUMIN 3.9 01/10/2023   BILITOT 0.9 01/10/2023   ALKPHOS 53 01/10/2023   AST 27 01/10/2023   ALT 24 01/10/2023   ANIONGAP 8 05/21/2023    Assessment and Plan: * PNA (pneumonia) Positive cough, sputum production and shortness of breath with noted left lower lobe pneumonia parapneumonic effusion on CT imaging IV Rocephin azithromycin for infectious coverage Blood and respiratory cultures Minimal hypoxia present Supplemental oxygen as needed Monitor  Atrial fibrillation (HCC) Noted atrial fibrillation on multiple EKG Rate controlled at present Will add on 2D echo CHA2DS2-VASc score 5+ Defer anticoagulation for now given  age  Consult cardiology Monitor  Essential hypertension BP stable  Titrate home regimen    Pure hypercholesterolemia Cont statin       Advance Care Planning:   Code Status: Full Code   Consults: Cardiology   Family Communication: No family at the bedside   Severity of Illness: The appropriate patient status for this patient is OBSERVATION. Observation status is judged to be reasonable and necessary in order to provide the required intensity of service to ensure the patient's safety. The patient's presenting symptoms, physical exam findings, and initial radiographic and laboratory data in the context of their medical condition is felt to place them at decreased risk for further clinical deterioration. Furthermore, it is anticipated that the patient will be medically stable for discharge from the hospital within 2 midnights of admission.   Author: Corrinne Din, MD 05/21/2023 3:20 PM  For on call review [email or web address].

## 2023-05-21 NOTE — ED Notes (Signed)
 Pt to imaging

## 2023-05-21 NOTE — Assessment & Plan Note (Addendum)
 Noted atrial fibrillation on multiple EKG Rate controlled at present Will add on 2D echo CHA2DS2-VASc score 5+ Defer anticoagulation for now given age  Consult cardiology Monitor

## 2023-05-21 NOTE — ED Notes (Signed)
 Pt ambulated with walker to toilet. Pericare applied no incident. CB in reach and Pt back on monitor.

## 2023-05-22 ENCOUNTER — Encounter: Payer: Self-pay | Admitting: Family Medicine

## 2023-05-22 ENCOUNTER — Observation Stay (HOSPITAL_BASED_OUTPATIENT_CLINIC_OR_DEPARTMENT_OTHER)
Admit: 2023-05-22 | Discharge: 2023-05-22 | Disposition: A | Discharge status: Home / Self Care | Attending: Family Medicine | Admitting: Family Medicine

## 2023-05-22 DIAGNOSIS — E7800 Pure hypercholesterolemia, unspecified: Secondary | ICD-10-CM | POA: Diagnosis not present

## 2023-05-22 DIAGNOSIS — Z9842 Cataract extraction status, left eye: Secondary | ICD-10-CM | POA: Diagnosis not present

## 2023-05-22 DIAGNOSIS — R7303 Prediabetes: Secondary | ICD-10-CM | POA: Diagnosis not present

## 2023-05-22 DIAGNOSIS — K5730 Diverticulosis of large intestine without perforation or abscess without bleeding: Secondary | ICD-10-CM | POA: Diagnosis not present

## 2023-05-22 DIAGNOSIS — K5731 Diverticulosis of large intestine without perforation or abscess with bleeding: Secondary | ICD-10-CM | POA: Diagnosis not present

## 2023-05-22 DIAGNOSIS — E039 Hypothyroidism, unspecified: Secondary | ICD-10-CM | POA: Diagnosis not present

## 2023-05-22 DIAGNOSIS — K922 Gastrointestinal hemorrhage, unspecified: Secondary | ICD-10-CM | POA: Diagnosis not present

## 2023-05-22 DIAGNOSIS — R0602 Shortness of breath: Secondary | ICD-10-CM

## 2023-05-22 DIAGNOSIS — K5900 Constipation, unspecified: Secondary | ICD-10-CM | POA: Diagnosis not present

## 2023-05-22 DIAGNOSIS — I4891 Unspecified atrial fibrillation: Secondary | ICD-10-CM

## 2023-05-22 DIAGNOSIS — R54 Age-related physical debility: Secondary | ICD-10-CM | POA: Diagnosis not present

## 2023-05-22 DIAGNOSIS — I10 Essential (primary) hypertension: Secondary | ICD-10-CM | POA: Diagnosis not present

## 2023-05-22 DIAGNOSIS — J159 Unspecified bacterial pneumonia: Secondary | ICD-10-CM | POA: Diagnosis not present

## 2023-05-22 DIAGNOSIS — J189 Pneumonia, unspecified organism: Secondary | ICD-10-CM | POA: Diagnosis not present

## 2023-05-22 DIAGNOSIS — I340 Nonrheumatic mitral (valve) insufficiency: Secondary | ICD-10-CM | POA: Diagnosis not present

## 2023-05-22 DIAGNOSIS — Z79899 Other long term (current) drug therapy: Secondary | ICD-10-CM | POA: Diagnosis not present

## 2023-05-22 DIAGNOSIS — I272 Pulmonary hypertension, unspecified: Secondary | ICD-10-CM | POA: Diagnosis not present

## 2023-05-22 DIAGNOSIS — Z882 Allergy status to sulfonamides status: Secondary | ICD-10-CM | POA: Diagnosis not present

## 2023-05-22 DIAGNOSIS — Z800 Family history of malignant neoplasm of digestive organs: Secondary | ICD-10-CM | POA: Diagnosis not present

## 2023-05-22 DIAGNOSIS — D62 Acute posthemorrhagic anemia: Secondary | ICD-10-CM | POA: Diagnosis not present

## 2023-05-22 DIAGNOSIS — G629 Polyneuropathy, unspecified: Secondary | ICD-10-CM | POA: Diagnosis not present

## 2023-05-22 DIAGNOSIS — Z1152 Encounter for screening for COVID-19: Secondary | ICD-10-CM | POA: Diagnosis not present

## 2023-05-22 DIAGNOSIS — Z87891 Personal history of nicotine dependence: Secondary | ICD-10-CM | POA: Diagnosis not present

## 2023-05-22 DIAGNOSIS — J90 Pleural effusion, not elsewhere classified: Secondary | ICD-10-CM | POA: Diagnosis present

## 2023-05-22 DIAGNOSIS — K640 First degree hemorrhoids: Secondary | ICD-10-CM | POA: Diagnosis not present

## 2023-05-22 DIAGNOSIS — K625 Hemorrhage of anus and rectum: Secondary | ICD-10-CM | POA: Diagnosis not present

## 2023-05-22 DIAGNOSIS — Z9841 Cataract extraction status, right eye: Secondary | ICD-10-CM | POA: Diagnosis not present

## 2023-05-22 DIAGNOSIS — Z87440 Personal history of urinary (tract) infections: Secondary | ICD-10-CM | POA: Diagnosis not present

## 2023-05-22 DIAGNOSIS — Z79890 Hormone replacement therapy (postmenopausal): Secondary | ICD-10-CM | POA: Diagnosis not present

## 2023-05-22 DIAGNOSIS — Z823 Family history of stroke: Secondary | ICD-10-CM | POA: Diagnosis not present

## 2023-05-22 DIAGNOSIS — I4819 Other persistent atrial fibrillation: Secondary | ICD-10-CM | POA: Diagnosis not present

## 2023-05-22 LAB — CBC WITH DIFFERENTIAL/PLATELET
Abs Immature Granulocytes: 0.04 10*3/uL (ref 0.00–0.07)
Abs Immature Granulocytes: 0.04 10*3/uL (ref 0.00–0.07)
Basophils Absolute: 0 10*3/uL (ref 0.0–0.1)
Basophils Absolute: 0 10*3/uL (ref 0.0–0.1)
Basophils Relative: 0 %
Basophils Relative: 0 %
Eosinophils Absolute: 0 10*3/uL (ref 0.0–0.5)
Eosinophils Absolute: 0 10*3/uL (ref 0.0–0.5)
Eosinophils Relative: 0 %
Eosinophils Relative: 0 %
HCT: 32 % — ABNORMAL LOW (ref 36.0–46.0)
HCT: 29.1 % — ABNORMAL LOW (ref 36.0–46.0)
Hemoglobin: 10.4 g/dL — ABNORMAL LOW (ref 12.0–15.0)
Hemoglobin: 9.5 g/dL — ABNORMAL LOW (ref 12.0–15.0)
Immature Granulocytes: 1 %
Immature Granulocytes: 0 %
Lymphocytes Relative: 15 %
Lymphocytes Relative: 24 %
Lymphs Abs: 1.3 10*3/uL (ref 0.7–4.0)
Lymphs Abs: 2.2 10*3/uL (ref 0.7–4.0)
MCH: 31 pg (ref 26.0–34.0)
MCH: 30.9 pg (ref 26.0–34.0)
MCHC: 32.5 g/dL (ref 30.0–36.0)
MCHC: 32.6 g/dL (ref 30.0–36.0)
MCV: 95.5 fL (ref 80.0–100.0)
MCV: 94.8 fL (ref 80.0–100.0)
Monocytes Absolute: 0.4 10*3/uL (ref 0.1–1.0)
Monocytes Absolute: 0.8 10*3/uL (ref 0.1–1.0)
Monocytes Relative: 4 %
Monocytes Relative: 9 %
Neutro Abs: 6.9 10*3/uL (ref 1.7–7.7)
Neutro Abs: 6 10*3/uL (ref 1.7–7.7)
Neutrophils Relative %: 80 %
Neutrophils Relative %: 67 %
Platelets: 213 10*3/uL (ref 150–400)
Platelets: 210 10*3/uL (ref 150–400)
RBC: 3.35 MIL/uL — ABNORMAL LOW (ref 3.87–5.11)
RBC: 3.07 MIL/uL — ABNORMAL LOW (ref 3.87–5.11)
RDW: 12.6 % (ref 11.5–15.5)
RDW: 12.7 % (ref 11.5–15.5)
WBC: 8.6 10*3/uL (ref 4.0–10.5)
WBC: 9.1 10*3/uL (ref 4.0–10.5)
nRBC: 0 % (ref 0.0–0.2)
nRBC: 0 % (ref 0.0–0.2)

## 2023-05-22 LAB — COMPREHENSIVE METABOLIC PANEL WITH GFR
ALT: 16 U/L (ref 0–44)
AST: 24 U/L (ref 15–41)
Albumin: 2.8 g/dL — ABNORMAL LOW (ref 3.5–5.0)
Alkaline Phosphatase: 46 U/L (ref 38–126)
Anion gap: 7 (ref 5–15)
BUN: 15 mg/dL (ref 8–23)
CO2: 23 mmol/L (ref 22–32)
Calcium: 7.6 mg/dL — ABNORMAL LOW (ref 8.9–10.3)
Chloride: 108 mmol/L (ref 98–111)
Creatinine, Ser: 0.55 mg/dL (ref 0.44–1.00)
GFR, Estimated: >60 mL/min (ref >60)
Glucose, Bld: 174 mg/dL — ABNORMAL HIGH (ref 70–99)
Potassium: 4.1 mmol/L (ref 3.5–5.1)
Sodium: 138 mmol/L (ref 135–145)
Total Bilirubin: 1 mg/dL (ref 0.0–1.2)
Total Protein: 5.4 g/dL — ABNORMAL LOW (ref 6.5–8.1)

## 2023-05-22 LAB — TYPE AND SCREEN
ABO/RH(D): O POS
Antibody Screen: NEGATIVE

## 2023-05-22 LAB — CBC
HCT: 34.7 % — ABNORMAL LOW (ref 36.0–46.0)
Hemoglobin: 11.2 g/dL — ABNORMAL LOW (ref 12.0–15.0)
MCH: 30.4 pg (ref 26.0–34.0)
MCHC: 32.3 g/dL (ref 30.0–36.0)
MCV: 94.3 fL (ref 80.0–100.0)
Platelets: 210 10*3/uL (ref 150–400)
RBC: 3.68 MIL/uL — ABNORMAL LOW (ref 3.87–5.11)
RDW: 12.7 % (ref 11.5–15.5)
WBC: 9.2 10*3/uL (ref 4.0–10.5)
nRBC: 0 % (ref 0.0–0.2)

## 2023-05-22 LAB — ECHOCARDIOGRAM COMPLETE
Area-P 1/2: 2.29 cm2
Height: 59 in
S' Lateral: 2.1 cm
Weight: 1771.2 [oz_av]

## 2023-05-22 MED ORDER — LEVOTHYROXINE SODIUM 50 MCG PO TABS
50.0000 ug | ORAL_TABLET | Freq: Every day | ORAL | Status: DC
  Administered 2023-05-23 – 2023-05-24 (×2): 50 ug via ORAL
  Filled 2023-05-22 (×2): qty 1

## 2023-05-22 MED ORDER — PANTOPRAZOLE SODIUM 40 MG IV SOLR
40.0000 mg | Freq: Two times a day (BID) | INTRAVENOUS | Status: DC
  Administered 2023-05-22 – 2023-05-25 (×7): 40 mg via INTRAVENOUS
  Filled 2023-05-22 (×7): qty 10

## 2023-05-22 MED ORDER — METOPROLOL TARTRATE 25 MG PO TABS
12.5000 mg | ORAL_TABLET | Freq: Two times a day (BID) | ORAL | Status: DC
  Administered 2023-05-22 – 2023-05-25 (×7): 12.5 mg via ORAL
  Filled 2023-05-22 (×7): qty 1

## 2023-05-22 MED ORDER — ATORVASTATIN CALCIUM 20 MG PO TABS
20.0000 mg | ORAL_TABLET | Freq: Every day | ORAL | Status: DC
  Administered 2023-05-22 – 2023-05-25 (×4): 20 mg via ORAL
  Filled 2023-05-22 (×4): qty 1

## 2023-05-22 MED ORDER — ORAL CARE MOUTH RINSE: 15.0000 mL | OROMUCOSAL | Status: DC | PRN

## 2023-05-22 NOTE — Plan of Care (Signed)
  Problem: Education: Goal: Knowledge of General Education information will improve Description: Including pain rating scale, medication(s)/side effects and non-pharmacologic comfort measures Outcome: Progressing   Problem: Health Behavior/Discharge Planning: Goal: Ability to manage health-related needs will improve Outcome: Progressing   Problem: Clinical Measurements: Goal: Ability to maintain clinical measurements within normal limits will improve Outcome: Progressing Goal: Will remain free from infection Outcome: Progressing Goal: Diagnostic test results will improve Outcome: Progressing Goal: Respiratory complications will improve Outcome: Progressing Goal: Cardiovascular complication will be avoided Outcome: Progressing   Problem: Activity: Goal: Risk for activity intolerance will decrease Outcome: Progressing   Problem: Nutrition: Goal: Adequate nutrition will be maintained Outcome: Progressing   Problem: Coping: Goal: Level of anxiety will decrease Outcome: Progressing   Problem: Elimination: Goal: Will not experience complications related to bowel motility Outcome: Progressing Goal: Will not experience complications related to urinary retention Outcome: Progressing   Problem: Pain Managment: Goal: General experience of comfort will improve and/or be controlled Outcome: Progressing   Problem: Safety: Goal: Ability to remain free from injury will improve Outcome: Progressing   Problem: Skin Integrity: Goal: Risk for impaired skin integrity will decrease Outcome: Progressing   Problem: Education: Goal: Knowledge of disease or condition will improve Outcome: Progressing Goal: Understanding of medication regimen will improve Outcome: Progressing Goal: Individualized Educational Video(s) Outcome: Progressing   Problem: Activity: Goal: Ability to tolerate increased activity will improve Outcome: Progressing   Problem: Cardiac: Goal: Ability to achieve  and maintain adequate cardiopulmonary perfusion will improve Outcome: Progressing   Problem: Health Behavior/Discharge Planning: Goal: Ability to safely manage health-related needs after discharge will improve Outcome: Progressing   Problem: Activity: Goal: Ability to tolerate increased activity will improve Outcome: Progressing   Problem: Clinical Measurements: Goal: Ability to maintain a body temperature in the normal range will improve Outcome: Progressing   Problem: Respiratory: Goal: Ability to maintain adequate ventilation will improve Outcome: Progressing Goal: Ability to maintain a clear airway will improve Outcome: Progressing

## 2023-05-22 NOTE — Progress Notes (Addendum)
 The patient requested to walk to the bathroom to have a BM.  When she moved to the edge of the bed dark red blood was seen on the bed pad.  I placed the Bedside commode next to her bed and helped her pivot onto it.She continued to pass blood into the commode. Charge nurse was notified. MD notified. Labs drawn. Patient placed on bedside monitor for Q 1 hour VS.

## 2023-05-22 NOTE — Progress Notes (Signed)
 Occupational Therapy Evaluation Patient Details Name: Gloria Carr MRN: [id number] DOB: 1927/08/16 Today's Date: 05/22/2023   History of Present Illness   Gloria Carr is a 89 y.o. female with medical history significant of HTN, HLD, macular degeneratio presenting with pneumonia and atrial fibrillation.  Patient reports worsening cough, sputum production as well as shortness of breath over the past 3 to 4 days.     Clinical Impressions Pt was seen for OT/PT evaluation this date. Prior to hospital admission, pt was MODI completed all ADLs, amb with rollator at baseline. Pt was assisted with her IADLs by her son/staff. Pt lives alone in independent living facility, with an accessible entrance to home. Pt presents to acute OT demonstrating impaired ADL performance and functional mobility 2/2 due to current medical issues (See OT problem list for additional functional deficits). On arrival to room pt endorsees she is "sitting in her feces." Upon assessment pt was activity leaking bloody stool- RN aware. Pt currently requires MINA to sit up on the side of the bed, verbal cues to use bed rails to assist. Pt sat on the EOB for ~10 mins demonstrated good sitting balance. Pt completed face washing while in sitting; set up assistance. Returned to bed for pericare MAXA, rolling R/L with use of bed rails. Pt would benefit from skilled OT services to address noted impairments and functional limitations (see below for any additional details) in order to maximize safety and independence while minimizing falls risk and caregiver burden. Prior to our session pt amb into the BR with nursing staff, however this morning when the bloody stool began she remains in bed, unable to get out of bed without an excess amount of bloody stool coming out of her rectum. Anticipated once this medical issue is resolved pt will return to her PLOF. Will continue to assess pt progress with ADL completion and functional mobility. OT will follow  acutely.      If plan is discharge home, recommend the following:   A lot of help with walking and/or transfers;A lot of help with bathing/dressing/bathroom;Assistance with feeding;Assistance with cooking/housework;Assist for transportation     Functional Status Assessment   Patient has had a recent decline in their functional status and demonstrates the ability to make significant improvements in function in a reasonable and predictable amount of time.     Equipment Recommendations   Other (comment) (Defer to next venue of care)     Recommendations for Other Services         Precautions/Restrictions   Precautions Precautions: Fall Recall of Precautions/Restrictions: Intact Restrictions Weight Bearing Restrictions Per Provider Order: No     Mobility Bed Mobility Overal bed mobility: Needs Assistance Bed Mobility: Supine to Sit, Sit to Supine, Rolling Rolling: Min assist   Supine to sit: Min assist Sit to supine: Contact guard assist   General bed mobility comments: MINA rolling, verbal cues for hand placement on bed rails    Transfers                   General transfer comment: NT will continue to assess when pt medically apropriate.      Balance Overall balance assessment: Needs assistance Sitting-balance support: Feet supported, No upper extremity supported Sitting balance-Leahy Scale: Good Sitting balance - Comments: Tolerated sitting on EOB for ~77mins communicating with author and chuck change  ADL either performed or assessed with clinical judgement   ADL Overall ADL's : Needs assistance/impaired Eating/Feeding: Sitting;Set up (MINA opening containers)   Grooming: Wash/dry face;Sitting;Set up       Lower Body Bathing: Maximal assistance;Bed level               Toileting- Clothing Manipulation and Hygiene: Bed level;Maximal assistance         General ADL Comments: MAXA  pericare at bed level, pt able to assist in rolling R/L side. Set up; grooming/feeding      Pertinent Vitals/Pain Pain Assessment Pain Assessment: Faces Faces Pain Scale: No hurt Pain Descriptors / Indicators:  (Visually uncomfortable) Pain Intervention(s): Repositioned     Extremity/Trunk Assessment Upper Extremity Assessment Upper Extremity Assessment: Overall WFL for tasks assessed   Lower Extremity Assessment Lower Extremity Assessment: Overall WFL for tasks assessed;Generalized weakness;Defer to PT evaluation       Communication Communication Communication: No apparent difficulties   Cognition Arousal: Alert Behavior During Therapy: WFL for tasks assessed/performed Cognition: No apparent impairments, No family/caregiver present to determine baseline             OT - Cognition Comments: A/Ox4, unhappy about cold breakfast - RN notified                 Following commands: Intact       Cueing  General Comments   Cueing Techniques: Verbal cues  Continous blood and stool leaking from pt rectum- RN notified   Exercises Exercises: Other exercises Other Exercises Other Exercises: Edu: Role of OT in acute care, rolling hand placement   Shoulder Instructions      Home Living Family/patient expects to be discharged to:: Assisted living Living Arrangements: Alone   Type of Home: Independent living facility Home Access: Ramped entrance     Home Layout: One level     Bathroom Shower/Tub: Estate manager/land agent Accessibility: Yes How Accessible: Accessible via walker Home Equipment: Rollator (4 wheels)          Prior Functioning/Environment Prior Level of Function : Independent/Modified Independent             Mobility Comments: Rollator ADLs Comments: MODI indep, son assist with IADLs    OT Problem List: Decreased strength;Decreased activity tolerance;Decreased knowledge of use of DME or AE;Decreased knowledge of  precautions;Decreased safety awareness;Impaired balance (sitting and/or standing)   OT Treatment/Interventions: Self-care/ADL training;Therapeutic exercise;Energy conservation;Therapeutic activities;Patient/family education;Balance training;DME and/or AE instruction      OT Goals(Current goals can be found in the care plan section)   Acute Rehab OT Goals Patient Stated Goal: Feel better OT Goal Formulation: With patient Time For Goal Achievement: 06/05/23 Potential to Achieve Goals: Good   OT Frequency:  Min 1X/week    Co-evaluation              AM-PAC OT "6 Clicks" Daily Activity     Outcome Measure Help from another person eating meals?: A Little Help from another person taking care of personal grooming?: A Lot Help from another person toileting, which includes using toliet, bedpan, or urinal?: A Lot Help from another person bathing (including washing, rinsing, drying)?: A Lot Help from another person to put on and taking off regular upper body clothing?: None Help from another person to put on and taking off regular lower body clothing?: A Little 6 Click Score: 16   End of Session Nurse Communication: Other (comment) (Events of session)  Activity Tolerance: Treatment limited secondary to  medical complications (Comment) (Continous bloody stool limiting pt's mobility) Patient left: in bed;with call bell/phone within reach;with bed alarm set  OT Visit Diagnosis: Muscle weakness (generalized) (M62.81);Other abnormalities of gait and mobility (R26.89)                Time: 3329-5188 OT Time Calculation (min): 25 min Charges:  OT General Charges $OT Visit: 1 Visit OT Evaluation $OT Eval Moderate Complexity: 1 Mod  Deontre Allsup M.S. OTR/L  05/22/23, 10:27 AM

## 2023-05-22 NOTE — Progress Notes (Signed)
 Progress Note   Patient: Gloria Carr:[id number] DOB: Jun 12, 1927 DOA: 05/21/2023     0 DOS: the patient was seen and examined on 05/22/2023   Brief hospital course:  Gloria Carr is a 89 y.o. female with medical history significant of HTN, HLD, macular degeneratio presenting with pneumonia and atrial fibrillation.  Patient reports worsening cough, sputum production as well as shortness of breath over the past 3 to 4 days.  No fevers or chills.  No nausea or vomiting.  Cough and shortness of breath is persistently worsened.  Denies any prior episodes like this in the past.  No palpitations.  No nausea or vomiting.  No abdominal pain or diarrhea.  Noticed chronic urinary issues.  Denies any dysuria.  Has chronic urinary frequency.  No orthopnea or PND.  Patient denies any known prior cardiac history in the past. Presented to the ER afebrile, hemodynamically stable.  Satting well on room air.  White count 7.7, hemoglobin 13.7, platelets 245, creatinine 0.6.  Glucose 118.  BNP 237.  CT chest with left lower lobe pneumonia with?  Parapneumonic effusion.  Assessment and Plan:  Lower GI bleeding Patient has been seen by gastroenterologist Discussion underway for possible colonoscopy tomorrow We will continue to monitor CBC closely We will transfuse as indicated I have discussed the case with gastroenterologist   PNA (pneumonia) Positive cough, sputum production and shortness of breath with noted left lower lobe pneumonia parapneumonic effusion on CT imaging Continue IV Rocephin azithromycin for infectious coverage Blood and respiratory cultures Minimal hypoxia present Monitor on oxygen   Atrial fibrillation (HCC) Noted atrial fibrillation on multiple EKG Rate controlled at present Will add on 2D echo CHA2DS2-VASc score 5+ Defer anticoagulation for now given age as well as ongoing GI bleed Consult cardiology Monitor   Essential hypertension BP stable  Titrate home regimen      Pure  hypercholesterolemia Cont statin      VTE prophylaxis SCD    Advance Care Planning:   Code Status: Full Code    Consults: Cardiology    Family Communication: No family at the bedside   Subjective:  Seen and examined at bedside this morning Had an episode of GI bleed overnight as well as this morning Hemoglobin at this time does not meet criteria for transfusion  Physical Exam:   Appearance: She is normal weight.  HENT:     Head: Normocephalic and atraumatic.     Nose: Nose normal.     Mouth/Throat:     Mouth: Mucous membranes are moist.  Eyes:     Pupils: Pupils are equal, round, and reactive to light.  Cardiovascular:     Rate and Rhythm: Normal rate and regular rhythm.  Pulmonary:     Effort: Pulmonary effort is normal.  Abdominal:     General: Bowel sounds are normal.  Musculoskeletal:        General: Normal range of motion.  Skin:    General: Skin is warm.  Neurological:     General: No focal deficit present.  Psychiatric:        Mood and Affect: Mood normal.       Vitals:   05/22/23 0610 05/22/23 0620 05/22/23 0730 05/22/23 1534  BP: 135/69  120/60 (!) 108/56  Pulse: 78  83 63  Resp: (!) 24 (!) 25 18 18   Temp: 97.6 F (36.4 C)   98.3 F (36.8 C)  TempSrc: Oral     SpO2: 95%  94% 95%  Weight:  Height:        Data Reviewed: CT scan of the chest reviewed showing findings of pneumonia    Latest Ref Rng & Units 05/22/2023    2:15 PM 05/22/2023   10:21 AM 05/22/2023    3:35 AM  CBC  WBC 4.0 - 10.5 K/uL 9.1  8.6  9.2   Hemoglobin 12.0 - 15.0 g/dL 9.5  40.9  81.1   Hematocrit 36.0 - 46.0 % 29.1  32.0  34.7   Platelets 150 - 400 K/uL 210  213  210        Latest Ref Rng & Units 05/22/2023    3:35 AM 05/21/2023    9:33 AM 01/10/2023    2:21 PM  BMP  Glucose 70 - 99 mg/dL 914  782  75   BUN 8 - 23 mg/dL 15  13  17    Creatinine 0.44 - 1.00 mg/dL 9.56  2.13  0.86   Sodium 135 - 145 mmol/L 138  137  140   Potassium 3.5 - 5.1 mmol/L 4.1  3.6  4.1    Chloride 98 - 111 mmol/L 108  103  102   CO2 22 - 32 mmol/L 23  26  28    Calcium  8.9 - 10.3 mg/dL 7.6  8.4  9.3       Author: Ezzard Holms, MD 05/22/2023 4:55 PM  For on call review [email or web address].

## 2023-05-22 NOTE — Evaluation (Signed)
 Physical Therapy Evaluation Patient Details Name: Gloria Carr MRN: [id number] DOB: 13-May-1927 Today's Date: 05/22/2023  History of Present Illness  Gloria Carr is a 89 y.o. female with medical history significant of HTN, HLD, macular degeneratio presenting with pneumonia and atrial fibrillation.  Patient reports worsening cough, sputum production as well as shortness of breath over the past 3 to 4 days.  Clinical Impression  Pt received in bed and O x 4 nd agreeable to PT/OT co-evel 2/2 pt's medical condition . Pt's PLOF is Ind with ADLs, and amb[place] Son helps with driving and appointments.  PT assessment revealed pt needs Min assist to rollin bed and Min to mod assist for supine <->  sit. Sat on the EOB for 7 mins with CGA of 1. Pt BP 114/43 without symptoms. However, continuous bleeding with clots noted through out the session with discomfort in abdomin. PT recommendations TBD 2/2 to pt is medically evolving and unstable.        If plan is discharge home, recommend the following: A lot of help with walking and/or transfers;A lot of help with bathing/dressing/bathroom;Assistance with cooking/housework;Assist for transportation   Can travel by private vehicle        Equipment Recommendations Other (comment) (TBD later.)  Recommendations for Other Services       Functional Status Assessment Patient has had a recent decline in their functional status and demonstrates the ability to make significant improvements in function in a reasonable and predictable amount of time.     Precautions / Restrictions Precautions Precautions: Fall Recall of Precautions/Restrictions: Intact Restrictions Weight Bearing Restrictions Per Provider Order: No      Mobility  Bed Mobility Overal bed mobility: Needs Assistance Bed Mobility: Supine to Sit, Sit to Supine Rolling: Min assist   Supine to sit: Min assist, Mod assist Sit to supine: Min assist    General bed mobility comments: MINA rolling, verbal cues for hand placement on bed rails    Transfers                   General transfer comment: NT 2/2 to sever rectal Bleeding.    Ambulation/Gait               General Gait Details: NT 2/2 severe retal bleeding pt deferred.  Stairs            Wheelchair Mobility     Tilt Bed    Modified Rankin (Stroke Patients Only)       Balance Overall balance assessment: Needs assistance Sitting-balance support: Feet unsupported Sitting balance-Leahy Scale: Good Sitting balance - Comments: tolerated of sitting with CGA to Sup without LOB                                     Pertinent Vitals/Pain Pain Assessment Pain Assessment: 0-10 Pain Score: 0-No pain Pain Descriptors / Indicators: Discomfort (abdominal and when active bleeding) Pain Intervention(s): Monitored during session    Home Living Family/patient expects to be discharged to:: Private residence (Independent living facility.) Living Arrangements: Alone   Type of Home: Independent living facility Home Access: Ramped entrance       Home Layout: One level Home Equipment: Rollator (4 wheels)      Prior Function Prior Level of Function : Independent/Modified Independent             Mobility Comments: Rollator  ADLs Comments: MODI indep, son assist with IADLs     Extremity/Trunk Assessment   Upper Extremity Assessment Upper Extremity Assessment: Defer to OT evaluation    Lower Extremity Assessment Lower Extremity Assessment: Generalized weakness       Communication   Communication Communication: No apparent difficulties    Cognition Arousal: Alert Behavior During Therapy: WFL for tasks assessed/performed   PT - Cognitive impairments: No apparent impairments                         Following commands: Intact       Cueing Cueing Techniques: Verbal cues     General Comments General  comments (skin integrity, edema, etc.): Continous rectal bleeding through out th esession.    Exercises General Exercises - Lower Extremity Ankle Circles/Pumps: AROM, Both, 10 reps   Assessment/Plan    PT Assessment Patient needs continued PT services  PT Problem List Decreased strength;Decreased activity tolerance       PT Treatment Interventions Gait training;Therapeutic activities;Therapeutic exercise;Balance training;Patient/family education;Functional mobility training    PT Goals (Current goals can be found in the Care Plan section)  Acute Rehab PT Goals Patient Stated Goal: "Pt unable 2/2  pt focuse don bleeding and discomfort. Time For Goal Achievement: 06/12/23 Potential to Achieve Goals: Fair    Frequency Min 2X/week     Co-evaluation PT/OT/SLP Co-Evaluation/Treatment: Yes Reason for Co-Treatment: For patient/therapist safety;Other (comment) (due to severe rectal  bleeding)           AM-PAC PT "6 Clicks" Mobility  Outcome Measure Help needed turning from your back to your side while in a flat bed without using bedrails?: A Little Help needed moving from lying on your back to sitting on the side of a flat bed without using bedrails?: A Little Help needed moving to and from a bed to a chair (including a wheelchair)?: A Little Help needed standing up from a chair using your arms (e.g., wheelchair or bedside chair)?: A Little Help needed to walk in hospital room?: A Lot Help needed climbing 3-5 steps with a railing? : A Lot 6 Click Score: 16    End of Session         PT Visit Diagnosis: Muscle weakness (generalized) (M62.81);Other (comment) (gait and balance to be determined with further eval.)    Time: 4098-1191 PT Time Calculation (min) (ACUTE ONLY): 25 min   Charges:   PT Evaluation $PT Eval High Complexity: 1 High   PT General Charges $$ ACUTE PT VISIT: 1 Visit         Norabelle Kondo PT DPT 10:40 AM,05/22/23

## 2023-05-22 NOTE — Consult Note (Addendum)
 Consultation  Referring Provider: Hospitalist team     Admit date: 05/21/2023 Consult date    5/111/2025     Reason for Consultation: Hematochezia             HPI:   Gloria Carr is a 89 y.o. lady with history of arthritis, macular degeneration, and spinal stenosis here for pneumonia. Last night she developed an episode of hematochezia and has had some smaller episodes today, mainly bright red with clots. She does not remember when her last colonoscopy was done. No significant abdominal surgeries. She does have constipation issues. She has diverticulosis seen on CT imaging. No abdominal pain.  Past Medical History:  Diagnosis Date   Allergy    Arthritis    Closed fracture of left superior pubic ramus, with routine healing, subsequent encounter 08/24/2016   E. coli UTI 04/08/2022   Glaucoma    Glaucoma    History of chickenpox    History of recurrent UTIs    Hypertension    Macular degeneration    Spinal stenosis    Thyroid  disease     Past Surgical History:  Procedure Laterality Date   BILATERAL CARPAL TUNNEL RELEASE  1999-2000   CATARACT EXTRACTION, BILATERAL  2013    Family History  Problem Relation Age of Onset   Stroke Mother    Cancer Mother 18       colon   Cancer Father        bone   Cancer Son        Jaw   Stroke Sister    Breast cancer Neg Hx     Social History   Tobacco Use   Smoking status: Former   Smokeless tobacco: Never  Vaping Use   Vaping status: Never Used  Substance Use Topics   Alcohol use: Yes    Alcohol/week: 5.0 standard drinks of alcohol    Types: 5 Standard drinks or equivalent per week    Comment: with dinner   Drug use: No    Prior to Admission medications   Medication Sig Start Date End Date Taking? Authorizing Provider  amLODipine  (NORVASC ) 5 MG tablet TAKE 1 TABLET BY MOUTH DAILY. DOSE INCREASE. 01/10/23  Yes Thersia Flax, MD  atorvastatin  (LIPITOR) 20 MG tablet Take 1 tablet (20 mg total) by mouth daily. 07/09/22  Yes  Thersia Flax, MD  Cholecalciferol (VITAMIN D3) 50 MCG (2000 UT) capsule Take 2,000 Units by mouth daily.   Yes [provider]  diclofenac Sodium (VOLTAREN) 1 % GEL Apply 2 g topically 4 (four) times daily.   Yes [provider]  latanoprost (XALATAN) 0.005 % ophthalmic solution Place 1 drop into the left eye at bedtime. 07/08/14  Yes [provider]  levothyroxine  (SYNTHROID ) 50 MCG tablet TAKE 1 TABLET EVERY DAY ON EMPTY STOMACHWITH A GLASS OF WATER AT LEAST 30-60 MINBEFORE BREAKFAST 01/10/23  Yes Thersia Flax, MD  Multiple Vitamins-Minerals (PRESERVISION AREDS 2 PO) Take by mouth.   Yes [provider]  Propylene Glycol (SYSTANE BALANCE OP) Apply to eye.   Yes [provider]  denosumab  (PROLIA ) 60 MG/ML SOSY injection Inject 60 mg into the skin every 6 (six) months.    [provider]  traMADol  (ULTRAM ) 50 MG tablet Take 50 mg by mouth 3 (three) times daily as needed. Patient not taking: Reported on 05/21/2023    [provider]    Current Facility-Administered Medications  Medication Dose Route Frequency Provider Last Rate Last Admin  0.9 %  sodium chloride  infusion   Intravenous Continuous Corrinne Din, MD 50 mL/hr at 05/22/23 0400 Infusion Verify at 05/22/23 0400   acetaminophen (TYLENOL) tablet 650 mg  650 mg Oral Q6H PRN Newton, Steven J, MD       atorvastatin  (LIPITOR) tablet 20 mg  20 mg Oral Daily Florette Hurry, NP   20 mg at 05/22/23 1145   azithromycin (ZITHROMAX) 500 mg in sodium chloride  0.9 % 250 mL IVPB  500 mg Intravenous Q24H Newton, Steven J, MD       cefTRIAXone (ROCEPHIN) 2 g in sodium chloride  0.9 % 100 mL IVPB  2 g Intravenous Q24H Newton, Steven J, MD 200 mL/hr at 05/22/23 1047 2 g at 05/22/23 1047   feeding supplement (ENSURE ENLIVE / ENSURE PLUS) liquid 237 mL  237 mL Oral BID BM Corrinne Din, MD   237 mL at 05/22/23 1049   [START ON 05/23/2023] levothyroxine  (SYNTHROID ) tablet 50  mcg  50 mcg Oral QAC breakfast Florette Hurry, NP       metoprolol tartrate (LOPRESSOR) tablet 12.5 mg  12.5 mg Oral BID Florette Hurry, NP   12.5 mg at 05/22/23 1145   ondansetron  (ZOFRAN ) tablet 4 mg  4 mg Oral Q6H PRN Corrinne Din, MD       Or   ondansetron  (ZOFRAN ) injection 4 mg  4 mg Intravenous Q6H PRN Newton, Steven J, MD   4 mg at 05/22/23 1610   Oral care mouth rinse  15 mL Mouth Rinse PRN Elisabeth Guild, NP       pantoprazole (PROTONIX) injection 40 mg  40 mg Intravenous Q12H Elisabeth Guild, NP   40 mg at 05/22/23 1044    Allergies as of 05/21/2023 - Review Complete 05/21/2023  Allergen Reaction Noted   Sulfa antibiotics Other (See Comments) 06/24/2014     Review of Systems:    All systems reviewed and negative except where noted in HPI.  Review of Systems  Constitutional:  Negative for chills and fever.  Cardiovascular:  Negative for chest pain.  Gastrointestinal:  Positive for blood in stool. Negative for abdominal pain.  Musculoskeletal:  Positive for joint pain.  Skin:  Negative for rash.  Neurological:  Negative for focal weakness.  Psychiatric/Behavioral:  Negative for substance abuse.   All other systems reviewed and are negative.     Physical Exam:  Vital signs in last 24 hours: Temp:  [97.4 F (36.3 C)-97.8 F (36.6 C)] 97.6 F (36.4 C) (05/11 0610) Pulse Rate:  [72-83] 83 (05/11 0730) Resp:  [12-30] 18 (05/11 0730) BP: (71-178)/(45-90) 120/60 (05/11 0730) SpO2:  [92 %-98 %] 94 % (05/11 0730)   General:   Pleasant in NAD Head:  Normocephalic and atraumatic. Ears:  Normal auditory acuity. Mouth: Mucosa pink moist, no lesions. Neck:  Supple; no masses felt Lungs: No respiratory distress Abdomen:   Flat, soft, nondistended, nontender Msk:  Symmetrical without gross deformities. Neurologic:  Alert and  oriented x4;  No focal deficits Skin:  Warm, dry, pink without significant lesions or rashes. Psych:  Alert and  cooperative. Normal affect.  LAB RESULTS: Recent Labs    05/21/23 0933 05/22/23 0335 05/22/23 1021  WBC 7.7 9.2 8.6  HGB 13.7 11.2* 10.4*  HCT 43.5 34.7* 32.0*  PLT 245 210 213   BMET Recent Labs    05/21/23 0933 05/22/23 0335  NA 137 138  K 3.6 4.1  CL 103 108  CO2 26 23  GLUCOSE  118* 174*  BUN 13 15  CREATININE 0.60 0.55  CALCIUM  8.4* 7.6*   LFT Recent Labs    05/22/23 0335  PROT 5.4*  ALBUMIN 2.8*  AST 24  ALT 16  ALKPHOS 46  BILITOT 1.0   PT/INR No results for input(s): "LABPROT", "INR" in the last 72 hours.  STUDIES: CT CHEST WO CONTRAST Result Date: 05/21/2023 CLINICAL DATA:  Respiratory illness EXAM: CT CHEST WITHOUT CONTRAST TECHNIQUE: Multidetector CT imaging of the chest was performed following the standard protocol without IV contrast. RADIATION DOSE REDUCTION: This exam was performed according to the departmental dose-optimization program which includes automated exposure control, adjustment of the mA and/or kV according to patient size and/or use of iterative reconstruction technique. COMPARISON:  Chest x-ray performed May 21, 2023 FINDINGS: Cardiovascular: Heart is enlarged. Calcific atherosclerosis is present in the left coronary territory. Atherosclerotic changes are also present in the thoracic aorta. Mediastinum/Nodes: Mildly enlarged lymph nodes are present which may be reactive. Lungs/Pleura: There is a focus of consolidation in the medial posterior aspect of the left lower lobe with a small parapneumonic effusion. Mucous plugging is also present within the left lower lobe airways. Upper Abdomen: Nothing significant. Musculoskeletal: Degenerative changes in the imaged osseous structures to include both shoulders. IMPRESSION: 1. Left lower lobe pneumonia with suspected parapneumonic effusion. At this is radiographically occult, consideration should be given toward follow-up CT scan in 1-2 months to ensure clearance and absence of an underlying process.  Electronically Signed   By: Reagan Camera M.D.   On: 05/21/2023 14:24   DG Chest 2 View Result Date: 05/21/2023 CLINICAL DATA:  Productive cough and fatigue. EXAM: CHEST - 2 VIEW COMPARISON:  12/22/2018 FINDINGS: Opacity at the left base at least partially from pleural fluid. The underlying lung is obscured. No edema or pneumothorax. Chronic cardiomegaly. Severe glenohumeral arthropathy especially on the left with extensive calcification. IMPRESSION: Left pleural effusion obscuring the left base, possible underlying pneumonia given the history. Consider CT given the degree of fluid. Electronically Signed   By: Ronnette Coke M.D.   On: 05/21/2023 10:40       Impression / Plan:    89 y.o. lady with history of arthritis, macular degeneration, and spinal stenosis here for pneumonia who has developed hematochezia. Suspect diverticular bleeding but can't rule out malignancy. Discussed risks/benefits of colonoscopy with patient and son and they would like to discuss among themselves if they would be interested a colonoscopy given her age  - daily CBC's - hold any lovenox - monitor for recurrent bleeding - if hemodynamically significant bleeding then would get a CTA - will switch to clear liquid diet for now - will readdress with her tomorrow about potential colonoscopy. Dr. Ole Berkeley will start covering starting tomorrow morning  Please call with any questions or concerns.  Olivia Bevel MD, MPH [facility]

## 2023-05-22 NOTE — Care Management Obs Status (Signed)
 MEDICARE OBSERVATION STATUS NOTIFICATION   Patient Details  Name: Marjarie Mcfarlain MRN: [id number] Date of Birth: 11-Aug-1927   Medicare Observation Status Notification Given:  Yes    Anise Kerns 05/22/2023, 1:25 PM

## 2023-05-22 NOTE — Consult Note (Addendum)
 Cardiology Consult    Patient ID: Gloria Carr MRN: [id number], DOB/AGE: 03-11-1927   Admit date: 05/21/2023 Date of Consult: 05/22/2023  Primary Physician: Thersia Flax, MD Primary Cardiologist: Belva Boyden, MD - new Requesting Provider: Lynnell Sato, MD  Patient Profile    Helga Alamilla is a 89 y.o. female with a history of HTN, HL, macular degeneration, hypothyroidism, peripheral neuropathy, cervical DJD, spinal stenosis, glaucoma, chronic low back pain, occipital neuralgia, OA, overactive bladder, and recurrent UTIs, who is being seen today for the evaluation of Afib in the setting of PNA at the request of Dr. Mariella Shore.  Past Medical History   Subjective  Past Medical History:  Diagnosis Date   Allergy    Arthritis    Closed fracture of left superior pubic ramus, with routine healing, subsequent encounter 08/24/2016   E. coli UTI 04/08/2022   Glaucoma    History of chickenpox    History of recurrent UTIs    Hypertension    Macular degeneration    Thyroid  disease     Past Surgical History:  Procedure Laterality Date   BILATERAL CARPAL TUNNEL RELEASE  1999-2000   CATARACT EXTRACTION, BILATERAL  2013     Allergies  Allergies  Allergen Reactions   Sulfa Antibiotics Other (See Comments)    Childhood reaction        History of Present Illness   89 y.o. female with a history of HTN, HL, macular degeneration, hypothyroidism, peripheral neuropathy, cervical DJD, spinal stenosis, glaucoma, chronic low back pain, occipital neuralgia, OA, overactive bladder, and recurrent UTIs.  She has no prior cardiac history.  Over the past week, she has had productive cough (clear sputum), malaise, and dyspnea.  With coughing, she's noted intermittent palpitations.  No fevers/chills.  Due to progressive cough, dyspnea, and palpitations, she called EMS and was transported to the [facility] ED on 05/21/2023.  In the ED, she was afebrile and mildly hypertensive at 144/76.  She was saturating at 95% on  room air.  Twelve-lead ECG showed coarse Afib @ 76 w/o acute ST/T changes.  Lab work notable for nl H/H of 13.7/43.5, BNP of 237.3, troponin of 9  9.  Respiratory panel negative.  Chest x-ray showed left pleural effusion obscuring the left base with possible underlying pneumonia.  CT chest showed left lower lobe pneumonia with suspected parapneumonic effusion with recommendation for follow-up imaging in 1 to 2 months.  She was placed on azithromycin, ceftriaxone and DVT prophylaxis dose Lovenox and admitted to telemetry.  Nursing note from 3 AM indicates that patient had dark red blood in her stool.  H&H this morning lower at 11.2/34.7  10.4/32.0 - lovenox d/c'd and intravenous Protonix ordered.  She says that she's never had BRBPR before, though has continued to have episodes this AM.  She remains in coarse afib w/ HRs in the 80's, currently asymptomatic.  Inpatient Medications   Subjective    feeding supplement  237 mL Oral BID BM   pantoprazole (PROTONIX) IV  40 mg Intravenous Q12H    Family History    Family History  Problem Relation Age of Onset   Stroke Mother    Cancer Mother 80       colon   Cancer Father        bone   Cancer Son        Jaw   Stroke Sister    Breast cancer Neg Hx    She indicated that her mother is deceased. She indicated that her  father is deceased. She indicated that the status of her sister is unknown. She indicated that her son is deceased. She indicated that the status of her neg hx is unknown.   Social History    Social History   Socioeconomic History   Marital status: Widowed    Spouse name: Not on file   Number of children: Not on file   Years of education: Not on file   Highest education level: Not on file  Occupational History   Not on file  Tobacco Use   Smoking status: Former   Smokeless tobacco: Never  Vaping Use   Vaping status: Never Used  Substance and Sexual Activity   Alcohol use: Yes    Alcohol/week: 5.0 standard drinks of  alcohol    Types: 5 Standard drinks or equivalent per week    Comment: with dinner   Drug use: No   Sexual activity: Not Currently    Birth control/protection: Post-menopausal  Other Topics Concern   Not on file  Social History Narrative   Not on file   Social Drivers of Health   Financial Resource Strain: Low Risk  (01/27/2023)   Received from [facility]   Overall Financial Resource Strain (CARDIA)    Difficulty of Paying Living Expenses: Not hard at all  Food Insecurity: No Food Insecurity (05/21/2023)   Hunger Vital Sign    Worried About Running Out of Food in the Last Year: Never true    Ran Out of Food in the Last Year: Never true  Transportation Needs: No Transportation Needs (05/21/2023)   PRAPARE - Administrator, Civil Service (Medical): No    Lack of Transportation (Non-Medical): No  Physical Activity: Insufficiently Active (01/03/2023)   Exercise Vital Sign    Days of Exercise per Week: 2 days    Minutes of Exercise per Session: 40 min  Stress: No Stress Concern Present (01/03/2023)   [employer] of Occupational Health - Occupational Stress Questionnaire    Feeling of Stress : Only a little  Social Connections: Moderately Isolated (05/21/2023)   Social Connection and Isolation Panel [NHANES]    Frequency of Communication with Friends and Family: More than three times a week    Frequency of Social Gatherings with Friends and Family: More than three times a week    Attends Religious Services: More than 4 times per year    Active Member of [employer] or Organizations: No    Attends Banker Meetings: Never    Marital Status: Widowed  Intimate Partner Violence: Not At Risk (05/21/2023)   Humiliation, Afraid, Rape, and Kick questionnaire    Fear of Current or Ex-Partner: No    Emotionally Abused: No    Physically Abused: No    Sexually Abused: No     Review of Systems    General:  +++ malaise.  No chills, fever, night  sweats or weight changes.  Cardiovascular:  No chest pain, dyspnea on exertion, edema, orthopnea, +++ palpitations, no paroxysmal nocturnal dyspnea. Dermatological: No rash, lesions/masses Respiratory: +++ productive cough, +++ dyspnea Urologic: No hematuria, dysuria Abdominal:   No nausea, vomiting, diarrhea, +++ bright red blood per rectum overnight, no melena, or hematemesis Neurologic:  No visual changes, wkns, changes in mental status. All other systems reviewed and are otherwise negative except as noted above.     Objective   Physical Exam    Blood pressure 120/60, pulse 83, temperature 97.6 F (36.4 C), temperature source Oral, resp.  rate 18, height 4\' 11"  (1.499 m), weight 50.2 kg, SpO2 94%.  General: Pleasant, NAD Psych: Normal affect. Neuro: Alert and oriented X 3. Moves all extremities spontaneously. HEENT: Normal  Neck: Supple without bruits or JVD. Lungs:  Resp regular and unlabored, bibasilar crackles. Heart: IR, IR, no s3, s4, or murmurs. Abdomen: Soft, non-tender, non-distended, BS + x 4.  Extremities: No clubbing, cyanosis or edema. DP/PT2+, Radials 2+ and equal bilaterally.  Labs    Cardiac Enzymes Recent Labs  Lab 05/21/23 0933 05/21/23 1120  TROPONINIHS 9 9     BNP    Component Value Date/Time   BNP 237.3 (H) 05/21/2023 0933    Lab Results  Component Value Date   WBC 9.2 05/22/2023   HGB 11.2 (L) 05/22/2023   HCT 34.7 (L) 05/22/2023   MCV 94.3 05/22/2023   PLT 210 05/22/2023    Recent Labs  Lab 05/22/23 0335  NA 138  K 4.1  CL 108  CO2 23  BUN 15  CREATININE 0.55  CALCIUM  7.6*  PROT 5.4*  BILITOT 1.0  ALKPHOS 46  ALT 16  AST 24  GLUCOSE 174*   Lab Results  Component Value Date   CHOL 155 01/10/2023   HDL 81.70 01/10/2023   LDLCALC 60 01/10/2023   TRIG 71.0 01/10/2023   Lab Results  Component Value Date   TSH 1.65 01/10/2023      Radiology Studies    CT CHEST WO CONTRAST Result Date: 05/21/2023 CLINICAL DATA:   Respiratory illness EXAM: CT CHEST WITHOUT CONTRAST TECHNIQUE: Multidetector CT imaging of the chest was performed following the standard protocol without IV contrast. RADIATION DOSE REDUCTION: This exam was performed according to the departmental dose-optimization program which includes automated exposure control, adjustment of the mA and/or kV according to patient size and/or use of iterative reconstruction technique. COMPARISON:  Chest x-ray performed May 21, 2023 FINDINGS: Cardiovascular: Heart is enlarged. Calcific atherosclerosis is present in the left coronary territory. Atherosclerotic changes are also present in the thoracic aorta. Mediastinum/Nodes: Mildly enlarged lymph nodes are present which may be reactive. Lungs/Pleura: There is a focus of consolidation in the medial posterior aspect of the left lower lobe with a small parapneumonic effusion. Mucous plugging is also present within the left lower lobe airways. Upper Abdomen: Nothing significant. Musculoskeletal: Degenerative changes in the imaged osseous structures to include both shoulders. IMPRESSION: 1. Left lower lobe pneumonia with suspected parapneumonic effusion. At this is radiographically occult, consideration should be given toward follow-up CT scan in 1-2 months to ensure clearance and absence of an underlying process. Electronically Signed   By: Reagan Camera M.D.   On: 05/21/2023 14:24   DG Chest 2 View Result Date: 05/21/2023 CLINICAL DATA:  Productive cough and fatigue. EXAM: CHEST - 2 VIEW COMPARISON:  12/22/2018 FINDINGS: Opacity at the left base at least partially from pleural fluid. The underlying lung is obscured. No edema or pneumothorax. Chronic cardiomegaly. Severe glenohumeral arthropathy especially on the left with extensive calcification. IMPRESSION: Left pleural effusion obscuring the left base, possible underlying pneumonia given the history. Consider CT given the degree of fluid. Electronically Signed   By: Ronnette Coke M.D.   On: 05/21/2023 10:40      ECG & Cardiac Imaging    coarse Afib @ 76 w/o acute ST/T changes - personally reviewed.  Assessment & Plan    1.  LLL PNA:  3-5 day h/o productive cough and dyspnea.  CT chest w/ LLL PNA.  Also noted to  be in Afib (see #2).  Abx per IM.  2.  Persistent Afib:  in setting of PNA, pt noted to be in rate-controlled afib, trending in the 80's.  Asymptomatic.  Lytes ok.  F/u TSH and echo.  Will add low-dose ? blocker.  CHA2DS2VASc = 4, however she has developed BRBPR and anemia since admission, therefor, will forego anticoagulation at this time.  3.  BRBPR/Anemia:  Pt w/ episodes of BRBPR since arrival to hospital.  Denies any prior history.  H/H down from 13.7/43.5  10.4/32 this AM.  Follow.  Avoiding anticoagulation.  PPI per IM.  GI eval pending.  4.  Primary HTN:  stable.  Follow in setting of anemia.  5.  HL:  on atorvastatin  @ home - resume.  6.  Hypothyroidism:  on levothyroxine  50 daily.  Resume.  F/u TSH in setting of new afib.  Risk Assessment/Risk Scores:          CHA2DS2-VASc Score = 4   This indicates a 4.8% annual risk of stroke. The patient's score is based upon: CHF History: 0 HTN History: 1 Diabetes History: 0 Stroke History: 0 Vascular Disease History: 0 Age Score: 2 Gender Score: 1     Signed, Laneta Pintos, NP 05/22/2023, 8:47 AM  For questions or updates, please contact   Please consult [email or web address] for contact info under Cardiology/STEMI.

## 2023-05-23 DIAGNOSIS — K922 Gastrointestinal hemorrhage, unspecified: Secondary | ICD-10-CM

## 2023-05-23 DIAGNOSIS — K625 Hemorrhage of anus and rectum: Secondary | ICD-10-CM

## 2023-05-23 DIAGNOSIS — J189 Pneumonia, unspecified organism: Secondary | ICD-10-CM | POA: Diagnosis not present

## 2023-05-23 LAB — EXPECTORATED SPUTUM ASSESSMENT W GRAM STAIN, RFLX TO RESP C

## 2023-05-23 LAB — CBC WITH DIFFERENTIAL/PLATELET
Abs Immature Granulocytes: 0.03 10*3/uL (ref 0.00–0.07)
Basophils Absolute: 0 10*3/uL (ref 0.0–0.1)
Basophils Relative: 0 %
Eosinophils Absolute: 0 10*3/uL (ref 0.0–0.5)
Eosinophils Relative: 0 %
HCT: 26.4 % — ABNORMAL LOW (ref 36.0–46.0)
Hemoglobin: 8.4 g/dL — ABNORMAL LOW (ref 12.0–15.0)
Immature Granulocytes: 0 %
Lymphocytes Relative: 29 %
Lymphs Abs: 2.5 10*3/uL (ref 0.7–4.0)
MCH: 30.9 pg (ref 26.0–34.0)
MCHC: 31.8 g/dL (ref 30.0–36.0)
MCV: 97.1 fL (ref 80.0–100.0)
Monocytes Absolute: 0.8 10*3/uL (ref 0.1–1.0)
Monocytes Relative: 9 %
Neutro Abs: 5.1 10*3/uL (ref 1.7–7.7)
Neutrophils Relative %: 62 %
Platelets: 185 10*3/uL (ref 150–400)
RBC: 2.72 MIL/uL — ABNORMAL LOW (ref 3.87–5.11)
RDW: 12.9 % (ref 11.5–15.5)
WBC: 8.4 10*3/uL (ref 4.0–10.5)
nRBC: 0 % (ref 0.0–0.2)

## 2023-05-23 LAB — TSH: TSH: 3.209 u[IU]/mL (ref 0.350–4.500)

## 2023-05-23 NOTE — Plan of Care (Signed)
  Problem: Education: Goal: Knowledge of General Education information will improve Description: Including pain rating scale, medication(s)/side effects and non-pharmacologic comfort measures 05/23/2023 0512 by Marlana Silvan, RN Outcome: Progressing 05/23/2023 0512 by Marlana Silvan, RN Outcome: Progressing   Problem: Health Behavior/Discharge Planning: Goal: Ability to manage health-related needs will improve Outcome: Progressing   Problem: Clinical Measurements: Goal: Ability to maintain clinical measurements within normal limits will improve Outcome: Progressing Goal: Will remain free from infection Outcome: Progressing Goal: Diagnostic test results will improve Outcome: Progressing Goal: Respiratory complications will improve Outcome: Progressing Goal: Cardiovascular complication will be avoided Outcome: Progressing   Problem: Activity: Goal: Risk for activity intolerance will decrease Outcome: Progressing   Problem: Nutrition: Goal: Adequate nutrition will be maintained Outcome: Progressing   Problem: Coping: Goal: Level of anxiety will decrease Outcome: Progressing   Problem: Elimination: Goal: Will not experience complications related to bowel motility Outcome: Progressing Goal: Will not experience complications related to urinary retention Outcome: Progressing   Problem: Pain Managment: Goal: General experience of comfort will improve and/or be controlled Outcome: Progressing   Problem: Safety: Goal: Ability to remain free from injury will improve Outcome: Progressing   Problem: Skin Integrity: Goal: Risk for impaired skin integrity will decrease Outcome: Progressing   Problem: Education: Goal: Knowledge of disease or condition will improve Outcome: Progressing Goal: Understanding of medication regimen will improve Outcome: Progressing Goal: Individualized Educational Video(s) Outcome: Progressing   Problem: Activity: Goal: Ability to  tolerate increased activity will improve Outcome: Progressing   Problem: Cardiac: Goal: Ability to achieve and maintain adequate cardiopulmonary perfusion will improve Outcome: Progressing   Problem: Health Behavior/Discharge Planning: Goal: Ability to safely manage health-related needs after discharge will improve Outcome: Progressing   Problem: Activity: Goal: Ability to tolerate increased activity will improve Outcome: Progressing   Problem: Clinical Measurements: Goal: Ability to maintain a body temperature in the normal range will improve Outcome: Progressing   Problem: Respiratory: Goal: Ability to maintain adequate ventilation will improve Outcome: Progressing Goal: Ability to maintain a clear airway will improve Outcome: Progressing

## 2023-05-23 NOTE — Progress Notes (Signed)
 Rounding Note    Patient Name: Gloria Carr Date of Encounter: 05/23/2023  [facility] Cardiologist: Timothy Gollan, MD   Subjective   Patient reports some confusion this morning about where she was, redirected by nursing staff and now feeling back to baseline. Remains in rate controlled atrial fibrillation. She reports improvement in breathing. Denies chest pain and palpitations. She is unsure if she has had further BRBPR. Hgb 10.4>9.5.   Inpatient Medications    Scheduled Meds:  atorvastatin   20 mg Oral Daily   feeding supplement  237 mL Oral BID BM   levothyroxine   50 mcg Oral QAC breakfast   metoprolol tartrate  12.5 mg Oral BID   pantoprazole (PROTONIX) IV  40 mg Intravenous Q12H   Continuous Infusions:  azithromycin 250 mL/hr at 05/22/23 1656   cefTRIAXone (ROCEPHIN)  IV Stopped (05/22/23 1134)   PRN Meds: acetaminophen, ondansetron  **OR** ondansetron  (ZOFRAN ) IV, mouth rinse   Vital Signs    Vitals:   05/23/23 0200 05/23/23 0400 05/23/23 0431 05/23/23 0733  BP: 121/66   121/61  Pulse:    80  Resp: (!) 22 15  18   Temp:   (!) 96.3 F (35.7 C) 97.6 F (36.4 C)  TempSrc:      SpO2:    91%  Weight:      Height:        Intake/Output Summary (Last 24 hours) at 05/23/2023 0911 Last data filed at 05/23/2023 0400 Gross per 24 hour  Intake 1055.88 ml  Output 300 ml  Net 755.88 ml      05/21/2023    9:26 AM 01/10/2023    1:28 PM 01/03/2023   11:20 AM  Last 3 Weights  Weight (lbs) 110 lb 11.2 oz 104 lb 103 lb 4 oz  Weight (kg) 50.213 kg 47.174 kg 46.834 kg      Telemetry    Atrial fibrillation with rate 60-80 bpm - Personally Reviewed  Physical Exam   GEN: No acute distress.   Neck: No JVD Cardiac: IRIR, no murmurs, rubs, or gallops.  Respiratory: Coarse breath sounds with crackles GI: Soft, nontender, non-distended  MS: No edema; No deformity. Neuro:  Nonfocal  Psych: Normal affect   Labs    High Sensitivity Troponin:   Recent Labs   Lab 05/21/23 0933 05/21/23 1120  TROPONINIHS 9 9     Chemistry Recent Labs  Lab 05/21/23 0933 05/22/23 0335  NA 137 138  K 3.6 4.1  CL 103 108  CO2 26 23  GLUCOSE 118* 174*  BUN 13 15  CREATININE 0.60 0.55  CALCIUM  8.4* 7.6*  PROT  --  5.4*  ALBUMIN  --  2.8*  AST  --  24  ALT  --  16  ALKPHOS  --  46  BILITOT  --  1.0  GFRNONAA >60 >60  ANIONGAP 8 7    Lipids No results for input(s): "CHOL", "TRIG", "HDL", "LABVLDL", "LDLCALC", "CHOLHDL" in the last 168 hours.  Hematology Recent Labs  Lab 05/22/23 0335 05/22/23 1021 05/22/23 1415  WBC 9.2 8.6 9.1  RBC 3.68* 3.35* 3.07*  HGB 11.2* 10.4* 9.5*  HCT 34.7* 32.0* 29.1*  MCV 94.3 95.5 94.8  MCH 30.4 31.0 30.9  MCHC 32.3 32.5 32.6  RDW 12.7 12.6 12.7  PLT 210 213 210   Thyroid   Recent Labs  Lab 05/23/23 0418  TSH 3.209    BNP Recent Labs  Lab 05/21/23 0933  BNP 237.3*    DDimer No results for  input(s): "DDIMER" in the last 168 hours.   Radiology    CT CHEST WO CONTRAST Result Date: 05/21/2023 IMPRESSION: 1. Left lower lobe pneumonia with suspected parapneumonic effusion. At this is radiographically occult, consideration should be given toward follow-up CT scan in 1-2 months to ensure clearance and absence of an underlying process. Electronically Signed   By: Reagan Camera M.D.   On: 05/21/2023 14:24   DG Chest 2 View Result Date: 05/21/2023 IMPRESSION: Left pleural effusion obscuring the left base, possible underlying pneumonia given the history. Consider CT given the degree of fluid. Electronically Signed   By: Ronnette Coke M.D.   On: 05/21/2023 10:40   Cardiac Studies   05/22/2022 Echo complete 1. Left ventricular ejection fraction, by estimation, is 60 to 65%. The  left ventricle has normal function. The left ventricle has no regional  wall motion abnormalities. Left ventricular diastolic parameters are  indeterminate.   2. Right ventricular systolic function is normal. The right ventricular   size is normal. There is mildly elevated pulmonary artery systolic  pressure. The estimated right ventricular systolic pressure is 44.4 mmHg.   3. The mitral valve is normal in structure. Moderate to severe mitral  valve regurgitation. No evidence of mitral stenosis.   4. Tricuspid valve regurgitation is moderate.   5. The aortic valve is normal in structure. Aortic valve regurgitation is  not visualized. No aortic stenosis is present.   6. The inferior vena cava is normal in size with greater than 50%  respiratory variability, suggesting right atrial pressure of 3 mmHg.   Patient Profile     89 y.o. female with history of hypertension, hyperlipidemia, macular degeneration, hypothyroidism, peripheral neuropathy, cervical DJD, spondylosis gnosis, glaucoma, chronic low back pain, occipital neuralgia, osteoarthritis, overactive bladder, and recurrent UTIs, who presented 5/10 with progressive cough, dyspnea, and palpitations.  Found to have left lower lobe pneumonia and atrial fibrillation with RVR.  Also noted to have bright red blood per rectum per RN, hemoglobin 10.4 > 9.5.  Assessment & Plan    Pneumonia - 3-5 day history of productive cough and dyspnea - CT chest showed LLL pneumonia - Likely contributing to afib - Antibiotics per IM  Persistent atrial fibrillation - On admission, found to be in rate controlled atrial fibrillation in the setting of pneumonia, unclear chronicity - Patient is asymptomatic from a cardiac perspective - Remains in atrial fibrillation with rate 60s-80s bpm - Echo showed EF 60-65% with mildly elevated pulmonary artery systolic pressure, moderate to severe MR - Continued on metoprolol tartrate 12.5 mg twice daily for rate control - CHA2DS2VASc 4 - No DOAC at this time due to BRBPR and worsening anemia  BRBPR Anemia - Episodes of BRBPR since admission - Denies prior history - Hgb continues to trend down 10.4>9.5 - No anticoagulation as above - PPI per  IM - GI evaluation today for potential colonoscopy  Primary hypertension - BP stable, will continue to follow  Hyperlipidemia - Continue PTA atorvastatin   Hypothyroidism - TSH upper limits of normal - Continue PTA levothyroxine   Moderate to severe MR - Noted on echo this admission - Continue to follow as OP  For questions or updates, please contact [facility] Please consult [email or web address] for contact info under        Signed, Brodie Cannon, PA-C  05/23/2023, 9:11 AM

## 2023-05-23 NOTE — Progress Notes (Signed)
 Gloria Sink, MD [facility]   [address], [address] [place], [place] [address] Phone: [phone] Fax : [phone]   Subjective: The patient son is in the room with the patient who reports that the patient has not had any visible rectal bleeding and the patient reports only 1 bowel movement today.  The patient's hemoglobin has steadily gone down.  She denies any abdominal pain.  She had spoken to the GI doctor on-call this weekend about whether or not she would like to proceed with a colonoscopy and is still undecided.  The patient's son feels that it would give her some information as to why she has been having alternating diarrhea and constipation with constipation in the morning and then diarrhea throughout the day.  This has been going on for approximately a year.   Objective: Vital signs in last 24 hours: Vitals:   05/23/23 0400 05/23/23 0431 05/23/23 0733 05/23/23 1112  BP:   121/61 (!) 101/58  Pulse:   80 (!) 57  Resp: 15  18 17   Temp:  (!) 96.3 F (35.7 C) 97.6 F (36.4 C) 98.8 F (37.1 C)  TempSrc:      SpO2:   91% 94%  Weight:      Height:       Weight change:   Intake/Output Summary (Last 24 hours) at 05/23/2023 1306 Last data filed at 05/23/2023 2440 Gross per 24 hour  Intake 1295.88 ml  Output 300 ml  Net 995.88 ml     Exam: General: Patient lying in bed in no apparent distress.  Alert and oriented x 3   Lab Results: @LABTEST2 @ Micro Results: Recent Results (from the past 240 hours)  Resp panel by RT-PCR (RSV, Flu A&B, Covid) Anterior Nasal Swab     Status: None   Collection Time: 05/21/23  9:34 AM   Specimen: Anterior Nasal Swab  Result Value Ref Range Status   SARS Coronavirus 2 by RT PCR NEGATIVE NEGATIVE Final    Comment: (NOTE) SARS-CoV-2 target nucleic acids are NOT DETECTED.  The SARS-CoV-2 RNA is generally detectable in upper respiratory specimens during the acute phase of infection. The lowest concentration of SARS-CoV-2 viral copies this  assay can detect is 138 copies/mL. A negative result does not preclude SARS-Cov-2 infection and should not be used as the sole basis for treatment or other patient management decisions. A negative result may occur with  improper specimen collection/handling, submission of specimen other than nasopharyngeal swab, presence of viral mutation(s) within the areas targeted by this assay, and inadequate number of viral copies(<138 copies/mL). A negative result must be combined with clinical observations, patient history, and epidemiological information. The expected result is Negative.  Fact Sheet for Patients:  BloggerCourse.com  Fact Sheet for Healthcare Providers:  SeriousBroker.it  This test is no t yet approved or cleared by the [place]  FDA and  has been authorized for detection and/or diagnosis of SARS-CoV-2 by FDA under an Emergency Use Authorization (EUA). This EUA will remain  in effect (meaning this test can be used) for the duration of the COVID-19 declaration under Section 564(b)(1) of the Act, 21 U.S.C.section 360bbb-3(b)(1), unless the authorization is terminated  or revoked sooner.       Influenza A by PCR NEGATIVE NEGATIVE Final   Influenza B by PCR NEGATIVE NEGATIVE Final    Comment: (NOTE) The Xpert Xpress SARS-CoV-2/FLU/RSV plus assay is intended as an aid in the diagnosis of influenza from Nasopharyngeal swab specimens and should not be used  as a sole basis for treatment. Nasal washings and aspirates are unacceptable for Xpert Xpress SARS-CoV-2/FLU/RSV testing.  Fact Sheet for Patients: BloggerCourse.com  Fact Sheet for Healthcare Providers: SeriousBroker.it  This test is not yet approved or cleared by the [place]  FDA and has been authorized for detection and/or diagnosis of SARS-CoV-2 by FDA under an Emergency Use Authorization (EUA). This EUA will  remain in effect (meaning this test can be used) for the duration of the COVID-19 declaration under Section 564(b)(1) of the Act, 21 U.S.C. section 360bbb-3(b)(1), unless the authorization is terminated or revoked.     Resp Syncytial Virus by PCR NEGATIVE NEGATIVE Final    Comment: (NOTE) Fact Sheet for Patients: BloggerCourse.com  Fact Sheet for Healthcare Providers: SeriousBroker.it  This test is not yet approved or cleared by the [place]  FDA and has been authorized for detection and/or diagnosis of SARS-CoV-2 by FDA under an Emergency Use Authorization (EUA). This EUA will remain in effect (meaning this test can be used) for the duration of the COVID-19 declaration under Section 564(b)(1) of the Act, 21 U.S.C. section 360bbb-3(b)(1), unless the authorization is terminated or revoked.  Performed at [facility], [address] [place], [place] [address]   Expectorated Sputum Assessment w Gram Stain, Rflx to Resp Cult     Status: None   Collection Time: 05/22/23  5:35 PM   Specimen: Sputum  Result Value Ref Range Status   Specimen Description SPUTUM  Final   Special Requests NONE  Final   Sputum evaluation   Final    THIS SPECIMEN IS ACCEPTABLE FOR SPUTUM CULTURE Performed at [facility], [address], [place], [place] [address]    Report Status 05/23/2023 FINAL  Final  Culture, Respiratory w Gram Stain     Status: None (Preliminary result)   Collection Time: 05/22/23  5:35 PM   Specimen: SPU  Result Value Ref Range Status   Specimen Description   Final    SPUTUM Performed at [facility], [address] [place], [place] [address]    Special Requests   Final    NONE Reflexed from [id number] Performed at [facility], [address] [place], [place] [address]    Gram Stain   Final    NO WBC SEEN MODERATE GRAM NEGATIVE RODS RARE GRAM POSITIVE COCCI IN PAIRS RARE GRAM  POSITIVE RODS Performed at [facility], [address] [place], [place] [address]    Culture PENDING  Incomplete   Report Status PENDING  Incomplete   Studies/Results: ECHOCARDIOGRAM COMPLETE Result Date: 05/22/2023    ECHOCARDIOGRAM REPORT   Patient Name:   Gloria Carr Date of Exam: 05/22/2023 Medical Rec #:  [id number]   Height:       59.0 in Accession #:    [id number]  Weight:       110.7 lb Date of Birth:  [birth year]    BSA:          1.434 m Patient Age:    89 years    BP:           117/58 mmHg Patient Gender: F           HR:           81 bpm. Exam Location:  [facility] Procedure: 2D Echo, Color Doppler and Cardiac Doppler (Both Spectral and Color            Flow Doppler were utilized during procedure). Indications:     I48.91*  Unspecified atrial fibrillation  History:         Patient has no prior history of Echocardiogram examinations.                  Arrythmias:Atrial Fibrillation; Risk Factors:Hypertension and                  Dyslipidemia.  Sonographer:     Sherline Distel Senior RDCS Referring Phys:  [id number] Alayne Allis NEWTON Diagnosing Phys: Belva Boyden MD IMPRESSIONS  1. Left ventricular ejection fraction, by estimation, is 60 to 65%. The left ventricle has normal function. The left ventricle has no regional wall motion abnormalities. Left ventricular diastolic parameters are indeterminate.  2. Right ventricular systolic function is normal. The right ventricular size is normal. There is mildly elevated pulmonary artery systolic pressure. The estimated right ventricular systolic pressure is 44.4 mmHg.  3. The mitral valve is normal in structure. Moderate to severe mitral valve regurgitation. No evidence of mitral stenosis.  4. Tricuspid valve regurgitation is moderate.  5. The aortic valve is normal in structure. Aortic valve regurgitation is not visualized. No aortic stenosis is present.  6. The inferior vena cava is normal in size with greater than 50% respiratory variability, suggesting right atrial  pressure of 3 mmHg. FINDINGS  Left Ventricle: Left ventricular ejection fraction, by estimation, is 60 to 65%. The left ventricle has normal function. The left ventricle has no regional wall motion abnormalities. Strain was performed and the global longitudinal strain is indeterminate. The left ventricular internal cavity size was normal in size. There is no left ventricular hypertrophy. Left ventricular diastolic parameters are indeterminate. Right Ventricle: The right ventricular size is normal. No increase in right ventricular wall thickness. Right ventricular systolic function is normal. There is mildly elevated pulmonary artery systolic pressure. The tricuspid regurgitant velocity is 3.14  m/s, and with an assumed right atrial pressure of 5 mmHg, the estimated right ventricular systolic pressure is 44.4 mmHg. Left Atrium: Left atrial size was normal in size. Right Atrium: Right atrial size was normal in size. Pericardium: There is no evidence of pericardial effusion. Mitral Valve: The mitral valve is normal in structure. There is mild calcification of the mitral valve leaflet(s). Moderate to severe mitral valve regurgitation. No evidence of mitral valve stenosis. Tricuspid Valve: The tricuspid valve is normal in structure. Tricuspid valve regurgitation is moderate . No evidence of tricuspid stenosis. Aortic Valve: The aortic valve is normal in structure. Aortic valve regurgitation is not visualized. No aortic stenosis is present. Pulmonic Valve: The pulmonic valve was normal in structure. Pulmonic valve regurgitation is mild. No evidence of pulmonic stenosis. Aorta: The aortic root is normal in size and structure. Venous: The inferior vena cava is normal in size with greater than 50% respiratory variability, suggesting right atrial pressure of 3 mmHg. IAS/Shunts: No atrial level shunt detected by color flow Doppler. Additional Comments: 3D was performed not requiring image post processing on an independent  workstation and was indeterminate.  LEFT VENTRICLE PLAX 2D LVIDd:         4.00 cm   Diastology LVIDs:         2.10 cm   LV e' medial:    7.29 cm/s LV PW:         0.90 cm   LV E/e' medial:  18.9 LV IVS:        1.00 cm   LV e' lateral:   9.46 cm/s LVOT diam:     1.90 cm   LV  E/e' lateral: 14.6 LV SV:         37 LV SV Index:   26 LVOT Area:     2.84 cm  RIGHT VENTRICLE RV S prime:     13.40 cm/s TAPSE (M-mode): 1.6 cm LEFT ATRIUM             Index        RIGHT ATRIUM           Index LA diam:        3.20 cm 2.23 cm/m   RA Area:     22.20 cm LA Vol (A2C):   53.2 ml 37.09 ml/m  RA Volume:   65.80 ml  45.87 ml/m LA Vol (A4C):   41.7 ml 29.07 ml/m LA Biplane Vol: 47.2 ml 32.91 ml/m  AORTIC VALVE LVOT Vmax:   75.00 cm/s LVOT Vmean:  56.500 cm/s LVOT VTI:    0.132 m  AORTA Ao Asc diam: 3.00 cm MITRAL VALVE                TRICUSPID VALVE MV Area (PHT): 2.29 cm     TR Peak grad:   39.4 mmHg MV Decel Time: 331 msec     TR Vmax:        314.00 cm/s MV E velocity: 138.00 cm/s                             SHUNTS                             Systemic VTI:  0.13 m                             Systemic Diam: 1.90 cm Belva Boyden MD Electronically signed by Belva Boyden MD Signature Date/Time: 05/22/2023/12:47:55 PM    Final    Medications: I have reviewed the patient's current medications. Scheduled Meds:  atorvastatin   20 mg Oral Daily   feeding supplement  237 mL Oral BID BM   levothyroxine   50 mcg Oral QAC breakfast   metoprolol tartrate  12.5 mg Oral BID   pantoprazole (PROTONIX) IV  40 mg Intravenous Q12H   Continuous Infusions:  azithromycin 250 mL/hr at 05/22/23 1656   cefTRIAXone (ROCEPHIN)  IV 2 g (05/23/23 1020)   PRN Meds:.acetaminophen, ondansetron  **OR** ondansetron  (ZOFRAN ) IV, mouth rinse   Assessment: Principal Problem:   Community acquired pneumonia of left lower lobe of lung Active Problems:   Pure hypercholesterolemia   Essential hypertension   Atrial fibrillation (HCC)   Pleural  effusion   Shortness of breath   GI bleed    Plan: This patient is getting better from a pulmonary point of view and is still considering whether or not to have a colonoscopy.  The patient's son seems to be more leaning towards having the procedure done.  He just agrees that until her respiratory status is optimized it would not be safe for her.  The patient will be seen tomorrow and decide whether or not she wants to proceed with a colonoscopy.  I have also told the patient and the family that if the patient should have significant GI bleeding then a colonoscopy may be a reasonable path forward.   LOS: 1 day   Gloria Sink, MD.FACG 05/23/2023, 1:06 PM Pager [phone] 7am-5pm  Check AMION for 5pm -7am coverage and  on weekends

## 2023-05-23 NOTE — Progress Notes (Signed)
 Progress Note   Patient: Gloria Carr BJY:[id number] DOB: 05/01/1927 DOA: 05/21/2023     1 DOS: the patient was seen and examined on 05/23/2023     Brief hospital course:  Gloria Carr is a 89 y.o. female with medical history significant of HTN, HLD, macular degeneratio presenting with pneumonia and atrial fibrillation.  Patient reports worsening cough, sputum production as well as shortness of breath over the past 3 to 4 days.  No fevers or chills.  No nausea or vomiting.  Cough and shortness of breath is persistently worsened.  Denies any prior episodes like this in the past.  No palpitations.  No nausea or vomiting.  No abdominal pain or diarrhea.  Noticed chronic urinary issues.  Denies any dysuria.  Has chronic urinary frequency.  No orthopnea or PND.  Patient denies any known prior cardiac history in the past. Presented to the ER afebrile, hemodynamically stable.  Satting well on room air.  White count 7.7, hemoglobin 13.7, platelets 245, creatinine 0.6.  Glucose 118.  BNP 237.  CT chest with left lower lobe pneumonia with?  Parapneumonic effusion.   Assessment and Plan:   Lower GI bleeding Patient has been seen by gastroenterologist Being planned for possible colonoscopy tomorrow We will continue to monitor CBC closely We will transfuse as indicated I have discussed the case with gastroenterologist    PNA (pneumonia) Positive cough, sputum production and shortness of breath with noted left lower lobe pneumonia parapneumonic effusion on CT imaging Continue IV Rocephin azithromycin for infectious coverage Blood and respiratory cultures Minimal hypoxia present Monitor on oxygen   Atrial fibrillation (HCC) Noted atrial fibrillation on multiple EKG Rate controlled at present Will add on 2D echo CHA2DS2-VASc score 5+ Defer anticoagulation for now given age as well as ongoing GI bleed Consult cardiology Monitor   Essential hypertension BP stable  Titrate home regimen      Pure  hypercholesterolemia Cont statin      VTE prophylaxis SCD    Advance Care Planning:   Code Status: Full Code    Consults: Cardiology    Family Communication: No family at the bedside    Subjective:  Seen and examined at bedside this morning Patient denies any abdominal pain or any more bleeding per rectum this morning I have discussed with patient's son who has given go ahead for colonoscopy to be done I have informed gastroenterologist   Physical Exam:    Appearance: She is normal weight.  HENT:     Head: Normocephalic and atraumatic.     Nose: Nose normal.     Mouth/Throat:     Mouth: Mucous membranes are moist.  Eyes:     Pupils: Pupils are equal, round, and reactive to light.  Cardiovascular:     Rate and Rhythm: Normal rate and regular rhythm.  Pulmonary:     Effort: Pulmonary effort is normal.  Abdominal:     General: Bowel sounds are normal.  Musculoskeletal:        General: Normal range of motion.  Skin:    General: Skin is warm.  Neurological:     General: No focal deficit present.  Psychiatric:        Mood and Affect: Mood normal.    Data Reviewed: CT scan of the chest reviewed showing findings of pneumonia   Vitals:   05/23/23 0431 05/23/23 0733 05/23/23 1112 05/23/23 1600  BP:  121/61 (!) 101/58 (!) 97/55  Pulse:  80 (!) 57 (!) 53  Resp:  18  17 19  Temp: (!) 96.3 F (35.7 C) 97.6 F (36.4 C) 98.8 F (37.1 C) 98.3 F (36.8 C)  TempSrc:    Oral  SpO2:  91% 94% 98%  Weight:      Height:          Latest Ref Rng & Units 05/23/2023   10:54 AM 05/22/2023    2:15 PM 05/22/2023   10:21 AM  CBC  WBC 4.0 - 10.5 K/uL 8.4  9.1  8.6   Hemoglobin 12.0 - 15.0 g/dL 8.4  9.5  16.1   Hematocrit 36.0 - 46.0 % 26.4  29.1  32.0   Platelets 150 - 400 K/uL 185  210  213        Latest Ref Rng & Units 05/22/2023    3:35 AM 05/21/2023    9:33 AM 01/10/2023    2:21 PM  BMP  Glucose 70 - 99 mg/dL 096  045  75   BUN 8 - 23 mg/dL 15  13  17    Creatinine 0.44 -  1.00 mg/dL 4.09  8.11  9.14   Sodium 135 - 145 mmol/L 138  137  140   Potassium 3.5 - 5.1 mmol/L 4.1  3.6  4.1   Chloride 98 - 111 mmol/L 108  103  102   CO2 22 - 32 mmol/L 23  26  28    Calcium  8.9 - 10.3 mg/dL 7.6  8.4  9.3      Author: Ezzard Holms, MD 05/23/2023 4:31 PM  For on call review [email or web address].

## 2023-05-24 DIAGNOSIS — J189 Pneumonia, unspecified organism: Secondary | ICD-10-CM | POA: Diagnosis not present

## 2023-05-24 DIAGNOSIS — I4819 Other persistent atrial fibrillation: Secondary | ICD-10-CM

## 2023-05-24 DIAGNOSIS — K625 Hemorrhage of anus and rectum: Secondary | ICD-10-CM

## 2023-05-24 LAB — CBC WITH DIFFERENTIAL/PLATELET
Abs Immature Granulocytes: 0.04 10*3/uL (ref 0.00–0.07)
Basophils Absolute: 0 10*3/uL (ref 0.0–0.1)
Basophils Relative: 1 %
Eosinophils Absolute: 0.1 10*3/uL (ref 0.0–0.5)
Eosinophils Relative: 2 %
HCT: 26.5 % — ABNORMAL LOW (ref 36.0–46.0)
Hemoglobin: 8.5 g/dL — ABNORMAL LOW (ref 12.0–15.0)
Immature Granulocytes: 1 %
Lymphocytes Relative: 35 %
Lymphs Abs: 2.9 10*3/uL (ref 0.7–4.0)
MCH: 31 pg (ref 26.0–34.0)
MCHC: 32.1 g/dL (ref 30.0–36.0)
MCV: 96.7 fL (ref 80.0–100.0)
Monocytes Absolute: 0.9 10*3/uL (ref 0.1–1.0)
Monocytes Relative: 10 %
Neutro Abs: 4.3 10*3/uL (ref 1.7–7.7)
Neutrophils Relative %: 51 %
Platelets: 173 10*3/uL (ref 150–400)
RBC: 2.74 MIL/uL — ABNORMAL LOW (ref 3.87–5.11)
RDW: 13 % (ref 11.5–15.5)
WBC: 8.2 10*3/uL (ref 4.0–10.5)
nRBC: 0 % (ref 0.0–0.2)

## 2023-05-24 LAB — BASIC METABOLIC PANEL WITH GFR
Anion gap: 6 (ref 5–15)
BUN: 22 mg/dL (ref 8–23)
CO2: 26 mmol/L (ref 22–32)
Calcium: 7.9 mg/dL — ABNORMAL LOW (ref 8.9–10.3)
Chloride: 108 mmol/L (ref 98–111)
Creatinine, Ser: 0.65 mg/dL (ref 0.44–1.00)
GFR, Estimated: >60 mL/min (ref >60)
Glucose, Bld: 87 mg/dL (ref 70–99)
Potassium: 4.2 mmol/L (ref 3.5–5.1)
Sodium: 140 mmol/L (ref 135–145)

## 2023-05-24 LAB — STREP PNEUMONIAE URINARY ANTIGEN: Strep Pneumo Urinary Antigen: NEGATIVE

## 2023-05-24 MED ORDER — PEG 3350-KCL-NA BICARB-NACL 420 G PO SOLR
4000.0000 mL | Freq: Once | ORAL | Status: AC
  Administered 2023-05-24: 4000 mL via ORAL
  Filled 2023-05-24: qty 4000

## 2023-05-24 NOTE — Plan of Care (Signed)
  Problem: Education: Goal: Knowledge of General Education information will improve Description: Including pain rating scale, medication(s)/side effects and non-pharmacologic comfort measures Outcome: Progressing   Problem: Health Behavior/Discharge Planning: Goal: Ability to manage health-related needs will improve Outcome: Progressing   Problem: Clinical Measurements: Goal: Ability to maintain clinical measurements within normal limits will improve Outcome: Progressing Goal: Will remain free from infection Outcome: Progressing Goal: Diagnostic test results will improve Outcome: Progressing Goal: Respiratory complications will improve Outcome: Progressing Goal: Cardiovascular complication will be avoided Outcome: Progressing   Problem: Activity: Goal: Risk for activity intolerance will decrease Outcome: Progressing   Problem: Nutrition: Goal: Adequate nutrition will be maintained Outcome: Progressing   Problem: Coping: Goal: Level of anxiety will decrease Outcome: Progressing   Problem: Elimination: Goal: Will not experience complications related to bowel motility Outcome: Progressing Goal: Will not experience complications related to urinary retention Outcome: Progressing   Problem: Pain Managment: Goal: General experience of comfort will improve and/or be controlled Outcome: Progressing   Problem: Safety: Goal: Ability to remain free from injury will improve Outcome: Progressing   Problem: Skin Integrity: Goal: Risk for impaired skin integrity will decrease Outcome: Progressing   Problem: Education: Goal: Knowledge of disease or condition will improve Outcome: Progressing Goal: Understanding of medication regimen will improve Outcome: Progressing Goal: Individualized Educational Video(s) Outcome: Progressing   Problem: Activity: Goal: Ability to tolerate increased activity will improve Outcome: Progressing   Problem: Cardiac: Goal: Ability to achieve  and maintain adequate cardiopulmonary perfusion will improve Outcome: Progressing   Problem: Health Behavior/Discharge Planning: Goal: Ability to safely manage health-related needs after discharge will improve Outcome: Progressing   Problem: Activity: Goal: Ability to tolerate increased activity will improve Outcome: Progressing   Problem: Clinical Measurements: Goal: Ability to maintain a body temperature in the normal range will improve Outcome: Progressing   Problem: Respiratory: Goal: Ability to maintain adequate ventilation will improve Outcome: Progressing Goal: Ability to maintain a clear airway will improve Outcome: Progressing

## 2023-05-24 NOTE — Progress Notes (Signed)
 Occupational Therapy Treatment Patient Details Name: Gloria Carr MRN: [id number] DOB: 15-Mar-1927 Today's Date: 05/24/2023   History of present illness Gloria Carr is a 89 y.o. female with medical history significant of HTN, HLD, macular degeneratio presenting with pneumonia and atrial fibrillation.  Patient reports worsening cough, sputum production as well as shortness of breath over the past 3 to 4 days.   OT comments  Pt seen for OT treatment on this date. Upon arrival to room pt lying in bed, agreeable to tx. Bed mobility; supine <>sit MINA for LE supports. Completed LB/UB dressing with CGA when in standing with RW for external support as needed. Pt participated in sink level grooming tasks with CGA. Amb into hallway ~38ft with RW, pt desired to returned to room due to fatigue and LE weakness. Pt reports she is eager to return to PLOF/indep living facility, as she was very indep and active PTA. Pt making good progress toward goals, will continue to follow POC. Discharge recommendation have been updated to reflect pt's current needs.         If plan is discharge home, recommend the following:  A little help with bathing/dressing/bathroom;A little help with walking and/or transfers;Assistance with cooking/housework;Direct supervision/assist for financial management;Direct supervision/assist for medications management;Assist for transportation;Help with stairs or ramp for entrance   Equipment Recommendations  Other (comment)    Recommendations for Other Services      Precautions / Restrictions Precautions Precautions: Fall Recall of Precautions/Restrictions: Intact Restrictions Weight Bearing Restrictions Per Provider Order: No       Mobility Bed Mobility Overal bed mobility: Needs Assistance Bed Mobility: Supine to Sit, Sit to Supine     Supine to sit: Min assist Sit to supine: Contact guard assist   General bed mobility comments: Verbal cues for hand placement getting to the  EOB    Transfers Overall transfer level: Needs assistance Equipment used: Rolling walker (2 wheels) Transfers: Sit to/from Stand Sit to Stand: Contact guard assist           General transfer comment: Amb into hallway with CGA + RW (total ~38ft)     Balance Overall balance assessment: Needs assistance Sitting-balance support: Feet supported, No upper extremity supported Sitting balance-Leahy Scale: Good     Standing balance support: During functional activity, Reliant on assistive device for balance, Bilateral upper extremity supported Standing balance-Leahy Scale: Fair Standing balance comment: Good static standing, single UE support for safety                           ADL either performed or assessed with clinical judgement   ADL Overall ADL's : Needs assistance/impaired     Grooming: Wash/dry face;Oral care;Sitting;Contact guard assist Grooming Details (indicate cue type and reason): Sink level         Upper Body Dressing : Contact guard assist;Sitting Upper Body Dressing Details (indicate cue type and reason): Don gown Lower Body Dressing: Contact guard assist;Sit to/from stand Lower Body Dressing Details (indicate cue type and reason): Don Office manager: Ambulation;Rolling walker (2 wheels) Statistician Details (indicate cue type and reason): Simulated         Functional mobility during ADLs: Contact guard assist;Rolling walker (2 wheels) General ADL Comments: Dressing tasks sit/stand at EOB; CGA. Sink level grooming; CGA    Communication Communication Communication: No apparent difficulties   Cognition Arousal: Alert Behavior During Therapy: WFL for tasks assessed/performed Cognition: No family/caregiver present to determine baseline  OT - Cognition Comments: A/O x4 (RN reports pt gets very disoriented at night, but during the day is A/Ox4)                 Following commands: Intact        Cueing    Cueing Techniques: Verbal cues  Exercises Exercises: Other exercises Other Exercises Other Exercises: Edu: Safe ADL completion during sink level grooming tasks, hand placement during bed mobility    Shoulder Instructions       General Comments Happy to get out of bed, eager to get better    Pertinent Vitals/ Pain       Pain Assessment Pain Assessment: No/denies pain  Home Living                                          Prior Functioning/Environment              Frequency  Min 3X/week        Progress Toward Goals  OT Goals(current goals can now be found in the care plan section)  Progress towards OT goals: Progressing toward goals  Acute Rehab OT Goals Patient Stated Goal: Feel better OT Goal Formulation: With patient Time For Goal Achievement: 06/05/23 Potential to Achieve Goals: Good  Plan      Co-evaluation                 AM-PAC OT "6 Clicks" Daily Activity     Outcome Measure   Help from another person eating meals?: None Help from another person taking care of personal grooming?: A Little Help from another person toileting, which includes using toliet, bedpan, or urinal?: A Little Help from another person bathing (including washing, rinsing, drying)?: A Little Help from another person to put on and taking off regular upper body clothing?: None Help from another person to put on and taking off regular lower body clothing?: None 6 Click Score: 21    End of Session Equipment Utilized During Treatment: Gait belt;Rolling walker (2 wheels)  OT Visit Diagnosis: Muscle weakness (generalized) (M62.81);Other abnormalities of gait and mobility (R26.89)   Activity Tolerance Patient tolerated treatment well   Patient Left in bed;with call bell/phone within reach;with bed alarm set   Nurse Communication Mobility status        Time: 8295-6213 OT Time Calculation (min): 24 min  Charges: OT General Charges $OT Visit: 1 Visit OT  Treatments $Self Care/Home Management : 8-22 mins $Therapeutic Activity: 8-22 mins  Rosaria Common M.S. OTR/L  05/24/23, 4:01 PM

## 2023-05-24 NOTE — Progress Notes (Signed)
 Rounding Note    Patient Name: Gloria Carr Date of Encounter: 05/24/2023  [facility] Cardiologist: Timothy Gollan, MD   Subjective   Patient remains in rate controlled atrial fibrillation. She is asymptomatic from a cardiac perspective. Reported BRBPR earlier in admission with hgb continuing to trend down.   Inpatient Medications    Scheduled Meds:  atorvastatin   20 mg Oral Daily   feeding supplement  237 mL Oral BID BM   levothyroxine   50 mcg Oral QAC breakfast   metoprolol tartrate  12.5 mg Oral BID   pantoprazole (PROTONIX) IV  40 mg Intravenous Q12H   Continuous Infusions:  azithromycin 500 mg (05/23/23 1734)   cefTRIAXone (ROCEPHIN)  IV 2 g (05/23/23 1020)   PRN Meds: acetaminophen, ondansetron  **OR** ondansetron  (ZOFRAN ) IV, mouth rinse   Vital Signs    Vitals:   05/23/23 2000 05/24/23 0000 05/24/23 0400 05/24/23 0723  BP: 114/61 127/60 120/61 (!) 140/76  Pulse: 61 62 66 75  Resp: 12   17  Temp: 98.6 F (37 C) 98.2 F (36.8 C) 98.4 F (36.9 C) 97.6 F (36.4 C)  TempSrc: Oral Oral Oral   SpO2: 96% 96% 96% 95%  Weight:      Height:        Intake/Output Summary (Last 24 hours) at 05/24/2023 0814 Last data filed at 05/24/2023 0400 Gross per 24 hour  Intake 1920.79 ml  Output --  Net 1920.79 ml      05/21/2023    9:26 AM 01/10/2023    1:28 PM 01/03/2023   11:20 AM  Last 3 Weights  Weight (lbs) 110 lb 11.2 oz 104 lb 103 lb 4 oz  Weight (kg) 50.213 kg 47.174 kg 46.834 kg      Telemetry    Atrial fibrillation with rate 60-80 bpm - Personally Reviewed  Physical Exam   GEN: No acute distress.   Neck: No JVD Cardiac: IRIR, no murmurs, rubs, or gallops.  Respiratory: Faint crackles, improved from yesterday GI: Soft, nontender, non-distended  MS: No edema; No deformity. Neuro:  Nonfocal  Psych: Normal affect   Labs    High Sensitivity Troponin:   Recent Labs  Lab 05/21/23 0933 05/21/23 1120  TROPONINIHS 9 9      Chemistry Recent Labs  Lab 05/21/23 0933 05/22/23 0335 05/24/23 0455  NA 137 138 140  K 3.6 4.1 4.2  CL 103 108 108  CO2 26 23 26   GLUCOSE 118* 174* 87  BUN 13 15 22   CREATININE 0.60 0.55 0.65  CALCIUM  8.4* 7.6* 7.9*  PROT  --  5.4*  --   ALBUMIN  --  2.8*  --   AST  --  24  --   ALT  --  16  --   ALKPHOS  --  46  --   BILITOT  --  1.0  --   GFRNONAA >60 >60 >60  ANIONGAP 8 7 6     Lipids No results for input(s): "CHOL", "TRIG", "HDL", "LABVLDL", "LDLCALC", "CHOLHDL" in the last 168 hours.  Hematology Recent Labs  Lab 05/22/23 1415 05/23/23 1054 05/24/23 0455  WBC 9.1 8.4 8.2  RBC 3.07* 2.72* 2.74*  HGB 9.5* 8.4* 8.5*  HCT 29.1* 26.4* 26.5*  MCV 94.8 97.1 96.7  MCH 30.9 30.9 31.0  MCHC 32.6 31.8 32.1  RDW 12.7 12.9 13.0  PLT 210 185 173   Thyroid   Recent Labs  Lab 05/23/23 0418  TSH 3.209    BNP Recent Labs  Lab  05/21/23 0933  BNP 237.3*    DDimer No results for input(s): "DDIMER" in the last 168 hours.   Radiology    CT CHEST WO CONTRAST Result Date: 05/21/2023 IMPRESSION: 1. Left lower lobe pneumonia with suspected parapneumonic effusion. At this is radiographically occult, consideration should be given toward follow-up CT scan in 1-2 months to ensure clearance and absence of an underlying process. Electronically Signed   By: Reagan Camera M.D.   On: 05/21/2023 14:24   DG Chest 2 View Result Date: 05/21/2023 IMPRESSION: Left pleural effusion obscuring the left base, possible underlying pneumonia given the history. Consider CT given the degree of fluid. Electronically Signed   By: Ronnette Coke M.D.   On: 05/21/2023 10:40   Cardiac Studies   05/22/2022 Echo complete 1. Left ventricular ejection fraction, by estimation, is 60 to 65%. The  left ventricle has normal function. The left ventricle has no regional  wall motion abnormalities. Left ventricular diastolic parameters are  indeterminate.   2. Right ventricular systolic function is normal. The  right ventricular  size is normal. There is mildly elevated pulmonary artery systolic  pressure. The estimated right ventricular systolic pressure is 44.4 mmHg.   3. The mitral valve is normal in structure. Moderate to severe mitral  valve regurgitation. No evidence of mitral stenosis.   4. Tricuspid valve regurgitation is moderate.   5. The aortic valve is normal in structure. Aortic valve regurgitation is  not visualized. No aortic stenosis is present.   6. The inferior vena cava is normal in size with greater than 50%  respiratory variability, suggesting right atrial pressure of 3 mmHg.   Patient Profile     89 y.o. female with history of hypertension, hyperlipidemia, macular degeneration, hypothyroidism, peripheral neuropathy, cervical DJD, spondylosis gnosis, glaucoma, chronic low back pain, occipital neuralgia, osteoarthritis, overactive bladder, and recurrent UTIs, who presented 5/10 with progressive cough, dyspnea, and palpitations.  Found to have left lower lobe pneumonia and atrial fibrillation with RVR.  Also noted to have bright red blood per rectum per RN, hemoglobin trending down.  Assessment & Plan    Pneumonia - 3-5 day history of productive cough and dyspnea - CT chest showed LLL pneumonia - Likely contributing to afib - Antibiotics per IM  Persistent atrial fibrillation - On admission, found to be in rate controlled atrial fibrillation in the setting of pneumonia, unclear chronicity - Patient is asymptomatic from a cardiac perspective - Remains in atrial fibrillation with rate 60-80 bpm - Echo showed EF 60-65% with mildly elevated pulmonary artery systolic pressure, moderate to severe MR - Continued on metoprolol tartrate 12.5 mg twice daily for rate control - CHA2DS2VASc 4 - No DOAC at this time due to BRBPR and worsening anemia - Will need to reevaluate as OP for DOAC or Watchman device  BRBPR Anemia - Episodes of BRBPR since admission - Denies prior history -  Hgb continues to trend down 9.5>8.4 - No anticoagulation as above - PPI per IM - GI evaluation today for potential colonoscopy, patient is still deciding if she would like to proceed  Primary hypertension - BP stable, will continue to follow  Hyperlipidemia - Continue PTA atorvastatin   Hypothyroidism - TSH upper limits of normal - Continue PTA levothyroxine   Moderate to severe MR - Noted on echo this admission - Patient is asymptomatic - Continue to follow as OP  For questions or updates, please contact [facility] Please consult [email or web address] for contact info under  Signed, Brodie Cannon, PA-C  05/24/2023, 8:14 AM

## 2023-05-24 NOTE — Progress Notes (Signed)
 Patient is again naked, all leads off, attempting to get out of bed.  She wants to go to her other room. Reorientation attempts somewhat successful.

## 2023-05-24 NOTE — Progress Notes (Signed)
 Progress Note   Patient: Gloria Carr WUJ:[id number] DOB: 09-15-1927 DOA: 05/21/2023     2 DOS: the patient was seen and examined on 05/24/2023    Brief hospital course: From HPI "[name] is a 89 y.o. female with medical history significant of HTN, HLD, macular degeneratio presenting with pneumonia and atrial fibrillation.  Patient reports worsening cough, sputum production as well as shortness of breath over the past 3 to 4 days.  No fevers or chills.  No nausea or vomiting.  Cough and shortness of breath is persistently worsened.  Denies any prior episodes like this in the past.  No palpitations.  No nausea or vomiting.  No abdominal pain or diarrhea.  Noticed chronic urinary issues.  Denies any dysuria.  Has chronic urinary frequency.  No orthopnea or PND.  Patient denies any known prior cardiac history in the past. Presented to the ER afebrile, hemodynamically stable.  Satting well on room air.  White count 7.7, hemoglobin 13.7, platelets 245, creatinine 0.6.  Glucose 118.  BNP 237.  CT chest with left lower lobe pneumonia with?  Parapneumonic effusion.  "   Assessment and Plan:   Lower GI bleeding Patient has been seen by gastroenterologist We will continue to monitor CBC closely We will transfuse as indicated I have discussed the case with gastroenterologist Patient has agreed to undergo colonoscopy and currently having bowel prep    PNA (pneumonia) Positive cough, sputum production and shortness of breath with noted left lower lobe pneumonia parapneumonic effusion on CT imaging Continue IV Rocephin azithromycin for infectious coverage Blood and respiratory cultures Currently saturating well on room air   Atrial fibrillation (HCC) Noted atrial fibrillation on multiple EKG Rate controlled at present Echo showing EF 60 to 65% with indeterminate diastolic parameters CHA2DS2-VASc score 5+ Defer anticoagulation for now given age as well as ongoing GI bleed Cardiologist on board and  case discussed Monitor   Essential hypertension BP stable  Titrate home regimen      Pure hypercholesterolemia Continue statin therapy     VTE prophylaxis SCD    Advance Care Planning:   Code Status: Full Code    Consults: Cardiology    Family Communication: I discussed with patient's son over the phone today   Subjective:  Seen and examined at bedside this morning Patient denies any bleeding per rectum, nausea vomiting or abdominal pain    Physical Exam:    Appearance: She is normal weight.  HENT:     Head: Normocephalic and atraumatic.     Nose: Nose normal.     Mouth/Throat:     Mouth: Mucous membranes are moist.  Eyes:     Pupils: Pupils are equal, round, and reactive to light.  Cardiovascular:     Rate and Rhythm: Normal rate and regular rhythm.  Pulmonary:     Effort: Pulmonary effort is normal.  Abdominal:     General: Bowel sounds are normal.  Musculoskeletal:        General: Normal range of motion.  Skin:    General: Skin is warm.  Neurological:     General: No focal deficit present.  Psychiatric:        Mood and Affect: Mood normal.    Data Reviewed: CT scan of the chest reviewed showing findings of pneumonia   Vitals:   05/24/23 0000 05/24/23 0400 05/24/23 0723 05/24/23 1119  BP: 127/60 120/61 (!) 140/76 (!) 145/75  Pulse: 62 66 75 63  Resp:   17 18  Temp:  98.2 F (36.8 C) 98.4 F (36.9 C) 97.6 F (36.4 C) 97.9 F (36.6 C)  TempSrc: Oral Oral    SpO2: 96% 96% 95% 93%  Weight:      Height:          Latest Ref Rng & Units 05/24/2023    4:55 AM 05/23/2023   10:54 AM 05/22/2023    2:15 PM  CBC  WBC 4.0 - 10.5 K/uL 8.2  8.4  9.1   Hemoglobin 12.0 - 15.0 g/dL 8.5  8.4  9.5   Hematocrit 36.0 - 46.0 % 26.5  26.4  29.1   Platelets 150 - 400 K/uL 173  185  210        Latest Ref Rng & Units 05/24/2023    4:55 AM 05/22/2023    3:35 AM 05/21/2023    9:33 AM  BMP  Glucose 70 - 99 mg/dL 87  161  096   BUN 8 - 23 mg/dL 22  15  13     Creatinine 0.44 - 1.00 mg/dL 0.45  4.09  8.11   Sodium 135 - 145 mmol/L 140  138  137   Potassium 3.5 - 5.1 mmol/L 4.2  4.1  3.6   Chloride 98 - 111 mmol/L 108  108  103   CO2 22 - 32 mmol/L 26  23  26    Calcium  8.9 - 10.3 mg/dL 7.9  7.6  8.4     Disposition: Hopefully home after medical stabilization and GI clearance  Author: Ezzard Holms, MD 05/24/2023 1:30 PM  For on call review [email or web address].

## 2023-05-24 NOTE — Progress Notes (Signed)
 The patient has removed her telemetry leads, PIV, gown and is attempting to get out of bed. She does not know where she is. Repeatedly asking what should she do now.  Attempts to reorient her are not successful.

## 2023-05-25 ENCOUNTER — Encounter
Admission: EM | Disposition: A | Discharge status: Home Health | Payer: Self-pay | Source: Home / Self Care | Attending: Internal Medicine

## 2023-05-25 ENCOUNTER — Inpatient Hospital Stay: Admitting: Anesthesiology

## 2023-05-25 DIAGNOSIS — K640 First degree hemorrhoids: Secondary | ICD-10-CM

## 2023-05-25 DIAGNOSIS — K5730 Diverticulosis of large intestine without perforation or abscess without bleeding: Secondary | ICD-10-CM

## 2023-05-25 DIAGNOSIS — J189 Pneumonia, unspecified organism: Secondary | ICD-10-CM | POA: Diagnosis not present

## 2023-05-25 HISTORY — PX: COLONOSCOPY: SHX5424

## 2023-05-25 LAB — BASIC METABOLIC PANEL WITH GFR
Anion gap: 4 — ABNORMAL LOW (ref 5–15)
BUN: 12 mg/dL (ref 8–23)
CO2: 26 mmol/L (ref 22–32)
Calcium: 7.5 mg/dL — ABNORMAL LOW (ref 8.9–10.3)
Chloride: 108 mmol/L (ref 98–111)
Creatinine, Ser: 0.58 mg/dL (ref 0.44–1.00)
GFR, Estimated: >60 mL/min (ref >60)
Glucose, Bld: 93 mg/dL (ref 70–99)
Potassium: 4.2 mmol/L (ref 3.5–5.1)
Sodium: 138 mmol/L (ref 135–145)

## 2023-05-25 LAB — CBC WITH DIFFERENTIAL/PLATELET
Abs Immature Granulocytes: 0.03 10*3/uL (ref 0.00–0.07)
Basophils Absolute: 0 10*3/uL (ref 0.0–0.1)
Basophils Relative: 1 %
Eosinophils Absolute: 0.1 10*3/uL (ref 0.0–0.5)
Eosinophils Relative: 2 %
HCT: 27.6 % — ABNORMAL LOW (ref 36.0–46.0)
Hemoglobin: 9 g/dL — ABNORMAL LOW (ref 12.0–15.0)
Immature Granulocytes: 0 %
Lymphocytes Relative: 32 %
Lymphs Abs: 2.4 10*3/uL (ref 0.7–4.0)
MCH: 31.3 pg (ref 26.0–34.0)
MCHC: 32.6 g/dL (ref 30.0–36.0)
MCV: 95.8 fL (ref 80.0–100.0)
Monocytes Absolute: 0.7 10*3/uL (ref 0.1–1.0)
Monocytes Relative: 9 %
Neutro Abs: 4.2 10*3/uL (ref 1.7–7.7)
Neutrophils Relative %: 56 %
Platelets: 200 10*3/uL (ref 150–400)
RBC: 2.88 MIL/uL — ABNORMAL LOW (ref 3.87–5.11)
RDW: 12.8 % (ref 11.5–15.5)
WBC: 7.5 10*3/uL (ref 4.0–10.5)
nRBC: 0 % (ref 0.0–0.2)

## 2023-05-25 LAB — CULTURE, RESPIRATORY W GRAM STAIN: Gram Stain: NONE SEEN

## 2023-05-25 SURGERY — COLONOSCOPY
Anesthesia: General

## 2023-05-25 MED ORDER — PROPOFOL 10 MG/ML IV BOLUS
INTRAVENOUS | Status: AC
  Filled 2023-05-25: qty 40

## 2023-05-25 MED ORDER — PROPOFOL 10 MG/ML IV BOLUS
INTRAVENOUS | Status: DC | PRN
  Administered 2023-05-25: 20 mg via INTRAVENOUS
  Administered 2023-05-25: 40 mg via INTRAVENOUS
  Administered 2023-05-25 (×2): 10 mg via INTRAVENOUS

## 2023-05-25 MED ORDER — PANTOPRAZOLE SODIUM 40 MG PO TBEC: 40.0000 mg | DELAYED_RELEASE_TABLET | Freq: Every day | ORAL | 0 refills | Status: DC

## 2023-05-25 MED ORDER — METOPROLOL TARTRATE 25 MG PO TABS: 12.5000 mg | ORAL_TABLET | Freq: Two times a day (BID) | ORAL | 0 refills | Status: DC

## 2023-05-25 MED ORDER — CEFDINIR 300 MG PO CAPS: 300.0000 mg | ORAL_CAPSULE | Freq: Two times a day (BID) | ORAL | 0 refills | Status: AC

## 2023-05-25 MED ORDER — LIDOCAINE HCL (CARDIAC) PF 100 MG/5ML IV SOSY
PREFILLED_SYRINGE | INTRAVENOUS | Status: DC | PRN
  Administered 2023-05-25: 60 mg via INTRAVENOUS

## 2023-05-25 MED ORDER — AZITHROMYCIN 250 MG PO TABS: 500.0000 mg | ORAL_TABLET | Freq: Every day | ORAL | Status: DC

## 2023-05-25 MED ORDER — SODIUM CHLORIDE 0.9 % IV SOLN: INTRAVENOUS | Status: DC

## 2023-05-25 MED ORDER — DEXMEDETOMIDINE HCL IN NACL 80 MCG/20ML IV SOLN
INTRAVENOUS | Status: AC
Start: 2023-05-25 — End: ?
  Filled 2023-05-25: qty 20

## 2023-05-25 NOTE — Plan of Care (Signed)
  Problem: Activity: Goal: Risk for activity intolerance will decrease Outcome: Progressing   Problem: Nutrition: Goal: Adequate nutrition will be maintained Outcome: Progressing   Problem: Coping: Goal: Level of anxiety will decrease Outcome: Progressing   Problem: Elimination: Goal: Will not experience complications related to bowel motility Outcome: Progressing Goal: Will not experience complications related to urinary retention Outcome: Progressing   Problem: Pain Managment: Goal: General experience of comfort will improve and/or be controlled Outcome: Progressing   Problem: Safety: Goal: Ability to remain free from injury will improve Outcome: Progressing   Problem: Skin Integrity: Goal: Risk for impaired skin integrity will decrease Outcome: Progressing   Problem: Education: Goal: Knowledge of disease or condition will improve Outcome: Progressing Goal: Understanding of medication regimen will improve Outcome: Progressing

## 2023-05-25 NOTE — Progress Notes (Signed)
 PHARMACIST - PHYSICIAN COMMUNICATION DR:   Broadus Canes CONCERNING: Antibiotic IV to Oral Route Change Policy  RECOMMENDATION: This patient is receiving azithromycin by the intravenous route.  Based on criteria approved by the Pharmacy and Therapeutics Committee, the antibiotic(s) is/are being converted to the equivalent oral dose form(s).   DESCRIPTION: These criteria include: Patient being treated for a respiratory tract infection, urinary tract infection, cellulitis or clostridium difficile associated diarrhea if on metronidazole The patient is not neutropenic and does not exhibit a GI malabsorption state The patient is eating (either orally or via tube) and/or has been taking other orally administered medications for a least 24 hours The patient is improving clinically and has a Tmax < 100.5  Gloria Carr PharmD, BCPS 05/25/2023 3:12 PM

## 2023-05-25 NOTE — TOC Progression Note (Signed)
 Transition of Care [facility]) - Progression Note    Patient Details  Name: Gloria Carr MRN: [id number] Date of Birth: 05-08-1927  [facility] CM/SW Contact  Mahsa Hanser A Maeleigh Buschman, RN Phone Number: 05/25/2023, 11:12 AM  Clinical Narrative:    Chart reviewed.  Noted that patient is from [facility] facility.    I have spoken with Burman Carrie, Admission Coordinator with [address] of [place].  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   Jullie Oiler informs me that [facility] does have an agency that they use for in home PT.  Jullie Oiler reports that  [employer] for their in home PT agency.  Jullie Oiler also informs me that their residents can also come back to their SNF nursing facility.  Will follow up with patient and her husband to discuss discharge options.    Patient to have a Colonoscopy today.    TOC will continue to follow for discharge planning.        Expected Discharge Plan and Services                                               Social Determinants of Health (SDOH) Interventions SDOH Screenings   Food Insecurity: No Food Insecurity (05/21/2023)  Housing: Unknown (05/21/2023)  Transportation Needs: No Transportation Needs (05/21/2023)  Utilities: Not At Risk (05/21/2023)  Alcohol Screen: Low Risk  (01/03/2023)  Depression (PHQ2-9): Medium Risk (01/10/2023)  Financial Resource Strain: Low Risk  (01/27/2023)   Received from [facility]  Physical Activity: Insufficiently Active (01/03/2023)  Social Connections: Moderately Isolated (05/21/2023)  Stress: No Stress Concern Present (01/03/2023)  Tobacco Use: Medium Risk (05/21/2023)  Health Literacy: Adequate Health Literacy (01/03/2023)    Readmission Risk Interventions     No data to display

## 2023-05-25 NOTE — Transfer of Care (Signed)
 Immediate Anesthesia Transfer of Care Note  Patient: Gloria Carr  Procedure(s) Performed: COLONOSCOPY  Patient Location: PACU and Endoscopy Unit  Anesthesia Type:General  Level of Consciousness: drowsy, patient cooperative, and responds to stimulation  Airway & Oxygen Therapy: Patient Spontanous Breathing  Post-op Assessment: Report given to RN and Post -op Vital signs reviewed and stable  Post vital signs: Reviewed and stable  Last Vitals:  Vitals Value Taken Time  BP 113/58 05/25/23 1119  Temp    Pulse 73 05/25/23 1120  Resp 18 05/25/23 1120  SpO2 97 % 05/25/23 1120  Vitals shown include unfiled device data.  Last Pain:  Vitals:   05/25/23 1035  TempSrc: Temporal  PainSc:       Patients Stated Pain Goal: 0 (05/24/23 0010)  Complications: No notable events documented.

## 2023-05-25 NOTE — Care Management Important Message (Signed)
 Important Message  Patient Details  Name: Gloria Carr MRN: [id number] Date of Birth: 06-07-1927   Important Message Given:  Yes - Medicare IM     Anise Kerns 05/25/2023, 1:55 PM

## 2023-05-25 NOTE — TOC Transition Note (Addendum)
 Transition of Care [facility]) - Progression Note    Patient Details  Name: Gloria Carr MRN: [id number] Date of Birth: 26-Jun-1927  Transition of Care [facility]) CM/SW Contact  Baird Bombard, RN Phone Number: 05/25/2023, 4:29 PM  Clinical Narrative:    Attempt to reach Roderic City, SW at facility . Per Maurie Southern, phone attendant Burdette Carolin is not able to answer.HH order for PT/OT faxed to [facility], in house rehab.  HH order faxed to [phone]  Spoke with patient. She wishes to returbn home with [facility]. Patient's spouse will transport her home.   TOC signing off.          Expected Discharge Plan and Services         Expected Discharge Date: 05/25/23                                     Social Determinants of Health (SDOH) Interventions SDOH Screenings   Food Insecurity: No Food Insecurity (05/21/2023)  Housing: Unknown (05/21/2023)  Transportation Needs: No Transportation Needs (05/21/2023)  Utilities: Not At Risk (05/21/2023)  Alcohol Screen: Low Risk  (01/03/2023)  Depression (PHQ2-9): Medium Risk (01/10/2023)  Financial Resource Strain: Low Risk  (01/27/2023)   Received from [facility]  Physical Activity: Insufficiently Active (01/03/2023)  Social Connections: Moderately Isolated (05/21/2023)  Stress: No Stress Concern Present (01/03/2023)  Tobacco Use: Medium Risk (05/21/2023)  Health Literacy: Adequate Health Literacy (01/03/2023)    Readmission Risk Interventions     No data to display

## 2023-05-25 NOTE — Plan of Care (Signed)
  Problem: Education: Goal: Knowledge of General Education information will improve Description: Including pain rating scale, medication(s)/side effects and non-pharmacologic comfort measures Outcome: Progressing   Problem: Health Behavior/Discharge Planning: Goal: Ability to manage health-related needs will improve Outcome: Progressing   Problem: Clinical Measurements: Goal: Ability to maintain clinical measurements within normal limits will improve Outcome: Progressing Goal: Will remain free from infection Outcome: Progressing Goal: Diagnostic test results will improve Outcome: Progressing Goal: Respiratory complications will improve Outcome: Progressing Goal: Cardiovascular complication will be avoided Outcome: Progressing   Problem: Activity: Goal: Risk for activity intolerance will decrease Outcome: Progressing   Problem: Nutrition: Goal: Adequate nutrition will be maintained Outcome: Progressing   Problem: Coping: Goal: Level of anxiety will decrease Outcome: Progressing   Problem: Elimination: Goal: Will not experience complications related to bowel motility Outcome: Progressing Goal: Will not experience complications related to urinary retention Outcome: Progressing   Problem: Pain Managment: Goal: General experience of comfort will improve and/or be controlled Outcome: Progressing   Problem: Safety: Goal: Ability to remain free from injury will improve Outcome: Progressing   Problem: Skin Integrity: Goal: Risk for impaired skin integrity will decrease Outcome: Progressing   Problem: Education: Goal: Knowledge of disease or condition will improve Outcome: Progressing Goal: Understanding of medication regimen will improve Outcome: Progressing Goal: Individualized Educational Video(s) Outcome: Progressing   Problem: Activity: Goal: Ability to tolerate increased activity will improve Outcome: Progressing   Problem: Cardiac: Goal: Ability to achieve  and maintain adequate cardiopulmonary perfusion will improve Outcome: Progressing   Problem: Health Behavior/Discharge Planning: Goal: Ability to safely manage health-related needs after discharge will improve Outcome: Progressing   Problem: Activity: Goal: Ability to tolerate increased activity will improve Outcome: Progressing   Problem: Clinical Measurements: Goal: Ability to maintain a body temperature in the normal range will improve Outcome: Progressing   Problem: Respiratory: Goal: Ability to maintain adequate ventilation will improve Outcome: Progressing Goal: Ability to maintain a clear airway will improve Outcome: Progressing

## 2023-05-25 NOTE — Progress Notes (Signed)
 The patient underwent a colonoscopy today with diverticulosis seen in the sigmoid colon the rest of the colon was [place] normal.  The patient's likely source of her lower GI bleed was diverticulosis.  There were also hemorrhoids but these did not seem to be the source of bleeding.  If the patient should have any further bleeding in the future then a CT angiography with vascular surgery or interventional radiology embolization of the bleeding site would be recommended.  Nothing further to do from a GI point of view.  I will sign off.  Please call if any further GI concerns or questions.  We would like to thank you for the opportunity to participate in the care of [facility].

## 2023-05-25 NOTE — Progress Notes (Signed)
 Physical Therapy Treatment Patient Details Name: Gloria Carr MRN: [id number] DOB: 07-Aug-1927 Today's Date: 05/25/2023   History of Present Illness Gloria Carr is a 89 y.o. female with medical history significant of HTN, HLD, macular degeneratio presenting with pneumonia and atrial fibrillation.  Patient reports worsening cough, sputum production as well as shortness of breath over the past 3 to 4 days.    PT Comments  Pt is progressing with mobility, performing at an overall CGA to supervision level with bed mobility, gait, transfers, and functional standing balance with RW.  Pt continues to be limited reporting SOB/fatigue during gait around 44ft but able to confidently ambulate back to room with RW. Continued PT will assist pt towards greater dynamic standing balance, LE strengthening, and activity tolerance to increase safety and independence and decrease burden of care with functional mobility.    If plan is discharge home, recommend the following: A lot of help with bathing/dressing/bathroom;Assistance with cooking/housework;Assist for transportation;A little help with walking and/or transfers   Can travel by private vehicle        Equipment Recommendations  Rolling walker (2 wheels)    Recommendations for Other Services       Precautions / Restrictions Precautions Precautions: Fall Recall of Precautions/Restrictions: Intact Restrictions Weight Bearing Restrictions Per Provider Order: No     Mobility  Bed Mobility Overal bed mobility: Needs Assistance Bed Mobility: Supine to Sit, Sit to Supine     Supine to sit: Supervision, Used rails, HOB elevated Sit to supine: Min assist   General bed mobility comments: Min A to manage LEs into bed.    Transfers Overall transfer level: Needs assistance Equipment used: Rolling walker (2 wheels) Transfers: Sit to/from Stand, Bed to chair/wheelchair/BSC Sit to Stand: Contact guard assist   Step pivot transfers: Contact guard  assist       General transfer comment: slight unsteadiness noted laterally and posteriorly.    Ambulation/Gait Ambulation/Gait assistance: Contact guard assist Gait Distance (Feet): 70 Feet Assistive device: Rolling walker (2 wheels) Gait Pattern/deviations: Decreased stride length, WFL(Within Functional Limits), Step-through pattern       General Gait Details: pt reported SOB/fatigue around 48ft but able to confidently ambulate back to room with RW.   Stairs             Wheelchair Mobility     Tilt Bed    Modified Rankin (Stroke Patients Only)       Balance Overall balance assessment: Needs assistance Sitting-balance support: Feet supported, No upper extremity supported Sitting balance-Leahy Scale: Good Sitting balance - Comments: tolerated of sitting with  Sup without LOB   Standing balance support: During functional activity, Reliant on assistive device for balance, Single extremity supported Standing balance-Leahy Scale: Good Standing balance comment: Supervision for dynamic standing balance with intermittent UE support.                            Communication Communication Communication: No apparent difficulties  Cognition Arousal: Alert Behavior During Therapy: WFL for tasks assessed/performed   PT - Cognitive impairments: No apparent impairments                         Following commands: Intact      Cueing Cueing Techniques: Verbal cues  Exercises      General Comments        Pertinent Vitals/Pain Pain Assessment Pain Assessment: No/denies pain Faces Pain Scale: Hurts a little  bit Pain Descriptors / Indicators: Sore (soreness in back of LEs) Pain Intervention(s): Monitored during session    Home Living                          Prior Function            PT Goals (current goals can now be found in the care plan section) Acute Rehab PT Goals Time For Goal Achievement: 06/12/23 Potential to  Achieve Goals: Fair Progress towards PT goals: Progressing toward goals    Frequency    Min 2X/week      PT Plan      Co-evaluation              AM-PAC PT "6 Clicks" Mobility   Outcome Measure  Help needed turning from your back to your side while in a flat bed without using bedrails?: A Little Help needed moving from lying on your back to sitting on the side of a flat bed without using bedrails?: A Little Help needed moving to and from a bed to a chair (including a wheelchair)?: A Little Help needed standing up from a chair using your arms (e.g., wheelchair or bedside chair)?: A Little Help needed to walk in hospital room?: A Little Help needed climbing 3-5 steps with a railing? : A Lot 6 Click Score: 17    End of Session Equipment Utilized During Treatment: Gait belt Activity Tolerance: Patient tolerated treatment well Patient left: in bed;with call bell/phone within reach;with bed alarm set Nurse Communication: Mobility status PT Visit Diagnosis: Muscle weakness (generalized) (M62.81);Other (comment)     Time: 8756-4332 PT Time Calculation (min) (ACUTE ONLY): 25 min  Charges:    $Gait Training: 8-22 mins $Therapeutic Activity: 8-22 mins PT General Charges $$ ACUTE PT VISIT: 1 Visit                     Eliazar Gross, PTA  05/25/23, 9:19 AM

## 2023-05-25 NOTE — Discharge Summary (Signed)
 Physician Discharge Summary  Tenile Ulery HQI:[id number] DOB: [birth year] DOA: 05/21/2023  PCP: Thersia Flax, MD  Admit date: 05/21/2023 Discharge date: 05/25/2023  Admitted From: ILF Disposition: ILF  Recommendations for Outpatient Follow-up:  Follow up with PCP in 1-2 weeks F/u w/ cardio, Dr. Gollan, in 1-2 weeks  Home Health: Equipment/Devices:  Discharge Condition: stable  CODE STATUS: full  Diet recommendation: Heart Healthy  Brief/Interim Summary: HPI was taken from Dr. Daisey Dryer: Calandria Yaklin is a 89 y.o. female with medical history significant of HTN, HLD, macular degeneratio presenting with pneumonia and atrial fibrillation.  Patient reports worsening cough, sputum production as well as shortness of breath over the past 3 to 4 days.  No fevers or chills.  No nausea or vomiting.  Cough and shortness of breath is persistently worsened.  Denies any prior episodes like this in the past.  No palpitations.  No nausea or vomiting.  No abdominal pain or diarrhea.  Noticed chronic urinary issues.  Denies any dysuria.  Has chronic urinary frequency.  No orthopnea or PND.  Patient denies any known prior cardiac history in the past. Presented to the ER afebrile, hemodynamically stable.  Satting well on room air.  White count 7.7, hemoglobin 13.7, platelets 245, creatinine 0.6.  Glucose 118.  BNP 237.  CT chest with left lower lobe pneumonia with?  Parapneumonic effusion.  Discharge Diagnoses:  Principal Problem:   Community acquired pneumonia of left lower lobe of lung Active Problems:   Atrial fibrillation (HCC)   Pure hypercholesterolemia   Essential hypertension   Pleural effusion   Shortness of breath   GI bleed   Rectal bleeding  GI bleeding: etiology unclear. S/p colonoscopy which showed diverticulosis in the sigmoid colon, non- bleeding internal hemorrhoids as per GI. H&H are trending up today. Return to ER if bleeding re-occurs  Pneumonia: continue on IV rocephin, azithromycin,  bronchodilators & encourage incentive spirometry   Likely PAF: defer anticoagulation indefinitely secondary to age & not a candidate for watchman's procedure as per cardio. Continue on metoprolol    HTN: continue on metoprolol    HLD: continue on statin   Generalized weakness: PT recs HH & OT recs SNF. Pt refuses SNF but agrees to continue PT that she was already doing at her independent living facility   Discharge Instructions  Discharge Instructions     Amb referral to [facility]   Complete by: As directed    Diet general   Complete by: As directed    Discharge instructions   Complete by: As directed    F/u w/ PCP in 1-2 weeks. F/u w/ cardio, Dr. Gollan, in 1-2 weeks   Increase activity slowly   Complete by: As directed       Allergies as of 05/25/2023       Reactions   Sulfa Antibiotics Other (See Comments)   Childhood reaction         Medication List     STOP taking these medications    amLODipine  5 MG tablet Commonly known as: NORVASC        TAKE these medications    atorvastatin  20 MG tablet Commonly known as: LIPITOR Take 1 tablet (20 mg total) by mouth daily.   cefdinir  300 MG capsule Commonly known as: OMNICEF  Take 1 capsule (300 mg total) by mouth 2 (two) times daily for 2 days.   denosumab  60 MG/ML Sosy injection Commonly known as: PROLIA  Inject 60 mg into the skin every 6 (six) months.   latanoprost  0.005 % ophthalmic solution Commonly known as: XALATAN Place 1 drop into the left eye at bedtime.   levothyroxine  50 MCG tablet Commonly known as: SYNTHROID  TAKE 1 TABLET EVERY DAY ON EMPTY STOMACHWITH A GLASS OF WATER AT LEAST 30-60 MINBEFORE BREAKFAST   metoprolol tartrate 25 MG tablet Commonly known as: LOPRESSOR Take 0.5 tablets (12.5 mg total) by mouth 2 (two) times daily.   pantoprazole 40 MG tablet Commonly known as: Protonix Take 1 tablet (40 mg total) by mouth daily.   PRESERVISION AREDS 2 PO Take by mouth.   SYSTANE  BALANCE OP Apply to eye.   traMADol  50 MG tablet Commonly known as: ULTRAM  Take 50 mg by mouth 3 (three) times daily as needed.   Vitamin D3 50 MCG (2000 UT) capsule Take 2,000 Units by mouth daily.   Voltaren 1 % Gel Generic drug: diclofenac Sodium Apply 2 g topically 4 (four) times daily.        Allergies  Allergen Reactions   Sulfa Antibiotics Other (See Comments)    Childhood reaction     Consultations: GI    Procedures/Studies: ECHOCARDIOGRAM COMPLETE Result Date: 05/22/2023    ECHOCARDIOGRAM REPORT   Patient Name:   Bertie Bala Date of Exam: 05/22/2023 Medical Rec #:  [id number]   Height:       59.0 in Accession #:    [id number]  Weight:       110.7 lb Date of Birth:  1927-05-26    BSA:          1.434 m Patient Age:    89 years    BP:           117/58 mmHg Patient Gender: F           HR:           81 bpm. Exam Location:  [facility] Procedure: 2D Echo, Color Doppler and Cardiac Doppler (Both Spectral and Color            Flow Doppler were utilized during procedure). Indications:     I48.91* Unspecified atrial fibrillation  History:         Patient has no prior history of Echocardiogram examinations.                  Arrythmias:Atrial Fibrillation; Risk Factors:Hypertension and                  Dyslipidemia.  Sonographer:     Sherline Distel Senior RDCS Referring Phys:  [id number] Alayne Allis NEWTON Diagnosing Phys: Belva Boyden MD IMPRESSIONS  1. Left ventricular ejection fraction, by estimation, is 60 to 65%. The left ventricle has normal function. The left ventricle has no regional wall motion abnormalities. Left ventricular diastolic parameters are indeterminate.  2. Right ventricular systolic function is normal. The right ventricular size is normal. There is mildly elevated pulmonary artery systolic pressure. The estimated right ventricular systolic pressure is 44.4 mmHg.  3. The mitral valve is normal in structure. Moderate to severe mitral valve regurgitation. No evidence of mitral stenosis.  4.  Tricuspid valve regurgitation is moderate.  5. The aortic valve is normal in structure. Aortic valve regurgitation is not visualized. No aortic stenosis is present.  6. The inferior vena cava is normal in size with greater than 50% respiratory variability, suggesting right atrial pressure of 3 mmHg. FINDINGS  Left Ventricle: Left ventricular ejection fraction, by estimation, is 60 to 65%. The left ventricle has normal function. The left ventricle has no regional wall motion abnormalities.  Strain was performed and the global longitudinal strain is indeterminate. The left ventricular internal cavity size was normal in size. There is no left ventricular hypertrophy. Left ventricular diastolic parameters are indeterminate. Right Ventricle: The right ventricular size is normal. No increase in right ventricular wall thickness. Right ventricular systolic function is normal. There is mildly elevated pulmonary artery systolic pressure. The tricuspid regurgitant velocity is 3.14  m/s, and with an assumed right atrial pressure of 5 mmHg, the estimated right ventricular systolic pressure is 44.4 mmHg. Left Atrium: Left atrial size was normal in size. Right Atrium: Right atrial size was normal in size. Pericardium: There is no evidence of pericardial effusion. Mitral Valve: The mitral valve is normal in structure. There is mild calcification of the mitral valve leaflet(s). Moderate to severe mitral valve regurgitation. No evidence of mitral valve stenosis. Tricuspid Valve: The tricuspid valve is normal in structure. Tricuspid valve regurgitation is moderate . No evidence of tricuspid stenosis. Aortic Valve: The aortic valve is normal in structure. Aortic valve regurgitation is not visualized. No aortic stenosis is present. Pulmonic Valve: The pulmonic valve was normal in structure. Pulmonic valve regurgitation is mild. No evidence of pulmonic stenosis. Aorta: The aortic root is normal in size and structure. Venous: The inferior  vena cava is normal in size with greater than 50% respiratory variability, suggesting right atrial pressure of 3 mmHg. IAS/Shunts: No atrial level shunt detected by color flow Doppler. Additional Comments: 3D was performed not requiring image post processing on an independent workstation and was indeterminate.  LEFT VENTRICLE PLAX 2D LVIDd:         4.00 cm   Diastology LVIDs:         2.10 cm   LV e' medial:    7.29 cm/s LV PW:         0.90 cm   LV E/e' medial:  18.9 LV IVS:        1.00 cm   LV e' lateral:   9.46 cm/s LVOT diam:     1.90 cm   LV E/e' lateral: 14.6 LV SV:         37 LV SV Index:   26 LVOT Area:     2.84 cm  RIGHT VENTRICLE RV S prime:     13.40 cm/s TAPSE (M-mode): 1.6 cm LEFT ATRIUM             Index        RIGHT ATRIUM           Index LA diam:        3.20 cm 2.23 cm/m   RA Area:     22.20 cm LA Vol (A2C):   53.2 ml 37.09 ml/m  RA Volume:   65.80 ml  45.87 ml/m LA Vol (A4C):   41.7 ml 29.07 ml/m LA Biplane Vol: 47.2 ml 32.91 ml/m  AORTIC VALVE LVOT Vmax:   75.00 cm/s LVOT Vmean:  56.500 cm/s LVOT VTI:    0.132 m  AORTA Ao Asc diam: 3.00 cm MITRAL VALVE                TRICUSPID VALVE MV Area (PHT): 2.29 cm     TR Peak grad:   39.4 mmHg MV Decel Time: 331 msec     TR Vmax:        314.00 cm/s MV E velocity: 138.00 cm/s  SHUNTS                             Systemic VTI:  0.13 m                             Systemic Diam: 1.90 cm Belva Boyden MD Electronically signed by Belva Boyden MD Signature Date/Time: 05/22/2023/12:47:55 PM    Final    CT CHEST WO CONTRAST Result Date: 05/21/2023 CLINICAL DATA:  Respiratory illness EXAM: CT CHEST WITHOUT CONTRAST TECHNIQUE: Multidetector CT imaging of the chest was performed following the standard protocol without IV contrast. RADIATION DOSE REDUCTION: This exam was performed according to the departmental dose-optimization program which includes automated exposure control, adjustment of the mA and/or kV according to patient size  and/or use of iterative reconstruction technique. COMPARISON:  Chest x-ray performed May 21, 2023 FINDINGS: Cardiovascular: Heart is enlarged. Calcific atherosclerosis is present in the left coronary territory. Atherosclerotic changes are also present in the thoracic aorta. Mediastinum/Nodes: Mildly enlarged lymph nodes are present which may be reactive. Lungs/Pleura: There is a focus of consolidation in the medial posterior aspect of the left lower lobe with a small parapneumonic effusion. Mucous plugging is also present within the left lower lobe airways. Upper Abdomen: Nothing significant. Musculoskeletal: Degenerative changes in the imaged osseous structures to include both shoulders. IMPRESSION: 1. Left lower lobe pneumonia with suspected parapneumonic effusion. At this is radiographically occult, consideration should be given toward follow-up CT scan in 1-2 months to ensure clearance and absence of an underlying process. Electronically Signed   By: Reagan Camera M.D.   On: 05/21/2023 14:24   DG Chest 2 View Result Date: 05/21/2023 CLINICAL DATA:  Productive cough and fatigue. EXAM: CHEST - 2 VIEW COMPARISON:  12/22/2018 FINDINGS: Opacity at the left base at least partially from pleural fluid. The underlying lung is obscured. No edema or pneumothorax. Chronic cardiomegaly. Severe glenohumeral arthropathy especially on the left with extensive calcification. IMPRESSION: Left pleural effusion obscuring the left base, possible underlying pneumonia given the history. Consider CT given the degree of fluid. Electronically Signed   By: Ronnette Coke M.D.   On: 05/21/2023 10:40   (Echo, Carotid, EGD, Colonoscopy, ERCP)    Subjective: Pt c/o fatigue    Discharge Exam: Vitals:   05/25/23 1130 05/25/23 1140  BP: (!) 114/57 (!) 118/52  Pulse: 73 76  Resp: (!) 24 (!) 24  Temp:    SpO2: 97% 96%   Vitals:   05/25/23 1119 05/25/23 1120 05/25/23 1130 05/25/23 1140  BP: (!) 113/58 (!) 113/58 (!) 114/57 (!)  118/52  Pulse: 70 73 73 76  Resp: 19 18 (!) 24 (!) 24  Temp: (!) 96.5 F (35.8 C)     TempSrc: Temporal     SpO2: 97% 97% 97% 96%  Weight:      Height:        General: Pt is alert, awake, not in acute distress Cardiovascular: S1/S2 +, no rubs, no gallops Respiratory: CTA bilaterally, no wheezing, no rhonchi Abdominal: Soft, NT, ND, bowel sounds + Extremities: no edema, no cyanosis    The results of significant diagnostics from this hospitalization (including imaging, microbiology, ancillary and laboratory) are listed below for reference.     Microbiology: Recent Results (from the past 240 hours)  Resp panel by RT-PCR (RSV, Flu A&B, Covid) Anterior Nasal Swab     Status: None  Collection Time: 05/21/23  9:34 AM   Specimen: Anterior Nasal Swab  Result Value Ref Range Status   SARS Coronavirus 2 by RT PCR NEGATIVE NEGATIVE Final    Comment: (NOTE) SARS-CoV-2 target nucleic acids are NOT DETECTED.  The SARS-CoV-2 RNA is generally detectable in upper respiratory specimens during the acute phase of infection. The lowest concentration of SARS-CoV-2 viral copies this assay can detect is 138 copies/mL. A negative result does not preclude SARS-Cov-2 infection and should not be used as the sole basis for treatment or other patient management decisions. A negative result may occur with  improper specimen collection/handling, submission of specimen other than nasopharyngeal swab, presence of viral mutation(s) within the areas targeted by this assay, and inadequate number of viral copies(<138 copies/mL). A negative result must be combined with clinical observations, patient history, and epidemiological information. The expected result is Negative.  Fact Sheet for Patients:  BloggerCourse.com  Fact Sheet for Healthcare Providers:  SeriousBroker.it  This test is no t yet approved or cleared by the [place]  FDA and  has been  authorized for detection and/or diagnosis of SARS-CoV-2 by FDA under an Emergency Use Authorization (EUA). This EUA will remain  in effect (meaning this test can be used) for the duration of the COVID-19 declaration under Section 564(b)(1) of the Act, 21 U.S.C.section 360bbb-3(b)(1), unless the authorization is terminated  or revoked sooner.       Influenza A by PCR NEGATIVE NEGATIVE Final   Influenza B by PCR NEGATIVE NEGATIVE Final    Comment: (NOTE) The Xpert Xpress SARS-CoV-2/FLU/RSV plus assay is intended as an aid in the diagnosis of influenza from Nasopharyngeal swab specimens and should not be used as a sole basis for treatment. Nasal washings and aspirates are unacceptable for Xpert Xpress SARS-CoV-2/FLU/RSV testing.  Fact Sheet for Patients: BloggerCourse.com  Fact Sheet for Healthcare Providers: SeriousBroker.it  This test is not yet approved or cleared by the [place]  FDA and has been authorized for detection and/or diagnosis of SARS-CoV-2 by FDA under an Emergency Use Authorization (EUA). This EUA will remain in effect (meaning this test can be used) for the duration of the COVID-19 declaration under Section 564(b)(1) of the Act, 21 U.S.C. section 360bbb-3(b)(1), unless the authorization is terminated or revoked.     Resp Syncytial Virus by PCR NEGATIVE NEGATIVE Final    Comment: (NOTE) Fact Sheet for Patients: BloggerCourse.com  Fact Sheet for Healthcare Providers: SeriousBroker.it  This test is not yet approved or cleared by the [place]  FDA and has been authorized for detection and/or diagnosis of SARS-CoV-2 by FDA under an Emergency Use Authorization (EUA). This EUA will remain in effect (meaning this test can be used) for the duration of the COVID-19 declaration under Section 564(b)(1) of the Act, 21 U.S.C. section 360bbb-3(b)(1), unless the  authorization is terminated or revoked.  Performed at [facility], [address] [place], [place] [address]   Expectorated Sputum Assessment w Gram Stain, Rflx to Resp Cult     Status: None   Collection Time: 05/22/23  5:35 PM   Specimen: Sputum  Result Value Ref Range Status   Specimen Description SPUTUM  Final   Special Requests NONE  Final   Sputum evaluation   Final    THIS SPECIMEN IS ACCEPTABLE FOR SPUTUM CULTURE Performed at [facility], [address] [place], [place] [address]    Report Status 05/23/2023 FINAL  Final  Culture, Respiratory w Gram Stain     Status: None  Collection Time: 05/22/23  5:35 PM   Specimen: SPU  Result Value Ref Range Status   Specimen Description   Final    SPUTUM Performed at [facility], [address] [place], [place] [address]    Special Requests   Final    NONE Reflexed from [id number] Performed at [facility], [address] [place], [place] [address]    Gram Stain   Final    NO WBC SEEN MODERATE GRAM NEGATIVE RODS RARE GRAM POSITIVE COCCI IN PAIRS RARE GRAM POSITIVE RODS    Culture   Final    FEW Normal respiratory flora-no Staph aureus or Pseudomonas seen Performed at [facility], [address] [place], [place] [address]    Report Status 05/25/2023 FINAL  Final     Labs: BNP (last 3 results) Recent Labs    05/21/23 0933  BNP 237.3*   Basic Metabolic Panel: Recent Labs  Lab 05/21/23 0933 05/22/23 0335 05/24/23 0455 05/25/23 0257  NA 137 138 140 138  K 3.6 4.1 4.2 4.2  CL 103 108 108 108  CO2 26 23 26 26   GLUCOSE 118* 174* 87 93  BUN 13 15 22 12   CREATININE 0.60 0.55 0.65 0.58  CALCIUM  8.4* 7.6* 7.9* 7.5*   Liver Function Tests: Recent Labs  Lab 05/22/23 0335  AST 24  ALT 16  ALKPHOS 46  BILITOT 1.0  PROT 5.4*  ALBUMIN 2.8*   No results for input(s): "LIPASE", "AMYLASE" in the last 168 hours. No results for input(s): "AMMONIA" in the last 168  hours. CBC: Recent Labs  Lab 05/22/23 1021 05/22/23 1415 05/23/23 1054 05/24/23 0455 05/25/23 0257  WBC 8.6 9.1 8.4 8.2 7.5  NEUTROABS 6.9 6.0 5.1 4.3 4.2  HGB 10.4* 9.5* 8.4* 8.5* 9.0*  HCT 32.0* 29.1* 26.4* 26.5* 27.6*  MCV 95.5 94.8 97.1 96.7 95.8  PLT 213 210 185 173 200   Cardiac Enzymes: No results for input(s): "CKTOTAL", "CKMB", "CKMBINDEX", "TROPONINI" in the last 168 hours. BNP: Invalid input(s): "POCBNP" CBG: No results for input(s): "GLUCAP" in the last 168 hours. D-Dimer No results for input(s): "DDIMER" in the last 72 hours. Hgb A1c No results for input(s): "HGBA1C" in the last 72 hours. Lipid Profile No results for input(s): "CHOL", "HDL", "LDLCALC", "TRIG", "CHOLHDL", "LDLDIRECT" in the last 72 hours. Thyroid  function studies Recent Labs    05/23/23 0418  TSH 3.209   Anemia work up No results for input(s): "VITAMINB12", "FOLATE", "FERRITIN", "TIBC", "IRON", "RETICCTPCT" in the last 72 hours. Urinalysis    Component Value Date/Time   COLORURINE YELLOW 04/05/2022 1414   APPEARANCEUR Cloudy (A) 04/05/2022 1414   APPEARANCEUR Clear 10/12/2019 1050   LABSPEC <=1.005 (A) 04/05/2022 1414   PHURINE 6.0 04/05/2022 1414   GLUCOSEU NEGATIVE 04/05/2022 1414   HGBUR TRACE-INTACT (A) 04/05/2022 1414   BILIRUBINUR NEGATIVE 04/05/2022 1414   BILIRUBINUR Negative 10/12/2019 1050   KETONESUR NEGATIVE 04/05/2022 1414   PROTEINUR Negative 10/12/2019 1050   UROBILINOGEN 0.2 04/05/2022 1414   NITRITE POSITIVE (A) 04/05/2022 1414   LEUKOCYTESUR LARGE (A) 04/05/2022 1414   Sepsis Labs Recent Labs  Lab 05/22/23 1415 05/23/23 1054 05/24/23 0455 05/25/23 0257  WBC 9.1 8.4 8.2 7.5   Microbiology Recent Results (from the past 240 hours)  Resp panel by RT-PCR (RSV, Flu A&B, Covid) Anterior Nasal Swab     Status: None   Collection Time: 05/21/23  9:34 AM   Specimen: Anterior Nasal Swab  Result Value Ref Range Status  SARS Coronavirus 2 by RT PCR NEGATIVE  NEGATIVE Final    Comment: (NOTE) SARS-CoV-2 target nucleic acids are NOT DETECTED.  The SARS-CoV-2 RNA is generally detectable in upper respiratory specimens during the acute phase of infection. The lowest concentration of SARS-CoV-2 viral copies this assay can detect is 138 copies/mL. A negative result does not preclude SARS-Cov-2 infection and should not be used as the sole basis for treatment or other patient management decisions. A negative result may occur with  improper specimen collection/handling, submission of specimen other than nasopharyngeal swab, presence of viral mutation(s) within the areas targeted by this assay, and inadequate number of viral copies(<138 copies/mL). A negative result must be combined with clinical observations, patient history, and epidemiological information. The expected result is Negative.  Fact Sheet for Patients:  BloggerCourse.com  Fact Sheet for Healthcare Providers:  SeriousBroker.it  This test is no t yet approved or cleared by the [place]  FDA and  has been authorized for detection and/or diagnosis of SARS-CoV-2 by FDA under an Emergency Use Authorization (EUA). This EUA will remain  in effect (meaning this test can be used) for the duration of the COVID-19 declaration under Section 564(b)(1) of the Act, 21 U.S.C.section 360bbb-3(b)(1), unless the authorization is terminated  or revoked sooner.       Influenza A by PCR NEGATIVE NEGATIVE Final   Influenza B by PCR NEGATIVE NEGATIVE Final    Comment: (NOTE) The Xpert Xpress SARS-CoV-2/FLU/RSV plus assay is intended as an aid in the diagnosis of influenza from Nasopharyngeal swab specimens and should not be used as a sole basis for treatment. Nasal washings and aspirates are unacceptable for Xpert Xpress SARS-CoV-2/FLU/RSV testing.  Fact Sheet for Patients: BloggerCourse.com  Fact Sheet for Healthcare  Providers: SeriousBroker.it  This test is not yet approved or cleared by the [place]  FDA and has been authorized for detection and/or diagnosis of SARS-CoV-2 by FDA under an Emergency Use Authorization (EUA). This EUA will remain in effect (meaning this test can be used) for the duration of the COVID-19 declaration under Section 564(b)(1) of the Act, 21 U.S.C. section 360bbb-3(b)(1), unless the authorization is terminated or revoked.     Resp Syncytial Virus by PCR NEGATIVE NEGATIVE Final    Comment: (NOTE) Fact Sheet for Patients: BloggerCourse.com  Fact Sheet for Healthcare Providers: SeriousBroker.it  This test is not yet approved or cleared by the [place]  FDA and has been authorized for detection and/or diagnosis of SARS-CoV-2 by FDA under an Emergency Use Authorization (EUA). This EUA will remain in effect (meaning this test can be used) for the duration of the COVID-19 declaration under Section 564(b)(1) of the Act, 21 U.S.C. section 360bbb-3(b)(1), unless the authorization is terminated or revoked.  Performed at [facility], [address] [place], [place] [address]   Expectorated Sputum Assessment w Gram Stain, Rflx to Resp Cult     Status: None   Collection Time: 05/22/23  5:35 PM   Specimen: Sputum  Result Value Ref Range Status   Specimen Description SPUTUM  Final   Special Requests NONE  Final   Sputum evaluation   Final    THIS SPECIMEN IS ACCEPTABLE FOR SPUTUM CULTURE Performed at [facility], [address] [place], [place] [address]    Report Status 05/23/2023 FINAL  Final  Culture, Respiratory w Gram Stain     Status: None   Collection Time: 05/22/23  5:35 PM   Specimen: SPU  Result Value Ref Range Status   Specimen  Description   Final    SPUTUM Performed at [facility], [address] [place], [place] [address]    Special  Requests   Final    NONE Reflexed from [id number] Performed at [facility], [address] [place], [place] [address]    Gram Stain   Final    NO WBC SEEN MODERATE GRAM NEGATIVE RODS RARE GRAM POSITIVE COCCI IN PAIRS RARE GRAM POSITIVE RODS    Culture   Final    FEW Normal respiratory flora-no Staph aureus or Pseudomonas seen Performed at [facility], [address], [place], [place] [address]    Report Status 05/25/2023 FINAL  Final     Time coordinating discharge: Over 30 minutes  SIGNED:   Alphonsus Jeans, MD  [facility] 05/25/2023, 3:48 PM Pager   If 7PM-7AM, please contact night-coverage [email or web address]

## 2023-05-25 NOTE — Anesthesia Postprocedure Evaluation (Signed)
 Anesthesia Post Note  Patient: Gloria Carr  Procedure(s) Performed: COLONOSCOPY  Patient location during evaluation: PACU Anesthesia Type: General Level of consciousness: awake and alert, oriented and patient cooperative Pain management: pain level controlled Vital Signs Assessment: post-procedure vital signs reviewed and stable Respiratory status: spontaneous breathing, nonlabored ventilation and respiratory function stable Cardiovascular status: blood pressure returned to baseline and stable Postop Assessment: adequate PO intake Anesthetic complications: no   No notable events documented.   Last Vitals:  Vitals:   05/25/23 1130 05/25/23 1140  BP: (!) 114/57 (!) 118/52  Pulse: 73 76  Resp: (!) 24 (!) 24  Temp:    SpO2: 97% 96%    Last Pain:  Vitals:   05/25/23 1119  TempSrc: Temporal  PainSc:                  [name]

## 2023-05-25 NOTE — Op Note (Signed)
 [facility] Gastroenterology Patient Name: Gloria Carr Procedure Date: 05/25/2023 10:47 AM MRN: [id number] Account #: [id number] Date of Birth: [birth year] Admit Type: Inpatient Age: 89 Room: [facility] ENDO ROOM 4 Gender: Female Note Status: Finalized Instrument Name: Peds Colonoscope [id number] Procedure:             Colonoscopy Indications:           Hematochezia Providers:             [name] MD, MD Referring MD:          Creta Dolin, MD (Referring MD) Medicines:             Propofol per Anesthesia Complications:         No immediate complications. Procedure:             Pre-Anesthesia Assessment:                        - Prior to the procedure, a History and Physical was                         performed, and patient medications and allergies were                         reviewed. The patient's tolerance of previous                         anesthesia was also reviewed. The risks and benefits                         of the procedure and the sedation options and risks                         were discussed with the patient. All questions were                         answered, and informed consent was obtained. Prior                         Anticoagulants: The patient has taken no anticoagulant                         or antiplatelet agents. ASA Grade Assessment: II - A                         patient with mild systemic disease. After reviewing                         the risks and benefits, the patient was deemed in                         satisfactory condition to undergo the procedure.                        After obtaining informed consent, the colonoscope was                         passed under direct vision. Throughout the procedure,  the patient's blood pressure, pulse, and oxygen                         saturations were monitored continuously. The                         Colonoscope was introduced through the anus and                          advanced to the the cecum, identified by appendiceal                         orifice and ileocecal valve. The colonoscopy was                         performed without difficulty. The patient tolerated                         the procedure well. The quality of the bowel                         preparation was excellent. Findings:      The perianal and digital rectal examinations were normal.      Multiple small-mouthed diverticula were found in the sigmoid colon.      Non-bleeding internal hemorrhoids were found during retroflexion. The       hemorrhoids were Grade I (internal hemorrhoids that do not prolapse). Impression:            - Diverticulosis in the sigmoid colon.                        - Non-bleeding internal hemorrhoids.                        - No specimens collected. Recommendation:        - Discharge patient to home.                        - Resume previous diet.                        - Continue present medications.                        - If any further bleeding then CTA with IR                         intervention for embolization. Procedure Code(s):     --- Professional ---                        [phone], Colonoscopy, flexible; diagnostic, including                         collection of specimen(s) by brushing or washing, when                         performed (separate procedure) Diagnosis Code(s):     --- Professional ---  K92.1, Melena (includes Hematochezia) CPT copyright 2022 American Medical Association. All rights reserved. The codes documented in this report are preliminary and upon coder review may  be revised to meet current compliance requirements. [name] MD, MD 05/25/2023 11:13:38 AM This report has been signed electronically. Number of Addenda: 0 Note Initiated On: 05/25/2023 10:47 AM Scope Withdrawal Time: 0 hours 6 minutes 19 seconds  Total Procedure Duration: 0 hours 15 minutes 49 seconds  Estimated Blood Loss:   Estimated blood loss: none.      [facility]

## 2023-05-25 NOTE — Anesthesia Preprocedure Evaluation (Addendum)
 Anesthesia Evaluation  Patient identified by MRN, date of birth, ID band Patient awake    Reviewed: Allergy & Precautions, NPO status , Patient's Chart, lab work & pertinent test results  History of Anesthesia Complications Negative for: history of anesthetic complications  Airway Mallampati: II   Neck ROM: Full    Dental  (+) Missing, Partial Lower, Upper Dentures   Pulmonary pneumonia, unresolved, former smoker (quit age 7s)   Pulmonary exam normal breath sounds clear to auscultation       Cardiovascular hypertension, Normal cardiovascular exam+ dysrhythmias (a fib)  Rhythm:Regular Rate:Normal  ECG 05/21/23: a fib   Neuro/Psych negative neurological ROS     GI/Hepatic negative GI ROS,,,  Endo/Other  Hypothyroidism  Prediabetes   Renal/GU negative Renal ROS     Musculoskeletal   Abdominal   Peds  Hematology negative hematology ROS (+)   Anesthesia Other Findings   Reproductive/Obstetrics                             Anesthesia Physical Anesthesia Plan  ASA: 3  Anesthesia Plan: General   Post-op Pain Management:    Induction: Intravenous  PONV Risk Score and Plan: 3 and Propofol infusion, TIVA and Treatment may vary due to age or medical condition  Airway Management Planned: Natural Airway  Additional Equipment:   Intra-op Plan:   Post-operative Plan:   Informed Consent: I have reviewed the patients History and Physical, chart, labs and discussed the procedure including the risks, benefits and alternatives for the proposed anesthesia with the patient or authorized representative who has indicated his/her understanding and acceptance.       Plan Discussed with: CRNA  Anesthesia Plan Comments: (LMA/GETA backup discussed.  Patient consented for risks of anesthesia including but not limited to:  - adverse reactions to medications - damage to eyes, teeth, lips or other oral  mucosa - nerve damage due to positioning  - sore throat or hoarseness - damage to heart, brain, nerves, lungs, other parts of body or loss of life  Informed patient about role of CRNA in peri- and intra-operative care.  Patient voiced understanding.)        Anesthesia Quick Evaluation

## 2023-05-25 NOTE — TOC Progression Note (Signed)
 Transition of Care [facility]) - Progression Note    Patient Details  Name: Gloria Carr MRN: [id number] Date of Birth: February 24, 1927  [facility] CM/SW Contact  Baird Bombard, RN Phone Number: 05/25/2023, 3:43 PM  Clinical Narrative:    Message left for [place] at [facility].  No answer, left a message.          Expected Discharge Plan and Services                                               Social Determinants of Health (SDOH) Interventions SDOH Screenings   Food Insecurity: No Food Insecurity (05/21/2023)  Housing: Unknown (05/21/2023)  Transportation Needs: No Transportation Needs (05/21/2023)  Utilities: Not At Risk (05/21/2023)  Alcohol Screen: Low Risk  (01/03/2023)  Depression (PHQ2-9): Medium Risk (01/10/2023)  Financial Resource Strain: Low Risk  (01/27/2023)   Received from [facility]  Physical Activity: Insufficiently Active (01/03/2023)  Social Connections: Moderately Isolated (05/21/2023)  Stress: No Stress Concern Present (01/03/2023)  Tobacco Use: Medium Risk (05/21/2023)  Health Literacy: Adequate Health Literacy (01/03/2023)    Readmission Risk Interventions     No data to display

## 2023-05-26 ENCOUNTER — Encounter: Payer: Self-pay | Admitting: Gastroenterology

## 2023-05-26 ENCOUNTER — Telehealth: Payer: Self-pay

## 2023-05-26 LAB — LEGIONELLA PNEUMOPHILA SEROGP 1 UR AG: L. pneumophila Serogp 1 Ur Ag: NEGATIVE

## 2023-05-26 NOTE — Transitions of Care (Post Inpatient/ED Visit) (Signed)
 05/26/2023  Name: Gloria Carr MRN: [id number] DOB: 02-Mar-1927  Today's TOC FU Call Status: Today's TOC FU Call Status:: Successful TOC FU Call Completed TOC FU Call Complete Date: 05/26/23 Patient's Name and Date of Birth confirmed.  Transition Care Management Follow-up Telephone Call Date of Discharge: 05/25/23 Discharge Facility: [facility]) Type of Discharge: Inpatient Admission Primary Inpatient Discharge Diagnosis:: encephalocele How have you been since you were released from the hospital?: Better Any questions or concerns?: No  Items Reviewed: Did you receive and understand the discharge instructions provided?: Yes Medications obtained,verified, and reconciled?: Yes (Medications Reviewed) Any new allergies since your discharge?: No Dietary orders reviewed?: Yes Do you have support at home?: Yes People in Home [RPT]: child(ren), adult  Medications Reviewed Today: Medications Reviewed Today     Reviewed by Darrall Ellison, LPN (Licensed Practical Nurse) on 05/26/23 at [phone]  Med List Status: <None>   Medication Order Taking? Sig Documenting Provider Last Dose Status Informant  atorvastatin  (LIPITOR) 20 MG tablet [id number] No Take 1 tablet (20 mg total) by mouth daily. Thersia Flax, MD 05/20/2023 Evening Active Self, Pharmacy Records  cefdinir  (OMNICEF ) 300 MG capsule [id number]  Take 1 capsule (300 mg total) by mouth 2 (two) times daily for 2 days. Alphonsus Jeans, MD  Active   Cholecalciferol (VITAMIN D3) 50 MCG (2000 UT) capsule [id number] No Take 2,000 Units by mouth daily. [provider] 05/20/2023 Active Self, Pharmacy Records  denosumab  (PROLIA ) 60 MG/ML SOSY injection [id number] No Inject 60 mg into the skin every 6 (six) months. [provider] Taking Active Self, Pharmacy Records  diclofenac Sodium (VOLTAREN) 1 % GEL [id number] No Apply 2 g topically 4 (four) times daily. [provider] Taking Active Self,  Pharmacy Records           Med Note Merline Starr   Sat May 21, 2023  3:07 PM) PRN  latanoprost (XALATAN) 0.005 % ophthalmic solution [id number] No Place 1 drop into the left eye at bedtime. [provider] 05/20/2023 Active Self, Pharmacy Records           Med Note Merline Starr   Sat May 21, 2023  3:11 PM)    levothyroxine  (SYNTHROID ) 50 MCG tablet [id number] No TAKE 1 TABLET EVERY DAY ON EMPTY STOMACHWITH A GLASS OF WATER AT LEAST 30-60 MINBEFORE BREAKFAST Thersia Flax, MD 05/20/2023 Active Self, Pharmacy Records  metoprolol tartrate (LOPRESSOR) 25 MG tablet [id number]  Take 0.5 tablets (12.5 mg total) by mouth 2 (two) times daily. Alphonsus Jeans, MD  Active   Multiple Vitamins-Minerals (PRESERVISION AREDS 2 PO) [id number] No Take by mouth. [provider] 05/20/2023 Active Self, Pharmacy Records  pantoprazole (PROTONIX) 40 MG tablet [id number]  Take 1 tablet (40 mg total) by mouth daily. Alphonsus Jeans, MD  Active   Propylene Glycol (SYSTANE BALANCE OP) [id number] No Apply to eye. [provider] 05/20/2023 Active Self, Pharmacy Records  traMADol  (ULTRAM ) 50 MG tablet [id number] No Take 50 mg by mouth 3 (three) times daily as needed.  Patient not taking: Reported on 05/21/2023   [provider] Not Taking Active Self, Pharmacy Records            Home Care and Equipment/Supplies: Were Home Health Services Ordered?: NA Any new equipment or medical supplies ordered?: NA  Functional Questionnaire: Do you need assistance with bathing/showering or dressing?: No Do you need assistance with meal preparation?: No Do you need assistance with eating?: No Do you  have difficulty maintaining continence: No Do you need assistance with getting out of bed/getting out of a chair/moving?: No Do you have difficulty managing or taking your medications?: No  Follow up appointments reviewed: PCP Follow-up appointment confirmed?: No (no avail appt, sent message to  staff to schedule) MD Provider Line Number:[phone] Given: No Specialist Hospital Follow-up appointment confirmed?: No Reason Specialist Follow-Up Not Confirmed: Patient has Specialist Provider Number and will Call for Appointment Do you need transportation to your follow-up appointment?: No Do you understand care options if your condition(s) worsen?: Yes-patient verbalized understanding    SIGNATURE Darrall Ellison, LPN [facility] Nurse Health Advisor Direct Dial [phone]

## 2023-05-27 DIAGNOSIS — R2689 Other abnormalities of gait and mobility: Secondary | ICD-10-CM | POA: Diagnosis not present

## 2023-05-27 DIAGNOSIS — I480 Paroxysmal atrial fibrillation: Secondary | ICD-10-CM | POA: Diagnosis not present

## 2023-05-27 DIAGNOSIS — J189 Pneumonia, unspecified organism: Secondary | ICD-10-CM | POA: Diagnosis not present

## 2023-05-27 DIAGNOSIS — M5459 Other low back pain: Secondary | ICD-10-CM | POA: Diagnosis not present

## 2023-05-27 DIAGNOSIS — R296 Repeated falls: Secondary | ICD-10-CM | POA: Diagnosis not present

## 2023-05-27 DIAGNOSIS — Z9181 History of falling: Secondary | ICD-10-CM | POA: Diagnosis not present

## 2023-05-27 DIAGNOSIS — R2681 Unsteadiness on feet: Secondary | ICD-10-CM | POA: Diagnosis not present

## 2023-05-27 DIAGNOSIS — M6281 Muscle weakness (generalized): Secondary | ICD-10-CM | POA: Diagnosis not present

## 2023-05-27 NOTE — Telephone Encounter (Signed)
 Spoke with pt and scheduled her for a hospital follow up next week.

## 2023-05-30 DIAGNOSIS — H353221 Exudative age-related macular degeneration, left eye, with active choroidal neovascularization: Secondary | ICD-10-CM | POA: Diagnosis not present

## 2023-05-31 DIAGNOSIS — M5459 Other low back pain: Secondary | ICD-10-CM | POA: Diagnosis not present

## 2023-05-31 DIAGNOSIS — R296 Repeated falls: Secondary | ICD-10-CM | POA: Diagnosis not present

## 2023-05-31 DIAGNOSIS — R2681 Unsteadiness on feet: Secondary | ICD-10-CM | POA: Diagnosis not present

## 2023-06-01 ENCOUNTER — Ambulatory Visit: Admitting: Internal Medicine

## 2023-06-01 ENCOUNTER — Ambulatory Visit (INDEPENDENT_AMBULATORY_CARE_PROVIDER_SITE_OTHER)

## 2023-06-01 ENCOUNTER — Encounter: Payer: Self-pay | Admitting: Internal Medicine

## 2023-06-01 VITALS — BP 136/64 | HR 80 | Ht 59.0 in | Wt 108.6 lb

## 2023-06-01 DIAGNOSIS — R300 Dysuria: Secondary | ICD-10-CM | POA: Diagnosis not present

## 2023-06-01 DIAGNOSIS — R9389 Abnormal findings on diagnostic imaging of other specified body structures: Secondary | ICD-10-CM | POA: Diagnosis not present

## 2023-06-01 DIAGNOSIS — I4891 Unspecified atrial fibrillation: Secondary | ICD-10-CM | POA: Diagnosis not present

## 2023-06-01 DIAGNOSIS — J90 Pleural effusion, not elsewhere classified: Secondary | ICD-10-CM

## 2023-06-01 DIAGNOSIS — R0602 Shortness of breath: Secondary | ICD-10-CM

## 2023-06-01 DIAGNOSIS — K922 Gastrointestinal hemorrhage, unspecified: Secondary | ICD-10-CM | POA: Diagnosis not present

## 2023-06-01 DIAGNOSIS — D649 Anemia, unspecified: Secondary | ICD-10-CM

## 2023-06-01 DIAGNOSIS — R918 Other nonspecific abnormal finding of lung field: Secondary | ICD-10-CM | POA: Diagnosis not present

## 2023-06-01 DIAGNOSIS — R296 Repeated falls: Secondary | ICD-10-CM | POA: Diagnosis not present

## 2023-06-01 DIAGNOSIS — J189 Pneumonia, unspecified organism: Secondary | ICD-10-CM | POA: Diagnosis not present

## 2023-06-01 DIAGNOSIS — Z09 Encounter for follow-up examination after completed treatment for conditions other than malignant neoplasm: Secondary | ICD-10-CM

## 2023-06-01 DIAGNOSIS — J984 Other disorders of lung: Secondary | ICD-10-CM | POA: Diagnosis not present

## 2023-06-01 DIAGNOSIS — D500 Iron deficiency anemia secondary to blood loss (chronic): Secondary | ICD-10-CM

## 2023-06-01 DIAGNOSIS — R2681 Unsteadiness on feet: Secondary | ICD-10-CM | POA: Diagnosis not present

## 2023-06-01 DIAGNOSIS — M5459 Other low back pain: Secondary | ICD-10-CM | POA: Diagnosis not present

## 2023-06-01 LAB — CBC WITH DIFFERENTIAL/PLATELET
Basophils Absolute: 0.1 10*3/uL (ref 0.0–0.1)
Basophils Relative: 0.8 % (ref 0.0–3.0)
Eosinophils Absolute: 0.1 10*3/uL (ref 0.0–0.7)
Eosinophils Relative: 0.8 % (ref 0.0–5.0)
HCT: 27.5 % — ABNORMAL LOW (ref 36.0–46.0)
Hemoglobin: 9.1 g/dL — ABNORMAL LOW (ref 12.0–15.0)
Lymphocytes Relative: 27.5 % (ref 12.0–46.0)
Lymphs Abs: 2.3 10*3/uL (ref 0.7–4.0)
MCHC: 32.9 g/dL (ref 30.0–36.0)
MCV: 90.7 fl (ref 78.0–100.0)
Monocytes Absolute: 1 10*3/uL (ref 0.1–1.0)
Monocytes Relative: 12.2 % — ABNORMAL HIGH (ref 3.0–12.0)
Neutro Abs: 4.9 10*3/uL (ref 1.4–7.7)
Neutrophils Relative %: 58.7 % (ref 43.0–77.0)
Platelets: 352 10*3/uL (ref 150.0–400.0)
RBC: 3.03 Mil/uL — ABNORMAL LOW (ref 3.87–5.11)
RDW: 13.7 % (ref 11.5–15.5)
WBC: 8.4 10*3/uL (ref 4.0–10.5)

## 2023-06-01 MED ORDER — LEVOTHYROXINE SODIUM 50 MCG PO TABS: ORAL_TABLET | ORAL | 1 refills | Status: AC

## 2023-06-01 MED ORDER — ATORVASTATIN CALCIUM 20 MG PO TABS: 20.0000 mg | ORAL_TABLET | Freq: Every day | ORAL | 3 refills | Status: DC

## 2023-06-01 NOTE — Patient Instructions (Addendum)
 Your shortness of breath may be due to unresolved pneumonia ,  your irregular heart rhythm,  or your anemia  (or a combination !)  The amlodipine  was stopped because metoprolol  was started to control your heart rate,  and it also lowers your blood pressure so you don't need both    Chest  ray and labs today EKG today  We will repeat the CT chest in mid June

## 2023-06-01 NOTE — Progress Notes (Unsigned)
 Subjective:  Patient ID: Gloria Carr, female    DOB: 01-Jul-1927  Age: 89 y.o. MRN: [id number]  CC: The primary encounter diagnosis was Abnormal CT of the chest. Diagnoses of Dysuria, Anemia, unspecified type, Atrial fibrillation, unspecified type (HCC), Gastrointestinal hemorrhage, unspecified gastrointestinal hemorrhage type, Pneumonia of left lower lobe due to infectious organism, Iron deficiency anemia due to chronic blood loss, Shortness of breath, Hospital discharge follow-up, and Pleural effusion on left were also pertinent to this visit.   HPI Gloria Carr presents for  Chief Complaint  Patient presents with   Hospitalization Follow-up   Gloria Carr is 89 yr old female who is being seen today for hospital follow up.  She is accompanied by her son Gloria Carr.    Gloria Carr was  admitted to [facility] on May 10 with  shortness of breath without cough or fevers concerning for CAP and  new onset atrial fib.  During the admission she developed  hematochezia and was evaluated with colonoscopy by [name].Aaron Aas    1) PNA:  Chest CT : LLL infiltrate with parapneumonic effusion and mucous plugging was noted.  She was treated with rocephin  and azithromycin  ,,  discharged on May 14 with  cefdinir  only (300 mg bid x 2 days) .  She completed her abx course but states tht  she remains short of breath, with no appreciable improvement since being admitted.  She  has resumed PT   2) Metabolic encephalopathy:  not diagnosed or discussed in DC summary, but  son and patient state that she was having visual hallucinations during hospitalization which have resolved.   3) AF: new onset .  ECHO done:  EF 65%   mild [pulm HTN.  Mod to severe MR,  mod TR. Seen by cardiology,  Not a candidate for [facility]' procedure and not a candidate for anticoagulation due to problem #4.  At discharge she was advised to Follow up 2 weeks with cardiology, but was not given an appt or referral .  . Amlodipine  was stopped,  presumably due to relative  hypotension  following initiation of metoprolol   to control heart rate   4) hematochezia:  started during hospitalization with BRBPR  her hgb dropped  from 13 on admission to 8.5  and a diagnostic colonoscopy was  done , no findings to explain the drop of 5 pts.  The bleeding was (per son) atttributed to constipation causing hemorrhoidal bleeding . No iron studies b12 or folate done.    5)  dysuria started this morning    Outpatient Medications Prior to Visit  Medication Sig Dispense Refill   Cholecalciferol (VITAMIN D3) 50 MCG (2000 UT) capsule Take 2,000 Units by mouth daily.     denosumab  (PROLIA ) 60 MG/ML SOSY injection Inject 60 mg into the skin every 6 (six) months.     diclofenac Sodium (VOLTAREN) 1 % GEL Apply 2 g topically 4 (four) times daily.     latanoprost (XALATAN) 0.005 % ophthalmic solution Place 1 drop into the left eye at bedtime.  5   metoprolol  tartrate (LOPRESSOR ) 25 MG tablet Take 0.5 tablets (12.5 mg total) by mouth 2 (two) times daily. 30 tablet 0   Multiple Vitamins-Minerals (PRESERVISION AREDS 2 PO) Take by mouth.     pantoprazole  (PROTONIX ) 40 MG tablet Take 1 tablet (40 mg total) by mouth daily. 30 tablet 0   Propylene Glycol (SYSTANE BALANCE OP) Apply to eye.     traMADol  (ULTRAM ) 50 MG tablet Take 50 mg by mouth 3 (three)  times daily as needed.     atorvastatin  (LIPITOR) 20 MG tablet Take 1 tablet (20 mg total) by mouth daily. 90 tablet 3   levothyroxine  (SYNTHROID ) 50 MCG tablet TAKE 1 TABLET EVERY DAY ON EMPTY STOMACHWITH A GLASS OF WATER AT LEAST 30-60 MINBEFORE BREAKFAST 90 tablet 1   No facility-administered medications prior to visit.    Review of Systems;  Patient denies headache, fevers, malaise, unintentional weight loss, skin rash, eye pain, sinus congestion and sinus pain, sore throat, dysphagia,  hemoptysis , cough, wheezing, chest pain, palpitations, orthopnea, edema, abdominal pain, nausea, melena, diarrhea, , flank pain, dysuria, hematuria,  urinary  Frequency, nocturia, numbness, tingling, seizures,  Focal weakness, Loss of consciousness,  Tremor, insomnia, depression, anxiety, and suicidal ideation.      Objective:  BP 136/64   Pulse 80   Ht 4\' 11"  (1.499 m)   Wt 108 lb 9.6 oz (49.3 kg)   LMP  (LMP Unknown)   SpO2 96%   BMI 21.93 kg/m   BP Readings from Last 3 Encounters:  06/01/23 136/64  05/25/23 (!) 118/52  01/10/23 132/78    Wt Readings from Last 3 Encounters:  06/01/23 108 lb 9.6 oz (49.3 kg)  05/21/23 110 lb 11.2 oz (50.2 kg)  01/10/23 104 lb (47.2 kg)    Physical Exam Vitals reviewed.  Constitutional:      General: She is not in acute distress.    Appearance: Normal appearance. She is normal weight. She is not ill-appearing, toxic-appearing or diaphoretic.  HENT:     Head: Normocephalic.  Eyes:     General: No scleral icterus.       Right eye: No discharge.        Left eye: No discharge.     Conjunctiva/sclera: Conjunctivae normal.  Cardiovascular:     Rate and Rhythm: Normal rate and regular rhythm.     Heart sounds: Normal heart sounds.  Pulmonary:     Effort: Pulmonary effort is normal. No respiratory distress.     Breath sounds: Normal breath sounds.  Musculoskeletal:        General: Normal range of motion.  Skin:    General: Skin is warm and dry.  Neurological:     General: No focal deficit present.     Mental Status: She is alert and oriented to person, place, and time. Mental status is at baseline.  Psychiatric:        Mood and Affect: Mood normal.        Behavior: Behavior normal.        Thought Content: Thought content normal.        Judgment: Judgment normal.    Lab Results  Component Value Date   HGBA1C 6.2 01/10/2023   HGBA1C 5.9 03/26/2022   HGBA1C 5.9 06/29/2021    Lab Results  Component Value Date   CREATININE 0.58 05/25/2023   CREATININE 0.65 05/24/2023   CREATININE 0.55 05/22/2023    Lab Results  Component Value Date   WBC 8.4 06/01/2023   HGB 9.1 (L)  06/01/2023   HCT 27.5 (L) 06/01/2023   PLT 352.0 06/01/2023   GLUCOSE 93 05/25/2023   CHOL 155 01/10/2023   TRIG 71.0 01/10/2023   HDL 81.70 01/10/2023   LDLDIRECT 47.0 01/10/2023   LDLCALC 60 01/10/2023   ALT 16 05/22/2023   AST 24 05/22/2023   NA 138 05/25/2023   K 4.2 05/25/2023   CL 108 05/25/2023   CREATININE 0.58 05/25/2023   BUN 12 05/25/2023  CO2 26 05/25/2023   TSH 3.209 05/23/2023   HGBA1C 6.2 01/10/2023   MICROALBUR 1.1 03/26/2022    ECHOCARDIOGRAM COMPLETE Result Date: 05/22/2023    ECHOCARDIOGRAM REPORT   Patient Name:   Glendoris Guilford Date of Exam: 05/22/2023 Medical Rec #:  [id number]   Height:       59.0 in Accession #:    [id number]  Weight:       110.7 lb Date of Birth:  [birth year]    BSA:          1.434 m Patient Age:    89 years    BP:           117/58 mmHg Patient Gender: F           HR:           81 bpm. Exam Location:  [facility] Procedure: 2D Echo, Color Doppler and Cardiac Doppler (Both Spectral and Color            Flow Doppler were utilized during procedure). Indications:     I48.91* Unspecified atrial fibrillation  History:         Patient has no prior history of Echocardiogram examinations.                  Arrythmias:Atrial Fibrillation; Risk Factors:Hypertension and                  Dyslipidemia.  Sonographer:     Sherline Distel Senior RDCS Referring Phys:  [id number] Alayne Allis NEWTON Diagnosing Phys: Belva Boyden MD IMPRESSIONS  1. Left ventricular ejection fraction, by estimation, is 60 to 65%. The left ventricle has normal function. The left ventricle has no regional wall motion abnormalities. Left ventricular diastolic parameters are indeterminate.  2. Right ventricular systolic function is normal. The right ventricular size is normal. There is mildly elevated pulmonary artery systolic pressure. The estimated right ventricular systolic pressure is 44.4 mmHg.  3. The mitral valve is normal in structure. Moderate to severe mitral valve regurgitation. No evidence of mitral stenosis.  4.  Tricuspid valve regurgitation is moderate.  5. The aortic valve is normal in structure. Aortic valve regurgitation is not visualized. No aortic stenosis is present.  6. The inferior vena cava is normal in size with greater than 50% respiratory variability, suggesting right atrial pressure of 3 mmHg. FINDINGS  Left Ventricle: Left ventricular ejection fraction, by estimation, is 60 to 65%. The left ventricle has normal function. The left ventricle has no regional wall motion abnormalities. Strain was performed and the global longitudinal strain is indeterminate. The left ventricular internal cavity size was normal in size. There is no left ventricular hypertrophy. Left ventricular diastolic parameters are indeterminate. Right Ventricle: The right ventricular size is normal. No increase in right ventricular wall thickness. Right ventricular systolic function is normal. There is mildly elevated pulmonary artery systolic pressure. The tricuspid regurgitant velocity is 3.14  m/s, and with an assumed right atrial pressure of 5 mmHg, the estimated right ventricular systolic pressure is 44.4 mmHg. Left Atrium: Left atrial size was normal in size. Right Atrium: Right atrial size was normal in size. Pericardium: There is no evidence of pericardial effusion. Mitral Valve: The mitral valve is normal in structure. There is mild calcification of the mitral valve leaflet(s). Moderate to severe mitral valve regurgitation. No evidence of mitral valve stenosis. Tricuspid Valve: The tricuspid valve is normal in structure. Tricuspid valve regurgitation is moderate . No evidence of tricuspid stenosis. Aortic Valve: The aortic valve is  normal in structure. Aortic valve regurgitation is not visualized. No aortic stenosis is present. Pulmonic Valve: The pulmonic valve was normal in structure. Pulmonic valve regurgitation is mild. No evidence of pulmonic stenosis. Aorta: The aortic root is normal in size and structure. Venous: The inferior  vena cava is normal in size with greater than 50% respiratory variability, suggesting right atrial pressure of 3 mmHg. IAS/Shunts: No atrial level shunt detected by color flow Doppler. Additional Comments: 3D was performed not requiring image post processing on an independent workstation and was indeterminate.  LEFT VENTRICLE PLAX 2D LVIDd:         4.00 cm   Diastology LVIDs:         2.10 cm   LV e' medial:    7.29 cm/s LV PW:         0.90 cm   LV E/e' medial:  18.9 LV IVS:        1.00 cm   LV e' lateral:   9.46 cm/s LVOT diam:     1.90 cm   LV E/e' lateral: 14.6 LV SV:         37 LV SV Index:   26 LVOT Area:     2.84 cm  RIGHT VENTRICLE RV S prime:     13.40 cm/s TAPSE (M-mode): 1.6 cm LEFT ATRIUM             Index        RIGHT ATRIUM           Index LA diam:        3.20 cm 2.23 cm/m   RA Area:     22.20 cm LA Vol (A2C):   53.2 ml 37.09 ml/m  RA Volume:   65.80 ml  45.87 ml/m LA Vol (A4C):   41.7 ml 29.07 ml/m LA Biplane Vol: 47.2 ml 32.91 ml/m  AORTIC VALVE LVOT Vmax:   75.00 cm/s LVOT Vmean:  56.500 cm/s LVOT VTI:    0.132 m  AORTA Ao Asc diam: 3.00 cm MITRAL VALVE                TRICUSPID VALVE MV Area (PHT): 2.29 cm     TR Peak grad:   39.4 mmHg MV Decel Time: 331 msec     TR Vmax:        314.00 cm/s MV E velocity: 138.00 cm/s                             SHUNTS                             Systemic VTI:  0.13 m                             Systemic Diam: 1.90 cm Belva Boyden MD Electronically signed by Belva Boyden MD Signature Date/Time: 05/22/2023/12:47:55 PM    Final     Assessment & Plan:  .Abnormal CT of the chest -     CT CHEST WO CONTRAST; Future -     DG Chest Left Decubitus; Future -     Pulmonary Visit  Dysuria -     Urinalysis, Routine w reflex microscopic -     Urine Culture; Future  Anemia, unspecified type -     CBC with Differential/Platelet -     Iron, TIBC and Ferritin  Panel  Atrial fibrillation, unspecified type [facility]) Assessment & Plan: New onset,  with normal EF  and mod to severe MR.  Continue metoprolol .  Cannot anticoagulate given GI bleed , the source of which has not been identified despite hospitalization and acute drop in hgb to 8.5 .  Iron studies ordered today (not done during hospitalization)  are LOW.   She was not referred to cardiology and has no follow up despite dc summary to the contrary.  Urgent cardiology referral made to Dr Jerelene Monday   Orders: -     EKG 12-Lead -     Ambulatory referral to Cardiology  Gastrointestinal hemorrhage, unspecified gastrointestinal hemorrhage type Assessment & Plan: During hospitalization she had a GI bleed , the source of which has not been identified despite hospitalization and acute drop in hgb to 8.5 . EGD was not done  bc the source was consider lower GI.  Iron studies ordered today (not done during hospitalization)  are LOW.    Hgb is stable.  Will start iron and follow given her age.     Pneumonia of left lower lobe due to infectious organism -     DG Chest 2 View; Future -     DG Chest Left Decubitus; Future -     Pulmonary Visit  Iron deficiency anemia due to chronic blood loss Assessment & Plan: Colonoscopy non diagnostic May 2025 during hospitalization .  Iron studies not done in house but LOW  HOSPITAL f/u WILL START IRON AND COLACE  Lab Results  Component Value Date   IRON <10 (L) 06/01/2023   TIBC 322 06/01/2023   FERRITIN 32 06/01/2023      Shortness of breath Assessment & Plan: PRESUMED SECONDARY TO CAP , but she ha no fever or leukocytosis on admission and has not improved symptomatically despite treatment for PNA.  Repeat chest film today has not been read but appears to have a worsening left sided pleural effusion .  Will order a left lateral decubitus and pursue diagnostic thoracentesis if  patient is willing     Hospital discharge follow-up Assessment & Plan: Patient is stable post discharge but not Improved.  She has persistent symptoms and unclear diagnoses.  I anticipate  a repeat admission due to the nature of her symptoms.. All labs , imaging studies and progress notes from admission were reviewed with patient and son  today      Pleural effusion on left -     DG Chest Left Decubitus; Future -     Pulmonary Visit  Other orders -     Atorvastatin  Calcium ; Take 1 tablet (20 mg total) by mouth daily.  Dispense: 90 tablet; Refill: 3 -     Levothyroxine  Sodium; TAKE 1 TABLET EVERY DAY ON EMPTY STOMACHWITH A GLASS OF WATER AT LEAST 30-60 MINBEFORE BREAKFAST  Dispense: 90 tablet; Refill: 1     I spent 34 minutes on the day of this face to face encounter reviewing patient's  most recent visit with cardiology,  nephrology,  and neurology,  prior relevant surgical and non surgical procedures, recent  labs and imaging studies, counseling on weight management,  reviewing the assessment and plan with patient, and post visit ordering and reviewing of  diagnostics and therapeutics with patient  .   Follow-up: No follow-ups on file.   Thersia Flax, MD

## 2023-06-02 ENCOUNTER — Other Ambulatory Visit: Payer: Self-pay | Admitting: Internal Medicine

## 2023-06-02 ENCOUNTER — Telehealth: Payer: Self-pay | Admitting: Pulmonary Disease

## 2023-06-02 ENCOUNTER — Ambulatory Visit
Admission: RE | Admit: 2023-06-02 | Discharge: 2023-06-02 | Disposition: A | Discharge status: Home / Self Care | Source: Ambulatory Visit | Attending: Internal Medicine | Admitting: Internal Medicine

## 2023-06-02 ENCOUNTER — Other Ambulatory Visit
Admission: RE | Admit: 2023-06-02 | Discharge: 2023-06-02 | Disposition: A | Discharge status: Home / Self Care | Source: Ambulatory Visit | Attending: Internal Medicine | Admitting: Internal Medicine

## 2023-06-02 ENCOUNTER — Ambulatory Visit: Payer: Self-pay | Admitting: Internal Medicine

## 2023-06-02 DIAGNOSIS — R9389 Abnormal findings on diagnostic imaging of other specified body structures: Secondary | ICD-10-CM | POA: Diagnosis not present

## 2023-06-02 DIAGNOSIS — R2689 Other abnormalities of gait and mobility: Secondary | ICD-10-CM | POA: Diagnosis not present

## 2023-06-02 DIAGNOSIS — J189 Pneumonia, unspecified organism: Secondary | ICD-10-CM | POA: Insufficient documentation

## 2023-06-02 DIAGNOSIS — I480 Paroxysmal atrial fibrillation: Secondary | ICD-10-CM | POA: Diagnosis not present

## 2023-06-02 DIAGNOSIS — D649 Anemia, unspecified: Secondary | ICD-10-CM | POA: Diagnosis not present

## 2023-06-02 DIAGNOSIS — R2681 Unsteadiness on feet: Secondary | ICD-10-CM | POA: Diagnosis not present

## 2023-06-02 DIAGNOSIS — J90 Pleural effusion, not elsewhere classified: Secondary | ICD-10-CM

## 2023-06-02 DIAGNOSIS — M6281 Muscle weakness (generalized): Secondary | ICD-10-CM | POA: Diagnosis not present

## 2023-06-02 DIAGNOSIS — D500 Iron deficiency anemia secondary to blood loss (chronic): Secondary | ICD-10-CM | POA: Insufficient documentation

## 2023-06-02 DIAGNOSIS — R300 Dysuria: Secondary | ICD-10-CM | POA: Diagnosis not present

## 2023-06-02 DIAGNOSIS — Z9181 History of falling: Secondary | ICD-10-CM | POA: Diagnosis not present

## 2023-06-02 DIAGNOSIS — M19071 Primary osteoarthritis, right ankle and foot: Secondary | ICD-10-CM | POA: Diagnosis not present

## 2023-06-02 DIAGNOSIS — S93421D Sprain of deltoid ligament of right ankle, subsequent encounter: Secondary | ICD-10-CM | POA: Diagnosis not present

## 2023-06-02 DIAGNOSIS — Z09 Encounter for follow-up examination after completed treatment for conditions other than malignant neoplasm: Secondary | ICD-10-CM | POA: Insufficient documentation

## 2023-06-02 LAB — IRON,TIBC AND FERRITIN PANEL
%SAT: 2 % — ABNORMAL LOW (ref 16–45)
Ferritin: 32 ng/mL (ref 16–288)
Iron: <10 ug/dL — ABNORMAL LOW (ref 45–160)
TIBC: 322 ug/dL (ref 250–450)

## 2023-06-02 LAB — COMPREHENSIVE METABOLIC PANEL WITH GFR
ALT: 27 U/L (ref 0–44)
AST: 32 U/L (ref 15–41)
Albumin: 2.8 g/dL — ABNORMAL LOW (ref 3.5–5.0)
Alkaline Phosphatase: 51 U/L (ref 38–126)
Anion gap: 9 (ref 5–15)
BUN: 14 mg/dL (ref 8–23)
CO2: 26 mmol/L (ref 22–32)
Calcium: 8 mg/dL — ABNORMAL LOW (ref 8.9–10.3)
Chloride: 102 mmol/L (ref 98–111)
Creatinine, Ser: 0.71 mg/dL (ref 0.44–1.00)
GFR, Estimated: >60 mL/min (ref >60)
Glucose, Bld: 130 mg/dL — ABNORMAL HIGH (ref 70–99)
Potassium: 3.4 mmol/L — ABNORMAL LOW (ref 3.5–5.1)
Sodium: 137 mmol/L (ref 135–145)
Total Bilirubin: 0.8 mg/dL (ref 0.0–1.2)
Total Protein: 5.5 g/dL — ABNORMAL LOW (ref 6.5–8.1)

## 2023-06-02 LAB — LACTATE DEHYDROGENASE: LDH: 193 U/L — ABNORMAL HIGH (ref 98–192)

## 2023-06-02 MED ORDER — FERROUS SULFATE 325 (65 FE) MG PO TABS: 325.0000 mg | ORAL_TABLET | Freq: Every day | ORAL | 2 refills | Status: DC

## 2023-06-02 NOTE — Assessment & Plan Note (Addendum)
 Presumed based on chest x ray,  but there has been no improvmenet and repeat x ray looks worse w regard to pleural effusiion .    Lateral decub and thoracentesis ordered   pulmonary consult as well

## 2023-06-02 NOTE — Telephone Encounter (Signed)
 Pt to see Dr. Viva Grise week of 06/07/23 for Pleurel effusion/pnuemonia//mm

## 2023-06-02 NOTE — Assessment & Plan Note (Addendum)
 New onset,  with normal EF and mod to severe MR.   I have ordered and reviewed a 12 lead EKG and find that there she remains in atrial fibrillation,  rate controlled. .   Continue metoprolol .  Cannot anticoagulate given GI bleed , the source of which has not been identified . Gloria Carr  Urgent cardiology referral made to Dr Jerelene Monday  today as this was not done prior to discharge

## 2023-06-02 NOTE — Assessment & Plan Note (Signed)
 Colonoscopy non diagnostic May 2025 during hospitalization .  Iron studies not done in house but LOW  HOSPITAL f/u WILL START IRON AND COLACE  Lab Results  Component Value Date   IRON <10 (L) 06/01/2023   TIBC 322 06/01/2023   FERRITIN 32 06/01/2023

## 2023-06-02 NOTE — Assessment & Plan Note (Signed)
 During hospitalization she had a GI bleed , the source of which has not been identified despite hospitalization and acute drop in hgb to 8.5 . EGD was not done  bc the source was consider lower GI.  Iron studies ordered today (not done during hospitalization)  are LOW.    Hgb is stable.  Will start iron and follow given her age.

## 2023-06-02 NOTE — Assessment & Plan Note (Signed)
 Patient is stable post discharge but not Improved.  She has persistent symptoms and unclear diagnoses.  I anticipate a repeat admission due to the nature of her symptoms.. All labs , imaging studies and progress notes from admission were reviewed with patient and son  today

## 2023-06-02 NOTE — Assessment & Plan Note (Addendum)
 PRESUMED SECONDARY TO CAP , but she ha no fever or leukocytosis on admission and has not improved symptomatically despite treatment for PNA.  Repeat chest film today has not been read but appears to have a worsening left sided pleural effusion .  Will order a left lateral decubitus and pursue diagnostic thoracentesis if  patient is willing

## 2023-06-03 ENCOUNTER — Ambulatory Visit: Attending: Student | Admitting: Student

## 2023-06-03 ENCOUNTER — Encounter: Payer: Self-pay | Admitting: Student

## 2023-06-03 VITALS — BP 112/50 | HR 68 | Ht 59.0 in | Wt 108.8 lb

## 2023-06-03 DIAGNOSIS — I340 Nonrheumatic mitral (valve) insufficiency: Secondary | ICD-10-CM

## 2023-06-03 DIAGNOSIS — I480 Paroxysmal atrial fibrillation: Secondary | ICD-10-CM

## 2023-06-03 DIAGNOSIS — I10 Essential (primary) hypertension: Secondary | ICD-10-CM | POA: Diagnosis not present

## 2023-06-03 DIAGNOSIS — R2681 Unsteadiness on feet: Secondary | ICD-10-CM | POA: Diagnosis not present

## 2023-06-03 DIAGNOSIS — R2689 Other abnormalities of gait and mobility: Secondary | ICD-10-CM | POA: Diagnosis not present

## 2023-06-03 DIAGNOSIS — K625 Hemorrhage of anus and rectum: Secondary | ICD-10-CM | POA: Diagnosis not present

## 2023-06-03 DIAGNOSIS — K449 Diaphragmatic hernia without obstruction or gangrene: Secondary | ICD-10-CM | POA: Diagnosis not present

## 2023-06-03 DIAGNOSIS — Z9181 History of falling: Secondary | ICD-10-CM | POA: Diagnosis not present

## 2023-06-03 DIAGNOSIS — J189 Pneumonia, unspecified organism: Secondary | ICD-10-CM | POA: Diagnosis not present

## 2023-06-03 DIAGNOSIS — E7800 Pure hypercholesterolemia, unspecified: Secondary | ICD-10-CM

## 2023-06-03 DIAGNOSIS — R296 Repeated falls: Secondary | ICD-10-CM | POA: Diagnosis not present

## 2023-06-03 DIAGNOSIS — M6281 Muscle weakness (generalized): Secondary | ICD-10-CM | POA: Diagnosis not present

## 2023-06-03 DIAGNOSIS — M5459 Other low back pain: Secondary | ICD-10-CM | POA: Diagnosis not present

## 2023-06-03 LAB — URINALYSIS, ROUTINE W REFLEX MICROSCOPIC
Bilirubin Urine: NEGATIVE
Hgb urine dipstick: NEGATIVE
Ketones, ur: NEGATIVE
Nitrite: NEGATIVE
Specific Gravity, Urine: 1.015 (ref 1.000–1.030)
Urine Glucose: NEGATIVE
Urobilinogen, UA: 1 (ref 0.0–1.0)
pH: 6 (ref 5.0–8.0)

## 2023-06-03 NOTE — Progress Notes (Signed)
 Cardiology Clinic Note   Date: 06/03/2023 ID: Ellora Varnum, DOB Nov 06, 1927, MRN [id number]  Primary Cardiologist:  Belva Boyden, MD  Chief Complaint   Gloria Carr is a 89 y.o. female who presents to the clinic today for hospital follow up.   Patient Profile   Gloria Carr is followed by Dr. Gollan for the history outlined below.      Past medical history significant for: PAF. Onset May 2025 in the setting of pneumonia. Echo 05/22/2023: EF 60 to 65%.  No RWMA.  Indeterminate diastolic parameters.  Normal RV size/function.  Mildly elevated PA pressure, RVSP 44.4 mmHg.  Moderate to severe MR.  Moderate TR. Hypertension. Hyperlipidemia. Lipid panel 01/10/2023: LDL 47, HDL 82, TG 71, total 155. Hypothyroidism. Macular degeneration.  In summary, patient presented to the ED via EMS on 05/21/2023 from [facility] independent living for fatigue, dyspnea, malaise and cough productive of clear sputum progressing over 1 week.  With coughing she noted intermittent palpitations.  Upon arrival she was afebrile and mildly hypertensive at 144/76, SpO2 95% on room air.  EKG demonstrated A-fib 76 bpm.  Initial labs: WBC 7.7, hemoglobin 13.7, sodium 137, potassium 3.6, creatinine 0.60, BUN 13, BNP 237.3, respiratory panel negative.  Troponin negative x 2.  Chest x-ray demonstrated left pleural effusion obscuring the left base, possible underlying pneumonia.  CT chest showed left lower lobe pneumonia with suspected parapneumonic effusion.  She was placed on azithromycin , ceftriaxone  and Lovenox  for DVT prophylaxis.  Nursing note from 3 of indicated that patient had dark red blood in her stool.  H&H decreased at 11.2/34.7>> 10.4/32.0.  Lovenox  was discontinued IV Protonix  ordered.  She reported never having bright red blood per rectum before it continued with episodes.  She continued to be asymptomatic in A-fib with heart rate in the 80s.  She has a history of hypothyroidism on levothyroxine .  TSH 3.209.  Patient  was started on low-dose beta-blocker for rate control.  Echo demonstrated normal LV function as detailed above.  Hemoglobin continue to downtrend.  Anticoagulation was deferred pending GI workup.  Patient underwent colonoscopy which demonstrated diverticulosis in the sigmoid colon thought to be the likely source of lower GI bleeding.  She was discharged on 05/25/2023 to follow-up with PCP and cardiology as an outpatient.     History of Present Illness    Today, patient is accompanied by her son. She reports continued shortness of breath with exertion and when laying down at night. She feels very fatigued and weak. She is scheduled with pulmonology next week and they will decide if she needs thoracentesis. She does feel palpitations particularly at night described as her heart beating irregularly. Prior to her hospital admission she was very independent and active at her living facility. She walked a lot with her walker. She cannot participate in activities now secondary to the weakness, fatigue and dyspnea. She denies chest pain, orthopnea or PND. Patient denies further episodes of blood in stool. She reports she did not notice it in the hospital. Her son states they would have never know there was bleeding if the nurse had not reported it. Repeat CBC on 5/21 demonstrate hemoglobin 9.1 and HCT 27.5.     ROS: All other systems reviewed and are otherwise negative except as noted in History of Present Illness.  EKGs/Labs Reviewed    EKG Interpretation Date/Time:  Friday Jun 03 2023 13:57:08 EDT Ventricular Rate:  68 PR Interval:    QRS Duration:  66 QT Interval:  396 QTC Calculation:  421 R Axis:   80  Text Interpretation: Atrial fibrillation Low voltage QRS Septal infarct (cited on or before 03-Jun-2023) When compared with ECG of 22-May-2023 08:05, Questionable change in initial forces of Anteroseptal leads Nonspecific T wave abnormality no longer evident in Inferior leads Confirmed by Morey Ar [phone]) on 06/03/2023 2:08:39 PM   06/02/2023: ALT 27; AST 32; BUN 14; Creatinine, Ser 0.71; Potassium 3.4; Sodium 137   06/01/2023: Hemoglobin 9.1; WBC 8.4   05/23/2023: TSH 3.209   05/21/2023: B Natriuretic Peptide 237.3   Risk Assessment/Calculations     CHA2DS2-VASc Score = 4   This indicates a 4.8% annual risk of stroke. The patient's score is based upon: CHF History: 0 HTN History: 1 Diabetes History: 0 Stroke History: 0 Vascular Disease History: 0 Age Score: 2 Gender Score: 1             Physical Exam    VS:  BP (!) 112/50 (BP Location: Left Arm)   Pulse 68   Ht 4\' 11"  (1.499 m)   Wt 108 lb 12.8 oz (49.4 kg)   LMP  (LMP Unknown)   SpO2 93%   BMI 21.97 kg/m  , BMI Body mass index is 21.97 kg/m.  GEN: Well nourished, well developed, in no acute distress. Neck: No JVD or carotid bruits. Cardiac: Irregularly irregular rhythm. No murmurs. No rubs or gallops.   Respiratory:  Respirations regular and unlabored. Clear to auscultation without rales, wheezing or rhonchi. GI: Soft, nontender, nondistended. Extremities: Radials/DP/PT 2+ and equal bilaterally. No clubbing or cyanosis. Mild lower extremity edema. Compression stockings in place.  Skin: Warm and dry, no rash. Neuro: Strength intact.  Assessment & Plan   PAF Onset May 2025 in the setting of pneumonia.  Echo demonstrated normal LV/RV function, mildly elevated PA pressure, moderate to severe MR.  Patient reports palpitations described a irregular heart beats. EKG shows afib with HR of 68. Patient and son are in agreement to continue to hold off on anticoagulation. They would like to discuss Watchman.  - Continue metoprolol . - Continue to defer OAC. - Refer to EP.   Mitral regurgitation Echo May 2025 demonstrate moderate to severe MR. Patient continues to have shortness of breath with exertion and laying down at night although she states propping up does not help her breathing and she sleeps on her  side.  - Repeat echo as clinically indicated.   Hypertension BP today 112/50. Patient reports weakness and fatigue but no lightheadedness or dizziness.  - Continue metoprolol .  Hyperlipidemia LDL 47 December 2021, at goal. - Continue atorvastatin .  GI bleeding Patient developed bright red blood per rectum during hospital admission for pneumonia in May 2025.  Colonoscopy performed during hospitalization demonstrated diverticulosis thought to be the source of lower GI bleeding.  Patient denies any further bleeding.  - Continue to monitor for recurrent bleeding.   Parapneumonic effusion Patient has continued shortness of breath. Decreased breath sound left lower lobe with mild rhonchi.  - Keep follow up with pulmonology next week.   Disposition: Refer to EP.          Signed, Lonell Rives. Carlen Rebuck, DNP, NP-C

## 2023-06-03 NOTE — Patient Instructions (Signed)
 Medication Instructions:  Your Physician recommend you continue on your current medication as directed.    *If you need a refill on your cardiac medications before your next appointment, please call your pharmacy*  Lab Work: None ordered at this time  If you have labs (blood work) drawn today and your tests are completely normal, you will receive your results only by: MyChart Message (if you have MyChart) OR A paper copy in the mail If you have any lab test that is abnormal or we need to change your treatment, we will call you to review the results.  Testing/Procedures: None ordered at this time   Follow-Up: At [facility], you and your health needs are our priority.  As part of our continuing mission to provide you with exceptional heart care, our providers are all part of one team.  This team includes your primary Cardiologist (physician) and Advanced Practice Providers or APPs (Physician Assistants and Nurse Practitioners) who all work together to provide you with the care you need, when you need it.  Your next appointment:     You are being referred to Electrophysiology.  We will try to get that scheduled today.  Provider:   Richardo Chandler, MD, Harvie Liner, MD, or Suzann Riddle, NP    We recommend signing up for the patient portal called "MyChart".  Sign up information is provided on this After Visit Summary.  MyChart is used to connect with patients for Virtual Visits (Telemedicine).  Patients are able to view lab/test results, encounter notes, upcoming appointments, etc.  Non-urgent messages can be sent to your provider as well.   To learn more about what you can do with MyChart, go to [email or web address].

## 2023-06-04 ENCOUNTER — Other Ambulatory Visit: Payer: Self-pay | Admitting: Internal Medicine

## 2023-06-04 LAB — URINE CULTURE
MICRO NUMBER:: 16489762
SPECIMEN QUALITY:: ADEQUATE

## 2023-06-04 MED ORDER — AMOXICILLIN 500 MG PO TABS
500.0000 mg | ORAL_TABLET | Freq: Three times a day (TID) | ORAL | 0 refills | Status: DC
Start: 2023-06-04 — End: 2023-06-07

## 2023-06-06 DIAGNOSIS — M6281 Muscle weakness (generalized): Secondary | ICD-10-CM | POA: Diagnosis not present

## 2023-06-06 DIAGNOSIS — Z9181 History of falling: Secondary | ICD-10-CM | POA: Diagnosis not present

## 2023-06-06 DIAGNOSIS — I480 Paroxysmal atrial fibrillation: Secondary | ICD-10-CM | POA: Diagnosis not present

## 2023-06-06 DIAGNOSIS — R2681 Unsteadiness on feet: Secondary | ICD-10-CM | POA: Diagnosis not present

## 2023-06-06 DIAGNOSIS — R2689 Other abnormalities of gait and mobility: Secondary | ICD-10-CM | POA: Diagnosis not present

## 2023-06-06 DIAGNOSIS — J189 Pneumonia, unspecified organism: Secondary | ICD-10-CM | POA: Diagnosis not present

## 2023-06-07 ENCOUNTER — Telehealth: Payer: Self-pay

## 2023-06-07 ENCOUNTER — Ambulatory Visit
Admission: RE | Admit: 2023-06-07 | Discharge: 2023-06-07 | Disposition: A | Discharge status: Home / Self Care | Source: Ambulatory Visit | Attending: Pulmonary Disease | Admitting: Pulmonary Disease

## 2023-06-07 ENCOUNTER — Other Ambulatory Visit: Payer: Self-pay | Admitting: Radiology

## 2023-06-07 ENCOUNTER — Encounter: Payer: Self-pay | Admitting: Pulmonary Disease

## 2023-06-07 ENCOUNTER — Other Ambulatory Visit: Payer: Self-pay | Admitting: Internal Medicine

## 2023-06-07 ENCOUNTER — Ambulatory Visit: Admitting: Pulmonary Disease

## 2023-06-07 ENCOUNTER — Other Ambulatory Visit: Payer: Self-pay | Admitting: Pulmonary Disease

## 2023-06-07 ENCOUNTER — Ambulatory Visit
Admission: RE | Admit: 2023-06-07 | Discharge: 2023-06-07 | Disposition: A | Discharge status: Home / Self Care | Source: Ambulatory Visit | Attending: Radiology | Admitting: Radiology

## 2023-06-07 VITALS — BP 120/70 | HR 64 | Temp 97.6°F | Ht 59.0 in | Wt 108.0 lb

## 2023-06-07 DIAGNOSIS — R0602 Shortness of breath: Secondary | ICD-10-CM

## 2023-06-07 DIAGNOSIS — Z48813 Encounter for surgical aftercare following surgery on the respiratory system: Secondary | ICD-10-CM | POA: Diagnosis not present

## 2023-06-07 DIAGNOSIS — J90 Pleural effusion, not elsewhere classified: Secondary | ICD-10-CM

## 2023-06-07 DIAGNOSIS — I340 Nonrheumatic mitral (valve) insufficiency: Secondary | ICD-10-CM | POA: Diagnosis not present

## 2023-06-07 DIAGNOSIS — I700 Atherosclerosis of aorta: Secondary | ICD-10-CM | POA: Diagnosis not present

## 2023-06-07 DIAGNOSIS — N390 Urinary tract infection, site not specified: Secondary | ICD-10-CM

## 2023-06-07 DIAGNOSIS — D500 Iron deficiency anemia secondary to blood loss (chronic): Secondary | ICD-10-CM | POA: Diagnosis not present

## 2023-06-07 DIAGNOSIS — J948 Other specified pleural conditions: Secondary | ICD-10-CM | POA: Diagnosis not present

## 2023-06-07 DIAGNOSIS — Z9889 Other specified postprocedural states: Secondary | ICD-10-CM

## 2023-06-07 DIAGNOSIS — I4819 Other persistent atrial fibrillation: Secondary | ICD-10-CM

## 2023-06-07 LAB — AMYLASE, PLEURAL OR PERITONEAL FLUID: Amylase, Fluid: 27 U/L

## 2023-06-07 LAB — PROTEIN, PLEURAL OR PERITONEAL FLUID: Total protein, fluid: <3 g/dL

## 2023-06-07 LAB — BODY FLUID CELL COUNT WITH DIFFERENTIAL
Eos, Fluid: 0 %
Lymphs, Fluid: 89 %
Monocyte-Macrophage-Serous Fluid: 7 %
Neutrophil Count, Fluid: 4 %
Total Nucleated Cell Count, Fluid: 331 uL

## 2023-06-07 LAB — ALBUMIN, PLEURAL OR PERITONEAL FLUID: Albumin, Fluid: <1.5 g/dL

## 2023-06-07 LAB — LACTATE DEHYDROGENASE, PLEURAL OR PERITONEAL FLUID: LD, Fluid: 70 U/L — ABNORMAL HIGH (ref 3–23)

## 2023-06-07 LAB — GLUCOSE, PLEURAL OR PERITONEAL FLUID: Glucose, Fluid: 111 mg/dL

## 2023-06-07 MED ORDER — LIDOCAINE HCL (PF) 1 % IJ SOLN
6.0000 mL | Freq: Once | INTRAMUSCULAR | Status: AC
Start: 2023-06-07 — End: 2023-06-07
  Administered 2023-06-07: 6 mL via INTRADERMAL
  Filled 2023-06-07: qty 6

## 2023-06-07 MED ORDER — FUROSEMIDE 20 MG PO TABS: 20.0000 mg | ORAL_TABLET | ORAL | 0 refills | Status: DC

## 2023-06-07 NOTE — Procedures (Signed)
 PROCEDURE SUMMARY:  Successful US  guided left thoracentesis. Yielded of amber-colored fluid. Pt tolerated procedure well. No immediate complications.  Specimen sent for labs. CXR ordered; no post-procedure pneumothorax identified.   EBL < 2 mL  Fawn Hooks, NP 06/07/2023 3:04 PM

## 2023-06-07 NOTE — Telephone Encounter (Signed)
 Spoke with pt's son and informed him of the message below and the medication that has been sent in. Son wanted to let you know that when they saw Dr. Viva Grise today they sent her over to [facility] to have her lung aspirated.

## 2023-06-07 NOTE — Telephone Encounter (Signed)
-----   Message from Thersia Flax sent at 06/07/2023  3:52 PM EDT ----- Regarding: NEW RX I  HAVE DISCUSSED HER CONDITION WITH DR Viva Grise, WHO SHE SAW TODAY. AND WE AGREE SHE HAS A TOUCH OF HEART FAILURE.  I  AM SENDING IN A FLUID PILL CALLED FUROSEMIDE TO TAKE EVERY OTHER DAY IN THE MORNING  IT WILL MAKE HER URINATE A LOT SO SHE SHOULD PLAN HER ACTIVITIES OUT OF THE HOME ON THE DAYS SHE IS NOT TAKING IT.

## 2023-06-07 NOTE — Telephone Encounter (Signed)
 Pharmacy comment: Script Clarification:2 SETS OF DIRECTIONS PLEASE RESEND WITH ONLY 1 SET.

## 2023-06-07 NOTE — Progress Notes (Signed)
 Subjective:    Patient ID: Gloria Carr, female    DOB: Jan 14, 1927, 89 y.o.   MRN: [id number]  Patient Care Team: Thersia Flax, MD as PCP - General (Internal Medicine) Devorah Fonder, MD as PCP - Cardiology (Cardiology)  Chief Complaint  Patient presents with   Consult    Shortness of breath on exertion and at rest. Occasional cough and wheezing.     BACKGROUND: Patient is a 89 year old remote former smoker who presents for evaluation of shortness of breath and pleural effusion on the left.  Patient recently admitted to [facility] for pneumonia, atrial fibrillation and subsequent GI bleed after initiation of anticoagulation.  During admission she was noted to have a left lower lobe pneumonia with small parapneumonic effusion.  Patient presents with ongoing issues with shortness of breath and fatigue.  Continues to have issues with atrial fibrillation, anemia due to GI blood loss and recently noted moderate mitral regurgitation.  Patient is kindly referred by Dr. Creta Dolin.   HPI Discussed the use of AI scribe software for clinical note transcription with the patient, who gave verbal consent to proceed.  History of Present Illness   Gloria Carr is a 89 year old female with atrial fibrillation and pneumonia who presents with shortness of breath. She is accompanied by her son, [name]. She was referred by Dr. Tullo for further evaluation of her pulmonary issues.  On May 10th, she went to the hospital due to shortness of breath. Imaging showed shadowing consistent with pneumonia, but she was not admitted and was placed under observation. During this time, atrial fibrillation was also noted, and she was started on a blood thinner, which led to a bleeding event. She underwent a colonoscopy which showed diverticulitis but no significant findings otherwise.  She experiences ongoing shortness of breath, particularly when walking short distances, such as 50 feet, which causes her to 'huff and puff.'  She is currently receiving physical and occupational therapy three times a week at [facility].  She was initially treated with a short course of antibiotics,and is now on amoxicillin  500 mg three times a day for a urinary tract infection due to Enterococcus, diagnosed recently.   She has experienced aches in her upper back and shoulder, primarily on the right side, which occasionally travels to the left side. These symptoms are not consistently present and may be related to her musculoskeletal condition.  She has a history of anemia, attributed to a previous bleeding event, with her blood count recently improving to 9.1.  During her GI bleed she dropped to 8.4 and 26.4.  Her iron levels have been very low, she is currently on p.o. iron supplementation.  Echocardiogram has recently noted her to have moderate to severe mitral regurgitation and mild pulmonary hypertension. She is on metoprolol  for rate control of her atrial fibrillation.     DATA 05/21/2023 CT chest without contrast: Left lower lobe pneumonia with suspected parapneumonic effusion. 05/22/2023 echocardiogram: LVEF 60 to 65% indeterminate left ventricular diastolic parameters.  Mildly elevated pulmonary artery systolic pressure.  Moderate to severe mitral valve regurgitation.  Moderate tricuspid valve regurgitation. 06/01/2023 chest x-ray PA and lateral: Blunting of the right costophrenic angle, small effusion noted on the left, independently reviewed.  Review of Systems A 10 point review of systems was performed and it is as noted above otherwise negative.   Past Medical History:  Diagnosis Date   Allergy    Arthritis    Closed fracture of left superior pubic ramus, with routine  healing, subsequent encounter 08/24/2016   E. coli UTI 04/08/2022   Glaucoma    Glaucoma    History of chickenpox    History of recurrent UTIs    Hypertension    Macular degeneration    Spinal stenosis    Thyroid  disease     Past Surgical  History:  Procedure Laterality Date   BILATERAL CARPAL TUNNEL RELEASE  1999-2000   CATARACT EXTRACTION, BILATERAL  2013   COLONOSCOPY N/A 05/25/2023   Procedure: COLONOSCOPY;  Surgeon: [name], MD;  Location: [facility] ENDOSCOPY;  Service: Endoscopy;  Laterality: N/A;    Patient Active Problem List   Diagnosis Date Noted   Iron deficiency anemia due to chronic blood loss 06/02/2023   Hospital discharge follow-up 06/02/2023   Rectal bleeding 05/23/2023   Pleural effusion 05/22/2023   Shortness of breath 05/22/2023   GI bleed 05/22/2023   Community acquired pneumonia of left lower lobe of lung 05/21/2023   Atrial fibrillation (HCC) 05/21/2023   Fear of falling 01/10/2023   Counseling regarding advance care planning and goals of care 07/09/2022   COVID-19 virus infection 04/05/2022   History of fall within past 90 days 03/26/2022   Peripheral vascular disease (HCC) 03/26/2022   Constipation 01/02/2022   Hiatal hernia 10/10/2021   Pancreatic duct dilated 10/10/2021   Abdominal aortic atherosclerosis (HCC) 10/09/2021   Leg swelling 09/16/2021   Hearing loss of right ear due to cerumen impaction 07/01/2021   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, unspecified stage (HCC) 01/16/2021   Macular degeneration, bilateral 07/19/2020   Nocturia 04/10/2019   Sciatica 12/21/2018   Post-menopausal atrophic vaginitis 08/09/2018   Prediabetes 01/29/2018   Degenerative cervical spinal stenosis 12/10/2017   Low back pain 04/14/2017   Lumbar spinal stenosis 12/15/2016   Essential hypertension 11/27/2016   Pure hypercholesterolemia 04/15/2016   Vitamin D  deficiency 04/15/2016   Osteoporosis 12/25/2015   Visit for preventive health examination 01/19/2015   Acquired hypothyroidism 07/16/2014   Hereditary and idiopathic peripheral neuropathy 07/16/2014    Family History  Problem Relation Age of Onset   Stroke Mother    Cancer Mother 55       colon   Cancer Father        bone    Cancer Son        Jaw   Stroke Sister    Breast cancer Neg Hx     Social History   Tobacco Use   Smoking status: Former   Smokeless tobacco: Never  Substance Use Topics   Alcohol use: Yes    Alcohol/week: 5.0 standard drinks of alcohol    Types: 5 Standard drinks or equivalent per week    Comment: with dinner    Allergies  Allergen Reactions   Sulfa Antibiotics Other (See Comments)    Childhood reaction     Current Meds  Medication Sig   amoxicillin  (AMOXIL ) 500 MG tablet Take 1 tablet (500 mg total) by mouth in the morning, at noon, and at bedtime for 5 days.   atorvastatin  (LIPITOR) 20 MG tablet Take 1 tablet (20 mg total) by mouth daily.   Cholecalciferol (VITAMIN D3) 50 MCG (2000 UT) capsule Take 2,000 Units by mouth daily.   denosumab  (PROLIA ) 60 MG/ML SOSY injection Inject 60 mg into the skin every 6 (six) months.   diclofenac Sodium (VOLTAREN) 1 % GEL Apply 2 g topically as needed (pain).   ferrous sulfate 325 (65 FE) MG tablet Take 1 tablet (325 mg total) by mouth daily with  breakfast.   latanoprost (XALATAN) 0.005 % ophthalmic solution Place 1 drop into the left eye at bedtime.   levothyroxine  (SYNTHROID ) 50 MCG tablet TAKE 1 TABLET EVERY DAY ON EMPTY STOMACHWITH A GLASS OF WATER AT LEAST 30-60 MINBEFORE BREAKFAST   metoprolol  tartrate (LOPRESSOR ) 25 MG tablet Take 0.5 tablets (12.5 mg total) by mouth 2 (two) times daily.   Multiple Vitamins-Minerals (PRESERVISION AREDS 2 PO) Take by mouth.   pantoprazole  (PROTONIX ) 40 MG tablet Take 1 tablet (40 mg total) by mouth daily.   Propylene Glycol (SYSTANE BALANCE OP) Apply to eye.   traMADol  (ULTRAM ) 50 MG tablet Take 50 mg by mouth 3 (three) times daily as needed.    Immunization History  Administered Date(s) Administered   Fluad Quad(high Dose 65+) 10/17/2019   Influenza, High Dose Seasonal PF 10/16/2015, 10/31/2017   Influenza-Unspecified 09/12/2014, 09/27/2016, 11/08/2018, 10/25/2020, 12/10/2021, 11/08/2022    PFIZER Comirnaty(Gray Top)Covid-19 Tri-Sucrose Vaccine 01/17/2019, 02/14/2019, 10/18/2019   PFIZER(Purple Top)SARS-COV-2 Vaccination 01/17/2019, 02/14/2019, 10/18/2019, 05/28/2020   Pfizer(Comirnaty)Fall Seasonal Vaccine 12 years and older 12/10/2021   Pneumococcal Conjugate-13 01/17/2015   Pneumococcal Polysaccharide-23 01/17/2011   Td 12/11/2015   Tdap 12/11/2015, 12/11/2015   Zoster Recombinant(Shingrix) 07/16/2020, 11/17/2020   Zoster, Live 07/15/2012        Objective:     BP 120/70 (BP Location: Left Arm, Patient Position: Sitting, Cuff Size: Normal)   Pulse 64   Temp 97.6 F (36.4 C) (Temporal)   Ht 4\' 11"  (1.499 m)   Wt 108 lb (49 kg)   LMP  (LMP Unknown)   SpO2 97%   BMI 21.81 kg/m   SpO2: 97 %  GENERAL: Pale, frail-appearing elderly woman in no respiratory distress.  She presents in transport chair. HEAD: Normocephalic, atraumatic.  EYES: Pupils equal, round, reactive to light.  No scleral icterus.  MOUTH: Some missing teeth, partial lower, upper dentures.  Oral mucosa moist.  No thrush. NECK: Supple. No thyromegaly. Trachea midline. No JVD.  No adenopathy. PULMONARY: Good air entry bilaterally.  Mild decreased breath sounds on the left base.  No adventitious sounds otherwise. CARDIOVASCULAR: S1 and S2. Regular rate and rhythm.  ABDOMEN: Benign. MUSCULOSKELETAL: No joint deformity, no clubbing, no edema.  NEUROLOGIC: No overt focal deficit.  Unsteady gait assisted with walker. SKIN: Intact,warm,dry. PSYCH: Mood and behavior normal.  Ambulatory oxymetry was performed today:  At rest on room air oxygen saturation was 99%, the patient ambulated at a slow pace, completed 1 laps, O2 nadir 99%, mild shortness of breath, fatigue severe.  Resting heart rate was 75 bpm at maximum for this exercise 75 bpm.   Point-of-care ultrasound was used to evaluate: Pleural effusion on LEFT. Small pleural effusion noted on LEFT base (around 400 to 500 mL).  Unable to save  image.  Assessment & Plan:     ICD-10-CM   1. Pleural effusion on left  J90 US  CHEST (PLEURAL EFFUSION)    Overnight Pulse Oximetry Study    2. Persistent atrial fibrillation (HCC)  I48.19 Overnight Pulse Oximetry Study    3. Mitral valve insufficiency, unspecified etiology  I34.0     4. Iron deficiency anemia due to chronic blood loss  D50.0     5. Urinary tract infection without hematuria, site unspecified  N39.0       Orders Placed This Encounter  Procedures   US  CHEST (PLEURAL EFFUSION)    Reason for Exam (SYMPTOM  OR DIAGNOSIS REQUIRED):   Pleural effusion    Preferred imaging location?:   Internal  Overnight Pulse Oximetry Study    Standing Status:   Future    Expiration Date:   06/06/2024    Scheduling Instructions:     On room air.   Discussion:    Atrial fibrillation Atrial fibrillation with recent onset, likely related to mitral valve disease. Rate is well controlled with metoprolol . Shortness of breath may be related to AFib and mitral regurgitation. AFib can cause inefficiency in heart function due to loss of atrial kick, contributing to symptoms. - Continue metoprolol  for rate control. - Monitor oxygen levels during ambulation and at night.  Mitral valve regurgitation Chronic mitral valve regurgitation contributing to shortness of breath, likely exacerbated by anemia and AFib. Mitral valve disease could be a contributing factor to AFib.  Pneumonia Pneumonia diagnosed on May 10th with shadowing on chest x-ray. Currently on amoxicillin  for UTI, which may also help with pneumonia.  Left pleural effusion likely due to pneumonia (parapneumonic effusion) and possibly heart failure.  Small amount of fluid noted, patient has IR order for thoracentesis, will have her proceed. - Continue amoxicillin  500 mg three times a day. - Proceed with thoracentesis per IR as scheduled. - Ensure follow-up.  Anemia due to bleeding event Anemia secondary to a bleeding event caused  by anticoagulation for AFib. Hemoglobin levels improving but still low, contributing to shortness of breath. Improvement in hemoglobin levels expected to alleviate some symptoms. - Continue iron supplementation to improve hemoglobin levels.  Urinary tract infection Recent UTI diagnosed and being treated with amoxicillin . Important to treat due to risk of bacterial seeding in the context of valvular disease. - Continue amoxicillin  500 mg three times a day.    See the patient in follow-up in 3 to 4 weeks time she is to contact us  prior to that time should any new difficulties arise.  Advised if symptoms do not improve or worsen, to please contact office for sooner follow up or seek emergency care.    I spent 65 minutes of dedicated to the care of this patient on the date of this encounter to include pre-visit review of records, face-to-face time with the patient discussing conditions above, post visit ordering of testing, clinical documentation with the electronic health record, making appropriate referrals as documented, and communicating necessary findings to members of the patients care team.   C. Chloe Counter, MD Advanced Bronchoscopy PCCM [facility]    *This note was dictated using voice recognition software/Dragon.  Despite best efforts to proofread, errors can occur which can change the meaning. Any transcriptional errors that result from this process are unintentional and may not be fully corrected at the time of dictation.

## 2023-06-07 NOTE — Patient Instructions (Signed)
 VISIT SUMMARY:  Today, we discussed your ongoing health issues, including atrial fibrillation, mitral valve regurgitation, pneumonia, anemia, and a recent urinary tract infection. We reviewed your current medications and treatment plans to ensure you are on the right track for recovery.  YOUR PLAN:  -ATRIAL FIBRILLATION: Atrial fibrillation is an irregular and often rapid heart rate that can lead to poor blood flow. Your heart rate is well controlled with metoprolol . We will continue this medication and monitor your oxygen levels during activities and at night.  -MITRAL VALVE REGURGITATION: Mitral valve regurgitation is a condition where the heart's mitral valve doesn't close tightly, allowing blood to flow backward in the heart. This can contribute to your shortness of breath. We will continue to monitor this condition.  -PNEUMONIA: Pneumonia is an infection that inflames the air sacs in one or both lungs. You are currently on amoxicillin  for a urinary tract infection, which may also help with the pneumonia. We will monitor the fluid around your lungs and ensure follow-up if a procedure called thoracentesis is needed.  -ANEMIA DUE TO BLEEDING EVENT: Anemia is a condition where you don't have enough healthy red blood cells to carry adequate oxygen to your body's tissues. Your anemia is improving, and we will continue iron supplementation to help increase your hemoglobin levels.  -URINARY TRACT INFECTION: A urinary tract infection is an infection in any part of your urinary system. You are being treated with amoxicillin , and it is important to continue this medication to prevent any complications.  INSTRUCTIONS:  Please continue taking your medications as prescribed.  We will monitor your oxygen levels at night. Follow up with any scheduled appointments, including checking if a thoracentesis is needed. Continue with your physical and occupational therapy sessions. If you experience any worsening  symptoms or new issues, please contact our office immediately.  We will see you in follow-up in 3 to 4 weeks time call sooner should any new problems arise.

## 2023-06-07 NOTE — Telephone Encounter (Signed)
 Gloria Flax, MD  [name], CMA I  HAVE DISCUSSED HER CONDITION WITH DR Viva Grise, WHO SHE SAW TODAY. AND WE AGREE SHE HAS A TOUCH OF HEART FAILURE.  I  AM SENDING IN A FLUID PILL CALLED FUROSEMIDE TO TAKE EVERY OTHER DAY IN THE MORNING  IT WILL MAKE HER URINATE A LOT SO SHE SHOULD PLAN HER ACTIVITIES OUT OF THE HOME ON THE DAYS SHE IS NOT TAKING IT.

## 2023-06-08 DIAGNOSIS — R2681 Unsteadiness on feet: Secondary | ICD-10-CM | POA: Diagnosis not present

## 2023-06-08 DIAGNOSIS — R296 Repeated falls: Secondary | ICD-10-CM | POA: Diagnosis not present

## 2023-06-08 DIAGNOSIS — M5459 Other low back pain: Secondary | ICD-10-CM | POA: Diagnosis not present

## 2023-06-08 LAB — TRIGLYCERIDES, BODY FLUIDS: Triglycerides, Fluid: <9 mg/dL

## 2023-06-09 ENCOUNTER — Ambulatory Visit: Payer: Self-pay | Admitting: Pulmonary Disease

## 2023-06-09 DIAGNOSIS — R2681 Unsteadiness on feet: Secondary | ICD-10-CM | POA: Diagnosis not present

## 2023-06-09 DIAGNOSIS — M5459 Other low back pain: Secondary | ICD-10-CM | POA: Diagnosis not present

## 2023-06-09 DIAGNOSIS — M6281 Muscle weakness (generalized): Secondary | ICD-10-CM | POA: Diagnosis not present

## 2023-06-09 DIAGNOSIS — J189 Pneumonia, unspecified organism: Secondary | ICD-10-CM | POA: Diagnosis not present

## 2023-06-09 DIAGNOSIS — R296 Repeated falls: Secondary | ICD-10-CM | POA: Diagnosis not present

## 2023-06-09 DIAGNOSIS — Z9181 History of falling: Secondary | ICD-10-CM | POA: Diagnosis not present

## 2023-06-09 DIAGNOSIS — R2689 Other abnormalities of gait and mobility: Secondary | ICD-10-CM | POA: Diagnosis not present

## 2023-06-09 DIAGNOSIS — I480 Paroxysmal atrial fibrillation: Secondary | ICD-10-CM | POA: Diagnosis not present

## 2023-06-09 LAB — CYTOLOGY - NON PAP

## 2023-06-09 LAB — ACID FAST SMEAR (AFB, MYCOBACTERIA): Acid Fast Smear: NEGATIVE

## 2023-06-10 DIAGNOSIS — R2681 Unsteadiness on feet: Secondary | ICD-10-CM | POA: Diagnosis not present

## 2023-06-10 DIAGNOSIS — R296 Repeated falls: Secondary | ICD-10-CM | POA: Diagnosis not present

## 2023-06-10 DIAGNOSIS — M5459 Other low back pain: Secondary | ICD-10-CM | POA: Diagnosis not present

## 2023-06-10 LAB — CHOLESTEROL, BODY FLUID: Cholesterol, Fluid: 17 mg/dL

## 2023-06-11 LAB — BODY FLUID CULTURE W GRAM STAIN: Culture: NO GROWTH

## 2023-06-13 DIAGNOSIS — M5459 Other low back pain: Secondary | ICD-10-CM | POA: Diagnosis not present

## 2023-06-13 DIAGNOSIS — R296 Repeated falls: Secondary | ICD-10-CM | POA: Diagnosis not present

## 2023-06-13 DIAGNOSIS — R2681 Unsteadiness on feet: Secondary | ICD-10-CM | POA: Diagnosis not present

## 2023-06-13 NOTE — Progress Notes (Signed)
 Spoke with Pt and her daughter, relayed messaged and nothing further needed.

## 2023-06-14 DIAGNOSIS — J189 Pneumonia, unspecified organism: Secondary | ICD-10-CM | POA: Diagnosis not present

## 2023-06-14 DIAGNOSIS — M6281 Muscle weakness (generalized): Secondary | ICD-10-CM | POA: Diagnosis not present

## 2023-06-14 DIAGNOSIS — R2689 Other abnormalities of gait and mobility: Secondary | ICD-10-CM | POA: Diagnosis not present

## 2023-06-14 DIAGNOSIS — Z9181 History of falling: Secondary | ICD-10-CM | POA: Diagnosis not present

## 2023-06-14 DIAGNOSIS — R2681 Unsteadiness on feet: Secondary | ICD-10-CM | POA: Diagnosis not present

## 2023-06-14 DIAGNOSIS — I480 Paroxysmal atrial fibrillation: Secondary | ICD-10-CM | POA: Diagnosis not present

## 2023-06-15 DIAGNOSIS — M5459 Other low back pain: Secondary | ICD-10-CM | POA: Diagnosis not present

## 2023-06-15 DIAGNOSIS — R2681 Unsteadiness on feet: Secondary | ICD-10-CM | POA: Diagnosis not present

## 2023-06-15 DIAGNOSIS — R296 Repeated falls: Secondary | ICD-10-CM | POA: Diagnosis not present

## 2023-06-16 DIAGNOSIS — J189 Pneumonia, unspecified organism: Secondary | ICD-10-CM | POA: Diagnosis not present

## 2023-06-16 DIAGNOSIS — Z9181 History of falling: Secondary | ICD-10-CM | POA: Diagnosis not present

## 2023-06-16 DIAGNOSIS — R2689 Other abnormalities of gait and mobility: Secondary | ICD-10-CM | POA: Diagnosis not present

## 2023-06-16 DIAGNOSIS — R0902 Hypoxemia: Secondary | ICD-10-CM | POA: Diagnosis not present

## 2023-06-16 DIAGNOSIS — M6281 Muscle weakness (generalized): Secondary | ICD-10-CM | POA: Diagnosis not present

## 2023-06-16 DIAGNOSIS — G4730 Sleep apnea, unspecified: Secondary | ICD-10-CM | POA: Diagnosis not present

## 2023-06-16 DIAGNOSIS — I480 Paroxysmal atrial fibrillation: Secondary | ICD-10-CM | POA: Diagnosis not present

## 2023-06-16 DIAGNOSIS — R2681 Unsteadiness on feet: Secondary | ICD-10-CM | POA: Diagnosis not present

## 2023-06-17 DIAGNOSIS — R296 Repeated falls: Secondary | ICD-10-CM | POA: Diagnosis not present

## 2023-06-17 DIAGNOSIS — R2681 Unsteadiness on feet: Secondary | ICD-10-CM | POA: Diagnosis not present

## 2023-06-17 DIAGNOSIS — M5459 Other low back pain: Secondary | ICD-10-CM | POA: Diagnosis not present

## 2023-06-20 DIAGNOSIS — R296 Repeated falls: Secondary | ICD-10-CM | POA: Diagnosis not present

## 2023-06-20 DIAGNOSIS — M5459 Other low back pain: Secondary | ICD-10-CM | POA: Diagnosis not present

## 2023-06-20 DIAGNOSIS — R2681 Unsteadiness on feet: Secondary | ICD-10-CM | POA: Diagnosis not present

## 2023-06-21 DIAGNOSIS — R2681 Unsteadiness on feet: Secondary | ICD-10-CM | POA: Diagnosis not present

## 2023-06-21 DIAGNOSIS — J189 Pneumonia, unspecified organism: Secondary | ICD-10-CM | POA: Diagnosis not present

## 2023-06-21 DIAGNOSIS — M6281 Muscle weakness (generalized): Secondary | ICD-10-CM | POA: Diagnosis not present

## 2023-06-21 DIAGNOSIS — R2689 Other abnormalities of gait and mobility: Secondary | ICD-10-CM | POA: Diagnosis not present

## 2023-06-21 DIAGNOSIS — I480 Paroxysmal atrial fibrillation: Secondary | ICD-10-CM | POA: Diagnosis not present

## 2023-06-21 DIAGNOSIS — Z9181 History of falling: Secondary | ICD-10-CM | POA: Diagnosis not present

## 2023-06-21 NOTE — Progress Notes (Unsigned)
  Electrophysiology Office Note:    Date:  06/22/2023   ID:  Gloria Carr, DOB 1927/02/16, MRN [id number]  [facility] Cardiologist:  Belva Boyden, MD  [facility] Electrophysiologist:  Boyce Byes, MD   Referring MD: Morey Ar, NP   Chief Complaint: AF  History of Present Illness:    Gloria Carr is a 89yo woman who I am sseeing today for an evaluation of AF at the request of Morey Ar. The patient has a history of AF dx May 2025, HTN, HLD, Hypothyroidism.   She was started on OAC but this was complicated by GI bleeding. She was asymptomatic with her AF. She had a colonoscopy which showed diverticulosis.   She is being referred to discuss Watchman implant.  She is with her son and daughter today in clinic.  She feels ongoing fatigue.  Her son thinks that the fatigue has improved since leaving the hospital.  Her swelling in her legs has improved.  The patient and her family expressed a desire to simplify her medication regimen if possible     Their past medical, social and family history was reviewed.   ROS:   Please see the history of present illness.    All other systems reviewed and are negative.  EKGs/Labs/Other Studies Reviewed:    The following studies were reviewed today:  05/22/2023 Echo EF 60 RV normal Mod-severe MR Moderate TR  06/03/2023 ECG shows AF with low voltage. V rate 68 bpm.       Physical Exam:    VS:  BP 118/60 (BP Location: Left Arm)   Pulse 62   Ht 4' 9 (1.448 m)   Wt 98 lb 3.2 oz (44.5 kg)   LMP  (LMP Unknown)   SpO2 95%   BMI 21.25 kg/m     Wt Readings from Last 3 Encounters:  06/22/23 98 lb 3.2 oz (44.5 kg)  06/07/23 108 lb (49 kg)  06/03/23 108 lb 12.8 oz (49.4 kg)     GEN: no distress.  Elderly CARD: RRR, No MRG RESP: No IWOB. CTAB.        ASSESSMENT AND PLAN:    1. Persistent atrial fibrillation (HCC)   2. History of GI bleed     #Persistent AF Asymptomatic. Not on OAC due to GI  bleeding. I discussed her stroke risk during today's clinic appointment. We discussed anticoagulation and LAAO. Given her advanced age, frailty and comorbid medical conditions, I do not think she is a candidate for invasive EP procedures.  She will stop taking metoprolol  tartrate.  I will transition this to metoprolol  succinate 25 mg by mouth once daily at night.  #History of GI bleeding I am going to update a CBC today to make sure her hemoglobin is trending up Continue iron  She will stop taking atorvastatin .  Follow up with EP on an as needed basis.        Signed, Leanora Prophet. Marven Slimmer, MD, [facility], [facility] 06/22/2023 12:03 PM    Electrophysiology [facility]

## 2023-06-22 ENCOUNTER — Telehealth: Payer: Self-pay

## 2023-06-22 ENCOUNTER — Other Ambulatory Visit: Payer: Self-pay

## 2023-06-22 ENCOUNTER — Ambulatory Visit: Attending: Cardiology | Admitting: Cardiology

## 2023-06-22 VITALS — BP 118/60 | HR 62 | Ht <= 58 in | Wt 98.2 lb

## 2023-06-22 DIAGNOSIS — M5459 Other low back pain: Secondary | ICD-10-CM | POA: Diagnosis not present

## 2023-06-22 DIAGNOSIS — R296 Repeated falls: Secondary | ICD-10-CM | POA: Diagnosis not present

## 2023-06-22 DIAGNOSIS — I4819 Other persistent atrial fibrillation: Secondary | ICD-10-CM | POA: Diagnosis not present

## 2023-06-22 DIAGNOSIS — R2681 Unsteadiness on feet: Secondary | ICD-10-CM | POA: Diagnosis not present

## 2023-06-22 DIAGNOSIS — Z8719 Personal history of other diseases of the digestive system: Secondary | ICD-10-CM

## 2023-06-22 MED ORDER — METOPROLOL SUCCINATE ER 25 MG PO TB24: 25.0000 mg | ORAL_TABLET | Freq: Every day | ORAL | 3 refills | Status: AC

## 2023-06-22 NOTE — Patient Instructions (Addendum)
 Medication Instructions:  Your physician has recommended you make the following change in your medication:  1) STOP taking Lipitor (atorvastatin ) 2) STOP taking Lopressor  (metoprolol  tartrate)  3) START taking Toprol  XL (metoprolol  succinate) 25 mg once every night  *If you need a refill on your cardiac medications before your next appointment, please call your pharmacy*  Lab Work: TODAY: CBC  Follow-Up: At [facility], you and your health needs are our priority.  As part of our continuing mission to provide you with exceptional heart care, our providers are all part of one team.  This team includes your primary Cardiologist (physician) and Advanced Practice Providers or APPs (Physician Assistants and Nurse Practitioners) who all work together to provide you with the care you need, when you need it.  Your next appointment:   As needed with EP

## 2023-06-22 NOTE — Telephone Encounter (Signed)
 Overnight Pulse-Oximetry Report  Testing started on 06/16/2023 testing stopped 06/17/2023.  Dr. Viva Grise reviewed and recommends 2L oxygen at night.   NA. LMTCB  E2C2 please advise patient of test result.

## 2023-06-22 NOTE — Addendum Note (Signed)
 Addended by: CHAUVIGNE, Elmire Amrein on: 06/22/2023 12:05 PM   Modules accepted: Orders

## 2023-06-22 NOTE — Telephone Encounter (Signed)
 Copied from CRM [id number]. Topic: Clinical - Lab/Test Results >> Jun 22, 2023  2:43 PM Jethro Morrison wrote: Reason for CRM: PT CALLED BACK GET DUE TO THE MISSED CALLED PER NOTES GAVE HER RESULTS ON WHAT PROVIDER SUGGESTED THEY WOULD LIKE A CALL BACK WANTING TO KNOW WHAT THE STATS WHERE FROM THE REPORT. THANKS

## 2023-06-22 NOTE — Telephone Encounter (Signed)
 Advised Gloria Carr of ONO test results. Patient advised that oxygen dropped to 72 252 oxygen desat events. Patient requested to go over result next week. NFN.

## 2023-06-23 DIAGNOSIS — M6281 Muscle weakness (generalized): Secondary | ICD-10-CM | POA: Diagnosis not present

## 2023-06-23 DIAGNOSIS — I480 Paroxysmal atrial fibrillation: Secondary | ICD-10-CM | POA: Diagnosis not present

## 2023-06-23 DIAGNOSIS — J189 Pneumonia, unspecified organism: Secondary | ICD-10-CM | POA: Diagnosis not present

## 2023-06-23 DIAGNOSIS — Z9181 History of falling: Secondary | ICD-10-CM | POA: Diagnosis not present

## 2023-06-23 DIAGNOSIS — R2689 Other abnormalities of gait and mobility: Secondary | ICD-10-CM | POA: Diagnosis not present

## 2023-06-23 DIAGNOSIS — R2681 Unsteadiness on feet: Secondary | ICD-10-CM | POA: Diagnosis not present

## 2023-06-23 LAB — CBC
Hematocrit: 35.2 % (ref 34.0–46.6)
Hemoglobin: 10.8 g/dL — ABNORMAL LOW (ref 11.1–15.9)
MCH: 28.2 pg (ref 26.6–33.0)
MCHC: 30.7 g/dL — ABNORMAL LOW (ref 31.5–35.7)
MCV: 92 fL (ref 79–97)
Platelets: 413 10*3/uL (ref 150–450)
RBC: 3.83 x10E6/uL (ref 3.77–5.28)
RDW: 13.1 % (ref 11.7–15.4)
WBC: 6.5 10*3/uL (ref 3.4–10.8)

## 2023-06-24 ENCOUNTER — Ambulatory Visit: Payer: Self-pay | Admitting: Cardiology

## 2023-06-24 DIAGNOSIS — R2681 Unsteadiness on feet: Secondary | ICD-10-CM | POA: Diagnosis not present

## 2023-06-24 DIAGNOSIS — R296 Repeated falls: Secondary | ICD-10-CM | POA: Diagnosis not present

## 2023-06-24 DIAGNOSIS — M5459 Other low back pain: Secondary | ICD-10-CM | POA: Diagnosis not present

## 2023-06-27 DIAGNOSIS — R296 Repeated falls: Secondary | ICD-10-CM | POA: Diagnosis not present

## 2023-06-27 DIAGNOSIS — M5459 Other low back pain: Secondary | ICD-10-CM | POA: Diagnosis not present

## 2023-06-27 DIAGNOSIS — R2681 Unsteadiness on feet: Secondary | ICD-10-CM | POA: Diagnosis not present

## 2023-06-28 ENCOUNTER — Encounter: Payer: Self-pay | Admitting: Pulmonary Disease

## 2023-06-28 ENCOUNTER — Ambulatory Visit: Admitting: Pulmonary Disease

## 2023-06-28 VITALS — BP 134/72 | HR 56 | Temp 98.1°F | Ht <= 58 in | Wt 96.0 lb

## 2023-06-28 DIAGNOSIS — D500 Iron deficiency anemia secondary to blood loss (chronic): Secondary | ICD-10-CM

## 2023-06-28 DIAGNOSIS — I272 Pulmonary hypertension, unspecified: Secondary | ICD-10-CM | POA: Diagnosis not present

## 2023-06-28 DIAGNOSIS — J90 Pleural effusion, not elsewhere classified: Secondary | ICD-10-CM

## 2023-06-28 DIAGNOSIS — R5383 Other fatigue: Secondary | ICD-10-CM

## 2023-06-28 DIAGNOSIS — I340 Nonrheumatic mitral (valve) insufficiency: Secondary | ICD-10-CM | POA: Diagnosis not present

## 2023-06-28 DIAGNOSIS — I4819 Other persistent atrial fibrillation: Secondary | ICD-10-CM | POA: Diagnosis not present

## 2023-06-28 NOTE — Patient Instructions (Addendum)
 VISIT SUMMARY:  Today, we discussed your ongoing symptoms of shortness of breath and fatigue, which are related to your heart conditions, including mitral valve regurgitation and pulmonary hypertension. We reviewed your current medications and therapies, and talked about ways to manage your symptoms and improve your quality of life.  YOUR PLAN:  -MITRAL VALVE REGURGITATION: Mitral valve regurgitation is a condition where the heart's mitral valve doesn't close tightly, allowing blood to flow backward in the heart. This can cause shortness of breath and fatigue. Continue taking your diuretics to help reduce fluid buildup, and try to stay active as much as you can, but rest when needed.  -PULMONARY HYPERTENSION: Pulmonary hypertension is high blood pressure in the arteries of your lungs, which can make it hard to breathe and lower your oxygen levels, especially at night. We discussed trying supplemental oxygen at night to help with your sleep and oxygen levels, but understand your concerns about comfort and long-term use.  You have declined supplemental oxygen at this juncture.  -SHORTNESS OF BREATH ON EXERTION: Your shortness of breath when you are active is due to your heart conditions. Try to gradually increase your physical activity as much as you can tolerate.  Continue your physical therapies as directed.  -PLEURAL EFFUSION: A pleural effusion is a buildup of fluid around the lungs, which can make it hard to breathe. Your current treatment with diuretics seems to be preventing any new fluid buildup, so continue with this medication.  -GOALS OF CARE: We talked about your wishes for end-of-life care, including your desire to avoid lifelong use of supplemental oxygen. We will continue to respect your preferences and discuss your goals and options as needed.  INSTRUCTIONS:  Please continue with your current medications and therapies, including your diuretics and physical and occupational therapy  sessions. If you decide to try the supplemental oxygen at night, let us  know so we can arrange it for you. Make sure to rest when you need to, and try to stay as active as you can.  Continue follow-up with Dr. Tullo, follow-up here as needed.

## 2023-06-28 NOTE — Progress Notes (Signed)
 Subjective:    Patient ID: Gloria Carr, female    DOB: 12/25/1927, 89 y.o.   MRN: [id number]  Patient Care Team: Thersia Flax, MD as PCP - General (Internal Medicine) Devorah Fonder, MD as PCP - Cardiology (Cardiology) Boyce Byes, MD as PCP - Electrophysiology (Cardiology)  Chief Complaint  Patient presents with   Follow-up    Shortness of breath on exertion.     BACKGROUND/INTERVAL:Patient is a 89 year old remote former smoker who presents for follow-up of shortness of breath and pleural effusion on the left. Patient was admitted to [facility] for pneumonia, atrial fibrillation and subsequent GI bleed after initiation of anticoagulation.  Admission was in May.  During admission she was noted to have a left lower lobe pneumonia with small parapneumonic effusion. Patient presents with ongoing issues with shortness of breath and fatigue. Continues to have issues with atrial fibrillation, anemia due to GI blood loss and recently noted moderate mitral regurgitation.  Initially saw the patient on 07 Jun 2023.  HPI Discussed the use of AI scribe software for clinical note transcription with the patient, who gave verbal consent to proceed.  History of Present Illness   Gloria Carr is a 89 year old female with mild pulmonary hypertension and moderate to severe mitral valve regurgitation who presents with shortness of breath on exertion. She is under the care of Dr. Tullo for her overall management.  She presents today with her son, Gloria Carr.  She experiences shortness of breath on exertion, limiting her ability to increase physical activity despite efforts to walk more. She feels tired and 'just doesn't feel right.' Her oxygen levels drop during sleep, and she is reluctant to use supplemental oxygen at night due to discomfort with the nasal cannula.  She has a history of mild pulmonary hypertension and moderate to severe mitral valve regurgitation, contributing to her symptoms of fatigue and  shortness of breath. She is currently taking a diuretic to manage fluid retention, which has helped reduce swelling in her ankles and decrease her weight to 96 pounds. She also takes a heart medication and vitamins.  She mentions having pain in her right leg, attributed to a fall over a year ago that resulted in a twisted ankle. She has been wearing compression hose, which has helped with the swelling. She receives physical therapy three times a week and occupational therapy twice a week.  She is trying to engage in social activities, such as attending programs and movies in her building, but needs to take breaks due to fatigue. She often takes a nap in the afternoon after lunch.   She had thoracentesis performed on 27 May.  The fluid was more consistent with a transudate.  Since the thoracentesis and initiation of diuretic she has felt her shortness of breath is better.  Her main issue is just remaining fatigue.  We discussed her nocturnal hypoxemia and recommendation for supplemental oxygen however she is adamant that she does not want supplemental O2 at nighttime.  She understands the potential implications of this.   DATA 05/21/2023 CT chest without contrast: Left lower lobe pneumonia with suspected parapneumonic effusion. 05/22/2023 echocardiogram: LVEF 60 to 65% indeterminate left ventricular diastolic parameters.  Mildly elevated pulmonary artery systolic pressure.  Moderate to severe mitral valve regurgitation.  Moderate tricuspid valve regurgitation. 06/01/2023 chest x-ray PA and lateral: Blunting of the right costophrenic angle, small effusion noted on the left, independently reviewed. 06/07/2023 thoracentesis: Fluid consistent with transudate. 06/17/2023 overnight oximetry: O2 desaturations to as  low as 72%.  Patient declined nocturnal oxygen supplementation.  Review of Systems A 10 point review of systems was performed and it is as noted above otherwise negative.   Patient Active  Problem List   Diagnosis Date Noted   Iron deficiency anemia due to chronic blood loss 06/02/2023   Hospital discharge follow-up 06/02/2023   Rectal bleeding 05/23/2023   Pleural effusion 05/22/2023   Shortness of breath 05/22/2023   GI bleed 05/22/2023   Community acquired pneumonia of left lower lobe of lung 05/21/2023   Atrial fibrillation (HCC) 05/21/2023   Fear of falling 01/10/2023   Counseling regarding advance care planning and goals of care 07/09/2022   COVID-19 virus infection 04/05/2022   History of fall within past 90 days 03/26/2022   Peripheral vascular disease (HCC) 03/26/2022   Constipation 01/02/2022   Hiatal hernia 10/10/2021   Pancreatic duct dilated 10/10/2021   Abdominal aortic atherosclerosis (HCC) 10/09/2021   Leg swelling 09/16/2021   Hearing loss of right ear due to cerumen impaction 07/01/2021   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, unspecified stage (HCC) 01/16/2021   Macular degeneration, bilateral 07/19/2020   Nocturia 04/10/2019   Sciatica 12/21/2018   Post-menopausal atrophic vaginitis 08/09/2018   Prediabetes 01/29/2018   Degenerative cervical spinal stenosis 12/10/2017   Low back pain 04/14/2017   Lumbar spinal stenosis 12/15/2016   Essential hypertension 11/27/2016   Pure hypercholesterolemia 04/15/2016   Vitamin D  deficiency 04/15/2016   Osteoporosis 12/25/2015   Visit for preventive health examination 01/19/2015   Acquired hypothyroidism 07/16/2014   Hereditary and idiopathic peripheral neuropathy 07/16/2014    Social History   Tobacco Use   Smoking status: Former   Smokeless tobacco: Never  Substance Use Topics   Alcohol use: Yes    Alcohol/week: 5.0 standard drinks of alcohol    Types: 5 Standard drinks or equivalent per week    Comment: with dinner    Allergies  Allergen Reactions   Sulfa Antibiotics Other (See Comments)    Childhood reaction     Current Meds  Medication Sig   Cholecalciferol (VITAMIN  D3) 50 MCG (2000 UT) capsule Take 2,000 Units by mouth daily.   denosumab  (PROLIA ) 60 MG/ML SOSY injection Inject 60 mg into the skin every 6 (six) months.   diclofenac Sodium (VOLTAREN) 1 % GEL Apply 2 g topically as needed (pain).   ferrous sulfate  325 (65 FE) MG tablet Take 1 tablet (325 mg total) by mouth daily with breakfast.   furosemide  (LASIX ) 20 MG tablet Take 1 tablet (20 mg total) by mouth every other day.   latanoprost (XALATAN) 0.005 % ophthalmic solution Place 1 drop into the left eye at bedtime.   levothyroxine  (SYNTHROID ) 50 MCG tablet TAKE 1 TABLET EVERY DAY ON EMPTY STOMACHWITH A GLASS OF WATER AT LEAST 30-60 MINBEFORE BREAKFAST   metoprolol  succinate (TOPROL  XL) 25 MG 24 hr tablet Take 1 tablet (25 mg total) by mouth daily.   Multiple Vitamins-Minerals (PRESERVISION AREDS 2 PO) Take by mouth.   Propylene Glycol (SYSTANE BALANCE OP) Apply to eye.    Immunization History  Administered Date(s) Administered   Fluad Quad(high Dose 65+) 10/17/2019   Influenza, High Dose Seasonal PF 10/16/2015, 10/31/2017   Influenza-Unspecified 09/12/2014, 09/27/2016, 11/08/2018, 10/25/2020, 12/10/2021, 11/08/2022   PFIZER Comirnaty(Gray Top)Covid-19 Tri-Sucrose Vaccine 01/17/2019, 02/14/2019, 10/18/2019   PFIZER(Purple Top)SARS-COV-2 Vaccination 01/17/2019, 02/14/2019, 10/18/2019, 05/28/2020   Pfizer(Comirnaty)Fall Seasonal Vaccine 12 years and older 12/10/2021   Pneumococcal Conjugate-13 01/17/2015   Pneumococcal Polysaccharide-23 01/17/2011   Td  12/11/2015   Tdap 12/11/2015, 12/11/2015   Zoster Recombinant(Shingrix) 07/16/2020, 11/17/2020   Zoster, Live 07/15/2012        Objective:     BP 134/72 (BP Location: Left Arm, Patient Position: Sitting, Cuff Size: Normal)   Pulse (!) 56   Temp 98.1 F (36.7 C) (Oral)   Ht 4' 9 (1.448 m)   Wt 96 lb (43.5 kg)   LMP  (LMP Unknown)   SpO2 97%   BMI 20.77 kg/m   SpO2: 97 %  GENERAL: Frail-appearing elderly woman in no respiratory  distress.  She is fully ambulatory today. HEAD: Normocephalic, atraumatic.  EYES: Pupils equal, round, reactive to light.  No scleral icterus.  MOUTH: Some missing teeth, partial lower, upper dentures.  Oral mucosa moist.  No thrush. NECK: Supple. No thyromegaly. Trachea midline. No JVD.  No adenopathy. PULMONARY: Good air entry bilaterally.  Good air entry on the left base (previously diminished).  No adventitious sounds. CARDIOVASCULAR: S1 and S2. Regular rate and rhythm.  Grade 2/6 systolic ejection murmur left sternal border. ABDOMEN: Benign. MUSCULOSKELETAL: No joint deformity, no clubbing, no edema with compression stockings on.  NEUROLOGIC: No overt focal deficit.  Ambulating independently. SKIN: Intact,warm,dry. PSYCH: Mood and behavior normal.  Recent Results (from the past 2160 hours)  Brain natriuretic peptide     Status: Abnormal   Collection Time: 05/21/23  9:33 AM  Result Value Ref Range   B Natriuretic Peptide 237.3 (H) 0.0 - 100.0 pg/mL    Comment: Performed at [facility], [address], [place], [place] [address]  Basic metabolic panel     Status: Abnormal   Collection Time: 05/21/23  9:33 AM  Result Value Ref Range   Sodium 137 135 - 145 mmol/L   Potassium 3.6 3.5 - 5.1 mmol/L   Chloride 103 98 - 111 mmol/L   CO2 26 22 - 32 mmol/L   Glucose, Bld 118 (H) 70 - 99 mg/dL    Comment: Glucose reference range applies only to samples taken after fasting for at least 8 hours.   BUN 13 8 - 23 mg/dL   Creatinine, Ser 6.04 0.44 - 1.00 mg/dL   Calcium  8.4 (L) 8.9 - 10.3 mg/dL   GFR, Estimated >54 >09 mL/min    Comment: (NOTE) Calculated using the CKD-EPI Creatinine Equation (2021)    Anion gap 8 5 - 15    Comment: Performed at [facility], [address] [place], [place] [address]  CBC with Differential     Status: None   Collection Time: 05/21/23  9:33 AM  Result Value Ref Range   WBC 7.7 4.0 - 10.5 K/uL   RBC 4.59 3.87 - 5.11 MIL/uL    Hemoglobin 13.7 12.0 - 15.0 g/dL   HCT 47.8 29.5 - 62.1 %   MCV 94.8 80.0 - 100.0 fL   MCH 29.8 26.0 - 34.0 pg   MCHC 31.5 30.0 - 36.0 g/dL   RDW 30.8 65.7 - 84.6 %   Platelets 245 150 - 400 K/uL   nRBC 0.0 0.0 - 0.2 %   Neutrophils Relative % 78 %   Neutro Abs 6.0 1.7 - 7.7 K/uL   Lymphocytes Relative 15 %   Lymphs Abs 1.2 0.7 - 4.0 K/uL   Monocytes Relative 6 %   Monocytes Absolute 0.4 0.1 - 1.0 K/uL   Eosinophils Relative 0 %   Eosinophils Absolute 0.0 0.0 - 0.5 K/uL   Basophils Relative 1 %   Basophils Absolute 0.0  0.0 - 0.1 K/uL   Immature Granulocytes 0 %   Abs Immature Granulocytes 0.02 0.00 - 0.07 K/uL    Comment: Performed at [facility], [address] [place], [place] [address]  Troponin I (High Sensitivity)     Status: None   Collection Time: 05/21/23  9:33 AM  Result Value Ref Range   Troponin I (High Sensitivity) 9 <18 ng/L    Comment: (NOTE) Elevated high sensitivity troponin I (hsTnI) values and significant  changes across serial measurements may suggest ACS but many other  chronic and acute conditions are known to elevate hsTnI results.  Refer to the Links section for chest pain algorithms and additional  guidance. Performed at [facility], [address] [place], [place] [address]   Resp panel by RT-PCR (RSV, Flu A&B, Covid) Anterior Nasal Swab     Status: None   Collection Time: 05/21/23  9:34 AM   Specimen: Anterior Nasal Swab  Result Value Ref Range   SARS Coronavirus 2 by RT PCR NEGATIVE NEGATIVE    Comment: (NOTE) SARS-CoV-2 target nucleic acids are NOT DETECTED.  The SARS-CoV-2 RNA is generally detectable in upper respiratory specimens during the acute phase of infection. The lowest concentration of SARS-CoV-2 viral copies this assay can detect is 138 copies/mL. A negative result does not preclude SARS-Cov-2 infection and should not be used as the sole basis for treatment or other patient management decisions. A negative  result may occur with  improper specimen collection/handling, submission of specimen other than nasopharyngeal swab, presence of viral mutation(s) within the areas targeted by this assay, and inadequate number of viral copies(<138 copies/mL). A negative result must be combined with clinical observations, patient history, and epidemiological information. The expected result is Negative.  Fact Sheet for Patients:  BloggerCourse.com  Fact Sheet for Healthcare Providers:  SeriousBroker.it  This test is no t yet approved or cleared by the [place]  FDA and  has been authorized for detection and/or diagnosis of SARS-CoV-2 by FDA under an Emergency Use Authorization (EUA). This EUA will remain  in effect (meaning this test can be used) for the duration of the COVID-19 declaration under Section 564(b)(1) of the Act, 21 U.S.C.section 360bbb-3(b)(1), unless the authorization is terminated  or revoked sooner.       Influenza A by PCR NEGATIVE NEGATIVE   Influenza B by PCR NEGATIVE NEGATIVE    Comment: (NOTE) The Xpert Xpress SARS-CoV-2/FLU/RSV plus assay is intended as an aid in the diagnosis of influenza from Nasopharyngeal swab specimens and should not be used as a sole basis for treatment. Nasal washings and aspirates are unacceptable for Xpert Xpress SARS-CoV-2/FLU/RSV testing.  Fact Sheet for Patients: BloggerCourse.com  Fact Sheet for Healthcare Providers: SeriousBroker.it  This test is not yet approved or cleared by the [place]  FDA and has been authorized for detection and/or diagnosis of SARS-CoV-2 by FDA under an Emergency Use Authorization (EUA). This EUA will remain in effect (meaning this test can be used) for the duration of the COVID-19 declaration under Section 564(b)(1) of the Act, 21 U.S.C. section 360bbb-3(b)(1), unless the authorization is terminated  or revoked.     Resp Syncytial Virus by PCR NEGATIVE NEGATIVE    Comment: (NOTE) Fact Sheet for Patients: BloggerCourse.com  Fact Sheet for Healthcare Providers: SeriousBroker.it  This test is not yet approved or cleared by the [place]  FDA and has been authorized for detection and/or diagnosis of SARS-CoV-2 by FDA under an Emergency Use Authorization (EUA). This EUA will  remain in effect (meaning this test can be used) for the duration of the COVID-19 declaration under Section 564(b)(1) of the Act, 21 U.S.C. section 360bbb-3(b)(1), unless the authorization is terminated or revoked.  Performed at [facility], [address] [place], [place] [address]   Troponin I (High Sensitivity)     Status: None   Collection Time: 05/21/23 11:20 AM  Result Value Ref Range   Troponin I (High Sensitivity) 9 <18 ng/L    Comment: (NOTE) Elevated high sensitivity troponin I (hsTnI) values and significant  changes across serial measurements may suggest ACS but many other  chronic and acute conditions are known to elevate hsTnI results.  Refer to the Links section for chest pain algorithms and additional  guidance. Performed at [facility], [address] [place], [place] [address]   HIV Antibody (routine testing w rflx)     Status: None   Collection Time: 05/21/23  8:49 PM  Result Value Ref Range   HIV Screen 4th Generation wRfx Non Reactive Non Reactive    Comment: Performed at [facility], [address], [place], [place] [address]  CBC     Status: Abnormal   Collection Time: 05/22/23  3:35 AM  Result Value Ref Range   WBC 9.2 4.0 - 10.5 K/uL   RBC 3.68 (L) 3.87 - 5.11 MIL/uL   Hemoglobin 11.2 (L) 12.0 - 15.0 g/dL   HCT 46.9 (L) 62.9 - 52.8 %   MCV 94.3 80.0 - 100.0 fL   MCH 30.4 26.0 - 34.0 pg   MCHC 32.3 30.0 - 36.0 g/dL   RDW 41.3 24.4 - 01.0 %   Platelets 210 150 - 400 K/uL   nRBC 0.0 0.0 -  0.2 %    Comment: Performed at [facility], [address] [place], [place] [address]  Comprehensive metabolic panel     Status: Abnormal   Collection Time: 05/22/23  3:35 AM  Result Value Ref Range   Sodium 138 135 - 145 mmol/L   Potassium 4.1 3.5 - 5.1 mmol/L   Chloride 108 98 - 111 mmol/L   CO2 23 22 - 32 mmol/L   Glucose, Bld 174 (H) 70 - 99 mg/dL    Comment: Glucose reference range applies only to samples taken after fasting for at least 8 hours.   BUN 15 8 - 23 mg/dL   Creatinine, Ser 6.64 0.44 - 1.00 mg/dL   Calcium  7.6 (L) 8.9 - 10.3 mg/dL   Total Protein 5.4 (L) 6.5 - 8.1 g/dL   Albumin 2.8 (L) 3.5 - 5.0 g/dL   AST 24 15 - 41 U/L   ALT 16 0 - 44 U/L   Alkaline Phosphatase 46 38 - 126 U/L   Total Bilirubin 1.0 0.0 - 1.2 mg/dL   GFR, Estimated >40 >34 mL/min    Comment: (NOTE) Calculated using the CKD-EPI Creatinine Equation (2021)    Anion gap 7 5 - 15    Comment: Performed at [facility], [address] [place], [place] [address]  Type and screen [facility]     Status: None   Collection Time: 05/22/23  4:12 AM  Result Value Ref Range   ABO/RH(D) O POS    Antibody Screen NEG    Sample Expiration      05/25/2023,2359 Performed at [facility], [address], [place], [place] [address]   CBC with Differential/Platelet     Status: Abnormal   Collection Time:  05/22/23 10:21 AM  Result Value Ref Range   WBC 8.6 4.0 - 10.5 K/uL   RBC 3.35 (L) 3.87 - 5.11 MIL/uL   Hemoglobin 10.4 (L) 12.0 - 15.0 g/dL   HCT 16.1 (L) 09.6 - 04.5 %   MCV 95.5 80.0 - 100.0 fL   MCH 31.0 26.0 - 34.0 pg   MCHC 32.5 30.0 - 36.0 g/dL   RDW 40.9 81.1 - 91.4 %   Platelets 213 150 - 400 K/uL   nRBC 0.0 0.0 - 0.2 %   Neutrophils Relative % 80 %   Neutro Abs 6.9 1.7 - 7.7 K/uL   Lymphocytes Relative 15 %   Lymphs Abs 1.3 0.7 - 4.0 K/uL   Monocytes Relative 4 %   Monocytes Absolute 0.4 0.1 - 1.0 K/uL   Eosinophils Relative 0 %    Eosinophils Absolute 0.0 0.0 - 0.5 K/uL   Basophils Relative 0 %   Basophils Absolute 0.0 0.0 - 0.1 K/uL   Immature Granulocytes 1 %   Abs Immature Granulocytes 0.04 0.00 - 0.07 K/uL    Comment: Performed at [facility], [address] [place], [place] [address]  ECHOCARDIOGRAM COMPLETE     Status: None   Collection Time: 05/22/23 12:34 PM  Result Value Ref Range   Weight 1,771.2 oz   Height 59 in   BP 120/60 mmHg   S' Lateral 2.10 cm   Area-P 1/2 2.29 cm2   Est EF 60 - 65%   CBC with Differential/Platelet     Status: Abnormal   Collection Time: 05/22/23  2:15 PM  Result Value Ref Range   WBC 9.1 4.0 - 10.5 K/uL   RBC 3.07 (L) 3.87 - 5.11 MIL/uL   Hemoglobin 9.5 (L) 12.0 - 15.0 g/dL   HCT 62.1 (L) 30.8 - 65.7 %   MCV 94.8 80.0 - 100.0 fL   MCH 30.9 26.0 - 34.0 pg   MCHC 32.6 30.0 - 36.0 g/dL   RDW 84.6 96.2 - 95.2 %   Platelets 210 150 - 400 K/uL   nRBC 0.0 0.0 - 0.2 %   Neutrophils Relative % 67 %   Neutro Abs 6.0 1.7 - 7.7 K/uL   Lymphocytes Relative 24 %   Lymphs Abs 2.2 0.7 - 4.0 K/uL   Monocytes Relative 9 %   Monocytes Absolute 0.8 0.1 - 1.0 K/uL   Eosinophils Relative 0 %   Eosinophils Absolute 0.0 0.0 - 0.5 K/uL   Basophils Relative 0 %   Basophils Absolute 0.0 0.0 - 0.1 K/uL   Immature Granulocytes 0 %   Abs Immature Granulocytes 0.04 0.00 - 0.07 K/uL    Comment: Performed at [facility], [address] [place], [place] [address]  Expectorated Sputum Assessment w Gram Stain, Rflx to Resp Cult     Status: None   Collection Time: 05/22/23  5:35 PM   Specimen: Sputum  Result Value Ref Range   Specimen Description SPUTUM    Special Requests NONE    Sputum evaluation      THIS SPECIMEN IS ACCEPTABLE FOR SPUTUM CULTURE Performed at [facility], [address] [place], [place] [address]    Report Status 05/23/2023 FINAL   Culture, Respiratory w Gram Stain     Status: None   Collection Time: 05/22/23  5:35 PM   Specimen: SPU   Result Value Ref Range   Specimen Description      SPUTUM Performed at [facility], [address] [place], [place] [address]    Special Requests      NONE Reflexed from [id number] Performed at [facility], [address] [place], [place] [address]    Gram Stain      NO WBC SEEN MODERATE GRAM NEGATIVE RODS RARE GRAM POSITIVE COCCI IN PAIRS RARE GRAM POSITIVE RODS    Culture      FEW Normal respiratory flora-no Staph aureus or Pseudomonas seen Performed at [facility], [address] [place], [place] [address]    Report Status 05/25/2023 FINAL   TSH     Status: None   Collection Time: 05/23/23  4:18 AM  Result Value Ref Range   TSH 3.209 0.350 - 4.500 uIU/mL    Comment: Performed by a 3rd Generation assay with a functional sensitivity of <=0.01 uIU/mL. Performed at [facility], [address] [place], [place] [address]   CBC with Differential/Platelet     Status: Abnormal   Collection Time: 05/23/23 10:54 AM  Result Value Ref Range   WBC 8.4 4.0 - 10.5 K/uL   RBC 2.72 (L) 3.87 - 5.11 MIL/uL   Hemoglobin 8.4 (L) 12.0 - 15.0 g/dL   HCT 30.8 (L) 65.7 - 84.6 %   MCV 97.1 80.0 - 100.0 fL   MCH 30.9 26.0 - 34.0 pg   MCHC 31.8 30.0 - 36.0 g/dL   RDW 96.2 95.2 - 84.1 %   Platelets 185 150 - 400 K/uL   nRBC 0.0 0.0 - 0.2 %   Neutrophils Relative % 62 %   Neutro Abs 5.1 1.7 - 7.7 K/uL   Lymphocytes Relative 29 %   Lymphs Abs 2.5 0.7 - 4.0 K/uL   Monocytes Relative 9 %   Monocytes Absolute 0.8 0.1 - 1.0 K/uL   Eosinophils Relative 0 %   Eosinophils Absolute 0.0 0.0 - 0.5 K/uL   Basophils Relative 0 %   Basophils Absolute 0.0 0.0 - 0.1 K/uL   Immature Granulocytes 0 %   Abs Immature Granulocytes 0.03 0.00 - 0.07 K/uL    Comment: Performed at [facility], [address] [place], [place] [address]  CBC with Differential/Platelet     Status: Abnormal   Collection Time: 05/24/23  4:55 AM  Result Value Ref Range   WBC 8.2 4.0  - 10.5 K/uL   RBC 2.74 (L) 3.87 - 5.11 MIL/uL   Hemoglobin 8.5 (L) 12.0 - 15.0 g/dL   HCT 10.2 (L) 72.5 - 36.6 %   MCV 96.7 80.0 - 100.0 fL   MCH 31.0 26.0 - 34.0 pg   MCHC 32.1 30.0 - 36.0 g/dL   RDW 44.0 34.7 - 42.5 %   Platelets 173 150 - 400 K/uL   nRBC 0.0 0.0 - 0.2 %   Neutrophils Relative % 51 %   Neutro Abs 4.3 1.7 - 7.7 K/uL   Lymphocytes Relative 35 %   Lymphs Abs 2.9 0.7 - 4.0 K/uL   Monocytes Relative 10 %   Monocytes Absolute 0.9 0.1 - 1.0 K/uL   Eosinophils Relative 2 %   Eosinophils Absolute 0.1 0.0 - 0.5 K/uL   Basophils Relative 1 %   Basophils Absolute 0.0 0.0 - 0.1 K/uL   Immature Granulocytes 1 %   Abs Immature Granulocytes 0.04 0.00 - 0.07 K/uL    Comment: Performed at [facility], [address] [place], [place] [address]  Basic metabolic panel     Status: Abnormal   Collection Time: 05/24/23  4:55  AM  Result Value Ref Range   Sodium 140 135 - 145 mmol/L   Potassium 4.2 3.5 - 5.1 mmol/L   Chloride 108 98 - 111 mmol/L   CO2 26 22 - 32 mmol/L   Glucose, Bld 87 70 - 99 mg/dL    Comment: Glucose reference range applies only to samples taken after fasting for at least 8 hours.   BUN 22 8 - 23 mg/dL   Creatinine, Ser 1.61 0.44 - 1.00 mg/dL   Calcium  7.9 (L) 8.9 - 10.3 mg/dL   GFR, Estimated >09 >60 mL/min    Comment: (NOTE) Calculated using the CKD-EPI Creatinine Equation (2021)    Anion gap 6 5 - 15    Comment: Performed at [facility], [address], [place], [place] [address]  Legionella Pneumophila Serogp 1 Ur Ag     Status: None   Collection Time: 05/24/23  5:50 AM  Result Value Ref Range   L. pneumophila Serogp 1 Ur Ag Negative Negative    Comment: (NOTE) Presumptive negative for L. pneumophila serogroup 1 antigen in urine, suggesting no recent or current infection. Legionnaires' disease cannot be ruled out since other serogroups and species may also cause disease. Performed At: [facility] [address]  [place], [place] [id number] Pearlean Botts MD [id number]    Source of Sample URINE, RANDOM     Comment: Performed at [facility], [address] [place], [place] [address]  Strep pneumoniae urinary antigen     Status: None   Collection Time: 05/24/23  5:50 AM  Result Value Ref Range   Strep Pneumo Urinary Antigen NEGATIVE NEGATIVE    Comment:        Infection due to S. pneumoniae cannot be absolutely ruled out since the antigen present may be below the detection limit of the test. Performed at [facility], [address], [place], [place] [address]   CBC with Differential/Platelet     Status: Abnormal   Collection Time: 05/25/23  2:57 AM  Result Value Ref Range   WBC 7.5 4.0 - 10.5 K/uL   RBC 2.88 (L) 3.87 - 5.11 MIL/uL   Hemoglobin 9.0 (L) 12.0 - 15.0 g/dL   HCT 13.2 (L) 44.0 - 10.2 %   MCV 95.8 80.0 - 100.0 fL   MCH 31.3 26.0 - 34.0 pg   MCHC 32.6 30.0 - 36.0 g/dL   RDW 72.5 36.6 - 44.0 %   Platelets 200 150 - 400 K/uL   nRBC 0.0 0.0 - 0.2 %   Neutrophils Relative % 56 %   Neutro Abs 4.2 1.7 - 7.7 K/uL   Lymphocytes Relative 32 %   Lymphs Abs 2.4 0.7 - 4.0 K/uL   Monocytes Relative 9 %   Monocytes Absolute 0.7 0.1 - 1.0 K/uL   Eosinophils Relative 2 %   Eosinophils Absolute 0.1 0.0 - 0.5 K/uL   Basophils Relative 1 %   Basophils Absolute 0.0 0.0 - 0.1 K/uL   Immature Granulocytes 0 %   Abs Immature Granulocytes 0.03 0.00 - 0.07 K/uL    Comment: Performed at [facility], [address], [place], [place] [address]  Basic metabolic panel     Status: Abnormal   Collection Time: 05/25/23  2:57 AM  Result Value Ref Range   Sodium 138 135 - 145 mmol/L   Potassium 4.2 3.5 - 5.1 mmol/L   Chloride 108 98 - 111 mmol/L   CO2 26 22 - 32 mmol/L  Glucose, Bld 93 70 - 99 mg/dL    Comment: Glucose reference range applies only to samples taken after fasting for at least 8 hours.   BUN 12 8 - 23 mg/dL   Creatinine, Ser 1.61 0.44 - 1.00 mg/dL    Calcium  7.5 (L) 8.9 - 10.3 mg/dL   GFR, Estimated >09 >60 mL/min    Comment: (NOTE) Calculated using the CKD-EPI Creatinine Equation (2021)    Anion gap 4 (L) 5 - 15    Comment: Performed at [facility], [address] [place], [place] [address]  Urinalysis, Routine w reflex microscopic     Status: Abnormal   Collection Time: 06/01/23  2:34 PM  Result Value Ref Range   Color, Urine YELLOW Yellow;Lt. Yellow;Straw;Dark Yellow;Amber;Green;Red;Brown   APPearance CLEAR Clear;Turbid;Slightly Cloudy;Cloudy   Specific Gravity, Urine 1.015 1.000 - 1.030   pH 6.0 5.0 - 8.0   Total Protein, Urine TRACE (A) Negative   Urine Glucose NEGATIVE Negative   Ketones, ur NEGATIVE Negative   Bilirubin Urine NEGATIVE Negative   Hgb urine dipstick NEGATIVE Negative   Urobilinogen, UA 1.0 0.0 - 1.0   Leukocytes,Ua MODERATE (A) Negative   Nitrite NEGATIVE Negative   WBC, UA 11-20/hpf (A) 0-2/hpf   RBC / HPF 0-2/hpf 0-2/hpf   Squamous Epithelial / HPF Few(5-10/hpf) (A) Rare(0-4/hpf)   Renal Epithel, UA Rare(0-4/hpf) (A) None   Bacteria, UA Many(>50/hpf) (A) None   Ca Oxalate Crys, UA Presence of (A) None   Yeast, UA Presence of (A) None  CBC with Differential/Platelet     Status: Abnormal   Collection Time: 06/01/23  2:34 PM  Result Value Ref Range   WBC 8.4 4.0 - 10.5 K/uL   RBC 3.03 (L) 3.87 - 5.11 Mil/uL   Hemoglobin 9.1 (L) 12.0 - 15.0 g/dL   HCT 81.1 (L) 91.4 - 78.2 %   MCV 90.7 78.0 - 100.0 fl   MCHC 32.9 30.0 - 36.0 g/dL   RDW 95.6 21.3 - 08.6 %   Platelets 352.0 150.0 - 400.0 K/uL   Neutrophils Relative % 58.7 43.0 - 77.0 %   Lymphocytes Relative 27.5 12.0 - 46.0 %   Monocytes Relative 12.2 (H) 3.0 - 12.0 %   Eosinophils Relative 0.8 0.0 - 5.0 %   Basophils Relative 0.8 0.0 - 3.0 %   Neutro Abs 4.9 1.4 - 7.7 K/uL   Lymphs Abs 2.3 0.7 - 4.0 K/uL   Monocytes Absolute 1.0 0.1 - 1.0 K/uL   Eosinophils Absolute 0.1 0.0 - 0.7 K/uL   Basophils Absolute 0.1 0.0 - 0.1 K/uL  Iron,  TIBC and Ferritin Panel     Status: Abnormal   Collection Time: 06/01/23  2:34 PM  Result Value Ref Range   Iron <10 (L) 45 - 160 mcg/dL    Comment: Verified by repeat analysis. Aaron Aas    TIBC 322 250 - 450 mcg/dL (calc)   %SAT 2 (L) 16 - 45 % (calc)   Ferritin 32 16 - 288 ng/mL  Lactate dehydrogenase     Status: Abnormal   Collection Time: 06/02/23 12:33 PM  Result Value Ref Range   LDH 193 (H) 98 - 192 U/L    Comment: Performed at [facility], [address] [place], [place] [address]  Comprehensive metabolic panel with GFR     Status: Abnormal   Collection Time: 06/02/23 12:33 PM  Result Value Ref Range   Sodium 137 135 - 145 mmol/L   Potassium 3.4 (L) 3.5 -  5.1 mmol/L   Chloride 102 98 - 111 mmol/L   CO2 26 22 - 32 mmol/L   Glucose, Bld 130 (H) 70 - 99 mg/dL    Comment: Glucose reference range applies only to samples taken after fasting for at least 8 hours.   BUN 14 8 - 23 mg/dL   Creatinine, Ser 1.61 0.44 - 1.00 mg/dL   Calcium  8.0 (L) 8.9 - 10.3 mg/dL   Total Protein 5.5 (L) 6.5 - 8.1 g/dL   Albumin 2.8 (L) 3.5 - 5.0 g/dL   AST 32 15 - 41 U/L   ALT 27 0 - 44 U/L   Alkaline Phosphatase 51 38 - 126 U/L   Total Bilirubin 0.8 0.0 - 1.2 mg/dL   GFR, Estimated >09 >60 mL/min    Comment: (NOTE) Calculated using the CKD-EPI Creatinine Equation (2021)    Anion gap 9 5 - 15    Comment: Performed at [facility], [address] [place], [place] [address]  Urine Culture     Status: Abnormal   Collection Time: 06/02/23  2:12 PM   Specimen: Urine  Result Value Ref Range   MICRO NUMBER: [id number]    SPECIMEN QUALITY: Adequate    Sample Source URINE    STATUS: FINAL    ISOLATE 1: Enterococcus faecalis (A)     Comment: Greater than 100,000 CFU/mL of Enterococcus faecalis      Susceptibility   Enterococcus faecalis - URINE CULTURE POSITIVE 1    AMPICILLIN 8 Sensitive     VANCOMYCIN 2 Sensitive     NITROFURANTOIN* <=16 Sensitive      * Legend: S =  Susceptible  I = Intermediate R = Resistant  NS = Not susceptible SDD = Susceptible Dose Dependent * = Not Tested  NR = Not Reported **NN = See Therapy Comments   Cytology - Non PAP;     Status: None   Collection Time: 06/07/23 12:00 AM  Result Value Ref Range   CYTOLOGY - NON GYN      CYTOLOGY - NON PAP [facility] [address], [address] [place], [place] [address] Telephone [phone] or [phone] Fax [phone]  CYTOPATHOLOGY REPORT   Accession #: [id number] Patient Name: DEIONNA, MARCANTONIO Visit # : [id number]  MRN: [id number] Physician: Coralie Derrick DOB/Age 07/08/1927 (Age: 71) Gender: F Collected Date: 06/07/2023 Received Date: 06/07/2023  FINAL DIAGNOSIS STATEMENT Of SPECIMEN ADEQUACY:  INTERPRETATION(S):      NEGATIVE FOR MALIGNANCY      DATE SIGNED OUT: 06/09/2023 ELECTRONIC SIGNATURE : Dillard Frame Md, Pathologist, Electronic Signature   CASE COMMENTS   CLINICAL HISTORY  SOURCE OF SPECIMEN(S) Pleural Fluid, Left  SPECIMEN COMMENTS: SPECIMEN CLINICAL INFORMATION:    Gross Description Specimen: Received is/are 450cc of yellow  fluid in an evacuated container and one slide      Prepared:      # Smears: 1      # Concentration Technique Slides (i.e. ThinPrep): 1      # Cell Block: 1      #  Diff-Quick Stain: 0        Report signed out from the following location(s) [place] [address] [place], [place] [address] CLIA #: [id number]  [facility] [address] [place], [place] [address] CLIA #: [id number]   Lactate dehydrogenase (pleural or peritoneal fluid)     Status: Abnormal   Collection Time: 06/07/23  1:44 PM  Result Value Ref  Range   LD, Fluid 70 (H) 3 - 23 U/L    Comment: (NOTE) Results should be evaluated in conjunction with serum values    Fluid Type-FLDH CYTO PLEU     Comment: Performed at [facility], [address] [place], [place] [address]   Triglycerides, Body Fluid     Status: None   Collection Time: 06/07/23  1:44 PM  Result Value Ref Range   Triglycerides, Fluid <9 Not Estab. mg/dL    Comment: (NOTE) The reference interval(s) and other method performance specifications have not been established for this body fluid. The test result must be integrated into the clinical context for interpretation. Performed At: [facility] [address] [place], [place] [id number] Pearlean Botts MD [id number]    Fluid Type-FTRIG CYTO PLEU     Comment: Performed at [facility], [address] [place], [place] [address]  Body fluid cell count with differential     Status: Abnormal   Collection Time: 06/07/23  1:44 PM  Result Value Ref Range   Fluid Type-FCT CYTO PLEU    Color, Fluid YELLOW (A) YELLOW   Appearance, Fluid HAZY (A) CLEAR   Total Nucleated Cell Count, Fluid 331 cu mm   Neutrophil Count, Fluid 4 %   Lymphs, Fluid 89 %   Monocyte-Macrophage-Serous Fluid 7 %   Eos, Fluid 0 %    Comment: Performed at [facility], [address] [place], [place] [address]  Cholesterol, body fluid     Status: None   Collection Time: 06/07/23  1:44 PM  Result Value Ref Range   Cholesterol, Fluid 17 mg/dL    Comment: (NOTE) INTERPRETIVE INFORMATION: Cholesterol, Body Fluid For information on body fluid reference ranges and/or interpretive guidance visit [email or web address] This test was developed and its performance characteristics determined by [employer]. It has not been cleared or approved by the [place]  Food and Drug Administration. This test was performed in a CLIA certified laboratory and is intended for clinical purposes. Performed At: [facility] [address] [place], [place] [id number] Greggory Learn MDPhD [id number]    Chol, Fluid Type CYTO PLEU     Comment: Performed at [facility], [address] [place], [place] [address]  Amylase, pleural  or peritoneal fluid        Status: None   Collection Time: 06/07/23  1:44 PM  Result Value Ref Range   Amylase, Fluid 27 U/L    Comment: NO NORMAL RANGE ESTABLISHED FOR THIS TEST   Fluid Type-FAMY PLEURAL     Comment: Performed at [facility], [address] [place], [place] [address] CORRECTED ON 05/27 AT 1437: PREVIOUSLY REPORTED AS CYTO PLEU   Albumin, pleural or peritoneal fluid      Status: None   Collection Time: 06/07/23  1:44 PM  Result Value Ref Range   Albumin, Fluid <1.5 g/dL    Comment: (NOTE) No normal range established for this test Results should be evaluated in conjunction with serum values    Fluid Type-FALB CYTO PLEU     Comment: Performed at [facility], [address] [place], [place] [address]  Protein, pleural or peritoneal fluid     Status: None   Collection Time: 06/07/23  1:44 PM  Result Value Ref Range   Total protein, fluid <3.0 g/dL    Comment: (NOTE) No normal range established for this test Results should be evaluated in conjunction with serum values    Fluid  Type-FTP CYTO PLEU     Comment: Performed at [facility], [address] [place], [place] [address]  Glucose, pleural or peritoneal fluid     Status: None   Collection Time: 06/07/23  1:44 PM  Result Value Ref Range   Glucose, Fluid 111 mg/dL    Comment: (NOTE) No normal range established for this test Results should be evaluated in conjunction with serum values    Fluid Type-FGLU CYTO PLEU     Comment: Performed at [facility], [address] [place], [place] [address]  Acid Fast Smear (AFB)     Status: None   Collection Time: 06/07/23  1:44 PM   Specimen: PATH Cytology Pleural fluid  Result Value Ref Range   AFB Specimen Processing Concentration    Acid Fast Smear Negative     Comment: (NOTE) Performed At: [facility] [address] [place], [place] [id number] Pearlean Botts MD [id number]    Source (AFB) PLEURAL      Comment: Performed at [facility], [address] [place], [place] [address]  Body fluid culture w Gram Stain     Status: None   Collection Time: 06/07/23  1:44 PM   Specimen: PATH Cytology Pleural fluid  Result Value Ref Range   Specimen Description      PLEURAL Performed at [facility], [address] [place], [place] [address]    Special Requests      NONE Performed at [facility], [address] [place], [place] [address]    Gram Stain      RARE WBC PRESENT, PREDOMINANTLY MONONUCLEAR NO ORGANISMS SEEN    Culture      NO GROWTH 3 DAYS Performed at [facility], [address] [place], [place] [address]    Report Status 06/11/2023 FINAL   CBC     Status: Abnormal   Collection Time: 06/22/23 12:12 PM  Result Value Ref Range   WBC 6.5 3.4 - 10.8 x10E3/uL   RBC 3.83 3.77 - 5.28 x10E6/uL   Hemoglobin 10.8 (L) 11.1 - 15.9 g/dL   Hematocrit 27.2 53.6 - 46.6 %   MCV 92 79 - 97 fL   MCH 28.2 26.6 - 33.0 pg   MCHC 30.7 (L) 31.5 - 35.7 g/dL   RDW 64.4 03.4 - 74.2 %   Platelets 413 150 - 450 x10E3/uL         Assessment & Plan:     ICD-10-CM   1. Pleural effusion on left - Resolved  J90     2. Persistent atrial fibrillation (HCC)  I48.19     3. Mitral valve insufficiency, unspecified etiology  I34.0     4. Pulmonary hypertension (HCC)  I27.20     5. Iron deficiency anemia due to chronic blood loss  D50.0     6. Fatigue, unspecified type  R53.83      Discussion:    Mitral valve regurgitation Moderate to severe mitral valve regurgitation contributing to shortness of breath and fatigue, causing increased pulmonary artery pressure and exacerbating pulmonary hypertension. The condition is chronic and requires ongoing management. - Continue diuretics to reduce fluid overload. - Encourage activity as tolerated, with rest as needed.  Pulmonary hypertension Mild pulmonary hypertension secondary to mitral valve regurgitation,  contributing to shortness of breath and decreased oxygen saturation during sleep. Discussed potential benefit of nocturnal supplemental oxygen to improve sleep quality and oxygenation. She is reluctant to use it due to discomfort with the nasal  cannula and concerns about lifelong use. - Consider trial of nocturnal supplemental oxygen if she agrees.  Shortness of breath on exertion/fatigue Chronic shortness of breath on exertion related to mitral valve regurgitation and pulmonary hypertension. Symptoms are persistent. - Encourage gradual increase in physical activity as tolerated.  Pleural effusion Previous pleural effusion likely secondary to heart failure due to mitral valve regurgitation. Current examination suggests no reaccumulation of fluid, likely due to effective diuretic therapy. - Continue diuretic therapy to manage fluid status.  Goals of Care She desires to pass with dignity and is reluctant to use supplemental oxygen due to concerns about quality of life and lifelong dependency. She is aware of the potential consequences of not using supplemental oxygen, including increased pulmonary artery pressure and less efficient sleep. - Respect her wishes regarding end-of-life care and continue discussions about her goals and preferences.  - Prefers to limit doctor visits, will have her follow-up here on an as-needed basis.  Continue follow-up with Dr. Tullo.    Advised if symptoms do not improve or worsen, to please contact office for sooner follow up or seek emergency care.    I spent 40 minutes of dedicated to the care of this patient on the date of this encounter to include pre-visit review of records, face-to-face time with the patient discussing conditions above, post visit ordering of testing, clinical documentation with the electronic health record, making appropriate referrals as documented, and communicating necessary findings to members of the patients care team.  C. Chloe Counter,  MD Advanced Bronchoscopy [facility]    *This note was generated using voice recognition software/Dragon and/or AI transcription program.  Despite best efforts to proofread, errors can occur which can change the meaning. Any transcriptional errors that result from this process are unintentional and may not be fully corrected at the time of dictation.

## 2023-06-29 DIAGNOSIS — R296 Repeated falls: Secondary | ICD-10-CM | POA: Diagnosis not present

## 2023-06-29 DIAGNOSIS — R2681 Unsteadiness on feet: Secondary | ICD-10-CM | POA: Diagnosis not present

## 2023-06-29 DIAGNOSIS — M5459 Other low back pain: Secondary | ICD-10-CM | POA: Diagnosis not present

## 2023-06-30 DIAGNOSIS — Z9181 History of falling: Secondary | ICD-10-CM | POA: Diagnosis not present

## 2023-06-30 DIAGNOSIS — R2689 Other abnormalities of gait and mobility: Secondary | ICD-10-CM | POA: Diagnosis not present

## 2023-06-30 DIAGNOSIS — J189 Pneumonia, unspecified organism: Secondary | ICD-10-CM | POA: Diagnosis not present

## 2023-06-30 DIAGNOSIS — R2681 Unsteadiness on feet: Secondary | ICD-10-CM | POA: Diagnosis not present

## 2023-06-30 DIAGNOSIS — M6281 Muscle weakness (generalized): Secondary | ICD-10-CM | POA: Diagnosis not present

## 2023-06-30 DIAGNOSIS — I480 Paroxysmal atrial fibrillation: Secondary | ICD-10-CM | POA: Diagnosis not present

## 2023-07-01 ENCOUNTER — Encounter: Payer: Self-pay | Admitting: Pulmonary Disease

## 2023-07-01 DIAGNOSIS — R2681 Unsteadiness on feet: Secondary | ICD-10-CM | POA: Diagnosis not present

## 2023-07-01 DIAGNOSIS — R296 Repeated falls: Secondary | ICD-10-CM | POA: Diagnosis not present

## 2023-07-01 DIAGNOSIS — M5459 Other low back pain: Secondary | ICD-10-CM | POA: Diagnosis not present

## 2023-07-04 DIAGNOSIS — R2681 Unsteadiness on feet: Secondary | ICD-10-CM | POA: Diagnosis not present

## 2023-07-04 DIAGNOSIS — M5459 Other low back pain: Secondary | ICD-10-CM | POA: Diagnosis not present

## 2023-07-04 DIAGNOSIS — R296 Repeated falls: Secondary | ICD-10-CM | POA: Diagnosis not present

## 2023-07-05 DIAGNOSIS — I480 Paroxysmal atrial fibrillation: Secondary | ICD-10-CM | POA: Diagnosis not present

## 2023-07-05 DIAGNOSIS — Z9181 History of falling: Secondary | ICD-10-CM | POA: Diagnosis not present

## 2023-07-05 DIAGNOSIS — M6281 Muscle weakness (generalized): Secondary | ICD-10-CM | POA: Diagnosis not present

## 2023-07-05 DIAGNOSIS — R2681 Unsteadiness on feet: Secondary | ICD-10-CM | POA: Diagnosis not present

## 2023-07-05 DIAGNOSIS — R2689 Other abnormalities of gait and mobility: Secondary | ICD-10-CM | POA: Diagnosis not present

## 2023-07-05 DIAGNOSIS — J189 Pneumonia, unspecified organism: Secondary | ICD-10-CM | POA: Diagnosis not present

## 2023-07-06 ENCOUNTER — Ambulatory Visit
Admission: RE | Admit: 2023-07-06 | Discharge: 2023-07-06 | Disposition: A | Discharge status: Home / Self Care | Source: Ambulatory Visit | Attending: Internal Medicine | Admitting: Internal Medicine

## 2023-07-06 DIAGNOSIS — R9389 Abnormal findings on diagnostic imaging of other specified body structures: Secondary | ICD-10-CM | POA: Insufficient documentation

## 2023-07-06 DIAGNOSIS — R2681 Unsteadiness on feet: Secondary | ICD-10-CM | POA: Diagnosis not present

## 2023-07-06 DIAGNOSIS — M5459 Other low back pain: Secondary | ICD-10-CM | POA: Diagnosis not present

## 2023-07-06 DIAGNOSIS — J9811 Atelectasis: Secondary | ICD-10-CM | POA: Diagnosis not present

## 2023-07-06 DIAGNOSIS — R296 Repeated falls: Secondary | ICD-10-CM | POA: Diagnosis not present

## 2023-07-06 DIAGNOSIS — J90 Pleural effusion, not elsewhere classified: Secondary | ICD-10-CM | POA: Diagnosis not present

## 2023-07-07 DIAGNOSIS — R2689 Other abnormalities of gait and mobility: Secondary | ICD-10-CM | POA: Diagnosis not present

## 2023-07-07 DIAGNOSIS — Z9181 History of falling: Secondary | ICD-10-CM | POA: Diagnosis not present

## 2023-07-07 DIAGNOSIS — J189 Pneumonia, unspecified organism: Secondary | ICD-10-CM | POA: Diagnosis not present

## 2023-07-07 DIAGNOSIS — M6281 Muscle weakness (generalized): Secondary | ICD-10-CM | POA: Diagnosis not present

## 2023-07-07 DIAGNOSIS — I480 Paroxysmal atrial fibrillation: Secondary | ICD-10-CM | POA: Diagnosis not present

## 2023-07-07 DIAGNOSIS — R2681 Unsteadiness on feet: Secondary | ICD-10-CM | POA: Diagnosis not present

## 2023-07-07 NOTE — Telephone Encounter (Signed)
Left message for patient with results. Advised to call back with any questions.  

## 2023-07-08 DIAGNOSIS — M5459 Other low back pain: Secondary | ICD-10-CM | POA: Diagnosis not present

## 2023-07-08 DIAGNOSIS — R296 Repeated falls: Secondary | ICD-10-CM | POA: Diagnosis not present

## 2023-07-08 DIAGNOSIS — R2681 Unsteadiness on feet: Secondary | ICD-10-CM | POA: Diagnosis not present

## 2023-07-11 ENCOUNTER — Encounter: Payer: Self-pay | Admitting: Internal Medicine

## 2023-07-11 ENCOUNTER — Ambulatory Visit (INDEPENDENT_AMBULATORY_CARE_PROVIDER_SITE_OTHER): Admitting: Internal Medicine

## 2023-07-11 ENCOUNTER — Ambulatory Visit: Payer: PPO | Admitting: Internal Medicine

## 2023-07-11 VITALS — BP 130/68 | HR 61 | Ht <= 58 in | Wt 96.4 lb

## 2023-07-11 DIAGNOSIS — M5431 Sciatica, right side: Secondary | ICD-10-CM

## 2023-07-11 DIAGNOSIS — J189 Pneumonia, unspecified organism: Secondary | ICD-10-CM | POA: Diagnosis not present

## 2023-07-11 DIAGNOSIS — R7303 Prediabetes: Secondary | ICD-10-CM

## 2023-07-11 DIAGNOSIS — J90 Pleural effusion, not elsewhere classified: Secondary | ICD-10-CM | POA: Diagnosis not present

## 2023-07-11 DIAGNOSIS — R351 Nocturia: Secondary | ICD-10-CM | POA: Diagnosis not present

## 2023-07-11 DIAGNOSIS — D500 Iron deficiency anemia secondary to blood loss (chronic): Secondary | ICD-10-CM

## 2023-07-11 LAB — CBC WITH DIFFERENTIAL/PLATELET
Basophils Absolute: 0 10*3/uL (ref 0.0–0.1)
Basophils Relative: 0.5 % (ref 0.0–3.0)
Eosinophils Absolute: 0 10*3/uL (ref 0.0–0.7)
Eosinophils Relative: 0.4 % (ref 0.0–5.0)
HCT: 36.9 % (ref 36.0–46.0)
Hemoglobin: 11.6 g/dL — ABNORMAL LOW (ref 12.0–15.0)
Lymphocytes Relative: 21.4 % (ref 12.0–46.0)
Lymphs Abs: 1.4 10*3/uL (ref 0.7–4.0)
MCHC: 31.4 g/dL (ref 30.0–36.0)
MCV: 85.7 fl (ref 78.0–100.0)
Monocytes Absolute: 0.8 10*3/uL (ref 0.1–1.0)
Monocytes Relative: 12.6 % — ABNORMAL HIGH (ref 3.0–12.0)
Neutro Abs: 4.3 10*3/uL (ref 1.4–7.7)
Neutrophils Relative %: 65.1 % (ref 43.0–77.0)
Platelets: 269 10*3/uL (ref 150.0–400.0)
RBC: 4.31 Mil/uL (ref 3.87–5.11)
RDW: 16.5 % — ABNORMAL HIGH (ref 11.5–15.5)
WBC: 6.6 10*3/uL (ref 4.0–10.5)

## 2023-07-11 LAB — BASIC METABOLIC PANEL WITH GFR
BUN: 19 mg/dL (ref 6–23)
CO2: 31 meq/L (ref 19–32)
Calcium: 9 mg/dL (ref 8.4–10.5)
Chloride: 101 meq/L (ref 96–112)
Creatinine, Ser: 0.61 mg/dL (ref 0.40–1.20)
GFR: 76.62 mL/min (ref >60.00)
Glucose, Bld: 118 mg/dL — ABNORMAL HIGH (ref 70–99)
Potassium: 3.6 meq/L (ref 3.5–5.1)
Sodium: 139 meq/L (ref 135–145)

## 2023-07-11 LAB — IBC + FERRITIN
Ferritin: 23.5 ng/mL (ref 10.0–291.0)
Iron: 196 ug/dL — ABNORMAL HIGH (ref 42–145)
Saturation Ratios: 52 % — ABNORMAL HIGH (ref 20.0–50.0)
TIBC: 376.6 ug/dL (ref 250.0–450.0)
Transferrin: 269 mg/dL (ref 212.0–360.0)

## 2023-07-11 MED ORDER — FUROSEMIDE 20 MG PO TABS: 20.0000 mg | ORAL_TABLET | ORAL | 2 refills | Status: AC

## 2023-07-11 NOTE — Progress Notes (Unsigned)
 Subjective:  Patient ID: Gloria Carr, female    DOB: [birth year]  Age: 89 y.o. MRN: [id number]  CC: There were no encounter diagnoses.   HPI Eulala Newcombe presents for  Chief Complaint  Patient presents with   Medical Management of Chronic Issues    6 month follow up    H/o   fatigue:  reviewed Dr Evalyn last note,  ECHO.   She has severe MR< pulm HTN.   Has nocturia.  CT chest result still pending ,done June 25.  IDA diagnosed and treated in May with iron supplement and colace   Cc:  right buttock pain .  Was seeing [facility] in the past,  receiving ESI in lumbar spine for stenosis.  Last procedure .ESI Jan 2025 .  Not      Outpatient Medications Prior to Visit  Medication Sig Dispense Refill   Cholecalciferol (VITAMIN D3) 50 MCG (2000 UT) capsule Take 2,000 Units by mouth daily.     denosumab  (PROLIA ) 60 MG/ML SOSY injection Inject 60 mg into the skin every 6 (six) months.     diclofenac Sodium (VOLTAREN) 1 % GEL Apply 2 g topically as needed (pain).     ferrous sulfate  325 (65 FE) MG tablet Take 1 tablet (325 mg total) by mouth daily with breakfast. 30 tablet 2   furosemide  (LASIX ) 20 MG tablet Take 1 tablet (20 mg total) by mouth every other day. 45 tablet 0   latanoprost (XALATAN) 0.005 % ophthalmic solution Place 1 drop into the left eye at bedtime.  5   levothyroxine  (SYNTHROID ) 50 MCG tablet TAKE 1 TABLET EVERY DAY ON EMPTY STOMACHWITH A GLASS OF WATER AT LEAST 30-60 MINBEFORE BREAKFAST 90 tablet 1   metoprolol  succinate (TOPROL  XL) 25 MG 24 hr tablet Take 1 tablet (25 mg total) by mouth daily. 90 tablet 3   Multiple Vitamins-Minerals (PRESERVISION AREDS 2 PO) Take by mouth.     Propylene Glycol (SYSTANE BALANCE OP) Apply to eye.     No facility-administered medications prior to visit.    Review of Systems;  Patient denies headache, fevers, malaise, unintentional weight loss, skin rash, eye pain, sinus congestion and sinus pain, sore throat, dysphagia,  hemoptysis ,  cough, dyspnea, wheezing, chest pain, palpitations, orthopnea, edema, abdominal pain, nausea, melena, diarrhea, constipation, flank pain, dysuria, hematuria, urinary  Frequency, nocturia, numbness, tingling, seizures,  Focal weakness, Loss of consciousness,  Tremor, insomnia, depression, anxiety, and suicidal ideation.      Objective:  BP 130/68   Pulse 61   Ht 4' 9 (1.448 m)   Wt 96 lb 6.4 oz (43.7 kg)   LMP  (LMP Unknown)   SpO2 96%   BMI 20.86 kg/m   BP Readings from Last 3 Encounters:  07/11/23 130/68  06/28/23 134/72  06/22/23 118/60    Wt Readings from Last 3 Encounters:  07/11/23 96 lb 6.4 oz (43.7 kg)  06/28/23 96 lb (43.5 kg)  06/22/23 98 lb 3.2 oz (44.5 kg)    Physical Exam  Lab Results  Component Value Date   HGBA1C 6.2 01/10/2023   HGBA1C 5.9 03/26/2022   HGBA1C 5.9 06/29/2021    Lab Results  Component Value Date   CREATININE 0.71 06/02/2023   CREATININE 0.58 05/25/2023   CREATININE 0.65 05/24/2023    Lab Results  Component Value Date   WBC 6.5 06/22/2023   HGB 10.8 (L) 06/22/2023   HCT 35.2 06/22/2023   PLT 413 06/22/2023   GLUCOSE 130 (H) 06/02/2023  CHOL 155 01/10/2023   TRIG 71.0 01/10/2023   HDL 81.70 01/10/2023   LDLDIRECT 47.0 01/10/2023   LDLCALC 60 01/10/2023   ALT 27 06/02/2023   AST 32 06/02/2023   NA 137 06/02/2023   K 3.4 (L) 06/02/2023   CL 102 06/02/2023   CREATININE 0.71 06/02/2023   BUN 14 06/02/2023   CO2 26 06/02/2023   TSH 3.209 05/23/2023   HGBA1C 6.2 01/10/2023    No results found.  Assessment & Plan:  .There are no diagnoses linked to this encounter.   I spent 34 minutes on the day of this face to face encounter reviewing patient's  most recent visit with cardiology,  nephrology,  and neurology,  prior relevant surgical and non surgical procedures, recent  labs and imaging studies, counseling on weight management,  reviewing the assessment and plan with patient, and post visit ordering and reviewing of   diagnostics and therapeutics with patient  .   Follow-up: No follow-ups on file.   Verneita LITTIE Kettering, MD

## 2023-07-11 NOTE — Patient Instructions (Addendum)
 I'm glad you are making progress  Your buttock pain may be coming from your spinal stenosis.   You can add up to 3000 mg of acetominophen (tylenol ) every day safely  In divided doses ( 1000 mg every 8  hours.)  I recommend you return to Dr Major if your pain does not resolve with tylenol 

## 2023-07-11 NOTE — Assessment & Plan Note (Signed)
 Colonoscopy non diagnostic May 2025 during hospitalization .  Iron studies not done in house but LOW  HOSPITAL f/u WILL START IRON AND COLACE  Lab Results  Component Value Date   IRON <10 (L) 06/01/2023   TIBC 322 06/01/2023   FERRITIN 32 06/01/2023

## 2023-07-12 ENCOUNTER — Ambulatory Visit: Payer: Self-pay | Admitting: Internal Medicine

## 2023-07-12 DIAGNOSIS — J189 Pneumonia, unspecified organism: Secondary | ICD-10-CM | POA: Diagnosis not present

## 2023-07-12 DIAGNOSIS — Z9181 History of falling: Secondary | ICD-10-CM | POA: Diagnosis not present

## 2023-07-12 DIAGNOSIS — R296 Repeated falls: Secondary | ICD-10-CM | POA: Diagnosis not present

## 2023-07-12 DIAGNOSIS — R2681 Unsteadiness on feet: Secondary | ICD-10-CM | POA: Diagnosis not present

## 2023-07-12 DIAGNOSIS — I480 Paroxysmal atrial fibrillation: Secondary | ICD-10-CM | POA: Diagnosis not present

## 2023-07-12 DIAGNOSIS — M6281 Muscle weakness (generalized): Secondary | ICD-10-CM | POA: Diagnosis not present

## 2023-07-12 DIAGNOSIS — R2689 Other abnormalities of gait and mobility: Secondary | ICD-10-CM | POA: Diagnosis not present

## 2023-07-12 DIAGNOSIS — M5459 Other low back pain: Secondary | ICD-10-CM | POA: Diagnosis not present

## 2023-07-12 NOTE — Assessment & Plan Note (Signed)
 Non recurrent based on exam.  CT chest results are pending

## 2023-07-12 NOTE — Assessment & Plan Note (Addendum)
 She was referred to pulmonology for evaluation of  pleural effusion and underwent thoracentesis of largely transudative fluid.  Exam today is normal.  CT chest was done June 25 but has not been read

## 2023-07-12 NOTE — Assessment & Plan Note (Signed)
 Aggravated by use of caffeine in the evening.  Dietary changes recommended and urged to consider overnight pulse oximetry to address potential nocturnal hypoxemia.

## 2023-07-12 NOTE — Addendum Note (Signed)
 Addended by: MARYLYNN VERNEITA CROME on: 07/12/2023 08:54 PM   Modules accepted: Orders

## 2023-07-12 NOTE — Assessment & Plan Note (Signed)
 Pain is now limited to right buttock.  Advised to return to Dr Major for a second ESI.  Use of tylenol  up to 3000 mg daily recommended

## 2023-07-13 DIAGNOSIS — R2681 Unsteadiness on feet: Secondary | ICD-10-CM | POA: Diagnosis not present

## 2023-07-13 DIAGNOSIS — M5459 Other low back pain: Secondary | ICD-10-CM | POA: Diagnosis not present

## 2023-07-13 DIAGNOSIS — R296 Repeated falls: Secondary | ICD-10-CM | POA: Diagnosis not present

## 2023-07-14 DIAGNOSIS — R2689 Other abnormalities of gait and mobility: Secondary | ICD-10-CM | POA: Diagnosis not present

## 2023-07-14 DIAGNOSIS — J189 Pneumonia, unspecified organism: Secondary | ICD-10-CM | POA: Diagnosis not present

## 2023-07-14 DIAGNOSIS — M6281 Muscle weakness (generalized): Secondary | ICD-10-CM | POA: Diagnosis not present

## 2023-07-14 DIAGNOSIS — R2681 Unsteadiness on feet: Secondary | ICD-10-CM | POA: Diagnosis not present

## 2023-07-14 DIAGNOSIS — Z9181 History of falling: Secondary | ICD-10-CM | POA: Diagnosis not present

## 2023-07-14 DIAGNOSIS — I480 Paroxysmal atrial fibrillation: Secondary | ICD-10-CM | POA: Diagnosis not present

## 2023-07-15 DIAGNOSIS — R2681 Unsteadiness on feet: Secondary | ICD-10-CM | POA: Diagnosis not present

## 2023-07-15 DIAGNOSIS — M5459 Other low back pain: Secondary | ICD-10-CM | POA: Diagnosis not present

## 2023-07-15 DIAGNOSIS — R296 Repeated falls: Secondary | ICD-10-CM | POA: Diagnosis not present

## 2023-07-18 DIAGNOSIS — R2681 Unsteadiness on feet: Secondary | ICD-10-CM | POA: Diagnosis not present

## 2023-07-18 DIAGNOSIS — M5459 Other low back pain: Secondary | ICD-10-CM | POA: Diagnosis not present

## 2023-07-18 DIAGNOSIS — R296 Repeated falls: Secondary | ICD-10-CM | POA: Diagnosis not present

## 2023-07-19 DIAGNOSIS — R2681 Unsteadiness on feet: Secondary | ICD-10-CM | POA: Diagnosis not present

## 2023-07-19 DIAGNOSIS — I480 Paroxysmal atrial fibrillation: Secondary | ICD-10-CM | POA: Diagnosis not present

## 2023-07-19 DIAGNOSIS — Z9181 History of falling: Secondary | ICD-10-CM | POA: Diagnosis not present

## 2023-07-19 DIAGNOSIS — M6281 Muscle weakness (generalized): Secondary | ICD-10-CM | POA: Diagnosis not present

## 2023-07-19 DIAGNOSIS — J189 Pneumonia, unspecified organism: Secondary | ICD-10-CM | POA: Diagnosis not present

## 2023-07-19 DIAGNOSIS — R2689 Other abnormalities of gait and mobility: Secondary | ICD-10-CM | POA: Diagnosis not present

## 2023-07-20 DIAGNOSIS — R2681 Unsteadiness on feet: Secondary | ICD-10-CM | POA: Diagnosis not present

## 2023-07-20 DIAGNOSIS — R296 Repeated falls: Secondary | ICD-10-CM | POA: Diagnosis not present

## 2023-07-20 DIAGNOSIS — M5459 Other low back pain: Secondary | ICD-10-CM | POA: Diagnosis not present

## 2023-07-21 DIAGNOSIS — J189 Pneumonia, unspecified organism: Secondary | ICD-10-CM | POA: Diagnosis not present

## 2023-07-21 DIAGNOSIS — R2681 Unsteadiness on feet: Secondary | ICD-10-CM | POA: Diagnosis not present

## 2023-07-21 DIAGNOSIS — I480 Paroxysmal atrial fibrillation: Secondary | ICD-10-CM | POA: Diagnosis not present

## 2023-07-21 DIAGNOSIS — Z9181 History of falling: Secondary | ICD-10-CM | POA: Diagnosis not present

## 2023-07-21 DIAGNOSIS — M6281 Muscle weakness (generalized): Secondary | ICD-10-CM | POA: Diagnosis not present

## 2023-07-21 DIAGNOSIS — R2689 Other abnormalities of gait and mobility: Secondary | ICD-10-CM | POA: Diagnosis not present

## 2023-07-21 LAB — ACID FAST CULTURE WITH REFLEXED SENSITIVITIES (MYCOBACTERIA): Acid Fast Culture: NEGATIVE

## 2023-07-22 DIAGNOSIS — R296 Repeated falls: Secondary | ICD-10-CM | POA: Diagnosis not present

## 2023-07-22 DIAGNOSIS — M5459 Other low back pain: Secondary | ICD-10-CM | POA: Diagnosis not present

## 2023-07-22 DIAGNOSIS — R2681 Unsteadiness on feet: Secondary | ICD-10-CM | POA: Diagnosis not present

## 2023-07-26 ENCOUNTER — Encounter: Payer: Self-pay | Admitting: Internal Medicine

## 2023-07-26 DIAGNOSIS — Z9181 History of falling: Secondary | ICD-10-CM | POA: Diagnosis not present

## 2023-07-26 DIAGNOSIS — I480 Paroxysmal atrial fibrillation: Secondary | ICD-10-CM | POA: Diagnosis not present

## 2023-07-26 DIAGNOSIS — R2681 Unsteadiness on feet: Secondary | ICD-10-CM | POA: Diagnosis not present

## 2023-07-26 DIAGNOSIS — R2689 Other abnormalities of gait and mobility: Secondary | ICD-10-CM | POA: Diagnosis not present

## 2023-07-26 DIAGNOSIS — M6281 Muscle weakness (generalized): Secondary | ICD-10-CM | POA: Diagnosis not present

## 2023-07-26 DIAGNOSIS — J189 Pneumonia, unspecified organism: Secondary | ICD-10-CM | POA: Diagnosis not present

## 2023-07-27 DIAGNOSIS — M5459 Other low back pain: Secondary | ICD-10-CM | POA: Diagnosis not present

## 2023-07-27 DIAGNOSIS — R296 Repeated falls: Secondary | ICD-10-CM | POA: Diagnosis not present

## 2023-07-27 DIAGNOSIS — R2681 Unsteadiness on feet: Secondary | ICD-10-CM | POA: Diagnosis not present

## 2023-07-28 DIAGNOSIS — I480 Paroxysmal atrial fibrillation: Secondary | ICD-10-CM | POA: Diagnosis not present

## 2023-07-28 DIAGNOSIS — R2681 Unsteadiness on feet: Secondary | ICD-10-CM | POA: Diagnosis not present

## 2023-07-28 DIAGNOSIS — J189 Pneumonia, unspecified organism: Secondary | ICD-10-CM | POA: Diagnosis not present

## 2023-07-28 DIAGNOSIS — R2689 Other abnormalities of gait and mobility: Secondary | ICD-10-CM | POA: Diagnosis not present

## 2023-07-28 DIAGNOSIS — M6281 Muscle weakness (generalized): Secondary | ICD-10-CM | POA: Diagnosis not present

## 2023-07-28 DIAGNOSIS — Z9181 History of falling: Secondary | ICD-10-CM | POA: Diagnosis not present

## 2023-07-29 DIAGNOSIS — R296 Repeated falls: Secondary | ICD-10-CM | POA: Diagnosis not present

## 2023-07-29 DIAGNOSIS — M5459 Other low back pain: Secondary | ICD-10-CM | POA: Diagnosis not present

## 2023-07-29 DIAGNOSIS — R2681 Unsteadiness on feet: Secondary | ICD-10-CM | POA: Diagnosis not present

## 2023-08-01 DIAGNOSIS — R296 Repeated falls: Secondary | ICD-10-CM | POA: Diagnosis not present

## 2023-08-01 DIAGNOSIS — R2681 Unsteadiness on feet: Secondary | ICD-10-CM | POA: Diagnosis not present

## 2023-08-01 DIAGNOSIS — M5459 Other low back pain: Secondary | ICD-10-CM | POA: Diagnosis not present

## 2023-08-04 DIAGNOSIS — M6281 Muscle weakness (generalized): Secondary | ICD-10-CM | POA: Diagnosis not present

## 2023-08-04 DIAGNOSIS — J189 Pneumonia, unspecified organism: Secondary | ICD-10-CM | POA: Diagnosis not present

## 2023-08-04 DIAGNOSIS — R2681 Unsteadiness on feet: Secondary | ICD-10-CM | POA: Diagnosis not present

## 2023-08-04 DIAGNOSIS — Z9181 History of falling: Secondary | ICD-10-CM | POA: Diagnosis not present

## 2023-08-04 DIAGNOSIS — R2689 Other abnormalities of gait and mobility: Secondary | ICD-10-CM | POA: Diagnosis not present

## 2023-08-04 DIAGNOSIS — I480 Paroxysmal atrial fibrillation: Secondary | ICD-10-CM | POA: Diagnosis not present

## 2023-08-05 DIAGNOSIS — M5416 Radiculopathy, lumbar region: Secondary | ICD-10-CM | POA: Diagnosis not present

## 2023-08-05 DIAGNOSIS — M48062 Spinal stenosis, lumbar region with neurogenic claudication: Secondary | ICD-10-CM | POA: Diagnosis not present

## 2023-08-08 DIAGNOSIS — M5459 Other low back pain: Secondary | ICD-10-CM | POA: Diagnosis not present

## 2023-08-08 DIAGNOSIS — R296 Repeated falls: Secondary | ICD-10-CM | POA: Diagnosis not present

## 2023-08-08 DIAGNOSIS — R2681 Unsteadiness on feet: Secondary | ICD-10-CM | POA: Diagnosis not present

## 2023-08-09 ENCOUNTER — Ambulatory Visit: Admitting: Cardiovascular Disease

## 2023-08-09 DIAGNOSIS — J189 Pneumonia, unspecified organism: Secondary | ICD-10-CM | POA: Diagnosis not present

## 2023-08-09 DIAGNOSIS — I480 Paroxysmal atrial fibrillation: Secondary | ICD-10-CM | POA: Diagnosis not present

## 2023-08-09 DIAGNOSIS — R2681 Unsteadiness on feet: Secondary | ICD-10-CM | POA: Diagnosis not present

## 2023-08-09 DIAGNOSIS — R2689 Other abnormalities of gait and mobility: Secondary | ICD-10-CM | POA: Diagnosis not present

## 2023-08-09 DIAGNOSIS — M6281 Muscle weakness (generalized): Secondary | ICD-10-CM | POA: Diagnosis not present

## 2023-08-12 DIAGNOSIS — R2681 Unsteadiness on feet: Secondary | ICD-10-CM | POA: Diagnosis not present

## 2023-08-12 DIAGNOSIS — M5459 Other low back pain: Secondary | ICD-10-CM | POA: Diagnosis not present

## 2023-08-12 DIAGNOSIS — R296 Repeated falls: Secondary | ICD-10-CM | POA: Diagnosis not present

## 2023-08-15 DIAGNOSIS — R2681 Unsteadiness on feet: Secondary | ICD-10-CM | POA: Diagnosis not present

## 2023-08-15 DIAGNOSIS — M5459 Other low back pain: Secondary | ICD-10-CM | POA: Diagnosis not present

## 2023-08-15 DIAGNOSIS — R296 Repeated falls: Secondary | ICD-10-CM | POA: Diagnosis not present

## 2023-08-16 DIAGNOSIS — R2681 Unsteadiness on feet: Secondary | ICD-10-CM | POA: Diagnosis not present

## 2023-08-16 DIAGNOSIS — M6281 Muscle weakness (generalized): Secondary | ICD-10-CM | POA: Diagnosis not present

## 2023-08-16 DIAGNOSIS — J189 Pneumonia, unspecified organism: Secondary | ICD-10-CM | POA: Diagnosis not present

## 2023-08-16 DIAGNOSIS — R2689 Other abnormalities of gait and mobility: Secondary | ICD-10-CM | POA: Diagnosis not present

## 2023-08-16 DIAGNOSIS — I480 Paroxysmal atrial fibrillation: Secondary | ICD-10-CM | POA: Diagnosis not present

## 2023-08-23 DIAGNOSIS — M6281 Muscle weakness (generalized): Secondary | ICD-10-CM | POA: Diagnosis not present

## 2023-08-23 DIAGNOSIS — R2689 Other abnormalities of gait and mobility: Secondary | ICD-10-CM | POA: Diagnosis not present

## 2023-08-23 DIAGNOSIS — J189 Pneumonia, unspecified organism: Secondary | ICD-10-CM | POA: Diagnosis not present

## 2023-08-23 DIAGNOSIS — I480 Paroxysmal atrial fibrillation: Secondary | ICD-10-CM | POA: Diagnosis not present

## 2023-08-23 DIAGNOSIS — R2681 Unsteadiness on feet: Secondary | ICD-10-CM | POA: Diagnosis not present

## 2023-08-26 ENCOUNTER — Other Ambulatory Visit: Payer: Self-pay | Admitting: Family Medicine

## 2023-08-26 DIAGNOSIS — M5416 Radiculopathy, lumbar region: Secondary | ICD-10-CM | POA: Diagnosis not present

## 2023-08-26 DIAGNOSIS — M4807 Spinal stenosis, lumbosacral region: Secondary | ICD-10-CM

## 2023-08-26 DIAGNOSIS — M48062 Spinal stenosis, lumbar region with neurogenic claudication: Secondary | ICD-10-CM | POA: Diagnosis not present

## 2023-08-29 ENCOUNTER — Ambulatory Visit
Admission: RE | Admit: 2023-08-29 | Discharge: 2023-08-29 | Disposition: A | Discharge status: Home / Self Care | Source: Ambulatory Visit | Attending: Family Medicine | Admitting: Family Medicine

## 2023-08-29 DIAGNOSIS — M4807 Spinal stenosis, lumbosacral region: Secondary | ICD-10-CM | POA: Insufficient documentation

## 2023-08-29 DIAGNOSIS — M4316 Spondylolisthesis, lumbar region: Secondary | ICD-10-CM | POA: Diagnosis not present

## 2023-08-29 DIAGNOSIS — M48061 Spinal stenosis, lumbar region without neurogenic claudication: Secondary | ICD-10-CM | POA: Diagnosis not present

## 2023-08-29 DIAGNOSIS — M47816 Spondylosis without myelopathy or radiculopathy, lumbar region: Secondary | ICD-10-CM | POA: Diagnosis not present

## 2023-08-30 DIAGNOSIS — J189 Pneumonia, unspecified organism: Secondary | ICD-10-CM | POA: Diagnosis not present

## 2023-08-30 DIAGNOSIS — Z961 Presence of intraocular lens: Secondary | ICD-10-CM | POA: Diagnosis not present

## 2023-08-30 DIAGNOSIS — I480 Paroxysmal atrial fibrillation: Secondary | ICD-10-CM | POA: Diagnosis not present

## 2023-08-30 DIAGNOSIS — R2689 Other abnormalities of gait and mobility: Secondary | ICD-10-CM | POA: Diagnosis not present

## 2023-08-30 DIAGNOSIS — M6281 Muscle weakness (generalized): Secondary | ICD-10-CM | POA: Diagnosis not present

## 2023-08-30 DIAGNOSIS — H401121 Primary open-angle glaucoma, left eye, mild stage: Secondary | ICD-10-CM | POA: Diagnosis not present

## 2023-08-30 DIAGNOSIS — H353221 Exudative age-related macular degeneration, left eye, with active choroidal neovascularization: Secondary | ICD-10-CM | POA: Diagnosis not present

## 2023-08-30 DIAGNOSIS — R2681 Unsteadiness on feet: Secondary | ICD-10-CM | POA: Diagnosis not present

## 2023-09-06 DIAGNOSIS — J189 Pneumonia, unspecified organism: Secondary | ICD-10-CM | POA: Diagnosis not present

## 2023-09-06 DIAGNOSIS — M6281 Muscle weakness (generalized): Secondary | ICD-10-CM | POA: Diagnosis not present

## 2023-09-06 DIAGNOSIS — R2689 Other abnormalities of gait and mobility: Secondary | ICD-10-CM | POA: Diagnosis not present

## 2023-09-06 DIAGNOSIS — I480 Paroxysmal atrial fibrillation: Secondary | ICD-10-CM | POA: Diagnosis not present

## 2023-09-06 DIAGNOSIS — R2681 Unsteadiness on feet: Secondary | ICD-10-CM | POA: Diagnosis not present

## 2023-09-08 DIAGNOSIS — M1611 Unilateral primary osteoarthritis, right hip: Secondary | ICD-10-CM | POA: Diagnosis not present

## 2023-09-08 DIAGNOSIS — M5416 Radiculopathy, lumbar region: Secondary | ICD-10-CM | POA: Diagnosis not present

## 2023-09-08 DIAGNOSIS — M48062 Spinal stenosis, lumbar region with neurogenic claudication: Secondary | ICD-10-CM | POA: Diagnosis not present

## 2023-09-13 DIAGNOSIS — M6281 Muscle weakness (generalized): Secondary | ICD-10-CM | POA: Diagnosis not present

## 2023-09-13 DIAGNOSIS — R2681 Unsteadiness on feet: Secondary | ICD-10-CM | POA: Diagnosis not present

## 2023-09-13 DIAGNOSIS — J189 Pneumonia, unspecified organism: Secondary | ICD-10-CM | POA: Diagnosis not present

## 2023-09-13 DIAGNOSIS — R2689 Other abnormalities of gait and mobility: Secondary | ICD-10-CM | POA: Diagnosis not present

## 2023-09-13 DIAGNOSIS — I480 Paroxysmal atrial fibrillation: Secondary | ICD-10-CM | POA: Diagnosis not present

## 2023-09-16 DIAGNOSIS — M1611 Unilateral primary osteoarthritis, right hip: Secondary | ICD-10-CM | POA: Diagnosis not present

## 2023-09-20 DIAGNOSIS — J189 Pneumonia, unspecified organism: Secondary | ICD-10-CM | POA: Diagnosis not present

## 2023-09-20 DIAGNOSIS — R2681 Unsteadiness on feet: Secondary | ICD-10-CM | POA: Diagnosis not present

## 2023-09-20 DIAGNOSIS — I480 Paroxysmal atrial fibrillation: Secondary | ICD-10-CM | POA: Diagnosis not present

## 2023-09-20 DIAGNOSIS — R2689 Other abnormalities of gait and mobility: Secondary | ICD-10-CM | POA: Diagnosis not present

## 2023-09-20 DIAGNOSIS — M6281 Muscle weakness (generalized): Secondary | ICD-10-CM | POA: Diagnosis not present

## 2023-09-26 ENCOUNTER — Other Ambulatory Visit: Payer: Self-pay

## 2023-09-26 ENCOUNTER — Emergency Department

## 2023-09-26 ENCOUNTER — Other Ambulatory Visit: Payer: Self-pay | Admitting: *Deleted

## 2023-09-26 ENCOUNTER — Emergency Department
Admission: EM | Admit: 2023-09-26 | Discharge: 2023-09-26 | Disposition: A | Discharge status: Home / Self Care | Attending: Emergency Medicine | Admitting: Emergency Medicine

## 2023-09-26 DIAGNOSIS — I6782 Cerebral ischemia: Secondary | ICD-10-CM | POA: Diagnosis not present

## 2023-09-26 DIAGNOSIS — R609 Edema, unspecified: Secondary | ICD-10-CM | POA: Diagnosis not present

## 2023-09-26 DIAGNOSIS — M47812 Spondylosis without myelopathy or radiculopathy, cervical region: Secondary | ICD-10-CM | POA: Diagnosis not present

## 2023-09-26 DIAGNOSIS — I10 Essential (primary) hypertension: Secondary | ICD-10-CM | POA: Insufficient documentation

## 2023-09-26 DIAGNOSIS — Y92009 Unspecified place in unspecified non-institutional (private) residence as the place of occurrence of the external cause: Secondary | ICD-10-CM

## 2023-09-26 DIAGNOSIS — Z043 Encounter for examination and observation following other accident: Secondary | ICD-10-CM | POA: Diagnosis not present

## 2023-09-26 DIAGNOSIS — G4489 Other headache syndrome: Secondary | ICD-10-CM | POA: Diagnosis not present

## 2023-09-26 DIAGNOSIS — I672 Cerebral atherosclerosis: Secondary | ICD-10-CM | POA: Diagnosis not present

## 2023-09-26 DIAGNOSIS — W19XXXA Unspecified fall, initial encounter: Secondary | ICD-10-CM | POA: Diagnosis not present

## 2023-09-26 DIAGNOSIS — M542 Cervicalgia: Secondary | ICD-10-CM | POA: Diagnosis not present

## 2023-09-26 DIAGNOSIS — M810 Age-related osteoporosis without current pathological fracture: Secondary | ICD-10-CM

## 2023-09-26 DIAGNOSIS — S0990XA Unspecified injury of head, initial encounter: Secondary | ICD-10-CM | POA: Diagnosis not present

## 2023-09-26 DIAGNOSIS — M5030 Other cervical disc degeneration, unspecified cervical region: Secondary | ICD-10-CM | POA: Diagnosis not present

## 2023-09-26 DIAGNOSIS — M4312 Spondylolisthesis, cervical region: Secondary | ICD-10-CM | POA: Diagnosis not present

## 2023-09-26 MED ORDER — ACETAMINOPHEN 500 MG PO TABS
1000.0000 mg | ORAL_TABLET | Freq: Once | ORAL | Status: AC
  Administered 2023-09-26: 1000 mg via ORAL
  Filled 2023-09-26: qty 2

## 2023-09-26 MED ORDER — DENOSUMAB-BBDZ 60 MG/ML ~~LOC~~ SOSY: 60.0000 mg | PREFILLED_SYRINGE | SUBCUTANEOUS | 1 refills | Status: DC

## 2023-09-26 NOTE — ED Triage Notes (Signed)
 Pt to ED via EMS from [facility] [place] [place], pt has neuropathy in feet and lost her balance fell striking her head on the floor, pt has lump to the back of her head and c/o neck pain. Pt is not on blood thinners, did not have LOC

## 2023-09-26 NOTE — Telephone Encounter (Signed)
 I have reached out to patient after coming across her chart. She missed her Proli last month because the order was cancelled in May. Pt states that she should still be on Prolia  & it has helped because she has fallena few times and she had no injuries.  While I had her on the phone I notified her that she will be changing over to Jubbonti  with her next injection & that I will provide more detail via mychart.  Order pended for approval & See cancelled order below:  Ordered Dose: 60 mg Route: Subcutaneous Frequency:  Once  Duration: 1 days Dispense As Written: No    Admin Dose: 60 mg     Scheduled Start Date/Time: 08/25/23 0000 End Date/Time: 05/25/23 1844 (ordered for 1 doses)     Admin Instructions:  Please verify that patient has been taking calcium  and vitamin D  supplement and CMET within the last 12 months. Do NOT give if calcium  low; contact MD and/or pharmacist to evaluate. Provide patient medication guide if has not received previously.  For OUTPATIENT use only. Administer in the upper arm, upper thigh, or abdomen.         Associated Diagnoses: Age-related osteoporosis without current pathological fracture [M81.0]     Order Status: Discontinued    NF Ordered: Yes NF post-verification: No Non-Formulary Code: Acceptable to use formulary alternative  Ordering User: [place], Jenate, CMA Ordering Date/Time: Fri Feb 25, 2023 1420  Ordering Provider: Maribeth Camellia MATSU, MD Authorizing Provider: Maribeth Camellia MATSU, MD  D/C User: Trudy Anthony HERO, MD D/C Date/Time: Wed May 25, 2023 1844  D/C Reason: Stop Taking at Discharge D/C Verified By: Trudy Anthony HERO, MD (Wed May 25, 2023 1844)    Order Questions  Question Answer Comment  Patient is enrolled in [employer] program for this medication and I have provided a copy of the Prolia  Medication Guide and Patient Brochure. Yes   I have reviewed with the patient the information in the Prolia  Medication Guide and Patient Counseling Chart including  the serious risks of Prolia  and symptoms of each risk. Yes   I have advised the patient to seek medical attention if they have signs or symptoms of any of the serious risks. Yes

## 2023-09-26 NOTE — ED Provider Notes (Signed)
 [facility] Emergency Department Provider Note     Event Date/Time   First MD Initiated Contact with Patient 09/26/23 2119     (approximate)   History   Fall   HPI  Gloria Carr is a 89 y.o. female with a history of arthritis, glaucoma, spinal stenosis, and HTN, presents to the ED accompanied by her son.  Patient who is a resident of the villages at [facility], lost her balance, resulting in a fall and a head strike on the floor secondary to mechanical fall.  She presents with small hematoma to the posterior head but no reports of any LOC, nausea, vomiting, or weakness.  Patient denies any blood thinner use.  She is endorsing some neck muscle stiffness bilaterally.  No distal paresthesia reported.  No other injury reported at this time.  Physical Exam   Triage Vital Signs: ED Triage Vitals  Encounter Vitals Group     BP 09/26/23 2003 (!) 186/91     Girls Systolic BP Percentile --      Girls Diastolic BP Percentile --      Boys Systolic BP Percentile --      Boys Diastolic BP Percentile --      Pulse Rate 09/26/23 2003 73     Resp 09/26/23 2003 18     Temp 09/26/23 2003 97.7 F (36.5 C)     Temp Source 09/26/23 2003 Oral     SpO2 09/26/23 2003 96 %     Weight 09/26/23 2002 95 lb (43.1 kg)     Height 09/26/23 2002 4' 10 (1.473 m)     Head Circumference --      Peak Flow --      Pain Score 09/26/23 2002 10     Pain Loc --      Pain Education --      Exclude from Growth Chart --     Most recent vital signs: Vitals:   09/26/23 2003 09/26/23 2120  BP: (!) 186/91 (!) 192/86  Pulse: 73 77  Resp: 18 17  Temp: 97.7 F (36.5 C) 97.7 F (36.5 C)  SpO2: 96% 100%    General Awake, no distress.  NAD HEENT NCAT.  No abrasions, lacerations, or skin tears noted.  PERRL. EOMI. No rhinorrhea. Mucous membranes are moist.  CV:  Good peripheral perfusion.  RESP:  Normal effort.  ABD:  No distention.  MSK:  AROM of all extremities.  No midline  tenderness to palpation of the cervical, thoracic, or lumbar spine. NEURO: Cranial nerves II to XII grossly intact.   ED Results / Procedures / Treatments   Labs (all labs ordered are listed, but only abnormal results are displayed) Labs Reviewed - No data to display   EKG   RADIOLOGY  I personally viewed and evaluated these images as part of my medical decision making, as well as reviewing the written report by the radiologist.  ED Provider Interpretation: No acute findings  CT Cervical Spine Wo Contrast Result Date: 09/26/2023 CLINICAL DATA:  Status post fall striking head on floor. EXAM: CT CERVICAL SPINE WITHOUT CONTRAST TECHNIQUE: Multidetector CT imaging of the cervical spine was performed without intravenous contrast. Multiplanar CT image reconstructions were also generated. RADIATION DOSE REDUCTION: This exam was performed according to the departmental dose-optimization program which includes automated exposure control, adjustment of the mA and/or kV according to patient size and/or use of iterative reconstruction technique. COMPARISON:  Cervical spine MRI 10/12/2017 FINDINGS: Alignment: No traumatic subluxation. Chronic  grade 1 anterolisthesis of C5 on C6 and C7 on T1. Skull base and vertebrae: No acute fracture. Erosive change involving the dens with marked thickening of the transverse ligament of the atlas eccentric to the left is not significantly changed from prior MRI allowing for differences in modality. Soft tissues and spinal canal: Thickening of the transverse ligament of the at list eccentric to the left causes mass effect and displacement of the spinal cord, not significantly changed from prior MRI allowing for differences in modality. No evidence of canal hematoma. No prevertebral soft tissue thickening. Disc levels: Moderate diffuse degenerative disc disease and facet hypertrophy. Upper chest: No acute findings. Chronic right shoulder arthropathy is partially included in  the field of view. Other: None. IMPRESSION: 1. No acute fracture or traumatic subluxation of the cervical spine. 2. Erosive change involving the dens with marked thickening of the transverse ligament of the atlantoaxial joint eccentric to the left, not significantly changed from prior MRI allowing for differences in modality. 3. Moderate diffuse degenerative disc disease and facet hypertrophy. Electronically Signed   By: Andrea Gasman M.D.   On: 09/26/2023 21:07   CT Head Wo Contrast Result Date: 09/26/2023 CLINICAL DATA:  89 year old post fall. Lost balance striking head on the floor. EXAM: CT HEAD WITHOUT CONTRAST TECHNIQUE: Contiguous axial images were obtained from the base of the skull through the vertex without intravenous contrast. RADIATION DOSE REDUCTION: This exam was performed according to the departmental dose-optimization program which includes automated exposure control, adjustment of the mA and/or kV according to patient size and/or use of iterative reconstruction technique. COMPARISON:  Brain MRI 09/21/2017 FINDINGS: Brain: No intracranial hemorrhage, mass effect, or midline shift. Age related atrophy. No hydrocephalus. There is again noted to be soft tissue thickening at the craniocervical junction causing mass effect on the upper cervical spine which is displaced posteriorly and to the right. Allowing for differences in modality, this is not significantly changed from prior MRI. Moderate periventricular and deep white matter hypodensity typical of chronic small vessel ischemia. No evidence of territorial infarct or acute ischemia. No extra-axial or intracranial fluid collection. Vascular: Atherosclerosis of skullbase vasculature without hyperdense vessel or abnormal calcification. Skull: No fracture or focal lesion. Sinuses/Orbits: No acute finding. Other: Mild soft tissue thickening the posterior occipital scalp IMPRESSION: 1. No acute intracranial abnormality. No skull fracture. 2. Age  related atrophy and chronic small vessel ischemia. 3. Soft tissue thickening at the craniocervical junction causing mass effect on the upper cervical spine which is displaced posteriorly and to the right. Allowing for differences in modality, this is not significantly changed from 2019 MRI. Electronically Signed   By: Andrea Gasman M.D.   On: 09/26/2023 21:02     PROCEDURES:  Critical Care performed: No  Procedures   MEDICATIONS ORDERED IN ED: Medications  acetaminophen  (TYLENOL ) tablet 1,000 mg (1,000 mg Oral Given 09/26/23 2148)     IMPRESSION / MDM / ASSESSMENT AND PLAN / ED COURSE  I reviewed the triage vital signs and the nursing notes.                              Differential diagnosis includes, but is not limited to, SDH, skull fracture, cervical fracture, cervical radiculopathy, abrasion, laceration, contusion, myalgia  Patient's presentation is most consistent with acute complicated illness / injury requiring diagnostic workup.  Patient's diagnosis is consistent with mechanical fall resulting in a minor head injury without LOC.  Geriatric patient  presents after mechanical fall, accompanied by her adult son.  She denies any LOC, weakness, paralysis, or vision change.  Patient is endorsing some neck pain on evaluation.  CT images interpreted by me, of the head and cervical spine, are negative for any acute findings.  Patient otherwise with reassuring exam with no indication of scalp laceration or injury at this time.  Patient will be discharged home with instructions to take OTC Tylenol  as needed. Patient is to follow up with her PCP as needed or otherwise directed. Patient is given ED precautions to return to the ED for any worsening or new symptoms.   FINAL CLINICAL IMPRESSION(S) / ED DIAGNOSES   Final diagnoses:  Fall in home, initial encounter  Minor head injury, initial encounter  Neck pain, acute     Rx / DC Orders   ED Discharge Orders     None         Note:  This document was prepared using Dragon voice recognition software and may include unintentional dictation errors.    Loyd Candida LULLA Aldona, PA-C 09/26/23 2243    Levander Slate, MD 09/26/23 [phone]

## 2023-09-26 NOTE — Discharge Instructions (Signed)
 Your exam as well as your head and neck CT scan are normal and reassuring.  No signs of a serious closed head injury or bleeding.  No evidence of any spinal cord injury or neck fracture.  Take OTC Tylenol  as needed.  You can expect to be sore and stiff for the next few days.  Follow-up with your primary provider for ongoing evaluation.

## 2023-09-27 ENCOUNTER — Other Ambulatory Visit (HOSPITAL_COMMUNITY): Payer: Self-pay

## 2023-09-27 ENCOUNTER — Telehealth: Payer: Self-pay

## 2023-09-27 DIAGNOSIS — M6281 Muscle weakness (generalized): Secondary | ICD-10-CM | POA: Diagnosis not present

## 2023-09-27 DIAGNOSIS — R2689 Other abnormalities of gait and mobility: Secondary | ICD-10-CM | POA: Diagnosis not present

## 2023-09-27 DIAGNOSIS — I480 Paroxysmal atrial fibrillation: Secondary | ICD-10-CM | POA: Diagnosis not present

## 2023-09-27 DIAGNOSIS — R2681 Unsteadiness on feet: Secondary | ICD-10-CM | POA: Diagnosis not present

## 2023-09-27 DIAGNOSIS — J189 Pneumonia, unspecified organism: Secondary | ICD-10-CM | POA: Diagnosis not present

## 2023-09-27 NOTE — Telephone Encounter (Signed)
 Pt ready for scheduling for JUBBONTI  on or after : 09/27/23   Option# 2- Med Obtained from pharmacy:   Pharmacy benefit: Copay $250 (Paid to pharmacy) Admin Fee: 0% (Pay at clinic)   Prior Auth: N/A PA# Expiration Date:   # of doses approved:     If patient wants fill through the pharmacy benefit please send prescription to: [facility], and include estimated need by date in rx notes. Pharmacy will ship medication directly to the office.

## 2023-09-27 NOTE — Telephone Encounter (Signed)
 Pt ready for scheduling for JUBBONTI  on or after : 09/27/23  Option# 2- Med Obtained from pharmacy:  Pharmacy benefit: Copay $250 (Paid to pharmacy) Admin Fee: 0% (Pay at clinic)  Prior Auth: N/A PA# Expiration Date:   # of doses approved:   If patient wants fill through the pharmacy benefit please send prescription to: [facility], and include estimated need by date in rx notes. Pharmacy will ship medication directly to the office.

## 2023-09-30 ENCOUNTER — Emergency Department

## 2023-09-30 ENCOUNTER — Emergency Department
Admission: EM | Admit: 2023-09-30 | Discharge: 2023-09-30 | Disposition: A | Discharge status: Home / Self Care | Source: Ambulatory Visit

## 2023-09-30 ENCOUNTER — Other Ambulatory Visit: Payer: Self-pay

## 2023-09-30 DIAGNOSIS — W19XXXD Unspecified fall, subsequent encounter: Secondary | ICD-10-CM | POA: Insufficient documentation

## 2023-09-30 DIAGNOSIS — I10 Essential (primary) hypertension: Secondary | ICD-10-CM | POA: Insufficient documentation

## 2023-09-30 DIAGNOSIS — R519 Headache, unspecified: Secondary | ICD-10-CM | POA: Insufficient documentation

## 2023-09-30 DIAGNOSIS — I6782 Cerebral ischemia: Secondary | ICD-10-CM | POA: Diagnosis not present

## 2023-09-30 DIAGNOSIS — G319 Degenerative disease of nervous system, unspecified: Secondary | ICD-10-CM | POA: Diagnosis not present

## 2023-09-30 NOTE — ED Triage Notes (Signed)
 First nurse note: pt to ED from [facility] for fall on 9/15. Reports she had another fall immediately after discharged from ED for fall. +h/a intermittently

## 2023-09-30 NOTE — ED Provider Notes (Signed)
 [facility] Provider Note    Event Date/Time   First MD Initiated Contact with Patient 09/30/23 1627     (approximate)   History   Headache  First nurse note: pt to ED from [facility] for fall on 9/15. Reports she had another fall immediately after discharged from ED for fall. +h/a intermittently  Patient sent over from [facility] for headaches since two falls on 09/26/23.   HPI Gloria Carr is a 89 y.o. female   PMH hypertension, hyperlipidemia, atrial fibrillation not on anticoagulation presents for evaluation after a fall - Patient is accompanied by her son who provides collateral.  Says patient had a mechanical fall at her facility on Sunday, was brought to emergency department for eval, negative imaging at that time.  Brings her back today because she had another fall in the same day after returning, apparently her walker got stuck on a rug and she hit her head on a door and then fell down requiring assistance from the facility.  Has had no other falls since then, has been acting her usual self with no infectious symptoms and no recurrent falls.  Has continued to have some posterior headaches however so brings patient back for evaluation of this given her second fall.      Physical Exam   Triage Vital Signs: ED Triage Vitals  Encounter Vitals Group     BP 09/30/23 1453 123/66     Girls Systolic BP Percentile --      Girls Diastolic BP Percentile --      Boys Systolic BP Percentile --      Boys Diastolic BP Percentile --      Pulse Rate 09/30/23 1453 (!) 53     Resp 09/30/23 1453 20     Temp 09/30/23 1453 (!) 97.3 F (36.3 C)     Temp Source 09/30/23 1453 Oral     SpO2 09/30/23 1453 96 %     Weight 09/30/23 1456 96 lb (43.5 kg)     Height 09/30/23 1456 4' 8 (1.422 m)     Head Circumference --      Peak Flow --      Pain Score 09/30/23 1455 5     Pain Loc --      Pain Education --      Exclude from Growth Chart --     Most recent vital signs: Vitals:    09/30/23 1453  BP: 123/66  Pulse: (!) 53  Resp: 20  Temp: (!) 97.3 F (36.3 C)  SpO2: 96%     General: Awake, no distress.  HEENT: Normocephalic, atraumatic, no midline neck pain, full range of motion of neck without difficulty CV:  Good peripheral perfusion. RRR, RP 2+ Resp:  Normal effort. CTAB Abd:  No distention.  Neuro:  Moving all extremities spontaneously, face symmetric, no focal motor deficit appreciated   ED Results / Procedures / Treatments   Labs (all labs ordered are listed, but only abnormal results are displayed) Labs Reviewed - No data to display   EKG  N/a   RADIOLOGY Radiology interpreted by myself radiology reports reviewed.  No acute pathology identified.    PROCEDURES:  Critical Care performed: No  Procedures   MEDICATIONS ORDERED IN ED: Medications - No data to display   IMPRESSION / MDM / ASSESSMENT AND PLAN / ED COURSE  I reviewed the triage vital signs and the nursing notes.  DDX/MDM/AP: Differential diagnosis includes, but is not limited to, consider intracranial hemorrhage or skull fracture or C-spine fracture from subsequent fall after return home.  Otherwise suspect benign headache.  Consider possibility of underlying anemia, electrolyte abnormality, UTI, arrhythmia though patient does give a story of mechanical fall and I am reassured that she has not had any recurrent falls in the past 4 days.  Plan: - CT head and C-spine performed at triage, unremarkable - Offered basic screening labs, EKG --family declines, notes she has been in her usual state of health, prefers discharge home which is not unreasonable.  Does follow-up closely with her outpatient primary care provider--recommend follow-up closely in outpatient setting.  ED return precautions in place.  Patient's presentation is most consistent with acute presentation with potential threat to life or bodily function.  The patient is on the cardiac  monitor to evaluate for evidence of arrhythmia and/or significant heart rate changes.  ED course below.   Clinical Course as of 09/30/23 1737  Fri Sep 30, 2023  1724 CTH, CTSpine: IMPRESSION: 1. No evidence of acute intracranial abnormality or cervical spine fracture. 2. Moderate chronic small vessel ischemic disease. 3. Unchanged retro-odontoid pseudotumor.   [MM]    Clinical Course User Index [MM] Clarine Ozell LABOR, MD     FINAL CLINICAL IMPRESSION(S) / ED DIAGNOSES   Final diagnoses:  Nonintractable headache, unspecified chronicity pattern, unspecified headache type  Fall, subsequent encounter     Rx / DC Orders   ED Discharge Orders     None        Note:  This document was prepared using Dragon voice recognition software and may include unintentional dictation errors.   Clarine Ozell LABOR, MD 09/30/23 [phone]

## 2023-09-30 NOTE — ED Triage Notes (Signed)
 Patient sent over from [facility] for headaches since two falls on 09/26/23.

## 2023-09-30 NOTE — Discharge Instructions (Signed)
 Your evaluation in the emergency department was overall reassuring, And we saw no evidence of underlying traumatic injuries from your recent falls.  You can continue to use Tylenol  as needed for any ongoing discomfort.  Please do follow-up with your primary care provider for reevaluation, and return to the emergency department with any new or worsening symptoms.

## 2023-10-03 ENCOUNTER — Other Ambulatory Visit: Payer: Self-pay

## 2023-10-03 ENCOUNTER — Emergency Department
Admission: EM | Admit: 2023-10-03 | Discharge: 2023-10-03 | Disposition: A | Discharge status: Home / Self Care | Attending: Emergency Medicine | Admitting: Emergency Medicine

## 2023-10-03 ENCOUNTER — Emergency Department

## 2023-10-03 DIAGNOSIS — S0990XA Unspecified injury of head, initial encounter: Secondary | ICD-10-CM | POA: Diagnosis not present

## 2023-10-03 DIAGNOSIS — W01198A Fall on same level from slipping, tripping and stumbling with subsequent striking against other object, initial encounter: Secondary | ICD-10-CM | POA: Diagnosis not present

## 2023-10-03 DIAGNOSIS — S199XXA Unspecified injury of neck, initial encounter: Secondary | ICD-10-CM | POA: Diagnosis not present

## 2023-10-03 DIAGNOSIS — S0003XA Contusion of scalp, initial encounter: Secondary | ICD-10-CM | POA: Insufficient documentation

## 2023-10-03 DIAGNOSIS — W19XXXA Unspecified fall, initial encounter: Secondary | ICD-10-CM

## 2023-10-03 DIAGNOSIS — M4802 Spinal stenosis, cervical region: Secondary | ICD-10-CM | POA: Insufficient documentation

## 2023-10-03 DIAGNOSIS — M50221 Other cervical disc displacement at C4-C5 level: Secondary | ICD-10-CM | POA: Diagnosis not present

## 2023-10-03 DIAGNOSIS — R519 Headache, unspecified: Secondary | ICD-10-CM | POA: Diagnosis not present

## 2023-10-03 DIAGNOSIS — M47812 Spondylosis without myelopathy or radiculopathy, cervical region: Secondary | ICD-10-CM | POA: Diagnosis not present

## 2023-10-03 LAB — BASIC METABOLIC PANEL WITH GFR
Anion gap: 9 (ref 5–15)
BUN: 30 mg/dL — ABNORMAL HIGH (ref 8–23)
CO2: 27 mmol/L (ref 22–32)
Calcium: 8.8 mg/dL — ABNORMAL LOW (ref 8.9–10.3)
Chloride: 100 mmol/L (ref 98–111)
Creatinine, Ser: 0.71 mg/dL (ref 0.44–1.00)
GFR, Estimated: >60 mL/min (ref >60)
Glucose, Bld: 220 mg/dL — ABNORMAL HIGH (ref 70–99)
Potassium: 3.8 mmol/L (ref 3.5–5.1)
Sodium: 136 mmol/L (ref 135–145)

## 2023-10-03 LAB — CBC
HCT: 42.7 % (ref 36.0–46.0)
Hemoglobin: 13.7 g/dL (ref 12.0–15.0)
MCH: 28.2 pg (ref 26.0–34.0)
MCHC: 32.1 g/dL (ref 30.0–36.0)
MCV: 87.9 fL (ref 80.0–100.0)
Platelets: 282 K/uL (ref 150–400)
RBC: 4.86 MIL/uL (ref 3.87–5.11)
RDW: 17.5 % — ABNORMAL HIGH (ref 11.5–15.5)
WBC: 6.5 K/uL (ref 4.0–10.5)
nRBC: 0 % (ref 0.0–0.2)

## 2023-10-03 MED ORDER — LIDOCAINE 5 % EX PTCH
1.0000 | MEDICATED_PATCH | Freq: Once | CUTANEOUS | Status: DC
  Administered 2023-10-03: 1 via TRANSDERMAL
  Filled 2023-10-03: qty 1

## 2023-10-03 NOTE — Discharge Instructions (Addendum)
 You are seen in the emergency department following a fall.  You had a CT scan of your head that did not show any bleeding.  You had a CT scan of your neck that showed significant arthritis in your neck with findings of severe stenosis and spinal cord displacement to the right.  This looked to be present on the MRI you had done of your neck in 2019.  Call and follow-up as an outpatient with spine surgery Dr. Katrina.  Return to the emergency department if you have any weakness or numbness in your arms or legs.  You can use a Lidoderm  patch to your neck and leave on for 12 hours.  You can get these over-the-counter at [facility] or [facility].  Pain control:  Ibuprofen (motrin/aleve/advil) - You can take 3 tablets (600 mg) every 6 hours as needed for pain/fever.  Acetaminophen  (tylenol ) - You can take 2 extra strength tablets (1000 mg) every 6 hours as needed for pain/fever.  You can alternate these medications or take them together.  Make sure you eat food/drink water when taking these medications.  Return to the emergency department for any worsening symptoms or new falls.  It is importantly call and follow-up closely with your primary care physician.

## 2023-10-03 NOTE — ED Notes (Signed)
 See triage note. Presents s/p fall   States she was leaning to remove clothes from the dryer  Her feet gave out. Hx of neuropathy   Hit head on wall

## 2023-10-03 NOTE — ED Triage Notes (Signed)
 Pt comes with fall. Pt states she was reaching in her dryer and fell backwards. Pt states constant pain and hit the back of her head. Pt not on thinner. Pt was just seen here on the 19 for another fall also.

## 2023-10-03 NOTE — ED Provider Notes (Signed)
 [facility] Provider Note    Event Date/Time   First MD Initiated Contact with Patient 10/03/23 1122     (approximate)   History   Fall   HPI  Gloria Carr is a 89 y.o. female past medical history significant for atrial fibrillation not on anticoagulation, history of peripheral neuropathy, degenerative disc disease, who presents to the emergency department following a fall.  Patient states that she had had a couple of falls recently.  Today had a fall and hit the back of her head.  States that she has frequent falls secondary to having neuropathy.  Not on any anticoagulation.  Complaining of pain to her head.  Denies any significant neck pain.  Denies any new numbness or weakness in her extremities.  No chest pain or shortness of breath.  Denies dysuria, urinary urgency or frequency.  Here with her husband.     Physical Exam   Triage Vital Signs: ED Triage Vitals [10/03/23 1109]  Encounter Vitals Group     BP 132/69     Girls Systolic BP Percentile      Girls Diastolic BP Percentile      Boys Systolic BP Percentile      Boys Diastolic BP Percentile      Pulse Rate 68     Resp 18     Temp 98 F (36.7 C)     Temp src      SpO2 100 %     Weight 96 lb (43.5 kg)     Height 4' 8 (1.422 m)     Head Circumference      Peak Flow      Pain Score 9     Pain Loc      Pain Education      Exclude from Growth Chart     Most recent vital signs: Vitals:   10/03/23 1109  BP: 132/69  Pulse: 68  Resp: 18  Temp: 98 F (36.7 C)  SpO2: 100%    Physical Exam Constitutional:      Appearance: She is well-developed.  HENT:     Head:     Comments: Hematoma to the occipital scalp with underlying tenderness to palpation Eyes:     Conjunctiva/sclera: Conjunctivae normal.  Cardiovascular:     Rate and Rhythm: Regular rhythm.  Pulmonary:     Effort: No respiratory distress.  Abdominal:     General: There is no distension.     Tenderness: There is no  abdominal tenderness.  Musculoskeletal:        General: Normal range of motion.     Cervical back: Normal range of motion. No tenderness.     Right lower leg: No edema.     Left lower leg: No edema.  Skin:    General: Skin is warm.     Capillary Refill: Capillary refill takes less than 2 seconds.  Neurological:     General: No focal deficit present.     Mental Status: She is alert. Mental status is at baseline.     Comments: No midline thoracic or lumbar tenderness to palpation     IMPRESSION / MDM / ASSESSMENT AND PLAN / ED COURSE  I reviewed the triage vital signs and the nursing notes.  Differential diagnosis including electrolyte abnormality, dehydration, urinary tract infection, intracranial hemorrhage, postconcussive syndrome, cervical spine fracture.  Have a low suspicion for basilar skull fracture or ligamentous injury.  On chart review not on anticoagulation.  Frequent falls over  the past couple of weeks.   No tachycardic or bradycardic dysrhythmias while on cardiac telemetry.  RADIOLOGY  CT scan of head with no acute findings.  CT scan of the cervical spine with no acute findings.  Noted chronic large calcified soft tissue mass/pseudotumor to the odontoid process with chronic posterior osseous erosion and severe central spinal stenosis.  Stated that the appearance appeared similar to prior imaging.    LABS (all labs ordered are listed, but only abnormal results are displayed) Labs interpreted as -    Labs Reviewed  CBC - Abnormal; Notable for the following components:      Result Value   RDW 17.5 (*)    All other components within normal limits  BASIC METABOLIC PANEL WITH GFR - Abnormal; Notable for the following components:   Glucose, Bld 220 (*)    BUN 30 (*)    Calcium  8.8 (*)    All other components within normal limits  URINALYSIS, W/ REFLEX TO CULTURE (INFECTION SUSPECTED)     MDM  CT scan of the cervical spine obtained today with abnormal  findings.  Reviewed MRI from 2019 but also demonstrated similar findings with cord displacement to the right.  Patient without significant cervical spine tenderness at this time.  Main complaint is a headache from the fall and does have obvious head trauma.  CT scan of the head without signs of intracranial hemorrhage.  Low suspicion for ligamentous injury.  Lab work overall at her baseline.  Discussed imaging results with the patient and husband at bedside.  They were unable to call the MRI from 2019.  Given information to discuss follow-up as an outpatient with spine surgery.  Discussed return to the emergency department for any ongoing or worsening symptoms.  Discussed Lidoderm  patches, Tylenol  for pain control.     PROCEDURES:  Critical Care performed: No  Procedures  Patient's presentation is most consistent with acute presentation with potential threat to life or bodily function.   MEDICATIONS ORDERED IN ED: Medications  lidocaine  (LIDODERM ) 5 % 1 patch (1 patch Transdermal Patch Applied 10/03/23 1446)    FINAL CLINICAL IMPRESSION(S) / ED DIAGNOSES   Final diagnoses:  Fall, initial encounter  Injury of head, initial encounter  Cervical stenosis of spine     Rx / DC Orders   ED Discharge Orders     None        Note:  This document was prepared using Dragon voice recognition software and may include unintentional dictation errors.   Suzanne Kirsch, MD 10/03/23 1531

## 2023-10-04 DIAGNOSIS — R2689 Other abnormalities of gait and mobility: Secondary | ICD-10-CM | POA: Diagnosis not present

## 2023-10-04 DIAGNOSIS — J189 Pneumonia, unspecified organism: Secondary | ICD-10-CM | POA: Diagnosis not present

## 2023-10-04 DIAGNOSIS — M6281 Muscle weakness (generalized): Secondary | ICD-10-CM | POA: Diagnosis not present

## 2023-10-04 DIAGNOSIS — I480 Paroxysmal atrial fibrillation: Secondary | ICD-10-CM | POA: Diagnosis not present

## 2023-10-04 DIAGNOSIS — R2681 Unsteadiness on feet: Secondary | ICD-10-CM | POA: Diagnosis not present

## 2023-10-05 NOTE — Telephone Encounter (Unsigned)
 Copied from CRM [id number]. Topic: General - Other >> Oct 05, 2023  9:15 AM Thersia BROCKS wrote: Reason for CRM: Patient called in regarding a missed call from the office, would like for someone to give her a callback

## 2023-10-07 DIAGNOSIS — M48062 Spinal stenosis, lumbar region with neurogenic claudication: Secondary | ICD-10-CM | POA: Diagnosis not present

## 2023-10-07 DIAGNOSIS — M1611 Unilateral primary osteoarthritis, right hip: Secondary | ICD-10-CM | POA: Diagnosis not present

## 2023-10-07 DIAGNOSIS — M5416 Radiculopathy, lumbar region: Secondary | ICD-10-CM | POA: Diagnosis not present

## 2023-10-07 NOTE — Progress Notes (Signed)
 HPI The patient is a pleasant 89 year old female who presents today for follow-up of acute on chronic low back pain with radiation into the bilateral posterior thighs and posterior calves.  Her symptoms began over 4 years ago and she describes periodic flares of pain.  In 2018 she fell while line dancing and fractured her pubic ramus.  Her pain is often worsened with standing, walking, and prolonged sitting.  She has some relief with moving around.  MRI dated 12/06/2016 demonstrated at L4-5 a 6 mm anterior listhesis, severe facet arthritis and severe central stenosis amongst other findings.  Medications have included Tylenol  650 mg 3 times daily as needed (mild relief), Aleve (no relief), tramadol  50 mg 3 times daily as needed (mild relief).  She has been followed at [facility].  On 07/02/2022 she underwent a L5-S1 interlaminar ESI without relief.  [facility] notes indicate that subsequent to that she underwent MBB to the bilateral L4-5 and L5-S1 facet joints (no relief).  She lives at the [facility] [place].  She has 6 children and presents today with her son Garrel.  MRI of the lumbar spine revealed severe stenosis at L4-5 among other findings.  She was last evaluated on 09/08/2023 at which time she was experiencing pain in the right buttock worsened with rotation of the right hip.  She was to continue with Tylenol  and continue with physical therapy and Occupational Therapy and an x-ray of the right hip was obtained.  She scheduled for right hip joint injection  She was evaluated on 10/03/2023 in the ER after falling and hitting her head on the wall when getting close out of the dryer.  CT of the head was without acute findings.  At today's visit she describes 100% relief following right hip joint injection.  She is very happy with her level of pain relief.  She has been evaluated in the ER on 922, 919 and 915.  She states that her walker is getting caught in the carpet.  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  She is planning to meet with Dr. Clois.  She had an MRI of the cervical spine in 2019 which also revealed slight deflection of the cord to the right related to pseudotumor.  Patient states that she feels in her normal state of health at this time.  Denies any difficulty of fine motor tasks that is unusual for patient.  Denies any changes of gait imbalance.  She presents today with her son.  She has been working with physical therapy 3 time per week.   The [place]  Narcotic Database was reviewed today.  Procedures: 09/16/2023: Right hip injection (100% relief) 08/05/2023: Right L5-S1 and right S1 transforaminal ESI no relief 02/03/2023: Bilateral S1 transforaminal ESI (resolution of her left leg pain, 50% relief of her right leg pain) 07/02/2022: L5-S1 interlaminar ESI ([facility], no relief) 04/23/2019: Bilateral S1 transforaminal ESI (moderate relief) 03/14/2019: Bilateral S1 transforaminal ESI (60% relief)  Past Medical History:  Diagnosis Date  . Arthritis   . CHF (congestive heart failure) (CMS/HHS-HCC)   . Closed fracture of left superior pubic ramus (CMS/HHS-HCC)   . COPD (chronic obstructive pulmonary disease) (CMS/HHS-HCC)   . GERD (gastroesophageal reflux disease)   . Glaucoma   . History of chickenpox   . History of recurrent UTIs   . Hypertension   . Hypothyroidism   . Macular degeneration   . Neuropathy   . Osteoporosis   .  Overactive bladder   . Pure hypercholesterolemia   . Vitamin D  deficiency     Past Surgical History:  Procedure Laterality Date  . CATARACT EXTRACTION Bilateral 2013  . ENDOSCOPIC CARPAL TUNNEL RELEASE Bilateral 1999-2000    Social History   Socioeconomic History  . Marital status: Widowed  Tobacco Use  . Smoking status: Former    Current packs/day: 0.00    Types: Cigarettes    Quit date: 1960    Years since quitting: 65.7  . Smokeless tobacco: Never   Vaping Use  . Vaping status: Never Used  Substance and Sexual Activity  . Alcohol use: Yes    Comment: 1 glass of wine or gin & tonic per day  . Drug use: Not Currently  . Sexual activity: Not Currently   Social Drivers of Health   Financial Resource Strain: Medium Risk (09/30/2023)   Overall Financial Resource Strain (CARDIA)   . Difficulty of Paying Living Expenses: Somewhat hard  Food Insecurity: No Food Insecurity (09/30/2023)   Hunger Vital Sign   . Worried About Programme researcher, broadcasting/film/video in the Last Year: Never true   . Ran Out of Food in the Last Year: Never true  Transportation Needs: No Transportation Needs (09/30/2023)   PRAPARE - Transportation   . Lack of Transportation (Medical): No   . Lack of Transportation (Non-Medical): No    Family History  Problem Relation Name Age of Onset  . Cancer Mother    . Diabetes Mother    . Cancer Father    . Diabetes Sister    . Cancer Son      Current Outpatient Medications on File Prior to Visit  Medication Sig Dispense Refill  . amLODIPine  (NORVASC ) 5 MG tablet Take 5 mg by mouth once daily    . cholecalciferol (VITAMIN D3) 1000 unit capsule Take 1,000 Units by mouth once daily    . denosumab  (PROLIA ) 60 mg/mL inj syringe Inject 60 mg subcutaneously    . denosumab -bbdz 60 mg/mL Syrg Inject 60 mg subcutaneously every 6 (six) months    . diclofenac (VOLTAREN) 1 % topical gel Apply 2 g topically 2 (two) times daily    . latanoprost (XALATAN) 0.005 % ophthalmic solution Place 1 drop into the left eye nightly    . levothyroxine  (SYNTHROID ) 50 MCG tablet Take 1 tablet by mouth once daily    . traMADoL  (ULTRAM ) 50 mg tablet Take 1 tablet (50 mg total) by mouth 2 (two) times daily as needed 1 po bid prn 14 tablet 0  . vit A/vit C/vit E/zinc/copper (PRESERVISION AREDS ORAL) PreserVision AREDS    . metoprolol  TARTrate (LOPRESSOR ) 25 MG tablet Take 12.5 mg by mouth 3 (three) times daily as needed     No current facility-administered  medications on file prior to visit.    Allergies as of 10/07/2023 - Reviewed 10/07/2023  Allergen Reaction Noted  . Sulfa (sulfonamide antibiotics) Other (See Comments) 06/24/2014    ROS More than 10 system, review of system form was given to the patient to fill out and has been signed by Dr. Avanell and scanned into the patient's chart.   Vital signs Vitals:   10/07/23 1128  BP: 116/59  Pulse: 53  Temp: (!) 35.9 C (96.7 F)  TempSrc: Oral  Weight: (!) 41.7 kg (91 lb 14.9 oz)  Height: 149.9 cm (4' 11.02)  PainSc: 1   PainLoc: Hip    Exam  General: Alert oriented well-nourished no distress  Cervical Exam (performed 10/07/2023)  Upon inspection there are no rashes or scars.  Cervical rotation is moderately limited bilaterally.  Spurling's maneuver is negative.  Upper Extremity Exam She has 5/5 strength in bilateral wrist extensors, biceps, triceps and deltoids, shoulder internal/external rotators.  Sensation is intact to light touch bilaterally.  Unable to elicit bilateral bicep, tricep and brachialis reflexes bilaterally.  Negative Hoffmann's bilaterally.  Lumbosacral Exam (performed 05/09/2023) On inspection no rash is noted.  On palpation she has mild tenderness of the lumbosacral paraspinal musculature.  Lumbar range of motion is limited.  Lower Extremity Exam She has 3/5 strength of the right dorsiflexors.  She has 5/5 strength of the left dorsiflexors, bilateral knee extensors, and bilateral hip flexors.  Sensation light touch is impaired distal to the mid tibia bilaterally consistent with her history of peripheral neuropathy.  She does not have ankle clonus.  Straight leg raise produces mild discomfort in the posterior thigh and calf bilaterally.  Range of motion of the hip joints does not produce pain.  She has full range of motion of the right ankle without notable pain.  Radiographic Data CT CERVICAL SPINE WITHOUT CONTRAST  TECHNIQUE:  Multidetector CT imaging of the  cervical spine was performed without  intravenous contrast. Multiplanar CT image reconstructions were also  generated.  RADIATION DOSE REDUCTION: This exam was performed according to the departmental dose-optimization program which includes automated  exposure control, adjustment of the mA and/or kV according to  patient size and/or use of iterative reconstruction technique.  COMPARISON:  CT cervical spine 09/30/2023 and 09/26/2023. MRI  cervical spine 10/12/2017.  FINDINGS:  Alignment: Stable mild convex left scoliosis, straightening and  minimal retrolisthesis at C4-5 and anterolisthesis at C5-6.  Skull base and vertebrae: No evidence of acute cervical spine  fracture or traumatic subluxation. Again demonstrated is a large,  partially calcified soft tissue mass/pseudotumor posterior to the  odontoid process which is asymmetric to the left, measuring  approximately 2.8 x 2.0 x 1.3 cm. This is associated with chronic  posterior osseous erosion and severe central spinal stenosis with  cord displacement to the right. Appearance is similar to the  patient's prior imaging.  Soft tissues and spinal canal: No prevertebral fluid or swelling. No  visible canal hematoma. As above, chronic ventral epidural mass on  the left at C1-2.  Disc levels: Advanced multilevel spondylosis with disc space  narrowing, uncinate spurring and bilateral facet hypertrophy.  Resulting multilevel spondylosis and foraminal narrowing, similar to  previous study. Spinal stenosis remains most severe at C4-5.  Upper chest: Clear lung apices.  Other: Bilateral carotid atherosclerosis.  IMPRESSION:  1. No evidence of acute cervical spine fracture, traumatic  subluxation or static signs of instability.  2. Advanced multilevel cervical spondylosis with resulting  multilevel spondylosis and foraminal narrowing, similar to previous  study.  3. Chronic large, partially calcified soft tissue mass/pseudotumor  posterior to  the odontoid process with chronic posterior osseous  erosion and severe central spinal stenosis with cord displacement to  the right. This could be symptomatic, although the appearance is  similar to previous imaging. Electronically Signed    By: Elsie Perone M.D.    On: 10/03/2023 13:15     Impression 1.  Acute on chronic low back pain with radiation into the bilateral buttock, posterior thighs, posterior calves.  Clinically she has symptoms consistent with a lumbosacral radiculitis and hip mediated pain.  Right hip x-ray from [facility] clinic dated 09/08/2023 revealed Moderate right hip osteoarthritis MRI of the lumbar spine  without contrast to [facility] dated 08/29/2023 imaging and report reviewed today. TECHNIQUE:  Multiplanar, multisequence MR imaging of the lumbar spine was  performed. No intravenous contrast was administered.  COMPARISON:  None Available.  FINDINGS:  Segmentation: Standard.  Alignment:  Grade 1 anterolisthesis of L4 on L5.  Vertebrae: No evidence of acute fracture or discitis/osteomyelitis.  No suspicious bone lesions. Degenerative/discogenic endplate signal  changes at multiple levels.  Conus medullaris and cauda equina: Conus extends to the L2-L3 level.  Conus appears normal.  Paraspinal and other soft tissues: Unremarkable.   Disc levels:  T12-L1: No significant disc protrusion, foraminal stenosis, or canal  stenosis.  L1-L2: Disc height loss with disc bulging and ligamentum flavum  thickening. Bilateral facet arthropathy. Resulting moderate left and  mild right foraminal stenosis. Mild to moderate canal stenosis.  L2-L3: Mild disc bulging and bilateral facet arthropathy. Resulting  moderate left and mild right foraminal stenosis. Mild canal  stenosis.  L3-L4: Mild left eccentric disc bulging. Left greater than right  facet arthropathy ligamentum flavum thickening. Mild left  subarticular recess stenosis and moderate left foraminal stenosis.  L4-L5:  Grade 1 anterolisthesis with uncovering the disc and disc  bulging. Severe bilateral facet arthropathy and ligamentum flavum  thickening. Resulting severe canal stenosis. Patent foramina.  L5-S1: Right eccentric disc bulging with facet arthropathy. Moderate  right foraminal stenosis. Patent canal.  IMPRESSION:  1. At L4-L5, severe canal stenosis. Grade 1 anterolisthesis at this  level.  2. Moderate left foraminal stenosis at L1-L2, L2-L3, and L3-L4.  3. Moderate right foraminal stenosis at L5-S1.  Electronically Signed    By: Gilmore GORMAN Molt M.D.    On: 08/29/2023 15:55   2.  Right dorsiflexor weakness.  Is unclear if this is related to her severe central stenosis at L4-5 or peripheral neuropathy.  3.  Peripheral neuropathy. 4.  Hypertension, dyslipidemia, gastroesophageal reflux disease, COPD, hypothyroidism. 5.  Glaucoma, macular degeneration. 6.  Osteoporosis. 7.  Right ankle swelling after fall in early March.  Clinically her symptoms most consistent with soft tissue injury however x-ray was performed for further workup.Right ankle x-ray obtained at [facility] clinic on 05/09/2023 some degenerative changes. 8.  History of significant cervical spine changes without notable neck pain or upper extremity pain.  No symptoms concerning for myelopathy on exam.   Plan 1.  Continue Tylenol  650 mg 3 times daily as needed.  She is also going to occasionally use ibuprofen with food. 2.  She has discontinued tramadol . 3.  Continue PT and OT as prescribed. 4.  She is planning to move into assisted living.  She states that she is falling not because she feels off balance or weak but because her wheel and her rolling walker get stuck on the carpet in her room.  She is planning to move to assisted living to help with this. 5.  She is planning to meet with Dr. Clois for follow-up of CT of the cervical spine findings. 6.  She will follow-up with our office as needed.  If her pain was to flare she  will call to schedule for another hip injection.  Social Drivers of Health with Concerns   Tobacco Use: Medium Risk (10/07/2023)   Patient History   . Smoking Tobacco Use: Former   . Smokeless Tobacco Use: Never  Financial Resource Strain: Medium Risk (09/30/2023)   Overall Financial Resource Strain (CARDIA)   . Difficulty of Paying Living Expenses: Somewhat hard  Physical Activity: Insufficiently Active (01/03/2023)   Received from  [facility]   Exercise Vital Sign   . On average, how many days per week do you engage in moderate to strenuous exercise (like a brisk walk)?: 2 days   . On average, how many minutes do you engage in exercise at this level?: 40 min  Social Connections: Moderately Isolated (05/21/2023)   Received from [facility]   Social Connection and Isolation Panel   . In a typical week, how many times do you talk on the phone with family, friends, or neighbors?: More than three times a week   . How often do you get together with friends or relatives?: More than three times a week   . How often do you attend church or religious services?: More than 4 times per year   . Do you belong to any clubs or organizations such as church groups, unions, fraternal or athletic groups, or school groups?: No   . How often do you attend meetings of the clubs or organizations you belong to?: Never   . Are you married, widowed, divorced, separated, never married, or living with a partner?: Widowed   I personally performed the service, non-incident to.  (WP)  WHITNEY MEELER, NP   This note was generated in part with voice recognition software and I apologize for any typographical errors that were not detected and corrected.  PCP:  Dr. Verneita Kettering

## 2023-10-07 NOTE — Telephone Encounter (Unsigned)
 Copied from CRM #[id number]. Topic: General - Call Back - No Documentation >> Oct 06, 2023  5:13 PM Gloria Carr wrote: Reason for CRM: Patient stated she received a call yesterday but she missed it cause she was gone all day. I didn't see any msg on my end. Patient wants to know what Gloria Carr was calling for. Callback number [phone]

## 2023-10-11 DIAGNOSIS — J189 Pneumonia, unspecified organism: Secondary | ICD-10-CM | POA: Diagnosis not present

## 2023-10-11 DIAGNOSIS — R2689 Other abnormalities of gait and mobility: Secondary | ICD-10-CM | POA: Diagnosis not present

## 2023-10-11 DIAGNOSIS — I480 Paroxysmal atrial fibrillation: Secondary | ICD-10-CM | POA: Diagnosis not present

## 2023-10-11 DIAGNOSIS — R2681 Unsteadiness on feet: Secondary | ICD-10-CM | POA: Diagnosis not present

## 2023-10-11 DIAGNOSIS — M6281 Muscle weakness (generalized): Secondary | ICD-10-CM | POA: Diagnosis not present

## 2023-10-12 ENCOUNTER — Ambulatory Visit (INDEPENDENT_AMBULATORY_CARE_PROVIDER_SITE_OTHER): Admitting: Internal Medicine

## 2023-10-12 ENCOUNTER — Encounter: Payer: Self-pay | Admitting: Internal Medicine

## 2023-10-12 VITALS — BP 134/70 | HR 70 | Ht <= 58 in | Wt 90.2 lb

## 2023-10-12 DIAGNOSIS — E559 Vitamin D deficiency, unspecified: Secondary | ICD-10-CM | POA: Diagnosis not present

## 2023-10-12 DIAGNOSIS — E039 Hypothyroidism, unspecified: Secondary | ICD-10-CM

## 2023-10-12 DIAGNOSIS — R296 Repeated falls: Secondary | ICD-10-CM

## 2023-10-12 DIAGNOSIS — E538 Deficiency of other specified B group vitamins: Secondary | ICD-10-CM | POA: Diagnosis not present

## 2023-10-12 DIAGNOSIS — M5431 Sciatica, right side: Secondary | ICD-10-CM | POA: Diagnosis not present

## 2023-10-12 DIAGNOSIS — M4802 Spinal stenosis, cervical region: Secondary | ICD-10-CM

## 2023-10-12 DIAGNOSIS — M810 Age-related osteoporosis without current pathological fracture: Secondary | ICD-10-CM

## 2023-10-12 NOTE — Progress Notes (Unsigned)
 Subjective:  Patient ID: Gloria Carr, female    DOB: Jul 27, 1927  Age: 89 y.o. MRN: [id number]  CC: There were no encounter diagnoses.   HPI Gloria Carr presents for  Chief Complaint  Patient presents with   Medical Management of Chronic Issues    3 month follow up     Accompanied by son Gloria Carr  3 falls in the last several months   Treated in ED on Sept 22 for occipital hematoma secondary to fall at home when her walker wheel got caught in carpet and she fell backward.  Had a 2nd fall after returning home , back to ER .   on the waiting list for (she's next    [place],   Seeing [facility] for [facility] of lumbar spine needs referral  to [facility]  2) wt loss of 6 lbs since June.   Drinks 1-2 ensures daily   3) constipation ;  benefiber caused  fecal incontinence due to a large volume stool.  She has awareness of the need to have a BM but can't get to the bathroom fast enough.    Vision is getting worse due to wet and dry MD. Also having some visual phenomenon (sees stars,  snowflakes,  flashes) sees [place] next week.    Neuropathy of feet now spreading to fingers.  B12 DEFICIENCY NOTED LAST DECEMBER.    Lab Results  Component Value Date   VITAMINB12 294 01/10/2023      Outpatient Medications Prior to Visit  Medication Sig Dispense Refill   Cholecalciferol (VITAMIN D3) 50 MCG (2000 UT) capsule Take 2,000 Units by mouth daily.     denosumab -bbdz 60 MG/ML SOSY Inject 60 mg into the skin every 6 (six) months. 1 mL 1   diclofenac Sodium (VOLTAREN) 1 % GEL Apply 2 g topically as needed (pain).     furosemide  (LASIX ) 20 MG tablet Take 1 tablet (20 mg total) by mouth every other day. For fluid retention /weight gain 45 tablet 2   latanoprost (XALATAN) 0.005 % ophthalmic solution Place 1 drop into the left eye at bedtime.  5   levothyroxine  (SYNTHROID ) 50 MCG tablet TAKE 1 TABLET EVERY DAY ON EMPTY STOMACHWITH A GLASS OF WATER AT LEAST 30-60 MINBEFORE BREAKFAST 90  tablet 1   metoprolol  succinate (TOPROL  XL) 25 MG 24 hr tablet Take 1 tablet (25 mg total) by mouth daily. 90 tablet 3   Multiple Vitamins-Minerals (PRESERVISION AREDS 2 PO) Take by mouth.     Propylene Glycol (SYSTANE BALANCE OP) Apply to eye.     No facility-administered medications prior to visit.    Review of Systems;  Patient denies headache, fevers, malaise, unintentional weight loss, skin rash, eye pain, sinus congestion and sinus pain, sore throat, dysphagia,  hemoptysis , cough, dyspnea, wheezing, chest pain, palpitations, orthopnea, edema, abdominal pain, nausea, melena, diarrhea, constipation, flank pain, dysuria, hematuria, urinary  Frequency, nocturia, numbness, tingling, seizures,  Focal weakness, Loss of consciousness,  Tremor, insomnia, depression, anxiety, and suicidal ideation.      Objective:  BP 134/70   Pulse 70   Ht 4' 8 (1.422 m)   Wt 90 lb 3.2 oz (40.9 kg)   LMP  (LMP Unknown)   SpO2 97%   BMI 20.22 kg/m   BP Readings from Last 3 Encounters:  10/12/23 134/70  10/03/23 132/69  09/30/23 123/66    Wt Readings from Last 3 Encounters:  10/12/23 90 lb 3.2 oz (40.9 kg)  10/03/23 96 lb (43.5 kg)  09/30/23 96 lb (43.5 kg)    Physical Exam  Lab Results  Component Value Date   HGBA1C 6.2 01/10/2023   HGBA1C 5.9 03/26/2022   HGBA1C 5.9 06/29/2021    Lab Results  Component Value Date   CREATININE 0.71 10/03/2023   CREATININE 0.61 07/11/2023   CREATININE 0.71 06/02/2023    Lab Results  Component Value Date   WBC 6.5 10/03/2023   HGB 13.7 10/03/2023   HCT 42.7 10/03/2023   PLT 282 10/03/2023   GLUCOSE 220 (H) 10/03/2023   CHOL 155 01/10/2023   TRIG 71.0 01/10/2023   HDL 81.70 01/10/2023   LDLDIRECT 47.0 01/10/2023   LDLCALC 60 01/10/2023   ALT 27 06/02/2023   AST 32 06/02/2023   NA 136 10/03/2023   K 3.8 10/03/2023   CL 100 10/03/2023   CREATININE 0.71 10/03/2023   BUN 30 (H) 10/03/2023   CO2 27 10/03/2023   TSH 3.209 05/23/2023    HGBA1C 6.2 01/10/2023    CT Head Wo Contrast Result Date: 10/03/2023 CLINICAL DATA:  89 year old female status post fall backwards. Struck head. Pain. EXAM: CT HEAD WITHOUT CONTRAST TECHNIQUE: Contiguous axial images were obtained from the base of the skull through the vertex without intravenous contrast. RADIATION DOSE REDUCTION: This exam was performed according to the departmental dose-optimization program which includes automated exposure control, adjustment of the mA and/or kV according to patient size and/or use of iterative reconstruction technique. COMPARISON:  Recent head CT 09/30/2023 and earlier. FINDINGS: Brain: Stable cerebral volume. No midline shift, ventriculomegaly, mass effect, evidence of mass lesion, intracranial hemorrhage or evidence of cortically based acute infarction. Confluent cerebral white matter hypodensity is stable. Vascular: Calcified atherosclerosis at the skull base. No suspicious intracranial vascular hyperdensity. Skull: Stable and intact. Cervical spine today is reported separately. Sinuses/Orbits: Visualized paranasal sinuses and mastoids are clear. Other: No acute orbit or scalp soft tissue injury identified. IMPRESSION: No acute traumatic injury identified. Stable non contrast CT appearance of the brain. Electronically Signed   By: VEAR Hurst M.D.   On: 10/03/2023 13:26   CT Cervical Spine Wo Contrast Result Date: 10/03/2023 CLINICAL DATA:  Status post fall. Patient struck the back of the head. EXAM: CT CERVICAL SPINE WITHOUT CONTRAST TECHNIQUE: Multidetector CT imaging of the cervical spine was performed without intravenous contrast. Multiplanar CT image reconstructions were also generated. RADIATION DOSE REDUCTION: This exam was performed according to the departmental dose-optimization program which includes automated exposure control, adjustment of the mA and/or kV according to patient size and/or use of iterative reconstruction technique. COMPARISON:  CT cervical spine  09/30/2023 and 09/26/2023. MRI cervical spine 10/12/2017. FINDINGS: Alignment: Stable mild convex left scoliosis, straightening and minimal retrolisthesis at C4-5 and anterolisthesis at C5-6. Skull base and vertebrae: No evidence of acute cervical spine fracture or traumatic subluxation. Again demonstrated is a large, partially calcified soft tissue mass/pseudotumor posterior to the odontoid process which is asymmetric to the left, measuring approximately 2.8 x 2.0 x 1.3 cm. This is associated with chronic posterior osseous erosion and severe central spinal stenosis with cord displacement to the right. Appearance is similar to the patient's prior imaging. Soft tissues and spinal canal: No prevertebral fluid or swelling. No visible canal hematoma. As above, chronic ventral epidural mass on the left at C1-2. Disc levels: Advanced multilevel spondylosis with disc space narrowing, uncinate spurring and bilateral facet hypertrophy. Resulting multilevel spondylosis and foraminal narrowing, similar to previous study. Spinal stenosis remains most severe at C4-5. Upper chest: Clear lung apices. Other:  Bilateral carotid atherosclerosis. IMPRESSION: 1. No evidence of acute cervical spine fracture, traumatic subluxation or static signs of instability. 2. Advanced multilevel cervical spondylosis with resulting multilevel spondylosis and foraminal narrowing, similar to previous study. 3. Chronic large, partially calcified soft tissue mass/pseudotumor posterior to the odontoid process with chronic posterior osseous erosion and severe central spinal stenosis with cord displacement to the right. This could be symptomatic, although the appearance is similar to previous imaging. Electronically Signed   By: Elsie Perone M.D.   On: 10/03/2023 13:15    Assessment & Plan:  .There are no diagnoses linked to this encounter.   I spent 34 minutes on the day of this face to face encounter reviewing patient's  most recent visit with  cardiology,  nephrology,  and neurology,  prior relevant surgical and non surgical procedures, recent  labs and imaging studies, counseling on weight management,  reviewing the assessment and plan with patient, and post visit ordering and reviewing of  diagnostics and therapeutics with patient  .   Follow-up: No follow-ups on file.   Verneita LITTIE Kettering, MD

## 2023-10-12 NOTE — Patient Instructions (Addendum)
 You can take 1000 mg tylenol  plus 400 mg  motrin (advil, ibuprofen ) every 8 hours  for your  headache   I am making a referral to [employer] . [facility] neurosurgeon ,  who is in [place]

## 2023-10-12 NOTE — Assessment & Plan Note (Signed)
 SYMPTOMS HAVE IMPORVED WITH ESI DONE BY PHYSIATRY

## 2023-10-13 ENCOUNTER — Encounter (HOSPITAL_COMMUNITY): Payer: Self-pay

## 2023-10-13 ENCOUNTER — Other Ambulatory Visit: Payer: Self-pay

## 2023-10-13 ENCOUNTER — Other Ambulatory Visit (HOSPITAL_COMMUNITY): Payer: Self-pay

## 2023-10-13 DIAGNOSIS — R296 Repeated falls: Secondary | ICD-10-CM | POA: Insufficient documentation

## 2023-10-13 LAB — B12 AND FOLATE PANEL
Folate: 14.4 ng/mL (ref >5.9)
Vitamin B-12: 411 pg/mL (ref 211–911)

## 2023-10-13 LAB — TSH: TSH: 2.09 u[IU]/mL (ref 0.35–5.50)

## 2023-10-13 MED ORDER — DENOSUMAB-BBDZ 60 MG/ML ~~LOC~~ SOSY
60.0000 mg | PREFILLED_SYRINGE | SUBCUTANEOUS | 1 refills | Status: AC
  Filled 2023-10-13: qty 1, fill #0
  Filled 2023-10-13: qty 1, 180d supply, fill #0

## 2023-10-13 NOTE — Progress Notes (Signed)
 Patient to be enrolled with [facility]. Routed to Tiffany.

## 2023-10-13 NOTE — Assessment & Plan Note (Addendum)
 With history of pelvic fractures. Managed with Prolia  Last dose missed in May due to pharmacy cancelling order,  now awaiting scheudled dose of Jubbbonti

## 2023-10-13 NOTE — Progress Notes (Signed)
 Specialty Pharmacy Initial Fill Coordination Note  Gloria Carr is a 89 y.o. female contacted today regarding initial fill of specialty medication(s) Denosumab -bbdz   Patient requested Courier to Provider Office   Delivery date: 10/17/23   Verified address: [facility] [address]   Medication will be filled on 10/3.   Patient is aware of $250 copayment.

## 2023-10-13 NOTE — Progress Notes (Signed)
 Pharmacy Patient Advocate Encounter  Insurance verification completed.   The patient is insured through [facility] ADVANTAGE/RX ADVANCE   Ran test claim for Jubbonti . Co-pay is $250.  This test claim was processed through [facility]- copay amounts may vary at other pharmacies due to pharmacy/plan contracts, or as the patient moves through the different stages of their insurance plan.

## 2023-10-13 NOTE — Assessment & Plan Note (Signed)
 She has had 3 falls in the last month despite use of rolling walker due to loss of balance and frequent PT.  She has cervical and lumbar spinal stenosis with chronic radicular pain to both legs, and peripheral neuropathy . 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 She is awaiting a residential transition [place] at [facility].  Lab Results  Component Value Date   VITAMINB12 294 01/10/2023

## 2023-10-13 NOTE — Telephone Encounter (Signed)
 Called pt but was unable to LVM phone kept ringing

## 2023-10-13 NOTE — Assessment & Plan Note (Addendum)
 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    Referring to Dr Clois for surgical opinion

## 2023-10-13 NOTE — Addendum Note (Signed)
 Addended by: BRIEN SHARENE RAMAN on: 10/13/2023 08:46 AM   Modules accepted: Orders

## 2023-10-14 ENCOUNTER — Telehealth: Payer: Self-pay

## 2023-10-14 ENCOUNTER — Other Ambulatory Visit: Payer: Self-pay

## 2023-10-14 ENCOUNTER — Other Ambulatory Visit (HOSPITAL_COMMUNITY): Payer: Self-pay

## 2023-10-14 NOTE — Telephone Encounter (Signed)
 I spoke with son (on [place]) and explained to him that it is in fact legit & provided him with more detail from the [employer].  He will call [facility] pharmacy back today to get it settled & shipped to our office.

## 2023-10-14 NOTE — Telephone Encounter (Signed)
 Copied from CRM [id number]. Topic: Clinical - Medication Question >> Oct 14, 2023 10:17 AM Maisie BROCKS wrote: Reason for CRM: pt's son, Ozell, called and asked to speak with nurse about the patient's prolia  shot because they was contacted by a third party organization about scheduling her prolia  but they asked for the pt's credit card info before hand. He wants to know If this is a scam and if Dr. Tullo if recommending if she gets the Prolia  injection at this time.  Please call michael back by eod at [phone].

## 2023-10-15 ENCOUNTER — Ambulatory Visit: Payer: Self-pay | Admitting: Internal Medicine

## 2023-10-17 DIAGNOSIS — H353123 Nonexudative age-related macular degeneration, left eye, advanced atrophic without subfoveal involvement: Secondary | ICD-10-CM | POA: Diagnosis not present

## 2023-10-17 DIAGNOSIS — H40001 Preglaucoma, unspecified, right eye: Secondary | ICD-10-CM | POA: Diagnosis not present

## 2023-10-17 DIAGNOSIS — H353114 Nonexudative age-related macular degeneration, right eye, advanced atrophic with subfoveal involvement: Secondary | ICD-10-CM | POA: Diagnosis not present

## 2023-10-17 DIAGNOSIS — H401121 Primary open-angle glaucoma, left eye, mild stage: Secondary | ICD-10-CM | POA: Diagnosis not present

## 2023-10-17 NOTE — Progress Notes (Unsigned)
 Referring Physician:  Marylynn Verneita CROME, MD [address] [place],  [place] [address]  Primary Physician:  Marylynn Verneita CROME, MD  History of Present Illness: 10/19/2023 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  Currently she does have some chronic neck pain that extends into the back of her head.  She has noticed increased difficulty with walking due to feeling off balance over the past 6 to 8 months.  She also has chronic neuropathy in her hands and feet.    Weakness: none  Bowel/Bladder Dysfunction: none  Conservative measures:  Physical therapy:  Has participated in for only hip Multimodal medical therapy including regular antiinflammatories:  tylenol , motrin, tramadol  Injections:   07/02/2022 L5-S1  Past Surgery: no spine or neck surgery  Gloria Carr has  symptoms of cervical myelopathy.  The symptoms are causing a significant impact on the patient's life.   Review of Systems:  A 10 point review of systems is negative, except for the pertinent positives and negatives detailed in the HPI.  Past Medical History: Past Medical History:  Diagnosis Date   Allergy    Arthritis    Closed fracture of left superior pubic ramus, with routine healing, subsequent encounter 08/24/2016   Community acquired pneumonia of left lower lobe of lung 05/21/2023   COVID-19 virus infection 04/05/2022   E. coli UTI 04/08/2022   Glaucoma    Glaucoma    History of chickenpox    History of recurrent UTIs    Hypertension    Macular degeneration    Spinal stenosis    Thyroid  disease     Past Surgical History: Past Surgical History:  Procedure Laterality Date   BILATERAL CARPAL TUNNEL RELEASE  1999-2000   CATARACT EXTRACTION, BILATERAL  2013   COLONOSCOPY N/A 05/25/2023   Procedure: COLONOSCOPY;  Surgeon: Jinny Carmine, MD;  Location: [facility]  ENDOSCOPY;  Service: Endoscopy;  Laterality: N/A;    Allergies: Allergies as of 10/19/2023 - Review Complete 10/19/2023  Allergen Reaction Noted   Sulfa antibiotics Other (See Comments) 06/24/2014    Medications: Outpatient Encounter Medications as of 10/19/2023  Medication Sig   Cholecalciferol (VITAMIN D3) 50 MCG (2000 UT) capsule Take 2,000 Units by mouth daily.   cyanocobalamin  (VITAMIN B12) 1000 MCG tablet Take 1,000 mcg by mouth daily.   denosumab -bbdz 60 MG/ML SOSY Inject 60 mg into the skin every 6 (six) months.   diclofenac Sodium (VOLTAREN) 1 % GEL Apply 2 g topically as needed (pain).   furosemide  (LASIX ) 20 MG tablet Take 1 tablet (20 mg total) by mouth every other day. For fluid retention /weight gain   latanoprost (XALATAN) 0.005 % ophthalmic solution Place 1 drop into the left eye at bedtime.   levothyroxine  (SYNTHROID ) 50 MCG tablet TAKE 1 TABLET EVERY DAY ON EMPTY STOMACHWITH A GLASS OF WATER AT LEAST 30-60 MINBEFORE BREAKFAST   metoprolol  succinate (TOPROL  XL) 25 MG 24 hr tablet Take 1 tablet (25 mg total) by mouth daily.   Multiple Vitamins-Minerals (PRESERVISION AREDS 2 PO) Take by mouth.   Propylene Glycol (SYSTANE BALANCE OP) Apply to eye.   No facility-administered encounter medications on file as of 10/19/2023.    Social History: Social History   Tobacco Use   Smoking status: Former   Smokeless tobacco: Never  Advertising account planner   Vaping status: Never Used  Substance Use Topics  Alcohol use: Yes    Alcohol/week: 5.0 standard drinks of alcohol    Types: 5 Standard drinks or equivalent per week    Comment: with dinner   Drug use: No    Family Medical History: Family History  Problem Relation Age of Onset   Stroke Mother    Cancer Mother 75       colon   Cancer Father        bone   Cancer Son        Jaw   Stroke Sister    Breast cancer Neg Hx     Physical Examination: @VITALWITHPAIN @  General: Patient is well developed, well nourished, calm,  collected, and in no apparent distress. Attention to examination is appropriate.  Psychiatric: Patient is non-anxious.  Head:  Pupils equal, round, and reactive to light.  ENT:  Oral mucosa appears well hydrated.  Neck:   Supple.    Respiratory: Patient is breathing without any difficulty.  Extremities: No edema.  Vascular: Palpable dorsal pedal pulses.  Skin:   On exposed skin, there are no abnormal skin lesions.  NEUROLOGICAL:     Awake, alert, oriented to person, place, and time.  However patient does have some difficulty answering questions.  Cranial Nerves: Pupils equal round and reactive to light.  Facial tone is symmetric.   ROM of spine: Some limited cervical spine range of motion, but minimal tenderness of patient of her cervical spine.    Strength: Side Biceps Triceps Deltoid Interossei Grip Wrist Ext. Wrist Flex.  R 5 5 4+ 5 5 5 5   L 5 5 4 5 5 5 5    Side Iliopsoas Quads Hamstring PF DF EHL  R 5 5 5 5 3 5   L 5 5 5 5 5 5   Possible hoffman 1-2+ reflexes bilateral upper extremities, + cross adductor, 2+ patella, trace achilles.   Patient in wheelchair. Gait not visualized.  Medical Decision Making  Imaging: EXAM: CT CERVICAL SPINE WITHOUT CONTRAST   TECHNIQUE: Multidetector CT imaging of the cervical spine was performed without intravenous contrast. Multiplanar CT image reconstructions were also generated.   RADIATION DOSE REDUCTION: This exam was performed according to the departmental dose-optimization program which includes automated exposure control, adjustment of the mA and/or kV according to patient size and/or use of iterative reconstruction technique.   COMPARISON:  CT cervical spine 09/30/2023 and 09/26/2023. MRI cervical spine 10/12/2017.   FINDINGS: Alignment: Stable mild convex left scoliosis, straightening and minimal retrolisthesis at C4-5 and anterolisthesis at C5-6.   Skull base and vertebrae: No evidence of acute cervical  spine fracture or traumatic subluxation. Again demonstrated is a large, partially calcified soft tissue mass/pseudotumor posterior to the odontoid process which is asymmetric to the left, measuring approximately 2.8 x 2.0 x 1.3 cm. This is associated with chronic posterior osseous erosion and severe central spinal stenosis with cord displacement to the right. Appearance is similar to the patient's prior imaging.   Soft tissues and spinal canal: No prevertebral fluid or swelling. No visible canal hematoma. As above, chronic ventral epidural mass on the left at C1-2.   Disc levels: Advanced multilevel spondylosis with disc space narrowing, uncinate spurring and bilateral facet hypertrophy. Resulting multilevel spondylosis and foraminal narrowing, similar to previous study. Spinal stenosis remains most severe at C4-5.   Upper chest: Clear lung apices.   Other: Bilateral carotid atherosclerosis.   IMPRESSION: 1. No evidence of acute cervical spine fracture, traumatic subluxation or static signs of instability. 2. Advanced multilevel cervical  spondylosis with resulting multilevel spondylosis and foraminal narrowing, similar to previous study. 3. Chronic large, partially calcified soft tissue mass/pseudotumor posterior to the odontoid process with chronic posterior osseous erosion and severe central spinal stenosis with cord displacement to  the right. This could be symptomatic, although the appearance is similar to previous imaging.    I have personally reviewed the images and agree with the above interpretation.  Assessment and Plan: [name] is a pleasant 89 y.o. 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 She does have some neck pain and this very well could be contributing to her being off balance.  However, this is discussed at length with patient and her power of  attorney (son) that treatment for this would involve surgery.  Both stated that that would not be a part of their wishes and they would like to avoid surgery at all cost.  They plan to do over-the-counter treatment and will let me know if they change their mind.  I encouraged them to continue physical therapy if they would be able to in order to help with her gait instability.      Thank you for involving me in the care of this patient.  Happy to discuss this with them further if they decide differently.  I spent a total of 30 minutes in both face-to-face and non-face-to-face activities for this visit on the date of this encounter including preparing to see the patient, obtaining and reviewing separately obtained history, performing medically appropriate examination, counseling the patient and their family, ordering additional medications and tests, documenting clinical information, independently interpreting results, coordination of care.   Lyle Decamp, PA-C Dept. of Neurosurgery

## 2023-10-18 ENCOUNTER — Other Ambulatory Visit (HOSPITAL_COMMUNITY): Payer: Self-pay

## 2023-10-18 ENCOUNTER — Other Ambulatory Visit: Payer: Self-pay

## 2023-10-18 DIAGNOSIS — R2681 Unsteadiness on feet: Secondary | ICD-10-CM | POA: Diagnosis not present

## 2023-10-18 DIAGNOSIS — M6281 Muscle weakness (generalized): Secondary | ICD-10-CM | POA: Diagnosis not present

## 2023-10-18 DIAGNOSIS — J189 Pneumonia, unspecified organism: Secondary | ICD-10-CM | POA: Diagnosis not present

## 2023-10-18 DIAGNOSIS — I480 Paroxysmal atrial fibrillation: Secondary | ICD-10-CM | POA: Diagnosis not present

## 2023-10-18 DIAGNOSIS — R2689 Other abnormalities of gait and mobility: Secondary | ICD-10-CM | POA: Diagnosis not present

## 2023-10-18 NOTE — Progress Notes (Signed)
 Tiffany spoke with patient's son. He has called to give us  card number with [facility]. He is aware of $250 copayment. Medication will be sent to [facility] via courier on 10/19/23.

## 2023-10-18 NOTE — Telephone Encounter (Unsigned)
 Copied from CRM #[id number]. Topic: General - Other >> Oct 18, 2023  2:29 PM Gloria Carr wrote: Reason for CRM: Patient called in regards to her prolia  shot, she said she received a letter and she has some questions. Patient can be reached at [phone].

## 2023-10-19 ENCOUNTER — Ambulatory Visit: Admitting: Physician Assistant

## 2023-10-19 VITALS — BP 136/72 | Ht <= 58 in | Wt 85.0 lb

## 2023-10-19 DIAGNOSIS — R2681 Unsteadiness on feet: Secondary | ICD-10-CM

## 2023-10-19 DIAGNOSIS — G932 Benign intracranial hypertension: Secondary | ICD-10-CM | POA: Diagnosis not present

## 2023-10-19 DIAGNOSIS — M4802 Spinal stenosis, cervical region: Secondary | ICD-10-CM | POA: Diagnosis not present

## 2023-10-19 NOTE — Telephone Encounter (Signed)
 Gloria Carr  was received from [facility] today

## 2023-10-20 ENCOUNTER — Other Ambulatory Visit: Payer: Self-pay | Admitting: *Deleted

## 2023-10-20 DIAGNOSIS — M810 Age-related osteoporosis without current pathological fracture: Secondary | ICD-10-CM

## 2023-10-20 DIAGNOSIS — E559 Vitamin D deficiency, unspecified: Secondary | ICD-10-CM

## 2023-10-20 NOTE — Telephone Encounter (Signed)
 Jubbonti  injection scheduled for 10/21/23

## 2023-10-21 ENCOUNTER — Telehealth: Payer: Self-pay

## 2023-10-21 ENCOUNTER — Ambulatory Visit

## 2023-10-21 MED ORDER — DENOSUMAB-BBDZ 60 MG/ML ~~LOC~~ SOSY: 60.0000 mg | PREFILLED_SYRINGE | SUBCUTANEOUS | Status: DC

## 2023-10-21 NOTE — Progress Notes (Deleted)
 After obtaining consent, and per orders of Dr.Tullo, MD injection of Jubbonti  given SubQ by Doyce Croak, CMA. Patient vitals were taken at and then allowed patient to sit for 15 minutes and then rechecked the vitals.

## 2023-10-21 NOTE — Telephone Encounter (Signed)
 Patient arrived for initial encounter for Jubbonti  injection. When patient arrived her first set of vitals on the machine were 182/138 P.71. Patient stated she was in a lot of pain (9-level). Patient waited for 5 minutes and rechecked her blood pressure manually at 150/100 P. 61. Patient sat another 10 minutes and her second set of manual vitals were 156/80. Doctor of the Day stated there would be nothing she would be able to address with the patient today, as the patient recently seen by Neurosurgeon for recent falls and pain level concerns. I informed patient and her son, that high blood pressure could be a possible side effect and we would hold off on the injection today since patient's PCP is not in the office today.

## 2023-10-21 NOTE — Telephone Encounter (Signed)
 See note from 10/10. Pt was not given Jubbonti  due to elevated bp

## 2023-10-22 ENCOUNTER — Other Ambulatory Visit: Payer: Self-pay | Admitting: Internal Medicine

## 2023-10-25 DIAGNOSIS — M25551 Pain in right hip: Secondary | ICD-10-CM | POA: Diagnosis not present

## 2023-10-25 DIAGNOSIS — R2689 Other abnormalities of gait and mobility: Secondary | ICD-10-CM | POA: Diagnosis not present

## 2023-10-25 DIAGNOSIS — M47816 Spondylosis without myelopathy or radiculopathy, lumbar region: Secondary | ICD-10-CM | POA: Diagnosis not present

## 2023-10-25 DIAGNOSIS — J189 Pneumonia, unspecified organism: Secondary | ICD-10-CM | POA: Diagnosis not present

## 2023-10-25 DIAGNOSIS — R2681 Unsteadiness on feet: Secondary | ICD-10-CM | POA: Diagnosis not present

## 2023-10-25 DIAGNOSIS — M6281 Muscle weakness (generalized): Secondary | ICD-10-CM | POA: Diagnosis not present

## 2023-10-25 DIAGNOSIS — I480 Paroxysmal atrial fibrillation: Secondary | ICD-10-CM | POA: Diagnosis not present

## 2023-10-26 ENCOUNTER — Encounter: Payer: Self-pay | Admitting: Internal Medicine

## 2023-10-26 ENCOUNTER — Ambulatory Visit

## 2023-10-26 VITALS — BP 130/60 | HR 70

## 2023-10-26 DIAGNOSIS — M810 Age-related osteoporosis without current pathological fracture: Secondary | ICD-10-CM

## 2023-10-26 MED ORDER — DENOSUMAB-BBDZ 60 MG/ML ~~LOC~~ SOSY
60.0000 mg | PREFILLED_SYRINGE | Freq: Once | SUBCUTANEOUS | Status: AC
  Administered 2023-10-26: 60 mg via SUBCUTANEOUS

## 2023-10-26 MED ORDER — DENOSUMAB-BBDZ 60 MG/ML ~~LOC~~ SOSY: 60.0000 mg | PREFILLED_SYRINGE | Freq: Once | SUBCUTANEOUS | Status: AC

## 2023-10-26 NOTE — Progress Notes (Signed)
 Pt received 1st Jubbonti .in left arm  Pt tolerated it well with no complaints or concerns. When pt arrived I took her bp in her right arm and it was 130/70 pulse was 72 O2 was 94 had pt sit for 15 mins after administration and then I rechecked her bp  in right arm and it was 130/60 Pulse was 70 O2 was 95.

## 2023-10-27 ENCOUNTER — Encounter: Payer: Self-pay | Admitting: Pharmacist

## 2023-10-27 NOTE — Progress Notes (Signed)
 Pharmacy Quality Measure Review  This patient is appearing on a report for being at risk of failing the adherence measure for cholesterol (statin) medications this calendar year.   Medication: atorvastatin  20 mg Last fill date: 05/20/23 for 90 day supply  Medication no longer prescribed. No further actin needed.

## 2023-11-02 DIAGNOSIS — I4891 Unspecified atrial fibrillation: Secondary | ICD-10-CM | POA: Diagnosis not present

## 2023-11-02 DIAGNOSIS — M4802 Spinal stenosis, cervical region: Secondary | ICD-10-CM | POA: Diagnosis not present

## 2023-11-02 DIAGNOSIS — E039 Hypothyroidism, unspecified: Secondary | ICD-10-CM | POA: Diagnosis not present

## 2023-11-02 DIAGNOSIS — M810 Age-related osteoporosis without current pathological fracture: Secondary | ICD-10-CM | POA: Diagnosis not present

## 2023-11-08 DIAGNOSIS — M6281 Muscle weakness (generalized): Secondary | ICD-10-CM | POA: Diagnosis not present

## 2023-11-08 DIAGNOSIS — I4891 Unspecified atrial fibrillation: Secondary | ICD-10-CM | POA: Diagnosis not present

## 2023-11-08 DIAGNOSIS — R2681 Unsteadiness on feet: Secondary | ICD-10-CM | POA: Diagnosis not present

## 2023-11-08 DIAGNOSIS — R2689 Other abnormalities of gait and mobility: Secondary | ICD-10-CM | POA: Diagnosis not present

## 2023-11-08 DIAGNOSIS — R296 Repeated falls: Secondary | ICD-10-CM | POA: Diagnosis not present

## 2023-11-11 DIAGNOSIS — I4891 Unspecified atrial fibrillation: Secondary | ICD-10-CM | POA: Diagnosis not present

## 2023-11-11 DIAGNOSIS — M6281 Muscle weakness (generalized): Secondary | ICD-10-CM | POA: Diagnosis not present

## 2023-11-11 DIAGNOSIS — R2681 Unsteadiness on feet: Secondary | ICD-10-CM | POA: Diagnosis not present

## 2023-11-11 DIAGNOSIS — R2689 Other abnormalities of gait and mobility: Secondary | ICD-10-CM | POA: Diagnosis not present

## 2023-11-11 DIAGNOSIS — R296 Repeated falls: Secondary | ICD-10-CM | POA: Diagnosis not present

## 2023-11-15 DIAGNOSIS — R2689 Other abnormalities of gait and mobility: Secondary | ICD-10-CM | POA: Diagnosis not present

## 2023-11-15 DIAGNOSIS — R296 Repeated falls: Secondary | ICD-10-CM | POA: Diagnosis not present

## 2023-11-15 DIAGNOSIS — R2681 Unsteadiness on feet: Secondary | ICD-10-CM | POA: Diagnosis not present

## 2023-11-15 DIAGNOSIS — I4891 Unspecified atrial fibrillation: Secondary | ICD-10-CM | POA: Diagnosis not present

## 2023-11-15 DIAGNOSIS — M6281 Muscle weakness (generalized): Secondary | ICD-10-CM | POA: Diagnosis not present

## 2023-11-16 ENCOUNTER — Emergency Department
Admission: EM | Admit: 2023-11-16 | Discharge: 2023-11-16 | Disposition: A | Discharge status: Skilled Nursing Facility | Attending: Emergency Medicine | Admitting: Emergency Medicine

## 2023-11-16 ENCOUNTER — Emergency Department

## 2023-11-16 ENCOUNTER — Other Ambulatory Visit: Payer: Self-pay

## 2023-11-16 ENCOUNTER — Encounter: Payer: Self-pay | Admitting: Emergency Medicine

## 2023-11-16 DIAGNOSIS — S199XXA Unspecified injury of neck, initial encounter: Secondary | ICD-10-CM | POA: Diagnosis not present

## 2023-11-16 DIAGNOSIS — S2232XA Fracture of one rib, left side, initial encounter for closed fracture: Secondary | ICD-10-CM | POA: Insufficient documentation

## 2023-11-16 DIAGNOSIS — I10 Essential (primary) hypertension: Secondary | ICD-10-CM | POA: Insufficient documentation

## 2023-11-16 DIAGNOSIS — E039 Hypothyroidism, unspecified: Secondary | ICD-10-CM | POA: Diagnosis not present

## 2023-11-16 DIAGNOSIS — M488X2 Other specified spondylopathies, cervical region: Secondary | ICD-10-CM | POA: Diagnosis not present

## 2023-11-16 DIAGNOSIS — S299XXA Unspecified injury of thorax, initial encounter: Secondary | ICD-10-CM | POA: Diagnosis present

## 2023-11-16 DIAGNOSIS — W19XXXA Unspecified fall, initial encounter: Secondary | ICD-10-CM | POA: Insufficient documentation

## 2023-11-16 DIAGNOSIS — I639 Cerebral infarction, unspecified: Secondary | ICD-10-CM | POA: Diagnosis not present

## 2023-11-16 DIAGNOSIS — I63432 Cerebral infarction due to embolism of left posterior cerebral artery: Secondary | ICD-10-CM | POA: Diagnosis not present

## 2023-11-16 DIAGNOSIS — R0781 Pleurodynia: Secondary | ICD-10-CM | POA: Diagnosis not present

## 2023-11-16 DIAGNOSIS — M4802 Spinal stenosis, cervical region: Secondary | ICD-10-CM | POA: Diagnosis not present

## 2023-11-16 DIAGNOSIS — I6782 Cerebral ischemia: Secondary | ICD-10-CM | POA: Diagnosis not present

## 2023-11-16 DIAGNOSIS — M4801 Spinal stenosis, occipito-atlanto-axial region: Secondary | ICD-10-CM | POA: Diagnosis not present

## 2023-11-16 DIAGNOSIS — I63439 Cerebral infarction due to embolism of unspecified posterior cerebral artery: Secondary | ICD-10-CM

## 2023-11-16 DIAGNOSIS — M47812 Spondylosis without myelopathy or radiculopathy, cervical region: Secondary | ICD-10-CM | POA: Diagnosis not present

## 2023-11-16 DIAGNOSIS — S0990XA Unspecified injury of head, initial encounter: Secondary | ICD-10-CM | POA: Diagnosis not present

## 2023-11-16 MED ORDER — LIDOCAINE 5 % EX PTCH
1.0000 | MEDICATED_PATCH | CUTANEOUS | Status: DC
  Administered 2023-11-16: 1 via TRANSDERMAL
  Filled 2023-11-16: qty 1

## 2023-11-16 MED ORDER — LIDOCAINE 5 % EX PTCH: 1.0000 | MEDICATED_PATCH | Freq: Two times a day (BID) | CUTANEOUS | 0 refills | Status: DC

## 2023-11-16 MED ORDER — ACETAMINOPHEN ER 650 MG PO TBCR: 650.0000 mg | EXTENDED_RELEASE_TABLET | Freq: Three times a day (TID) | ORAL | 0 refills | Status: DC | PRN

## 2023-11-16 MED ORDER — ACETAMINOPHEN 500 MG PO TABS
500.0000 mg | ORAL_TABLET | Freq: Four times a day (QID) | ORAL | 0 refills | Status: AC | PRN
Start: 2023-11-16 — End: ?

## 2023-11-16 MED ORDER — ACETAMINOPHEN 325 MG PO TABS
650.0000 mg | ORAL_TABLET | Freq: Once | ORAL | Status: AC
  Administered 2023-11-16: 650 mg via ORAL
  Filled 2023-11-16: qty 2

## 2023-11-16 MED ORDER — LIDOCAINE 5 % EX PTCH: 1.0000 | MEDICATED_PATCH | Freq: Two times a day (BID) | CUTANEOUS | 0 refills | Status: AC

## 2023-11-16 NOTE — ED Provider Notes (Addendum)
 [facility] Provider Note    Event Date/Time   First MD Initiated Contact with Patient 11/16/23 1519     (approximate)   History   Fall    HPI  Gloria Carr is a 89 y.o. female    with a past medical history of A-fib, lumbar spondylosis, CO2 2 more, lumbar stenosis, iron deficiency, who presents to the ED complaining of a medical fall. According to the patient, fall this morning at her assisted living room.  Patient endorses falling backwards without loss of consciousness.  Patient complains of left sided chest pain when taking a deep breath.  Patient is not taking any blood thinners.  Patient is here with her son.  Her son endorses patient is falling frequently.  Patient has macular degeneration, and no vision in the right eye.  Patient has neuropathy in hands and foot.  Described as a numbness.  Patient endorses having weakness in both hands and feet.    Patient Active Problem List   Diagnosis Date Noted   Frequent falls 10/13/2023   Iron deficiency anemia due to chronic blood loss 06/02/2023   Hospital discharge follow-up 06/02/2023   Rectal bleeding 05/23/2023   Pleural effusion 05/22/2023   Shortness of breath 05/22/2023   GI bleed 05/22/2023   Atrial fibrillation (HCC) 05/21/2023   Fear of falling 01/10/2023   Counseling regarding advance care planning and goals of care 07/09/2022   History of fall within past 90 days 03/26/2022   Peripheral vascular disease 03/26/2022   Constipation 01/02/2022   Hiatal hernia 10/10/2021   Pancreatic duct dilated 10/10/2021   Abdominal aortic atherosclerosis 10/09/2021   Leg swelling 09/16/2021   Hearing loss of right ear due to cerumen impaction 07/01/2021   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, unspecified stage (HCC) 01/16/2021   Macular degeneration, bilateral 07/19/2020   Nocturia 04/10/2019   Sciatica 12/21/2018   Post-menopausal atrophic vaginitis 08/09/2018   Prediabetes 01/29/2018    Degenerative cervical spinal stenosis 12/10/2017   Low back pain 04/14/2017   Lumbar spinal stenosis 12/15/2016   Essential hypertension 11/27/2016   Pure hypercholesterolemia 04/15/2016   Vitamin D  deficiency 04/15/2016   Osteoporosis 12/25/2015   Visit for preventive health examination 01/19/2015   Acquired hypothyroidism 07/16/2014   Hereditary and idiopathic peripheral neuropathy 07/16/2014     Physical Exam   Triage Vital Signs: ED Triage Vitals  Encounter Vitals Group     BP 11/16/23 1456 110/83     Girls Systolic BP Percentile --      Girls Diastolic BP Percentile --      Boys Systolic BP Percentile --      Boys Diastolic BP Percentile --      Pulse Rate 11/16/23 1456 64     Resp 11/16/23 1456 18     Temp 11/16/23 1456 98.1 F (36.7 C)     Temp Source 11/16/23 1456 Oral     SpO2 11/16/23 1456 99 %     Weight 11/16/23 1454 83 lb 12.4 oz (38 kg)     Height 11/16/23 1454 4' 8 (1.422 m)     Head Circumference --      Peak Flow --      Pain Score 11/16/23 1454 9     Pain Loc --      Pain Education --      Exclude from Growth Chart --     Most recent vital signs: Vitals:   11/16/23 1456  BP: 110/83  Pulse: 64  Resp: 18  Temp: 98.1 F (36.7 C)  SpO2: 99%     Physical Exam Vitals and nursing note reviewed.  During triage vital signs were normal  General:          Awake, no distress, oriented x 4. Head: Atraumatic.  PERRLA.  No depressions at palpation of the scalp. Neck: Skin is intact, no ecchymosis, no hematomas.  Supple.  Mouth: Dry mucosa CV:                  Good peripheral perfusion.  Resp:               Normal effort. no tachypnea, no wheezing. Abd:                 No distention.  Soft nontender Other:           Left anterior chest: Skin is intact, no tenderness to palpation.  Tenderness with deep breath and movement in the seventh rib with left medial clavicular line.  No depression. Upper extremities: Full ROM but limited by pain in her left  anterior chest.    Lower extremities: Presence of old erythema in her knees and lower extremities, patient is using compression socks.  No tenderness with flexion or extension of the legs. ED Results / Procedures / Treatments   Labs (all labs ordered are listed, but only abnormal results are displayed) Labs Reviewed - No data to display    RADIOLOGY I independently reviewed and interpreted imaging and agree with radiologists findings.      PROCEDURES:  Critical Care performed:   Procedures   MEDICATIONS ORDERED IN ED: Medications  lidocaine  (LIDODERM ) 5 % 1 patch (1 patch Transdermal Patch Applied 11/16/23 1707)  acetaminophen  (TYLENOL ) tablet 650 mg (650 mg Oral Given 11/16/23 1648)   Clinical Course as of 11/16/23 1753  Wed Nov 16, 2023  1604 DG Ribs Unilateral W/Chest Left . Suspected minimally displaced left anterior seventh rib fracture. Please correlate with site of patient's pain. 2. Increased left basilar density consistent with consolidation and effusion.   [AE]  1605 Large partially calcified mass /pseudotumor posterior to the odontoid process, eccentric to the left, with spinal cord displacement, chronic posterior osseous erosive change of C2, and severe spinal stenosis grossly stable as compared with the most recent prior imaging . 2. Multilevel degenerative spondylosis with disc space narrowing and osteophytes; multilevel canal stenosis and foraminal narrowing. 3. No acute osseous abnormality   [AE]  1605 CT Head Wo Contrast . Negative for acute intracranial hemorrhage. 2. Acute to Subacute appearing infarct in the medial left occipital lobe. 3. Severe chronic microvascular ischemic changes. 4. Soft tissue component exerting mass effect on the craniocervical junction. See separately dictated cervical CT.   [AE]    Clinical Course User Index [AE] Janit Kast, PA-C    IMPRESSION / MDM / ASSESSMENT AND PLAN / ED COURSE  I reviewed the triage  vital signs and the nursing notes.  Differential diagnosis includes, but is not limited to, fall, intracranial hemorrhage, rib fracture, cervical fracture, soft tissue injury, stroke.  Patient's presentation is most consistent with acute complicated illness / injury requiring diagnostic workup.   Gloria Carr is a 89 y.o., female who presents today with history of mechanical fall this morning without loss of consciousness.  On a physical exam vital signs are stable, patient is oriented x 4.  Head atraumatic, PERRLA, neck full ROM.  Cardiopulmonary is clear.  Left anterior chest, there is presence of  pain with deep breath or movement at the level of the seventh rib with left medial clavicular line.  No depressions.  Abdomen is normal.  Presence of erythema on her knees and lower extremities. Per independent chart review patient was seen here for mechanical falls on September 15, September 19, October 03, 2023. Plan CT of the head, neck CT, rib x-ray results Tylenol  650 Lidocaine  patch Patient's diagnosis is consistent with fall, left seventh anterior rib fracture mild displaced, acute or subacute infarct of the medial left occipital lobe.  Dr. Arlander  talked  with her son about the diagnosis of acute or subacute left occipital lobe, son decided patient can be discharged with a neurology follow-up.  Patient is going to be discharged with acetaminophen  and lidocaine  patch.  Patient will have a follow-up with Dr. Lane neurology.  I did answer patient's and her son's questions.  Patient and son verbalized understanding.   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   Janit Kast, PA-C 11/16/23 1652    Janit Kast, PA-C 11/16/23 1749    Janit Kast, PA-C 11/16/23 1754    Janit Kast, PA-C 11/16/23 1756    Janit Kast, PA-C 11/16/23 1757    Arlander [place], MD 11/16/23 PERVIS

## 2023-11-16 NOTE — Discharge Instructions (Addendum)
 You have been diagnosed with mechanical fall, closed fracture of the 7 left rib.  Please take acetaminophen  1 tablet by mouth every 8 hours as needed for pain.  Please apply 1 lidocaine  patch in the right side of your chest every 12 hours.  Please call and make an appointment with neurology for follow-up.  Please come back to ED or go to your PCP if you have new symptoms or symptoms worsen.

## 2023-11-16 NOTE — ED Triage Notes (Signed)
 Patient to ED via POV (lives at [facility]) for a mechanical fall. Pt reports losing her balance falling back wards. C/o left ribs and back. States she hit her head. Denies LOC or blood thinners. AOx4

## 2023-11-16 NOTE — ED Notes (Signed)
 Attempted to call the [facility].

## 2023-11-17 DIAGNOSIS — R296 Repeated falls: Secondary | ICD-10-CM | POA: Diagnosis not present

## 2023-11-17 DIAGNOSIS — R2681 Unsteadiness on feet: Secondary | ICD-10-CM | POA: Diagnosis not present

## 2023-11-17 DIAGNOSIS — R2689 Other abnormalities of gait and mobility: Secondary | ICD-10-CM | POA: Diagnosis not present

## 2023-11-17 DIAGNOSIS — I4891 Unspecified atrial fibrillation: Secondary | ICD-10-CM | POA: Diagnosis not present

## 2023-11-17 DIAGNOSIS — M6281 Muscle weakness (generalized): Secondary | ICD-10-CM | POA: Diagnosis not present

## 2023-11-18 ENCOUNTER — Emergency Department

## 2023-11-18 ENCOUNTER — Other Ambulatory Visit: Payer: Self-pay

## 2023-11-18 ENCOUNTER — Emergency Department
Admission: EM | Admit: 2023-11-18 | Discharge: 2023-11-19 | Disposition: A | Discharge status: Home / Self Care | Attending: Emergency Medicine | Admitting: Emergency Medicine

## 2023-11-18 DIAGNOSIS — I10 Essential (primary) hypertension: Secondary | ICD-10-CM | POA: Diagnosis not present

## 2023-11-18 DIAGNOSIS — W19XXXA Unspecified fall, initial encounter: Secondary | ICD-10-CM | POA: Diagnosis not present

## 2023-11-18 DIAGNOSIS — G319 Degenerative disease of nervous system, unspecified: Secondary | ICD-10-CM | POA: Diagnosis not present

## 2023-11-18 DIAGNOSIS — S0990XA Unspecified injury of head, initial encounter: Secondary | ICD-10-CM | POA: Insufficient documentation

## 2023-11-18 DIAGNOSIS — R519 Headache, unspecified: Secondary | ICD-10-CM | POA: Diagnosis not present

## 2023-11-18 DIAGNOSIS — I6782 Cerebral ischemia: Secondary | ICD-10-CM | POA: Diagnosis not present

## 2023-11-18 LAB — COMPREHENSIVE METABOLIC PANEL WITH GFR
ALT: 19 U/L (ref 0–44)
AST: 27 U/L (ref 15–41)
Albumin: 3.2 g/dL — ABNORMAL LOW (ref 3.5–5.0)
Alkaline Phosphatase: 103 U/L (ref 38–126)
Anion gap: 9 (ref 5–15)
BUN: 21 mg/dL (ref 8–23)
CO2: 24 mmol/L (ref 22–32)
Calcium: 8.7 mg/dL — ABNORMAL LOW (ref 8.9–10.3)
Chloride: 104 mmol/L (ref 98–111)
Creatinine, Ser: 0.7 mg/dL (ref 0.44–1.00)
GFR, Estimated: >60 mL/min (ref >60)
Glucose, Bld: 169 mg/dL — ABNORMAL HIGH (ref 70–99)
Potassium: 4.3 mmol/L (ref 3.5–5.1)
Sodium: 137 mmol/L (ref 135–145)
Total Bilirubin: 1 mg/dL (ref 0.0–1.2)
Total Protein: 6.5 g/dL (ref 6.5–8.1)

## 2023-11-18 LAB — CBC WITH DIFFERENTIAL/PLATELET
Abs Immature Granulocytes: 0.05 K/uL (ref 0.00–0.07)
Basophils Absolute: 0.1 K/uL (ref 0.0–0.1)
Basophils Relative: 1 %
Eosinophils Absolute: 0.1 K/uL (ref 0.0–0.5)
Eosinophils Relative: 1 %
HCT: 38.8 % (ref 36.0–46.0)
Hemoglobin: 11.9 g/dL — ABNORMAL LOW (ref 12.0–15.0)
Immature Granulocytes: 1 %
Lymphocytes Relative: 20 %
Lymphs Abs: 1.6 K/uL (ref 0.7–4.0)
MCH: 29.3 pg (ref 26.0–34.0)
MCHC: 30.7 g/dL (ref 30.0–36.0)
MCV: 95.6 fL (ref 80.0–100.0)
Monocytes Absolute: 1.1 K/uL — ABNORMAL HIGH (ref 0.1–1.0)
Monocytes Relative: 14 %
Neutro Abs: 5.1 K/uL (ref 1.7–7.7)
Neutrophils Relative %: 63 %
Platelets: 342 K/uL (ref 150–400)
RBC: 4.06 MIL/uL (ref 3.87–5.11)
RDW: 17.3 % — ABNORMAL HIGH (ref 11.5–15.5)
WBC: 8 K/uL (ref 4.0–10.5)
nRBC: 0 % (ref 0.0–0.2)

## 2023-11-18 NOTE — Discharge Instructions (Signed)
 You were evaluated in the ED following a fall.  Your physical exam findings are reassuring.  Your head CT and cervical spine CT revealed no acute life-threatening injuries or illnesses.  Your lab work is reassuring.  Please use your walker to assist with walking to prevent falls in the future.  Follow-up with your primary care provider as needed.

## 2023-11-18 NOTE — ED Notes (Signed)
 Called [facility] to arrange transport team should arrive between 0015-0030

## 2023-11-18 NOTE — ED Provider Notes (Signed)
 [facility] Emergency Department Provider Note     Event Date/Time   First MD Initiated Contact with Patient 11/18/23 2059     (approximate)   History   Fall   HPI  Gloria Carr is a 89 y.o. female with a past medical history of arthritis, HTN presents to the ED following a fall.  Patient reports she was trying to get around furniture in her living space without her walker and tripped over her carpet.  She reports hitting her head on the floor.  Denies LOC.  No anticoagulation use.  Denies urinary symptoms.  Patient is asymptomatic during my evaluation.    Physical Exam   Triage Vital Signs: ED Triage Vitals  Encounter Vitals Group     BP 11/18/23 2036 (!) 153/81     Girls Systolic BP Percentile --      Girls Diastolic BP Percentile --      Boys Systolic BP Percentile --      Boys Diastolic BP Percentile --      Pulse Rate 11/18/23 2036 75     Resp 11/18/23 2036 16     Temp 11/18/23 2036 (!) 97.5 F (36.4 C)     Temp Source 11/18/23 2036 Oral     SpO2 11/18/23 2036 98 %     Weight 11/18/23 2033 83 lb 12.4 oz (38 kg)     Height 11/18/23 2033 4' 8 (1.422 m)     Head Circumference --      Peak Flow --      Pain Score 11/18/23 2033 9     Pain Loc --      Pain Education --      Exclude from Growth Chart --     Most recent vital signs: Vitals:   11/18/23 2036  BP: (!) 153/81  Pulse: 75  Resp: 16  Temp: (!) 97.5 F (36.4 C)  SpO2: 98%   General: Well appearing and comfortable. Alert and oriented. INAD.  Skin:  Warm, dry and intact. No rashes or lesions noted.     Head:  NCAT.  Eyes:  PERRLA. EOMI.  Ears:  No postauricular ecchymosis  Neck:   Midline cervical spinous tenderness to palpation.  Full range of motion without difficulty.   CV:  Good peripheral perfusion. RRR. No peripheral edema.  RESP:  Normal effort. LCTAB.  ABD:  No distention. Soft, Non tender.  MSK:   Full ROM in all joints. No swelling, deformity or tenderness.   NEURO: Cranial nerves II-XII intact. No focal deficits. Speech is clear. Sensation and motor function intact. Normal muscle strength of UE & LE.   ED Results / Procedures / Treatments   Labs (all labs ordered are listed, but only abnormal results are displayed) Labs Reviewed  CBC WITH DIFFERENTIAL/PLATELET - Abnormal; Notable for the following components:      Result Value   Hemoglobin 11.9 (*)    RDW 17.3 (*)    Monocytes Absolute 1.1 (*)    All other components within normal limits  COMPREHENSIVE METABOLIC PANEL WITH GFR - Abnormal; Notable for the following components:   Glucose, Bld 169 (*)    Calcium  8.7 (*)    Albumin 3.2 (*)    All other components within normal limits   RADIOLOGY  I personally viewed and evaluated these images as part of my medical decision making, as well as reviewing the written report by the radiologist.  CT HEAD WO CONTRAST ( ) Result Date: 11/18/2023  CLINICAL DATA:  Unwitnessed fall EXAM: CT HEAD WITHOUT CONTRAST CT CERVICAL SPINE WITHOUT CONTRAST TECHNIQUE: Multidetector CT imaging of the head and cervical spine was performed following the standard protocol without intravenous contrast. Multiplanar CT image reconstructions of the cervical spine were also generated. RADIATION DOSE REDUCTION: This exam was performed according to the departmental dose-optimization program which includes automated exposure control, adjustment of the mA and/or kV according to patient size and/or use of iterative reconstruction technique. COMPARISON:  CT 10/03/2023, 11/16/2023 FINDINGS: CT HEAD FINDINGS Brain: Negative for acute intracranial hemorrhage. Atrophy and moderate severe chronic small vessel ischemic changes of the white matter. Chronic small right cerebellar infarct. Age indeterminate left occipital infarct but new compared with October 03, 2023. No significant mass effect. Stable ventricular size. Vascular: No hyperdense vessels.  Carotid vascular calcification  Skull: No fracture Sinuses/Orbits: No acute finding. Other: None CT CERVICAL SPINE FINDINGS Alignment: Straightening the cervical spine. Trace retrolisthesis C4 on C5 and trace anterolisthesis C5 on C6 and C7 on T1. Facet alignment is normal Skull base and vertebrae: Vertebral body heights are maintained. No fracture. Chronic erosive changes of the dens, associated with large partially calcified mass posterior to the odontoid process. Soft tissues and spinal canal: Redemonstrated chronic partially calcified mass posterior to the odontoid process, eccentric to the left with mass effect and displacement of the cord to the right. Disc levels: Severe diffuse degenerative changes with disc space narrowing and osteophytes. Posterior disc osteophyte complex at C3-C4 with at least mild canal stenosis, and at C4-C5 with moderate severe canal stenosis. Multilevel facet degenerative changes with multilevel foraminal stenosis, severe bilaterally at C3-C4 and C4-C5. Upper chest: Pleural effusions. Other: None IMPRESSION: 1. Negative for acute intracranial hemorrhage. Atrophy and chronic small vessel ischemic changes of the white matter. Age indeterminate left occipital infarct, new compared with October 03, 2023 and stable compared with CT several days prior. 2. Straightening of the cervical spine with severe degenerative changes. No acute osseous abnormality. 3. Redemonstrated chronic partially calcified mass posterior to the odontoid process with chronic osseous erosion of C2 and severe spinal stenosis with cord displacement posterior and to the right. Advanced multilevel degenerative changes elsewhere in the cervical spine Electronically Signed   By: Luke Bun M.D.   On: 11/18/2023 22:20   CT Cervical Spine Wo Contrast Result Date: 11/18/2023 CLINICAL DATA:  Unwitnessed fall EXAM: CT HEAD WITHOUT CONTRAST CT CERVICAL SPINE WITHOUT CONTRAST TECHNIQUE: Multidetector CT imaging of the head and cervical spine was  performed following the standard protocol without intravenous contrast. Multiplanar CT image reconstructions of the cervical spine were also generated. RADIATION DOSE REDUCTION: This exam was performed according to the departmental dose-optimization program which includes automated exposure control, adjustment of the mA and/or kV according to patient size and/or use of iterative reconstruction technique. COMPARISON:  CT 10/03/2023, 11/16/2023 FINDINGS: CT HEAD FINDINGS Brain: Negative for acute intracranial hemorrhage. Atrophy and moderate severe chronic small vessel ischemic changes of the white matter. Chronic small right cerebellar infarct. Age indeterminate left occipital infarct but new compared with October 03, 2023. No significant mass effect. Stable ventricular size. Vascular: No hyperdense vessels.  Carotid vascular calcification Skull: No fracture Sinuses/Orbits: No acute finding. Other: None CT CERVICAL SPINE FINDINGS Alignment: Straightening the cervical spine. Trace retrolisthesis C4 on C5 and trace anterolisthesis C5 on C6 and C7 on T1. Facet alignment is normal Skull base and vertebrae: Vertebral body heights are maintained. No fracture. Chronic erosive changes of the dens, associated with large partially calcified mass  posterior to the odontoid process. Soft tissues and spinal canal: Redemonstrated chronic partially calcified mass posterior to the odontoid process, eccentric to the left with mass effect and displacement of the cord to the right. Disc levels: Severe diffuse degenerative changes with disc space narrowing and osteophytes. Posterior disc osteophyte complex at C3-C4 with at least mild canal stenosis, and at C4-C5 with moderate severe canal stenosis. Multilevel facet degenerative changes with multilevel foraminal stenosis, severe bilaterally at C3-C4 and C4-C5. Upper chest: Pleural effusions. Other: None IMPRESSION: 1. Negative for acute intracranial hemorrhage. Atrophy and chronic small  vessel ischemic changes of the white matter. Age indeterminate left occipital infarct, new compared with October 03, 2023 and stable compared with CT several days prior. 2. Straightening of the cervical spine with severe degenerative changes. No acute osseous abnormality. 3. Redemonstrated chronic partially calcified mass posterior to the odontoid process with chronic osseous erosion of C2 and severe spinal stenosis with cord displacement posterior and to the right. Advanced multilevel degenerative changes elsewhere in the cervical spine Electronically Signed   By: Luke Bun M.D.   On: 11/18/2023 22:20   PROCEDURES:  Critical Care performed: No  Procedures  MEDICATIONS ORDERED IN ED: Medications - No data to display  IMPRESSION / MDM / ASSESSMENT AND PLAN / ED COURSE  I reviewed the triage vital signs and the nursing notes.                               89 y.o. female presents to the emergency department for evaluation and treatment of mechanical fall. See HPI for further details.   Differential diagnosis includes, but is not limited to closed head injury, fracture, dislocation, subdural/epidural hematoma, concussion, contusion, hematoma considered but less likely subarachnoid hemorrhage   Patient's presentation is most consistent with acute complicated illness / injury requiring diagnostic workup.  Patient is alert and oriented.  Physical exam findings are stated above.  Normal neuroexam.  Lab work obtained in triage which is reassuring.  Head CT and cervical spine CT are also reassuring.  Patient stable condition for discharge home.  Education provided for patient to continue using walker for assisted ambulation. Advised to follow-up with primary care provider in 1 week.   FINAL CLINICAL IMPRESSION(S) / ED DIAGNOSES   Final diagnoses:  Fall, initial encounter   Rx / DC Orders   ED Discharge Orders     None       Note:  This document was prepared using Dragon voice  recognition software and may include unintentional dictation errors.    Margrette, Shiya Fogelman A, PA-C 11/18/23 2257    Jacolyn Pae, MD 11/18/23 2312

## 2023-11-18 NOTE — ED Triage Notes (Signed)
 Patient brought in via [facility] from [facility] due to unwitnessed fall. Patient states she fell when trying to walk backwards to maneuver around furniture. She states she hit her head on the floor, denies LOC, no thinners.

## 2023-11-19 DIAGNOSIS — Z7401 Bed confinement status: Secondary | ICD-10-CM | POA: Diagnosis not present

## 2023-11-19 DIAGNOSIS — I10 Essential (primary) hypertension: Secondary | ICD-10-CM | POA: Diagnosis not present

## 2023-11-21 DIAGNOSIS — R2681 Unsteadiness on feet: Secondary | ICD-10-CM | POA: Diagnosis not present

## 2023-11-21 DIAGNOSIS — R2689 Other abnormalities of gait and mobility: Secondary | ICD-10-CM | POA: Diagnosis not present

## 2023-11-21 DIAGNOSIS — M6281 Muscle weakness (generalized): Secondary | ICD-10-CM | POA: Diagnosis not present

## 2023-11-21 DIAGNOSIS — I4891 Unspecified atrial fibrillation: Secondary | ICD-10-CM | POA: Diagnosis not present

## 2023-11-21 DIAGNOSIS — R296 Repeated falls: Secondary | ICD-10-CM | POA: Diagnosis not present

## 2023-11-25 DIAGNOSIS — M6281 Muscle weakness (generalized): Secondary | ICD-10-CM | POA: Diagnosis not present

## 2023-11-25 DIAGNOSIS — R296 Repeated falls: Secondary | ICD-10-CM | POA: Diagnosis not present

## 2023-11-25 DIAGNOSIS — R2681 Unsteadiness on feet: Secondary | ICD-10-CM | POA: Diagnosis not present

## 2023-11-25 DIAGNOSIS — R2689 Other abnormalities of gait and mobility: Secondary | ICD-10-CM | POA: Diagnosis not present

## 2023-11-25 DIAGNOSIS — I4891 Unspecified atrial fibrillation: Secondary | ICD-10-CM | POA: Diagnosis not present

## 2023-11-28 DIAGNOSIS — R2681 Unsteadiness on feet: Secondary | ICD-10-CM | POA: Diagnosis not present

## 2023-11-28 DIAGNOSIS — R296 Repeated falls: Secondary | ICD-10-CM | POA: Diagnosis not present

## 2023-11-28 DIAGNOSIS — I4891 Unspecified atrial fibrillation: Secondary | ICD-10-CM | POA: Diagnosis not present

## 2023-11-28 DIAGNOSIS — R2689 Other abnormalities of gait and mobility: Secondary | ICD-10-CM | POA: Diagnosis not present

## 2023-11-28 DIAGNOSIS — R279 Unspecified lack of coordination: Secondary | ICD-10-CM | POA: Diagnosis not present

## 2023-11-28 DIAGNOSIS — R41841 Cognitive communication deficit: Secondary | ICD-10-CM | POA: Diagnosis not present

## 2023-11-28 DIAGNOSIS — Z741 Need for assistance with personal care: Secondary | ICD-10-CM | POA: Diagnosis not present

## 2023-11-28 DIAGNOSIS — S2232XD Fracture of one rib, left side, subsequent encounter for fracture with routine healing: Secondary | ICD-10-CM | POA: Diagnosis not present

## 2023-11-29 DIAGNOSIS — R2689 Other abnormalities of gait and mobility: Secondary | ICD-10-CM | POA: Diagnosis not present

## 2023-11-29 DIAGNOSIS — I4891 Unspecified atrial fibrillation: Secondary | ICD-10-CM | POA: Diagnosis not present

## 2023-11-29 DIAGNOSIS — G6289 Other specified polyneuropathies: Secondary | ICD-10-CM | POA: Diagnosis not present

## 2023-11-29 DIAGNOSIS — M4802 Spinal stenosis, cervical region: Secondary | ICD-10-CM | POA: Diagnosis not present

## 2023-11-29 DIAGNOSIS — M5412 Radiculopathy, cervical region: Secondary | ICD-10-CM | POA: Diagnosis not present

## 2023-11-29 DIAGNOSIS — R296 Repeated falls: Secondary | ICD-10-CM | POA: Diagnosis not present

## 2023-12-02 ENCOUNTER — Encounter: Payer: Self-pay | Admitting: Internal Medicine

## 2023-12-02 ENCOUNTER — Telehealth: Admitting: Internal Medicine

## 2023-12-02 VITALS — BP 135/70 | Ht <= 58 in | Wt 83.8 lb

## 2023-12-02 DIAGNOSIS — M48061 Spinal stenosis, lumbar region without neurogenic claudication: Secondary | ICD-10-CM

## 2023-12-02 DIAGNOSIS — R296 Repeated falls: Secondary | ICD-10-CM

## 2023-12-02 DIAGNOSIS — Z7401 Bed confinement status: Secondary | ICD-10-CM | POA: Diagnosis not present

## 2023-12-02 NOTE — Progress Notes (Unsigned)
 Virtual Visit via Caregility   Note   This format is felt to be most appropriate for this patient at this time.  All issues noted in this document were discussed and addressed.  No physical exam was performed (except for noted visual exam findings with Video Visits).   I connected with Gloria Carr  on 12/02/23 at  5:00 PM EST by a video enabled telemedicine application  and verified that I am speaking with the correct person using two identifiers. Location patient: home Location provider: work or home office Persons participating in the virtual visit: patient, provider  I discussed the limitations, risks, security and privacy concerns of performing an evaluation and management service by telephone and the availability of in person appointments. I also discussed with the patient that there may be a patient responsible charge related to this service. The patient expressed understanding and agreed to proceed.  Reason for visit: ER follow up  , multiple concerns  HPI:  89 yr old female with lumbar spinal stenosis , hypertension,  glaucoma and osteoporosis recently treated in ER for blunt head trauma after striking head on floor during a fall at home  CT HEAD FINDINGS Brain: Negative for acute intracranial hemorrhage. Atrophy and moderate severe chronic small vessel ischemic changes of the white matter. Chronic small right cerebellar infarct. Age indeterminate left occipital infarct but new compared with October 03, 2023. No significant mass effect. Stable ventricular size. Vascular: No hyperdense vessels. 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    ROS: See pertinent positives and negatives per HPI.  Past Medical History:  Diagnosis Date   Allergy     Arthritis    Closed fracture of left superior pubic ramus, with routine healing, subsequent encounter 08/24/2016   Community acquired pneumonia of left lower lobe of lung 05/21/2023   COVID-19 virus infection 04/05/2022   E. coli UTI 04/08/2022   Glaucoma    Glaucoma    History of chickenpox    History of recurrent UTIs    Hypertension    Macular degeneration    Spinal stenosis    Thyroid  disease     Past Surgical History:  Procedure Laterality Date   BILATERAL CARPAL TUNNEL RELEASE  1999-2000   CATARACT EXTRACTION, BILATERAL  2013   COLONOSCOPY N/A 05/25/2023   Procedure: COLONOSCOPY;  Surgeon: Jinny Carmine, MD;  Location: [facility] ENDOSCOPY;  Service: Endoscopy;  Laterality: N/A;    Family History  Problem Relation Age of Onset   Stroke Mother    Cancer Mother 20       colon   Cancer Father        bone   Cancer Son        Jaw   Stroke Sister    Breast cancer Neg Hx     SOCIAL HX: ***   Current Outpatient Medications:    acetaminophen  (TYLENOL ) 500 MG tablet, Take 1 tablet (500 mg total) by mouth every 6 (six) hours as needed., Disp: 30 tablet, Rfl: 0   Cholecalciferol (VITAMIN D3) 50 MCG (2000 UT) capsule, Take 2,000 Units by mouth daily., Disp: , Rfl:    cyanocobalamin  (VITAMIN B12) 1000 MCG tablet, Take 1,000 mcg by mouth daily., Disp: , Rfl:    denosumab -bbdz 60 MG/ML SOSY, Inject 60 mg into the skin every 6 (  six) months., Disp: 1 mL, Rfl: 1   diclofenac Sodium (VOLTAREN) 1 % GEL, Apply 2 g topically as needed (pain)., Disp: , Rfl:    furosemide  (LASIX ) 20 MG tablet, Take 1 tablet (20 mg total) by mouth every other day. For fluid retention /weight gain, Disp: 45 tablet, Rfl: 2   latanoprost (XALATAN) 0.005 % ophthalmic solution, Place 1 drop into the left eye at bedtime., Disp: , Rfl: 5   levothyroxine  (SYNTHROID ) 50 MCG tablet, TAKE 1 TABLET EVERY DAY ON EMPTY STOMACHWITH A GLASS OF WATER AT LEAST 30-60 MINBEFORE BREAKFAST, Disp: 90 tablet, Rfl: 1   metoprolol   succinate (TOPROL  XL) 25 MG 24 hr tablet, Take 1 tablet (25 mg total) by mouth daily., Disp: 90 tablet, Rfl: 3   Multiple Vitamins-Minerals (PRESERVISION AREDS 2 PO), Take by mouth., Disp: , Rfl:    Propylene Glycol (SYSTANE BALANCE OP), Apply to eye., Disp: , Rfl:   Current Facility-Administered Medications:    [START ON 04/25/2024] denosumab -bbdz (JUBBONTI ) injection 60 mg, 60 mg, Subcutaneous, Once, Marylynn, Verneita CROME, MD  EXAM:  VITALS per patient if applicable:  GENERAL: alert, oriented, appears well and in no acute distress  HEENT: atraumatic, conjunttiva clear, no obvious abnormalities on inspection of external nose and ears  NECK: normal movements of the head and neck  LUNGS: on inspection no signs of respiratory distress, breathing rate appears normal, no obvious gross SOB, gasping or wheezing  CV: no obvious cyanosis  MS: moves all visible extremities without noticeable abnormality  PSYCH/NEURO: pleasant and cooperative, no obvious depression or anxiety, speech and thought processing grossly intact  ASSESSMENT AND PLAN: There are no diagnoses linked to this encounter.    I discussed the assessment and treatment plan with the patient. The patient was provided an opportunity to ask questions and all were answered. The patient agreed with the plan and demonstrated an understanding of the instructions.   The patient was advised to call back or seek an in-person evaluation if the symptoms worsen or if the condition fails to improve as anticipated.   I spent 30 minutes dedicated to the care of this patient on the date of this encounter to include pre-visit review of patient's medical history,  including recent ER visit, imaging studies and labs, face-to-face time with the patient , and post visit ordering of testing and therapeutics.    Verneita CROME Marylynn, MD

## 2023-12-02 NOTE — Patient Instructions (Signed)
 Preventing PRESSURE ULCERS (bedsores) is KEY.   The more time Gloria Carr spends in bed without getting up,  the more likely this can happen to the following body parts:  Spine Tailbone Heels   At the first sign of redness on any bony prominence,  you should ask the staff how we can get her a KINN AIR OR CIRCULATING AIR MATTRESS   I WOULD GET A GEL PAD FOR HER CHAIR. THIS COULD ALSO BE PLACED UNDER HER BOTTOM WHEN SHE IS IN   BED

## 2023-12-04 DIAGNOSIS — Z7401 Bed confinement status: Secondary | ICD-10-CM | POA: Insufficient documentation

## 2023-12-04 NOTE — Assessment & Plan Note (Signed)
 She has had 3 falls in the last month despite use of rolling walker due to loss of balance and frequent PT.  She has cervical and lumbar spinal stenosis with chronic radicular pain to both legs, and peripheral neuropathy . 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  Lab Results  Component Value Date   VITAMINB12 411 10/12/2023

## 2023-12-04 NOTE — Assessment & Plan Note (Addendum)
 She has chronic pain and decreased balance with leg weaknessthat has not been responding to orthopedic treatments  Awaiting evaluation by referral  [facility]

## 2023-12-21 ENCOUNTER — Telehealth: Payer: Self-pay

## 2023-12-21 DIAGNOSIS — M5380 Other specified dorsopathies, site unspecified: Secondary | ICD-10-CM | POA: Diagnosis not present

## 2023-12-21 DIAGNOSIS — M4802 Spinal stenosis, cervical region: Secondary | ICD-10-CM | POA: Diagnosis not present

## 2023-12-21 DIAGNOSIS — M5412 Radiculopathy, cervical region: Secondary | ICD-10-CM | POA: Diagnosis not present

## 2023-12-21 NOTE — Telephone Encounter (Signed)
 Notified Whitney of Brooke's response and to let us  know if the patient is now interested in seeing an MD to discuss surgery.

## 2023-12-21 NOTE — Telephone Encounter (Signed)
 I received a call from Benton Dowse, NP about this patient. She saw this patient today and her strength is now 4-. Her cognition has declined to where she had a difficult time following commands. She is having a hard time feeding herself (possibly due to weakness) and she cannot feel her fingertips. She has fallen a few times over the past several months (most recently in November). She is in a facility now. Benton is wondering if they should follow up with our office due to the change in her symptoms. She is unsure if the patient would consider surgery at this time (or if it would be an option due to her decline), but felt she should reach out to us  to discuss.

## 2024-01-17 ENCOUNTER — Ambulatory Visit (INDEPENDENT_AMBULATORY_CARE_PROVIDER_SITE_OTHER): Admitting: *Deleted

## 2024-01-17 VITALS — BP 124/62 | HR 63 | Ht <= 58 in | Wt 85.0 lb

## 2024-01-17 DIAGNOSIS — Z0000 Encounter for general adult medical examination without abnormal findings: Secondary | ICD-10-CM

## 2024-01-17 NOTE — Patient Instructions (Addendum)
 [name],  Thank you for taking the time for your Medicare Wellness Visit. I appreciate your continued commitment to your health goals. Please review the care plan we discussed, and feel free to reach out if I can assist you further.  Please note that Annual Wellness Visits do not include a physical exam. Some assessments may be limited, especially if the visit was conducted virtually. If needed, we may recommend an in-person follow-up with your provider.  Ongoing Care Seeing your primary care provider every 3 to 6 months helps us  monitor your health and provide consistent, personalized care.   Unable to find dates of recent flu or covid vaccine on the [place] Immunization Registry, if you are able to get the dates please send dates thru my chart so we can update our records.   Referrals If a referral was made during today's visit and you haven't received any updates within two weeks, please contact the referred provider directly to check on the status.  Recommended Screenings:  Health Maintenance  Topic Date Due   COVID-19 Vaccine (12 - 2025-26 season) 09/12/2023   Flu Shot  04/10/2024*   Medicare Annual Wellness Visit  01/16/2025   DTaP/Tdap/Td vaccine (4 - Td or Tdap) 12/10/2025   Pneumococcal Vaccine for age over 61  Completed   Osteoporosis screening with Bone Density Scan  Completed   Zoster (Shingles) Vaccine  Completed   Meningitis B Vaccine  Aged Out  *Topic was postponed. The date shown is not the original due date.       01/17/2024    1:16 PM  Advanced Directives  Does Patient Have a Medical Advance Directive? Yes  Type of Estate Agent of [place];Living will  Does patient want to make changes to medical advance directive? No - Patient declined  Copy of Healthcare Power of Attorney in Chart? Yes - validated most recent copy scanned in chart (See row information)    Vision: Annual vision screenings are recommended for early detection of glaucoma,  cataracts, and diabetic retinopathy. These exams can also reveal signs of chronic conditions such as diabetes and high blood pressure.  Dental: Annual dental screenings help detect early signs of oral cancer, gum disease, and other conditions linked to overall health, including heart disease and diabetes.  Please see the attached documents for additional preventive care recommendations.

## 2024-01-17 NOTE — Progress Notes (Signed)
 "  Chief Complaint  Patient presents with   Medicare Wellness     Subjective:   Gloria Carr is a 89 y.o. female who presents for a Medicare Annual Wellness Visit.  Visit info / Clinical Intake: Medicare Wellness Visit Type:: Subsequent Annual Wellness Visit Persons participating in visit and providing information:: patient & caregiver (and son Velva) Medicare Wellness Visit Mode:: Video Since this visit was completed virtually, some vitals may be partially provided or unavailable. Missing vitals are due to the limitations of the virtual format.: Documented vitals are patient reported If Telephone or Video please confirm:: I connected with patient using audio/video enable telemedicine. I verified patient identity with two identifiers, discussed telehealth limitations, and patient agreed to proceed. Patient Location:: [place]/nursing facility Provider Location:: Office/Home Interpreter Needed?: No Pre-visit prep was completed: yes AWV questionnaire completed by patient prior to visit?: no Living arrangements:: in nursing facility Patient's Overall Health Status Rating: (!) fair Typical amount of pain: some Does pain affect daily life?: (!) yes Are you currently prescribed opioids?: no  Dietary Habits and Nutritional Risks How many meals a day?: 3 Eats fruit and vegetables daily?: yes Most meals are obtained by: having others provide food In the last 2 weeks, have you had any of the following?: none Diabetic:: no  Functional Status Activities of Daily Living (to include ambulation/medication): (!) Needs Assist Feeding: Needs assistance Dressing/Grooming: Needs assistance Bathing: Needs assistance Toileting: Needs assistance Transfer: Needs assistance Ambulation: Independent with device- listed below Home Assistive Devices/Equipment: Wheelchair Medication Administration: Needs assistance (comment) Home Management (perform basic housework or laundry):  Dependent Manage your own finances?: (!) no Primary transportation is: facility / other Concerns about vision?: (!) yes (macular degenerative) Concerns about hearing?: no  Fall Screening Falls in the past year?: 1 Number of falls in past year: 1 Was there an injury with Fall?: 0 (was checked out with each fall) Fall Risk Category Calculator: 2 Patient Fall Risk Level: Moderate Fall Risk  Fall Risk Patient at Risk for Falls Due to: History of fall(s); Impaired balance/gait Fall risk Follow up: Falls evaluation completed; Education provided; Falls prevention discussed  Home and Transportation Safety: All rugs have non-skid backing?: N/A, no rugs All stairs or steps have railings?: N/A, no stairs Grab bars in the bathtub or shower?: yes Have non-skid surface in bathtub or shower?: yes Good home lighting?: yes Regular seat belt use?: yes Hospital stays in the last year:: (!) yes How many hospital stays:: 1 Reason: Shortness of breathe  Cognitive Assessment Difficulty concentrating, remembering, or making decisions? : yes Will 6CIT or Mini Cog be Completed: yes What year is it?: 0 points What month is it?: 0 points Give patient an address phrase to remember (5 components): [address] [place] About what time is it?: 0 points Count backwards from 20 to 1: 0 points Say the months of the year in reverse: 0 points Repeat the address phrase from earlier: 4 points 6 CIT Score: 4 points  Advance Directives (For Healthcare) Does Patient Have a Medical Advance Directive?: Yes Does patient want to make changes to medical advance directive?: No - Patient declined Type of Advance Directive: Healthcare Power of [place]; Living will Copy of Healthcare Power of Attorney in Chart?: Yes - validated most recent copy scanned in chart (See row information) Copy of Living Will in Chart?: Yes - validated most recent copy scanned in chart (See row information) Would patient like information  on creating a medical advance directive?: No -  Patient declined  Reviewed/Updated  Reviewed/Updated: Reviewed All (Medical, Surgical, Family, Medications, Allergies, Care Teams, Patient Goals)    Allergies (verified) Sulfa antibiotics   Current Medications (verified) Outpatient Encounter Medications as of 01/17/2024  Medication Sig   acetaminophen  (TYLENOL ) 500 MG tablet Take 1 tablet (500 mg total) by mouth every 6 (six) hours as needed.   Cholecalciferol (VITAMIN D3) 50 MCG (2000 UT) capsule Take 2,000 Units by mouth daily.   cyanocobalamin  (VITAMIN B12) 1000 MCG tablet Take 1,000 mcg by mouth daily.   denosumab -bbdz 60 MG/ML SOSY Inject 60 mg into the skin every 6 (six) months.   diclofenac Sodium (VOLTAREN) 1 % GEL Apply 2 g topically as needed (pain).   furosemide  (LASIX ) 20 MG tablet Take 1 tablet (20 mg total) by mouth every other day. For fluid retention /weight gain   latanoprost (XALATAN) 0.005 % ophthalmic solution Place 1 drop into the left eye at bedtime.   levothyroxine  (SYNTHROID ) 50 MCG tablet TAKE 1 TABLET EVERY DAY ON EMPTY STOMACHWITH A GLASS OF WATER AT LEAST 30-60 MINBEFORE BREAKFAST   metoprolol  succinate (TOPROL  XL) 25 MG 24 hr tablet Take 1 tablet (25 mg total) by mouth daily.   Multiple Vitamins-Minerals (PRESERVISION AREDS 2 PO) Take by mouth.   pregabalin (LYRICA) 25 MG capsule Take 25 mg by mouth 2 (two) times daily.   Propylene Glycol (SYSTANE BALANCE OP) Apply to eye.   Facility-Administered Encounter Medications as of 01/17/2024  Medication   [START ON 04/25/2024] denosumab -bbdz (JUBBONTI ) injection 60 mg    History: Past Medical History:  Diagnosis Date   Allergy    Arthritis    Closed fracture of left superior pubic ramus, with routine healing, subsequent encounter 08/24/2016   Community acquired pneumonia of left lower lobe of lung 05/21/2023   COVID-19 virus infection 04/05/2022   E. coli UTI 04/08/2022   Glaucoma    Glaucoma    History of  chickenpox    History of recurrent UTIs    Hypertension    Macular degeneration    Spinal stenosis    Thyroid  disease    Past Surgical History:  Procedure Laterality Date   BILATERAL CARPAL TUNNEL RELEASE  1999-2000   CATARACT EXTRACTION, BILATERAL  2013   COLONOSCOPY N/A 05/25/2023   Procedure: COLONOSCOPY;  Surgeon: Jinny Carmine, MD;  Location: [facility] ENDOSCOPY;  Service: Endoscopy;  Laterality: N/A;   Family History  Problem Relation Age of Onset   Stroke Mother    Cancer Mother 56       colon   Cancer Father        bone   Cancer Son        Jaw   Stroke Sister    Breast cancer Neg Hx    Social History   Occupational History   Not on file  Tobacco Use   Smoking status: Former   Smokeless tobacco: Never  Vaping Use   Vaping status: Never Used  Substance and Sexual Activity   Alcohol use: Yes    Alcohol/week: 5.0 standard drinks of alcohol    Types: 5 Standard drinks or equivalent per week    Comment: with dinner   Drug use: No   Sexual activity: Not Currently    Birth control/protection: Post-menopausal   Tobacco Counseling Counseling given: Not Answered  SDOH Screenings   Food Insecurity: No Food Insecurity (01/17/2024)  Housing: Unknown (01/17/2024)  Transportation Needs: No Transportation Needs (01/17/2024)  Utilities: Not At Risk (01/17/2024)  Alcohol Screen: Low Risk (01/17/2024)  Depression (PHQ2-9): Low Risk (01/17/2024)  Financial Resource Strain: Low Risk (01/17/2024)  Physical Activity: Inactive (01/17/2024)  Social Connections: Moderately Isolated (01/17/2024)  Stress: No Stress Concern Present (01/17/2024)  Tobacco Use: Medium Risk (01/17/2024)  Health Literacy: Adequate Health Literacy (01/17/2024)   See flowsheets for full screening details  Depression Screen PHQ 2 & 9 Depression Scale- Over the past 2 weeks, how often have you been bothered by any of the following problems? Little interest or pleasure in doing things: 0 Feeling down, depressed, or hopeless (PHQ  Adolescent also includes...irritable): 0 PHQ-2 Total Score: 0 Trouble falling or staying asleep, or sleeping too much: 0 Feeling tired or having little energy: 2 Poor appetite or overeating (PHQ Adolescent also includes...weight loss): 0 Feeling bad about yourself - or that you are a failure or have let yourself or your family down: 0 Trouble concentrating on things, such as reading the newspaper or watching television (PHQ Adolescent also includes...like school work): 0 Moving or speaking so slowly that other people could have noticed. Or the opposite - being so fidgety or restless that you have been moving around a lot more than usual: 0 Thoughts that you would be better off dead, or of hurting yourself in some way: 0 PHQ-9 Total Score: 2 If you checked off any problems, how difficult have these problems made it for you to do your work, take care of things at home, or get along with other people?: Very difficult     Goals Addressed             This Visit's Progress    Patient Stated       Would like to dance again and live longer             Objective:    Today's Vitals   01/17/24 1304  BP: 124/62  Pulse: 63  Weight: 85 lb (38.6 kg)  Height: 4' 8 (1.422 m)   Body mass index is 19.06 kg/m.  Hearing/Vision screen Hearing Screening - Comments:: No issues Vision Screening - Comments:: Glasses, [facility], up to date Immunizations and Health Maintenance Health Maintenance  Topic Date Due   COVID-19 Vaccine (12 - 2025-26 season) 09/12/2023   Influenza Vaccine  04/10/2024 (Originally 08/12/2023)   Medicare Annual Wellness (AWV)  01/16/2025   DTaP/Tdap/Td (4 - Td or Tdap) 12/10/2025   Pneumococcal Vaccine: 50+ Years  Completed   Bone Density Scan  Completed   Zoster Vaccines- Shingrix  Completed   Meningococcal B Vaccine  Aged Out        Assessment/Plan:  This is a routine wellness examination for [place].  Patient Care Team: Marylynn Verneita CROME, MD as PCP -  General (Internal Medicine) Perla Evalene PARAS, MD as PCP - Cardiology (Cardiology) Cindie Ole DASEN, MD as PCP - Electrophysiology (Cardiology) Lane Arthea BRAVO, MD as Referring Physician (Neurology)  I have personally reviewed and noted the following in the patients chart:   Medical and social history Use of alcohol, tobacco or illicit drugs  Current medications and supplements including opioid prescriptions. Functional ability and status Nutritional status Physical activity Advanced directives List of other physicians Hospitalizations, surgeries, and ER visits in previous 12 months Vitals Screenings to include cognitive, depression, and falls Referrals and appointments  No orders of the defined types were placed in this encounter.  In addition, I have reviewed and discussed with patient certain preventive protocols, quality metrics, and best practice recommendations. A written personalized care plan for preventive services as well as  general preventive health recommendations were provided to patient.   Angeline Fredericks, LPN   08/14/7971   Return in 1 year (on 01/16/2025).  After Visit Summary: (MyChart) Due to this being a telephonic visit, the after visit summary with patients personalized plan was offered to patient via MyChart   Nurse Notes: Patient stated that she had covid and flu vaccines this season at her facility. Unable to find records of a recent covid or flu vaccine on [facility].  "

## 2024-01-30 NOTE — Progress Notes (Signed)
 Gloria Carr is a 89 y.o. female seen for follow up visit at [facility]  CHIEF COMPLAINT:  Follow up medical problems    HISTORY OF PRESENT ILLNESS:     Spinal stenosis of cervical region Continues to have pain and weakness in the left arm, felt most likely from her inoperable cervical spinal stenosis and c2-3 mass. Physiatry and neurosurgery have been following. Seems in tolerant to gabapentin and lyrica of late. Calm and smiling this am though confused. Tramadol  prn has been ordered, unclear if she has gotten/needed as yet  Please note that I cannot get further history or review of systems as the patient's very limited verbal ability.    Past Medical History:  Diagnosis Date   Arthritis    CHF (congestive heart failure) (CMS/HHS-HCC)    Closed fracture of left superior pubic ramus (CMS/HHS-HCC)    COPD (chronic obstructive pulmonary disease) (CMS/HHS-HCC)    GERD (gastroesophageal reflux disease)    Glaucoma    History of chickenpox    History of recurrent UTIs    Hypertension    Hypothyroidism    Macular degeneration    no vision in right eye   Neuropathy    Osteoporosis    Overactive bladder    Pure hypercholesterolemia    Vitamin D  deficiency     Past Surgical History:  Procedure Laterality Date   CATARACT EXTRACTION Bilateral 2013   ENDOSCOPIC CARPAL TUNNEL RELEASE Bilateral 1999-2000      Social History   Socioeconomic History   Marital status: Widowed  Tobacco Use   Smoking status: Former    Current packs/day: 0.00    Types: Cigarettes    Quit date: 1960    Years since quitting: 66.0   Smokeless tobacco: Never  Vaping Use   Vaping status: Never Used  Substance and Sexual Activity   Alcohol use: Yes    Comment: 1 glass of wine or gin & tonic per day   Drug use: Not Currently   Sexual activity: Not Currently   Social Drivers of Health   Financial Resource Strain: Low Risk (01/17/2024)   Received from [facility]    Overall Financial Resource Strain (CARDIA)    How hard is it for you to pay for the very basics like food, housing, medical care, and heating?: Not hard at all  Food Insecurity: No Food Insecurity (01/17/2024)   Received from [facility]   Hunger Vital Sign    Within the past 12 months, you worried that your food would run out before you got the money to buy more.: Never true    Within the past 12 months, the food you bought just didn't last and you didn't have money to get more.: Never true  Transportation Needs: No Transportation Needs (01/17/2024)   Received from [facility] - [facility]    In the past 12 months, has lack of transportation kept you from medical appointments or from getting medications?: No    In the past 12 months, has lack of transportation kept you from meetings, work, or from getting things needed for daily living?: No  Physical Activity: Inactive (01/17/2024)   Received from [facility]   Exercise Vital Sign    On average, how many days per week do you engage in moderate to strenuous exercise (like a brisk walk)?: 0 days    On average, how many minutes do you engage in exercise at this level?: 0 min  Stress: No Stress Concern Present (01/17/2024)  Received from [facility] - Occupational Stress Questionnaire    Do you feel stress - tense, restless, nervous, or anxious, or unable to sleep at night because your mind is troubled all the time - these days?: Only a little  Social Connections: Moderately Isolated (01/17/2024)   Received from [facility]   Social Connection and Isolation Panel    In a typical week, how many times do you talk on the phone with family, friends, or neighbors?: More than three times a week    How often do you get together with friends or relatives?: Twice a week    How often do you attend church or religious services?: More than 4 times per year    Do you belong to any clubs or  organizations such as church groups, unions, fraternal or athletic groups, or school groups?: No    How often do you attend meetings of the clubs or organizations you belong to?: Never    Are you married, widowed, divorced, separated, never married, or living with a partner?: Widowed  Housing Stability: Low Risk  (09/30/2023)   Housing Stability Vital Sign    Unable to Pay for Housing in the Last Year: No    Number of Times Moved in the Last Year: 0    Homeless in the Last Year: No     Medication reviewed and signed on the nursing facility chart as appropriate Vital Sign Recent Value Date Time Weight: 97.3 01/12/2024 08:45 Blood Pressure: 175 /  105 01/26/2024 18:49 Temperature: 98.1 01/26/2024 15:56 Pulse: 61 01/26/2024 15:56 Respirations: 16 01/26/2024 15:56 Blood Sugar:    O2 sats: 95.0 % 01/26/2024 15:56 Height: 57.0 11/29/2023 13:52 Pain Level: 4 01/18/2024 05:12  No acute distress No icterus  No JVD Lungs; clear to ascultation Heart; Regular rate and rhythm  Abdomen; Soft and flat, normal bowel sounds Neuro; Difficult with confusion but left arm is very weak as best I can tell this am  Extremities; No clubbing, cyanosis or edema  Available labs reviewed in the nursing home chart.   ASSESSMENT  AND PLAN: Diagnoses and all orders for this visit:  Spinal stenosis of cervical region Assessment & Plan: Continues to have pain and weakness in the left arm, felt most likely from her inoperable cervical spinal stenosis and c2-3 mass. Physiatry and neurosurgery have been following. Seems in tolerant to gabapentin and lyrica of late. Calm and smiling this am though confused. Tramadol  prn has been ordered, unclear if she has gotten/needed as yet     All problems listed above are stable and will be followed except at noted otherwise. This patient is a complicated patient requiring skilled nursing care and multiple medications and multiple medical problems which need and will be  monitored. Decompensation remains a risk and the medication regimen will be reordered as needed and adjusted. I have reordered all the medications as is required. Aggressive care is not indicated given illnesses and advanced age. Elevated bp is not an issue that needs aggressive treatment given age and frailty and concomitant medical issues     MEDICARE WELLNESS VISIT   PROVIDERS RENDERING CARE Dr. Lenon,   FUNCTIONAL ASSESSMENT  (1) Hearing: Hearing is decreased but functional (2) Risk of Falls: Activity and any attempted gait is watched closely by facility staff (3) Home Safety; Nursing home is safe and secure (4) Activities of Daily Living; Full general care and ADL help provided at the patients skilled facility  DEPRESSION  SCREENING No clear depression symptoms such as crying spells or blue mood  COGNITIVE SCREENING Memory and cognitive dysfunction is noted    PREVENTION PLAN Aggressive screening and preventive strategies will not be helpful given disease burden and life expectancy  Pneumonia: Per facility protocol. Influenza: Yearly flu vaccine  Smoking Cessation: NA   OTHER PERSONALIZED HEALTH ADVISE Weight sustaining diet and activity with staffs health as tolerated including social activities as feasible.   END OF LIFE CARE WANTS No code given overall health is warranted       Layman Piety MD

## 2024-02-17 ENCOUNTER — Other Ambulatory Visit: Payer: Self-pay | Admitting: Internal Medicine

## 2025-01-21 ENCOUNTER — Ambulatory Visit
